# Patient Record
Sex: Male | Born: 1975
Health system: Southern US, Community
[De-identification: ages and names within clinical notes are randomized; demographics above are authoritative.]

## PROBLEM LIST (undated history)

## (undated) DIAGNOSIS — Z803 Family history of malignant neoplasm of breast: Secondary | ICD-10-CM

## (undated) DIAGNOSIS — N35919 Unspecified urethral stricture, male, unspecified site: Secondary | ICD-10-CM

## (undated) DIAGNOSIS — E7800 Pure hypercholesterolemia, unspecified: Secondary | ICD-10-CM

## (undated) DIAGNOSIS — E119 Type 2 diabetes mellitus without complications: Secondary | ICD-10-CM

## (undated) DIAGNOSIS — I10 Essential (primary) hypertension: Secondary | ICD-10-CM

## (undated) DIAGNOSIS — K219 Gastro-esophageal reflux disease without esophagitis: Secondary | ICD-10-CM

## (undated) DIAGNOSIS — Z87442 Personal history of urinary calculi: Secondary | ICD-10-CM

## (undated) HISTORY — PX: WISDOM TOOTH EXTRACTION: SHX21

## (undated) HISTORY — PX: OSTOMY: SHX5997

## (undated) HISTORY — DX: Pure hypercholesterolemia, unspecified: E78.00

## (undated) HISTORY — DX: Essential (primary) hypertension: I10

## (undated) HISTORY — DX: Family history of malignant neoplasm of breast: Z80.3

## (undated) HISTORY — PX: URETHRAL DILATION: SUR417

## (undated) HISTORY — DX: Gastro-esophageal reflux disease without esophagitis: K21.9

---

## 2002-01-16 ENCOUNTER — Emergency Department (HOSPITAL_COMMUNITY): Admission: EM | Admit: 2002-01-16 | Discharge: 2002-01-16 | Payer: Self-pay | Admitting: Internal Medicine

## 2012-03-20 ENCOUNTER — Other Ambulatory Visit: Payer: Self-pay | Admitting: Urology

## 2012-03-27 ENCOUNTER — Encounter (HOSPITAL_BASED_OUTPATIENT_CLINIC_OR_DEPARTMENT_OTHER): Payer: Self-pay | Admitting: *Deleted

## 2012-03-27 NOTE — Progress Notes (Signed)
Pt instructed npo p mn 12 29.  To [facility] 12/30 @ 0615.  Needs hgb on arrival

## 2012-04-06 ENCOUNTER — Encounter (HOSPITAL_BASED_OUTPATIENT_CLINIC_OR_DEPARTMENT_OTHER): Payer: Self-pay | Admitting: *Deleted

## 2012-04-06 ENCOUNTER — Encounter (HOSPITAL_BASED_OUTPATIENT_CLINIC_OR_DEPARTMENT_OTHER): Payer: Self-pay | Admitting: Anesthesiology

## 2012-04-06 ENCOUNTER — Ambulatory Visit (HOSPITAL_BASED_OUTPATIENT_CLINIC_OR_DEPARTMENT_OTHER)
Admission: RE | Admit: 2012-04-06 | Discharge: 2012-04-06 | Disposition: A | Discharge status: Home / Self Care | Payer: BC Managed Care – PPO | Source: Ambulatory Visit | Attending: Urology | Admitting: Urology

## 2012-04-06 ENCOUNTER — Encounter (HOSPITAL_BASED_OUTPATIENT_CLINIC_OR_DEPARTMENT_OTHER)
Admission: RE | Disposition: A | Discharge status: Home / Self Care | Payer: Self-pay | Source: Ambulatory Visit | Attending: Urology

## 2012-04-06 ENCOUNTER — Ambulatory Visit (HOSPITAL_BASED_OUTPATIENT_CLINIC_OR_DEPARTMENT_OTHER): Payer: BC Managed Care – PPO | Admitting: Anesthesiology

## 2012-04-06 DIAGNOSIS — N480 Leukoplakia of penis: Secondary | ICD-10-CM | POA: Insufficient documentation

## 2012-04-06 DIAGNOSIS — N35919 Unspecified urethral stricture, male, unspecified site: Secondary | ICD-10-CM | POA: Insufficient documentation

## 2012-04-06 HISTORY — DX: Unspecified urethral stricture, male, unspecified site: N35.919

## 2012-04-06 HISTORY — PX: BALLOON DILATION: SHX5330

## 2012-04-06 LAB — POCT I-STAT, CHEM 8
Calcium, Ion: 1.29 mmol/L — ABNORMAL HIGH (ref 1.12–1.23)
Creatinine, Ser: 0.9 mg/dL (ref 0.50–1.35)
Glucose, Bld: 118 mg/dL — ABNORMAL HIGH (ref 70–99)
HCT: 47 % (ref 39.0–52.0)
Hemoglobin: 16 g/dL (ref 13.0–17.0)

## 2012-04-06 SURGERY — CYSTOSCOPY WITH RETROGRADE URETHROGRAM
Anesthesia: General | Site: Urethra | Wound class: Clean Contaminated

## 2012-04-06 MED ORDER — FENTANYL CITRATE 0.05 MG/ML IJ SOLN
25.0000 ug | INTRAMUSCULAR | Status: DC | PRN
  Filled 2012-04-06: qty 1

## 2012-04-06 MED ORDER — LIDOCAINE HCL (CARDIAC) 20 MG/ML IV SOLN
INTRAVENOUS | Status: DC | PRN
  Administered 2012-04-06: 80 mg via INTRAVENOUS

## 2012-04-06 MED ORDER — MEPERIDINE HCL 25 MG/ML IJ SOLN
6.2500 mg | INTRAMUSCULAR | Status: DC | PRN
  Filled 2012-04-06: qty 1

## 2012-04-06 MED ORDER — GENTAMICIN SULFATE 40 MG/ML IJ SOLN
440.0000 mg | Freq: Once | INTRAVENOUS | Status: AC
  Administered 2012-04-06: 440 mg via INTRAVENOUS
  Filled 2012-04-06 (×2): qty 11

## 2012-04-06 MED ORDER — LACTATED RINGERS IV SOLN
INTRAVENOUS | Status: DC
Start: 2012-04-06 — End: 2012-04-06
  Administered 2012-04-06: 07:00:00 via INTRAVENOUS
  Filled 2012-04-06: qty 1000

## 2012-04-06 MED ORDER — HYDROCODONE-ACETAMINOPHEN 5-500 MG PO TABS: 1.0000 | ORAL_TABLET | Freq: Four times a day (QID) | ORAL | Status: DC | PRN

## 2012-04-06 MED ORDER — KETOROLAC TROMETHAMINE 30 MG/ML IJ SOLN
INTRAMUSCULAR | Status: DC | PRN
  Administered 2012-04-06: 30 mg via INTRAVENOUS

## 2012-04-06 MED ORDER — DEXAMETHASONE SODIUM PHOSPHATE 4 MG/ML IJ SOLN
INTRAMUSCULAR | Status: DC | PRN
  Administered 2012-04-06: 10 mg via INTRAVENOUS

## 2012-04-06 MED ORDER — MIDAZOLAM HCL 5 MG/5ML IJ SOLN
INTRAMUSCULAR | Status: DC | PRN
  Administered 2012-04-06: 2 mg via INTRAVENOUS

## 2012-04-06 MED ORDER — LACTATED RINGERS IV SOLN
INTRAVENOUS | Status: DC
  Filled 2012-04-06: qty 1000

## 2012-04-06 MED ORDER — FENTANYL CITRATE 0.05 MG/ML IJ SOLN
INTRAMUSCULAR | Status: DC | PRN
  Administered 2012-04-06 (×2): 50 ug via INTRAVENOUS

## 2012-04-06 MED ORDER — ACETAMINOPHEN 10 MG/ML IV SOLN
INTRAVENOUS | Status: DC | PRN
  Administered 2012-04-06: 1000 mg via INTRAVENOUS

## 2012-04-06 MED ORDER — GENTAMICIN IN SALINE 1.6-0.9 MG/ML-% IV SOLN
80.0000 mg | INTRAVENOUS | Status: DC
  Filled 2012-04-06: qty 50

## 2012-04-06 MED ORDER — STERILE WATER FOR IRRIGATION IR SOLN
Status: DC | PRN
  Administered 2012-04-06: 3000 mL via INTRAVESICAL

## 2012-04-06 MED ORDER — CIPROFLOXACIN HCL 250 MG PO TABS: 250.0000 mg | ORAL_TABLET | Freq: Two times a day (BID) | ORAL | Status: DC

## 2012-04-06 MED ORDER — PROPOFOL 10 MG/ML IV BOLUS
INTRAVENOUS | Status: DC | PRN
  Administered 2012-04-06: 200 mg via INTRAVENOUS

## 2012-04-06 MED ORDER — IOHEXOL 350 MG/ML SOLN
INTRAVENOUS | Status: DC | PRN
  Administered 2012-04-06: 20 mL

## 2012-04-06 MED ORDER — ONDANSETRON HCL 4 MG/2ML IJ SOLN
INTRAMUSCULAR | Status: DC | PRN
  Administered 2012-04-06: 4 mg via INTRAVENOUS

## 2012-04-06 MED ORDER — PROMETHAZINE HCL 25 MG/ML IJ SOLN
6.2500 mg | INTRAMUSCULAR | Status: DC | PRN
  Filled 2012-04-06: qty 1

## 2012-04-06 SURGICAL SUPPLY — 24 items
ADAPTER CATH URET PLST 4-6FR (CATHETERS) IMPLANT
ADPR CATH URET STRL DISP 4-6FR (CATHETERS)
BAG DRAIN URO-CYSTO SKYTR STRL (DRAIN) ×3 IMPLANT
BAG DRN UROCATH (DRAIN) ×2
BALLN NEPHROSTOMY (BALLOONS) ×3
BALLOON NEPHROSTOMY (BALLOONS) ×1 IMPLANT
CANISTER SUCT LVC 12 LTR MEDI- (MISCELLANEOUS) ×2 IMPLANT
CATH INTERMIT  6FR 70CM (CATHETERS) ×3 IMPLANT
CATH ROBINSON RED A/P 14FR (CATHETERS) ×3 IMPLANT
CATH URET 5FR 28IN CONE TIP (BALLOONS)
CATH URET 5FR 70CM CONE TIP (BALLOONS) IMPLANT
CLOTH BEACON ORANGE TIMEOUT ST (SAFETY) ×3 IMPLANT
DRAPE CAMERA CLOSED 9X96 (DRAPES) ×3 IMPLANT
GLOVE BIO SURGEON STRL SZ7 (GLOVE) ×2 IMPLANT
GLOVE BIO SURGEON STRL SZ7.5 (GLOVE) ×3 IMPLANT
GLOVE ECLIPSE 7.0 STRL STRAW (GLOVE) ×1 IMPLANT
GLOVE INDICATOR 7.5 STRL GRN (GLOVE) ×2 IMPLANT
GOWN PREVENTION PLUS LG XLONG (DISPOSABLE) ×4 IMPLANT
GOWN STRL REIN XL XLG (GOWN DISPOSABLE) ×3 IMPLANT
GUIDEWIRE STR DUAL SENSOR (WIRE) ×3 IMPLANT
NS IRRIG 500ML POUR BTL (IV SOLUTION) IMPLANT
PACK CYSTOSCOPY (CUSTOM PROCEDURE TRAY) ×3 IMPLANT
SYRINGE IRR TOOMEY STRL 70CC (SYRINGE) IMPLANT
WATER STERILE IRR 3000ML UROMA (IV SOLUTION) ×3 IMPLANT

## 2012-04-06 NOTE — Anesthesia Procedure Notes (Signed)
Procedure Name: LMA Insertion Date/Time: 04/06/2012 7:44 AM Performed by: Maris Berger T Pre-anesthesia Checklist: Patient identified, Emergency Drugs available, Suction available and Patient being monitored Patient Re-evaluated:Patient Re-evaluated prior to inductionOxygen Delivery Method: Circle System Utilized Preoxygenation: Pre-oxygenation with 100% oxygen Intubation Type: IV induction Ventilation: Mask ventilation without difficulty LMA: LMA inserted LMA Size: 5.0 Number of attempts: 1 Airway Equipment and Method: bite block Placement Confirmation: positive ETCO2 Dental Injury: Teeth and Oropharynx as per pre-operative assessment

## 2012-04-06 NOTE — H&P (Signed)
History of Present Illness   Joel Jefferson is a 36 year old gentleman who in the last 4 or 5 days has been having difficulty initiating urination. It hurts after he urinates near the tip of the penis. He is having some spraying of urination. It also hurts to start.  A few months ago, his flow was good and now it is reasonable, but he does spray. He does feel empty. He says normally he drinks a lot of fluids and voids every 2 hours and gets up 3-4 times a night and denies ankle edema. He is continent.   He denies a history of kidney stones, previous GU surgery and urinary tract infections.  He has no neurologic risk factors or symptoms. His bowel function is normal. He was given ciprofloxacin.  I was consulted by Dr. Dwana Melena and he felt he had meatal stenosis.  The symptoms are moderate in severity and persisting. It came on fairly acutely.    Current Meds 1. Anucort-HC 25 MG Rectal Suppository; Therapy: 11Dec2013 to 2. Ciprofloxacin HCl 250 MG Oral Tablet; Therapy: 11Dec2013 to  Allergies Medication  1. No Known Drug Allergies  Family History Problems  1. Maternal history of  Diabetes Mellitus V18.0 2. Maternal history of  Hypertension V17.49  Social History Problems    Alcohol Use   Caffeine Use   Former Smoker V15.82   Marital History - Currently Married   Occupation: EMT  Review of Systems Constitutional, skin, eye, otolaryngeal, hematologic/lymphatic, cardiovascular, pulmonary, endocrine, musculoskeletal, gastrointestinal, neurological and psychiatric system(s) were reviewed and pertinent findings if present are noted.  Genitourinary: initiating urination requires straining.    Vitals Vital Signs [Data Includes: Last 1 Day]  13Dec2013 09:21AM  BMI Calculated: 34.36 BSA Calculated: 2.26 Height: 5 ft 10 in Weight: 240 lb  Blood Pressure: 149 / 101 Heart Rate: 87 Respiration: 16  Physical Exam Constitutional: Well nourished and well developed . No  acute distress.  ENT:. The ears and nose are normal in appearance.  Neck: The appearance of the neck is normal and no neck mass is present.  Pulmonary: No respiratory distress and normal respiratory rhythm and effort.  Cardiovascular: Heart rate and rhythm are normal . No peripheral edema.  Abdomen: The abdomen is soft and nontender. No masses are palpated. No CVA tenderness. No hernias are palpable. No hepatosplenomegaly noted.  Lymphatics: The femoral and inguinal nodes are not enlarged or tender.  Skin: Normal skin turgor, no visible rash and no visible skin lesions.  Neuro/Psych:. Mood and affect are appropriate.   . Genitourinary: On physical examination, Mr. Zeitz was circumcised.  He had meatal stenosis.  He had whitish tissue around the urethral meatus that he says has been present for 3-4 months.    Results/Data  20 Mar 2012 8:58 AM   UA With REFLEX       COLOR YELLOW       APPEARANCE CLEAR       SPECIFIC GRAVITY 1.020       pH 6.5       GLUCOSE NEG       BILIRUBIN NEG       KETONE NEG       BLOOD NEG       PROTEIN NEG       UROBILINOGEN 0.2       NITRITE NEG       LEUKOCYTE ESTERASE NEG     Today Mr. Thorpe underwent a number of tests, which I personally reviewed.   Urinalysis:  Negative.  Bladder Scan: Bladder scan residual was 36 mL.    Assessment Assessed  1. Urethral Stricture 598.9 2. Urinary Stream Is Smaller 788.62  Plan   Discussion/Summary   I agree with Dr. Margo Aye that Mr. Hawkes has symptomatic meatal stenosis. This probably is an acute on-chronic process. He may have a distal urethral stricture proximal to the meatal stenosis. His nocturia is out of the ordinary for his age, but he does drink a lot of fluids. 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  I drew him a picture. I have recommended a cystoscopy and a retrograde using a 6 [place] open end urethral catheter. I will then perform balloon dilation.  We talked about balloon dilation in detail.  Pros, cons, general surgical and anesthetic risks, and other options including watchful waiting were discussed. He understands that dilation is generally successful in most cases but long-term success rates are low. We talked about the risk of persistent, de novo, or worsening incontinence and voiding dysfunction. Risks were described but not limited to the discussion of injury to neighboring structures and soft tissues. Bleeding, infection, pain, erectile dysfunction, and spraying of urination were discussed. The risk of neuropathy was discussed as well as the usual post-operative course.  Splitting of the glans at the meatus was discussed and long-term spraying of urination was discussed.  I am going to send a copy of my note to Dr. Dwana Melena to keep him updated on his treatment course.  cc: Dwana Melena, MD   I will do my best to leave Mr. Poland without a catheter. We talked about the natural history of strictures. In the long term, I will talk about prevention. He works 24-hour shifts. If he had a catheter, he likely would not be able to work for 1-2 days afterwards.   I am going to send a copy of my note to Dr. Dwana Melena to keep him updated on his treatment course.  After a thorough review of the management options for the patient's condition the patient  elected to proceed with surgical therapy as noted above. We have discussed the potential benefits and risks of the procedure, side effects of the proposed treatment, the likelihood of the patient achieving the goals of the procedure, and any potential problems that might occur during the procedure or recuperation. Informed consent has been obtained.

## 2012-04-06 NOTE — Anesthesia Preprocedure Evaluation (Signed)
Anesthesia Evaluation  Patient identified by MRN, date of birth, ID band Patient awake    Reviewed: Allergy & Precautions, H&P , NPO status , Patient's Chart, lab work & pertinent test results  Airway Mallampati: II TM Distance: >3 FB Neck ROM: full    Dental No notable dental hx.    Pulmonary neg pulmonary ROS,  breath sounds clear to auscultation  Pulmonary exam normal       Cardiovascular Exercise Tolerance: Good negative cardio ROS  Rhythm:regular Rate:Normal     Neuro/Psych negative neurological ROS  negative psych ROS   GI/Hepatic negative GI ROS, Neg liver ROS,   Endo/Other  negative endocrine ROS  Renal/GU negative Renal ROS  negative genitourinary   Musculoskeletal   Abdominal   Peds  Hematology negative hematology ROS (+)   Anesthesia Other Findings   Reproductive/Obstetrics negative OB ROS                           Anesthesia Physical Anesthesia Plan  ASA: I  Anesthesia Plan: General LMA   Post-op Pain Management:    Induction:   Airway Management Planned:   Additional Equipment:   Intra-op Plan:   Post-operative Plan:   Informed Consent: I have reviewed the patients History and Physical, chart, labs and discussed the procedure including the risks, benefits and alternatives for the proposed anesthesia with the patient or authorized representative who has indicated his/her understanding and acceptance.   Dental Advisory Given  Plan Discussed with: CRNA  Anesthesia Plan Comments:         Anesthesia Quick Evaluation

## 2012-04-06 NOTE — Op Note (Signed)
Preoperative diagnosis: Meatal stenosis Postoperative diagnosis: Meatal stenosis and mild BXO Surgery: Cystoscopy retrograde urethrogram and balloon dilation of stricture Surgeon: Dr. Lorin Picket Darlina Mccaughey  The patient consented the above procedure and preoperative antibiotics. He had approximate 8 French meatal stenosis with a small rim of white tissue in keeping with BXO around the meatus  I initially did a retrograde urethrogram with a well-lubricated a one-inch 6 French ureteral catheter.  Retrograde ureterogram it was normal caliber he penile and bulbar urethra I injected approximately 10 cc of contrast with minimal reaching the bladder through the external sphincter. It was done in the AP position and lithotomy position with the penis drawn to the patient's left side there was no extravasation or urethral stricture  Under fluoroscopic guidance I easily passed a sensor wire curling into the bladder. I passed a well-prepared balloon dilation catheter to the mid urethra and dilated for 5 minutes at 18 atmospheres of pressure with the usual technique  The balloon was deflated and removed  With a 17 [place] scope I easily entered the bladder. Bladder mucosa and trigone were normal. Is no stitch or foreign body or carcinoma. One could argue he may have had a little bit of a high riding bladder neck no was minimal.  The prosthetic urethra and membranous urethra were normal. Bulbar urethra normal. Penile urethra was normal with no stricture disease. The meatus was wide open. There may have been 1 or 2 mm of BXO migrating into the urethra distally.  There was no distortion or splitting of the meatus.  There was no bleeding. Patient was left without a catheter. Hopefully the procedure will reach the patient's treatment goal

## 2012-04-06 NOTE — Transfer of Care (Signed)
Immediate Anesthesia Transfer of Care Note  Patient: Joel Jefferson  Procedure(s) Performed: Procedure(s) (LRB) with comments: CYSTOSCOPY WITH RETROGRADE URETHROGRAM (N/A) BALLOON DILATION (N/A)  Patient Location: PACU  Anesthesia Type:General  Level of Consciousness: awake, alert  and oriented  Airway & Oxygen Therapy: Patient Spontanous Breathing and Patient connected to nasal cannula oxygen  Post-op Assessment: Report given to PACU RN  Post vital signs: Reviewed and stable  Complications: No apparent anesthesia complications

## 2012-04-06 NOTE — Progress Notes (Signed)
Dr Sherron Monday in to see pt as ordered prior to pt D/C

## 2012-04-06 NOTE — Progress Notes (Signed)
Dr Sherron Monday paged to see pt prior to D/C

## 2012-04-06 NOTE — Interval H&P Note (Signed)
History and Physical Interval Note:  04/06/2012 7:33 AM  Joel Jefferson  has presented today for surgery, with the diagnosis of URETHERAL STRICTURE  The various methods of treatment have been discussed with the patient and family. After consideration of risks, benefits and other options for treatment, the patient has consented to  Procedure(s) (LRB) with comments: CYSTOSCOPY WITH RETROGRADE URETHROGRAM (N/A) BALLOON DILATION (N/A) as a surgical intervention .  The patient's history has been reviewed, patient examined, no change in status, stable for surgery.  I have reviewed the patient's chart and labs.  Questions were answered to the patient's satisfaction.     Dwayne Begay A

## 2012-04-06 NOTE — Anesthesia Postprocedure Evaluation (Signed)
  Anesthesia Post-op Note  Patient: Joel Jefferson  Procedure(s) Performed: Procedure(s) (LRB): CYSTOSCOPY WITH RETROGRADE URETHROGRAM (N/A) BALLOON DILATION (N/A)  Patient Location: PACU  Anesthesia Type: General  Level of Consciousness: awake and alert   Airway and Oxygen Therapy: Patient Spontanous Breathing  Post-op Pain: mild  Post-op Assessment: Post-op Vital signs reviewed, Patient's Cardiovascular Status Stable, Respiratory Function Stable, Patent Airway and No signs of Nausea or vomiting  Last Vitals:  Filed Vitals:   04/06/12 0845  BP: 131/89  Pulse: 80  Temp:   Resp: 14    Post-op Vital Signs: stable   Complications: No apparent anesthesia complications

## 2012-04-07 ENCOUNTER — Encounter (HOSPITAL_BASED_OUTPATIENT_CLINIC_OR_DEPARTMENT_OTHER): Payer: Self-pay | Admitting: Urology

## 2012-10-26 ENCOUNTER — Ambulatory Visit (INDEPENDENT_AMBULATORY_CARE_PROVIDER_SITE_OTHER): Payer: BC Managed Care – PPO | Admitting: Psychology

## 2012-10-26 DIAGNOSIS — F411 Generalized anxiety disorder: Secondary | ICD-10-CM

## 2012-11-12 ENCOUNTER — Encounter (HOSPITAL_COMMUNITY): Payer: Self-pay | Admitting: Psychology

## 2012-11-12 ENCOUNTER — Ambulatory Visit (INDEPENDENT_AMBULATORY_CARE_PROVIDER_SITE_OTHER): Payer: BC Managed Care – PPO | Admitting: Psychology

## 2012-11-12 DIAGNOSIS — F411 Generalized anxiety disorder: Secondary | ICD-10-CM

## 2012-11-12 NOTE — Progress Notes (Signed)
Patient:   Joel Jefferson.   DOB:   10/09/1975  MR Number:  [id number]  Location:  BEHAVIORAL [facility] [address] [place] [address] Dept: [phone]           Date of Service:   10/26/2012  Start Time:   3 PM End Time:   4 PM  Provider/Observer:  Hershal Coria PSYD       Billing Code/Service: [phone]  Chief Complaint:     Chief Complaint  Patient presents with  . Stress  . Agitation    Reason for Service:  The patient reports that he is feeling more anger and stress issues lately. The patient reports that he is major stressors right now have to do with his mothers help her situation and difficulty within his marriage. The patient reports he has had difficulty with anger and stress and mood changes for years and anxiety maybe a major issue with him. The patient has had some obsessive-compulsive symptoms in the past as well as excessive worrying. The patient reports that more recently as a stress is increased that there've been increasing marital difficulties to the point that he and his wife is separated. The patient reports he is always had trouble with anger. The patient reports that he is/and at a friend's which caused a lot of difficulty and this is been worsening over the past 6 months. 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 The oldest is a 37 year old male with ADHD and has been in trouble with the law causes a also a lot of stress between the patient and his wife.  Current Status:  The patient has been feeling more stressed anger recently and this is led to a separation from he and his wife. He is not sure whether this marriage will survive the situation. The patient reports that obsessive-compulsive symptoms and also been present in the past  and he has had difficulty with anxiety and worry for a long time but over the past 6 or 7 months symptoms of worse.  Reliability of Information: Information provided by the patient as well as review of available medical records.  Behavioral Observation: JUANDIEGO KOLENOVIC.  presents as a 37 y.o.-year-old Right Caucasian Male who appeared his stated age. his dress was Appropriate and he was Well Groomed and his manners were Appropriate to the situation.  There were not any physical disabilities noted.  he displayed an appropriate level of cooperation and motivation.    Interactions:    Active   Attention:   within normal limits  Memory:   within normal limits  Visuo-spatial:   within normal limits  Speech (Volume):  normal  Speech:   normal pitch and normal volume  Thought Process:  Coherent  Though Content:  WNL  Orientation:   person, place, time/date and situation  Judgment:   Good  Planning:   Good  Affect:    Irritable  Mood:    Anxious  Insight:   Good  Intelligence:   normal  Marital Status/Living: The patient was born and raised in [place]. He reports he grew up in a traditional family were his mother didn't primary racing of the patient because his father was always working. He  has a 46 year old sister. His father is 67 years old and in fair health and his mother is 24 years old and in poor health. He does see them often. They're no major issues with family members. The patient does have military experience and was in the [employer] between 1995 and 1999. He never saw active combat. After he received his honorable discharge he got married and started his family. 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 The patient is in his second marriage and has 3 children all males age 37, 91, and 51.  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  Substance Use:  No concerns of substance abuse are reported.    Education:   HS Graduate  Medical History:   Past Medical History  Diagnosis Date  . Urethral stricture         Outpatient Encounter Prescriptions as of 10/26/2012  Medication Sig Dispense Refill  . ciprofloxacin (CIPRO) 250 MG tablet Take 1 tablet (250 mg total) by mouth 2 (two) times daily.  6 tablet  0  . HYDROcodone-acetaminophen (VICODIN) 5-500 MG per tablet Take 1-2 tablets by mouth every 6 (six) hours as needed for pain.  30 tablet  0   No facility-administered encounter medications on file as of 10/26/2012.        The patient has also had training as an EMT through [employer].  Sexual History:   History  Sexual Activity  . Sexually Active:     Abuse/Trauma History: The patient denies any history of traumatic experiences.  Psychiatric History:  The patient acknowledges fairly long history of some issues with anxiety but never received any care for this. He does have some OCD traits and difficulty shifting his focus from things physically there upsetting him.  Family Med/Psych History: No family history on file.  Risk of Suicide/Violence: virtually non-existent   Impression/DX:  At this point, the patient clearly has coping skills for lack of coping skills and set him up for some difficulties. Patient has described some difficulties with anxiety and worry in the past.  Disposition/Plan:  We will set the patient for individual psychotherapeutic interventions to better manage this difficult stressful time for him.  Diagnosis:    Axis I:  Anxiety state, unspecified

## 2012-11-12 NOTE — Progress Notes (Signed)
Patient:  Joel Jefferson.   DOB: [birth year]  MR Number: [id number]  Location: BEHAVIORAL [facility] [address] [place] [address] Dept: [phone]  Start: 11 AM End: 12 PM  Provider/Observer:     Hershal Coria PSYD  Chief Complaint:      Chief Complaint  Patient presents with  . Anxiety  . Stress    Reason For Service:     The patient reports that he is feeling more anger and stress issues lately. The patient reports that he is major stressors right now have to do with his mothers help her situation and difficulty within his marriage. The patient reports he has had difficulty with anger and stress and mood changes for years and anxiety maybe a major issue with him. The patient has had some obsessive-compulsive symptoms in the past as well as excessive worrying. The patient reports that more recently as a stress is increased that there've been increasing marital difficulties to the point that he and his wife is separated. The patient reports he is always had trouble with anger. The patient reports that he is/and at a friend's which caused a lot of difficulty and this is been worsening over the past 6 months. 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 The oldest is a 37 year old male with ADHD and has been in trouble with the law causes a also a lot of stress between the patient and his wife.   Interventions Strategy:  Cognitive/behavioral psychotherapeutic interventions  Participation Level:   Active  Participation Quality:  Appropriate      Behavioral Observation:  Well Groomed, Alert, and Appropriate.   Current Psychosocial Factors: The patient reports it does look like he and his wife separation is going to be a permanent one. He has had some  indirect conversations with her and she has made no indication that she would like to try to reconcile. While the patient reports that this is something he does not want to happen he understands that he will have to adjust to this that she does not want to be married to him in the long run this will be the best course for him.  Content of Session:   Review current symptoms and continued work on therapeutic interventions.  Current Status:   The patient reports that his anxiety has been improved as he has developed some acceptance and stability with his life situation.  Patient Progress:   Good  Target Goals:   Target goals include reducing the intensity, frequency, and duration of significant anxiety symptoms.  Last Reviewed:   11/12/2012  Goals Addressed Today:    Today we worked on cognitive/behavioral psychotherapeutic interventions are in issues of anxiety and stress and working on improved coping skills.  Impression/Diagnosis:   At this point, the patient clearly has coping skills for lack of coping skills and set him up for some difficulties. Patient has described some difficulties with anxiety and worry in the past.   Diagnosis:    Axis I: Anxiety state, unspecified

## 2012-12-02 ENCOUNTER — Ambulatory Visit (INDEPENDENT_AMBULATORY_CARE_PROVIDER_SITE_OTHER): Payer: BC Managed Care – PPO | Admitting: Psychology

## 2012-12-02 DIAGNOSIS — F411 Generalized anxiety disorder: Secondary | ICD-10-CM

## 2013-01-21 ENCOUNTER — Encounter (HOSPITAL_COMMUNITY): Payer: Self-pay | Admitting: Psychology

## 2013-01-21 NOTE — Progress Notes (Signed)
Patient:  Joel Jefferson.   DOB: 06-21-1975  MR Number: [id number]  Location: BEHAVIORAL [facility] [address] [place] [address] Dept: [phone]  Start: 10 AM End: 11 AM  Provider/Observer:     Hershal Coria PSYD  Chief Complaint:      Chief Complaint  Patient presents with  . Anxiety    Reason For Service:     The patient reports that he is feeling more anger and stress issues lately. The patient reports that he is major stressors right now have to do with his mothers help her situation and difficulty within his marriage. The patient reports he has had difficulty with anger and stress and mood changes for years and anxiety maybe a major issue with him. The patient has had some obsessive-compulsive symptoms in the past as well as excessive worrying. The patient reports that more recently as a stress is increased that there've been increasing marital difficulties to the point that he and his wife is separated. The patient reports he is always had trouble with anger. The patient reports that he is/and at a friend's which caused a lot of difficulty and this is been worsening over the past 6 months. 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 The oldest is a 37 year old male with ADHD and has been in trouble with the law causes a also a lot of stress between the patient and his wife.   Interventions Strategy:  Cognitive/behavioral psychotherapeutic interventions  Participation Level:   Active  Participation Quality:  Appropriate      Behavioral Observation:  Well Groomed, Alert, and Appropriate.   Current Psychosocial Factors: The patient reports that he is working on adjusting to what appears to be a permanent separation with he and his wife. He is been  actively working on coping skills to better deal with his anxiety.  Content of Session:   Review current symptoms and continued work on therapeutic interventions.  Current Status:   The patient reports that his anxiety has been improved as he has developed some acceptance and stability with his life situation.  Patient Progress:   Good  Target Goals:   Target goals include reducing the intensity, frequency, and duration of significant anxiety symptoms.  Last Reviewed:   12/02/2012  Goals Addressed Today:    Today we worked on cognitive/behavioral psychotherapeutic interventions are in issues of anxiety and stress and working on improved coping skills.  Impression/Diagnosis:   At this point, the patient clearly has coping skills for lack of coping skills and set him up for some difficulties. Patient has described some difficulties with anxiety and worry in the past.   Diagnosis:    Axis I: Anxiety state, unspecified

## 2013-02-04 ENCOUNTER — Ambulatory Visit (INDEPENDENT_AMBULATORY_CARE_PROVIDER_SITE_OTHER): Payer: BC Managed Care – PPO | Admitting: Psychology

## 2013-02-04 DIAGNOSIS — F411 Generalized anxiety disorder: Secondary | ICD-10-CM

## 2013-02-05 ENCOUNTER — Encounter (HOSPITAL_COMMUNITY): Payer: Self-pay | Admitting: Psychology

## 2013-02-05 NOTE — Progress Notes (Signed)
Patient:  Joel Jefferson.   DOB: 1976-03-05  MR Number: [id number]  Location: BEHAVIORAL [facility] [address] [place] [address] Dept: [phone]  Start: 10 AM End: 11 AM  Provider/Observer:     Hershal Coria PSYD  Chief Complaint:      Chief Complaint  Patient presents with  . Anxiety    Reason For Service:     The patient reports that he is feeling more anger and stress issues lately. The patient reports that he is major stressors right now have to do with his mothers help her situation and difficulty within his marriage. The patient reports he has had difficulty with anger and stress and mood changes for years and anxiety maybe a major issue with him. The patient has had some obsessive-compulsive symptoms in the past as well as excessive worrying. The patient reports that more recently as a stress is increased that there've been increasing marital difficulties to the point that he and his wife is separated. The patient reports he is always had trouble with anger. The patient reports that he is/and at a friend's which caused a lot of difficulty and this is been worsening over the past 6 months. 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 The oldest is a 37 year old male with ADHD and has been in trouble with the law causes a also a lot of stress between the patient and his wife.   Interventions Strategy:  Cognitive/behavioral psychotherapeutic interventions  Participation Level:   Active  Participation Quality:  Appropriate      Behavioral Observation:  Well Groomed, Alert, and Appropriate.   Current Psychosocial Factors: The patient reports that he has been doing much better which have resulted in him feeling more comfortable going out and engaging  with others. He reports the situation between he and his wife has not improved but that he has been coping better with the situation. He reports that they're still together and that they're continuing to work on figuring out the direction for their marriage  Content of Session:   Review current symptoms and continued work on therapeutic interventions.  Current Status:   The patient reports significant improvement in his anxiety level and his coping with life situations.  Patient Progress:   Good  Target Goals:   Target goals include reducing the intensity, frequency, and duration of significant anxiety symptoms.  Last Reviewed:   02/04/2013  Goals Addressed Today:    Today we worked on cognitive/behavioral psychotherapeutic interventions are in issues of anxiety and stress and working on improved coping skills.  Impression/Diagnosis:   At this point, the patient clearly has coping skills for lack of coping skills and set him up for some difficulties. Patient has described some difficulties with anxiety and worry in the past.   Diagnosis:    Axis I: Anxiety state, unspecified

## 2013-02-25 ENCOUNTER — Ambulatory Visit (INDEPENDENT_AMBULATORY_CARE_PROVIDER_SITE_OTHER): Payer: BC Managed Care – PPO | Admitting: Psychology

## 2013-02-25 ENCOUNTER — Encounter (HOSPITAL_COMMUNITY): Payer: Self-pay | Admitting: Psychology

## 2013-02-25 DIAGNOSIS — F411 Generalized anxiety disorder: Secondary | ICD-10-CM

## 2013-02-25 NOTE — Progress Notes (Signed)
Patient:  Joel Jefferson.   DOB: October 02, 1975  MR Number: [id number]  Location: BEHAVIORAL [facility] [address] [place] [address] Dept: [phone]  Start: 9 AM End: 10 AM  Provider/Observer:     Hershal Coria PSYD  Chief Complaint:      Chief Complaint  Patient presents with  . Anxiety    Reason For Service:     The patient reports that he is feeling more anger and stress issues lately. The patient reports that he is major stressors right now have to do with his mothers help her situation and difficulty within his marriage. The patient reports he has had difficulty with anger and stress and mood changes for years and anxiety maybe a major issue with him. The patient has had some obsessive-compulsive symptoms in the past as well as excessive worrying. The patient reports that more recently as a stress is increased that there've been increasing marital difficulties to the point that he and his wife is separated. The patient reports he is always had trouble with anger. The patient reports that he is/and at a friend's which caused a lot of difficulty and this is been worsening over the past 6 months. 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 The oldest is a 37 year old male with ADHD and has been in trouble with the law causes a also a lot of stress between the patient and his wife.   Interventions Strategy:  Cognitive/behavioral psychotherapeutic interventions  Participation Level:   Active  Participation Quality:  Appropriate      Behavioral Observation:  Well Groomed, Alert, and Appropriate.   Current Psychosocial Factors: The patient reports that he has had more struggles with his middle son, who has tried to triangulate he and his estrainged wife.   He has struggled with asking his son to do things around his house and the son gets mad and demands to go to his mom's house and she lets him.  Then she demands and other times that the patient take the son back when he does the same thing at her house.  Worked on these issues with building coping skills.  Content of Session:   Review current symptoms and continued work on therapeutic interventions.  Current Status:   The patient reports significant improvement in his anxiety level and his coping with life situations.  Patient Progress:   Good  Target Goals:   Target goals include reducing the intensity, frequency, and duration of significant anxiety symptoms.  Last Reviewed:   02/25/2013  Goals Addressed Today:    Today we worked on cognitive/behavioral psychotherapeutic interventions are in issues of anxiety and stress and working on improved coping skills.  Impression/Diagnosis:   At this point, the patient clearly has coping skills for lack of coping skills and set him up for some difficulties. Patient has described some difficulties with anxiety and worry in the past.   Diagnosis:    Axis I: Anxiety state, unspecified

## 2013-03-24 ENCOUNTER — Ambulatory Visit (INDEPENDENT_AMBULATORY_CARE_PROVIDER_SITE_OTHER): Payer: BC Managed Care – PPO | Admitting: Psychology

## 2013-03-24 DIAGNOSIS — F411 Generalized anxiety disorder: Secondary | ICD-10-CM

## 2013-03-26 ENCOUNTER — Encounter (HOSPITAL_COMMUNITY): Payer: Self-pay | Admitting: Psychology

## 2013-03-26 NOTE — Progress Notes (Signed)
Patient:  Joel Jefferson.   DOB: Jul 06, 1975  MR Number: [id number]  Location: [facility] [address] [place] [address] Dept: [phone]  Start: 9 AM End: 10 AM  Provider/Observer:     Hershal Coria PSYD  Chief Complaint:      Chief Complaint  Patient presents with  . Anxiety    Reason For Service:     The patient reports that he is feeling more anger and stress issues lately. The patient reports that he is major stressors right now have to do with his mothers help her situation and difficulty within his marriage. The patient reports he has had difficulty with anger and stress and mood changes for years and anxiety maybe a major issue with him. The patient has had some obsessive-compulsive symptoms in the past as well as excessive worrying. The patient reports that more recently as a stress is increased that there've been increasing marital difficulties to the point that he and his wife is separated. The patient reports he is always had trouble with anger. The patient reports that he is/and at a friend's which caused a lot of difficulty and this is been worsening over the past 6 months. 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 The oldest is a 37 year old male with ADHD and has been in trouble with the law causes a also a lot of stress between the patient and his wife.   Interventions Strategy:  Cognitive/behavioral psychotherapeutic interventions  Participation Level:   Active  Participation Quality:  Appropriate      Behavioral Observation:  Well Groomed, Alert, and Appropriate.   Current Psychosocial Factors: The patient reports that he has been able to talk some with his soon-to-be ex-wife. He reports that he is fully expected him to  follow through with the divorce. The patient reports that there have been doing a little bit better working together regarding her son who has been manipulative recently and frustrated about her separation. The patient reports that he has been working on coping better.  Content of Session:   Review current symptoms and continued work on therapeutic interventions.  Current Status:   The patient reports significant improvement in his anxiety level and his coping with life situations.  Patient Progress:   Good  Target Goals:   Target goals include reducing the intensity, frequency, and duration of significant anxiety symptoms.  Last Reviewed:   1217/2014  Goals Addressed Today:    Today we worked on cognitive/behavioral psychotherapeutic interventions are in issues of anxiety and stress and working on improved coping skills.  Impression/Diagnosis:   At this point, the patient clearly has coping skills for lack of coping skills and set him up for some difficulties. Patient has described some difficulties with anxiety and worry in the past.   Diagnosis:    Axis I: Anxiety state, unspecified

## 2013-05-19 ENCOUNTER — Ambulatory Visit (HOSPITAL_COMMUNITY): Payer: Self-pay | Admitting: Psychology

## 2013-11-16 ENCOUNTER — Other Ambulatory Visit (HOSPITAL_COMMUNITY): Payer: Self-pay | Admitting: Internal Medicine

## 2013-11-16 DIAGNOSIS — R945 Abnormal results of liver function studies: Secondary | ICD-10-CM

## 2013-11-22 ENCOUNTER — Ambulatory Visit (HOSPITAL_COMMUNITY)
Admission: RE | Admit: 2013-11-22 | Discharge: 2013-11-22 | Disposition: A | Discharge status: Home / Self Care | Payer: BC Managed Care – PPO | Source: Ambulatory Visit | Attending: Internal Medicine | Admitting: Internal Medicine

## 2013-11-22 DIAGNOSIS — R7989 Other specified abnormal findings of blood chemistry: Secondary | ICD-10-CM | POA: Insufficient documentation

## 2013-11-22 DIAGNOSIS — R945 Abnormal results of liver function studies: Secondary | ICD-10-CM

## 2013-11-22 DIAGNOSIS — K7689 Other specified diseases of liver: Secondary | ICD-10-CM | POA: Insufficient documentation

## 2014-10-13 ENCOUNTER — Encounter: Payer: Self-pay | Admitting: Nurse Practitioner

## 2014-10-13 ENCOUNTER — Ambulatory Visit (INDEPENDENT_AMBULATORY_CARE_PROVIDER_SITE_OTHER): Payer: Self-pay | Admitting: Nurse Practitioner

## 2014-10-13 VITALS — BP 137/87 | HR 106 | Temp 98.5°F | Ht 70.0 in | Wt 223.8 lb

## 2014-10-13 DIAGNOSIS — K219 Gastro-esophageal reflux disease without esophagitis: Secondary | ICD-10-CM

## 2014-10-13 DIAGNOSIS — R1310 Dysphagia, unspecified: Secondary | ICD-10-CM

## 2014-10-13 NOTE — Progress Notes (Signed)
CC'ED TO PCP 

## 2014-10-13 NOTE — Patient Instructions (Signed)
1. We will attempt check insurance coverage for a endoscopy here. Please let us know if he would like to proceed with the procedure here reviewed like to go to the [place]. 2. Follow-up in 3 months for further evaluation.

## 2014-10-13 NOTE — Assessment & Plan Note (Signed)
GERD symptoms are symptomatically well controlled with over-the-counter Prilosec. However there is a possibility of continued reflux related esophagitis which could be contributing to his symptoms. Continue over-the-counter PPI therapy. Plan for endoscopy to further evaluate symptoms. The patient would like to check with the [place] and see if they will cover an endoscopy here or if he will have to go to the [place]. He will get back to [place] with this information and his decision whether or not to proceed with endoscopy here.  Due to the potential need for endoscopy I discussed the risks and benefits of the procedure and the patient understands these. His procedure would be at Dr. Oneida Alar. He is not on any anticoagulants, anxiolytics, chronic pain medicines, or antidepressants. He does drink 4-5 beers a day because of this he would likely need propofol with MAC.

## 2014-10-13 NOTE — Progress Notes (Signed)
Primary Care Physician:  Rocky Morel, MD Primary Gastroenterologist:  Dr. Oneida Alar  Chief Complaint  Patient presents with  . Dysphagia    solids    HPI:   39 year old male presents on referral from PCP for 1 year history of solid food dysphagia. PCP note reviewed. Per note, patient has GERD history and is on Omeprazole which has helped his GERD symptoms, but not his dysphagia symptoms. No EGD or colonoscopies found in the system nor were any external reports provided by the PCP with the office notes.  Today he states he has had solid food dysphagia for 1+ years, feels like food gets stuck and won't go down but wont come back up. Will occasionally have issues with liquids. Food will eventually pass with time. Denies regurgitation. Also has a discomfort "but not really a pain" with each episode. Denies N/V, hematochezia, melena. His GERD symptoms are well controlled with Prilosec OTC. Denies having prior EGD or TCS. Denies chest pain, dyspnea, dizziness, lightheadedness, syncope, near syncope. Denies any other upper or lower GI symptoms.  Past Medical History  Diagnosis Date  . Urethral stricture     Past Surgical History  Procedure Laterality Date  . Wisdom tooth extraction    . Balloon dilation  04/06/2012    Procedure: BALLOON DILATION;  Surgeon: Reece Packer, MD;  Location: [facility];  Service: Urology;  Laterality: N/A;    Current Outpatient Prescriptions  Medication Sig Dispense Refill  . ciprofloxacin (CIPRO) 250 MG tablet Take 1 tablet (250 mg total) by mouth 2 (two) times daily. 6 tablet 0  . HYDROcodone-acetaminophen (VICODIN) 5-500 MG per tablet Take 1-2 tablets by mouth every 6 (six) hours as needed for pain. 30 tablet 0  . INVOKAMET 50-500 MG TABS TK 1 T PO D  0  . lisinopril (PRINIVIL,ZESTRIL) 40 MG tablet TK 1 T PO D  5  . Magnesium 500 MG CAPS Take by mouth.    Marland Kitchen omeprazole (PRILOSEC) 20 MG capsule Take 20 mg by mouth daily.    .  Potassium (POTASSIMIN PO) Take 550 mg by mouth daily.    . pravastatin (PRAVACHOL) 20 MG tablet TK 1 T PO D  1   No current facility-administered medications for this visit.    Allergies as of 10/13/2014  . (No Known Allergies)    No family history on file.  History   Social History  . Marital Status: Legally Separated    Spouse Name: N/A  . Number of Children: N/A  . Years of Education: N/A   Occupational History  . Not on file.   Social History Main Topics  . Smoking status: Former Smoker    Quit date: 07/27/2011  . Smokeless tobacco: Not on file  . Alcohol Use: 3.6 oz/week    6 Cans of beer per week  . Drug Use: Not on file  . Sexual Activity: Not on file   Other Topics Concern  . Not on file   Social History Narrative    Review of Systems: 10 point ROS negative except as per HPI.    Physical Exam: BP 137/87 mmHg  Pulse 106  Temp(Src) 98.5 F (36.9 C)  Ht 5\' 10"  (1.778 m)  Wt 223 lb 12.8 oz (101.515 kg)  BMI 32.11 kg/m2 General:   Alert and oriented. Pleasant and cooperative. Well-nourished and well-developed.  Head:  Normocephalic and atraumatic. Eyes:  Without icterus, sclera clear and conjunctiva pink.  Ears:  Normal  auditory acuity. Cardiovascular:  S1, S2 present without murmurs appreciated. Normal DP pulses noted. Extremities without clubbing or edema. Respiratory:  Clear to auscultation bilaterally. No wheezes, rales, or rhonchi. No distress.  Gastrointestinal:  +BS, rounded, soft, non-tender and non-distended. No HSM noted. No guarding or rebound. No masses appreciated.  Rectal:  Deferred  Musculoskalatal:  Symmetrical without gross deformities. Normal posture. Skin:  Intact without significant lesions or rashes. Neurologic:  Alert and oriented x4;  grossly normal neurologically. Psych:  Alert and cooperative. Normal mood and affect. Heme/Lymph/Immune: No excessive bruising noted.    10/13/2014 10:57 AM

## 2014-10-13 NOTE — Assessment & Plan Note (Signed)
Patient with 1+ year symptoms of solid food dysphagia as well as occasional pill dysphagia. Denies regurgitation. Denies any other red flag/warning signs or symptoms. Possible etiologies include reflux associated esophagitis but cannot exclude stricture, web, or ring. Plan for endoscopy to further evaluate symptoms. The patient would like to check with the [facility] and see if they will cover an endoscopy here or if he will have to go to the [place]. He will get back to [place] with this information and his decision whether or not to proceed with endoscopy here.  Due to the potential need for endoscopy I discussed the risks and benefits of the procedure and the patient understands these. His procedure would be at Dr. Oneida Alar. He is not on any anticoagulants, anxiolytics, chronic pain medicines, or antidepressants. He does drink 4-5 beers a day because of this he would likely need propofol with MAC.

## 2014-12-19 ENCOUNTER — Encounter: Payer: Self-pay | Admitting: Gastroenterology

## 2015-04-05 ENCOUNTER — Emergency Department (HOSPITAL_COMMUNITY)
Admission: EM | Admit: 2015-04-05 | Discharge: 2015-04-05 | Disposition: A | Discharge status: Home / Self Care | Payer: BLUE CROSS/BLUE SHIELD | Source: Home / Self Care | Attending: Emergency Medicine | Admitting: Emergency Medicine

## 2015-04-05 DIAGNOSIS — J018 Other acute sinusitis: Secondary | ICD-10-CM | POA: Diagnosis not present

## 2015-04-05 DIAGNOSIS — J40 Bronchitis, not specified as acute or chronic: Secondary | ICD-10-CM

## 2015-04-05 MED ORDER — AZITHROMYCIN 250 MG PO TABS
ORAL_TABLET | ORAL | Status: DC
Start: 2015-04-05 — End: 2015-08-09

## 2015-04-05 MED ORDER — ALBUTEROL SULFATE HFA 108 (90 BASE) MCG/ACT IN AERS: 2.0000 | INHALATION_SPRAY | RESPIRATORY_TRACT | Status: DC | PRN

## 2015-04-05 MED ORDER — PREDNISONE 50 MG PO TABS
ORAL_TABLET | ORAL | Status: DC
Start: 2015-04-05 — End: 2015-08-09

## 2015-04-05 MED ORDER — HYDROCODONE-HOMATROPINE 5-1.5 MG/5ML PO SYRP: 5.0000 mL | ORAL_SOLUTION | Freq: Four times a day (QID) | ORAL | Status: DC | PRN

## 2015-04-05 NOTE — ED Provider Notes (Signed)
CSN: [id number]     Arrival date & time 04/05/15  1305 History   First MD Initiated Contact with Patient 04/05/15 1327     Chief Complaint  Patient presents with  . Sinus Problem  . URI   (Consider location/radiation/quality/duration/timing/severity/associated sxs/prior Treatment) HPI  He is a 39 year old man here for upper respiratory symptoms. He states this started about 6 weeks ago with intermittent earache. He was seen by his PCP who stated he did not have an ear infection. He is continued to have intermittent ear pain, currently he has discomfort in the left ear.  For the last 2 weeks he has had some sinus pressure, postnasal drainage, rhinorrhea, and congestion. In the last 3 days he has developed yellow phlegm as well as cough and wheezing. He denies any shortness of breath. He does report a fever on Monday of 101. He has been taking multiple over-the-counter medications without improvement. He works as an Public relations account executive and did a breathing treatment today, which helped.  Past Medical History  Diagnosis Date  . Urethral stricture   . GERD (gastroesophageal reflux disease)   . Hypertension   . Diabetes   . Hypercholesterolemia    Past Surgical History  Procedure Laterality Date  . Wisdom tooth extraction    . Balloon dilation  04/06/2012    Procedure: BALLOON DILATION;  Surgeon: Reece Packer, MD;  Location: [facility];  Service: Urology;  Laterality: N/A;   Family History  Problem Relation Age of Onset  . Colon cancer Neg Hx    Social History  Substance Use Topics  . Smoking status: Former Smoker    Quit date: 07/27/2011  . Smokeless tobacco: Never Used  . Alcohol Use: 3.6 oz/week    6 Cans of beer per week     Comment: About 4-5 beer per day on average.    Review of Systems As in history of present illness Allergies  Review of patient's allergies indicates no known allergies.  Home Medications   Prior to Admission medications   Medication Sig Start  Date End Date Taking? Authorizing Provider  albuterol (PROVENTIL HFA;VENTOLIN HFA) 108 (90 Base) MCG/ACT inhaler Inhale 2 puffs into the lungs every 4 (four) hours as needed for wheezing or shortness of breath. 04/05/15   Melony Overly, MD  azithromycin (ZITHROMAX Z-PAK) 250 MG tablet Take 2 pills today, then 1 pill daily until gone. 04/05/15   Melony Overly, MD  HYDROcodone-homatropine (HYCODAN) 5-1.5 MG/5ML syrup Take 5 mLs by mouth every 6 (six) hours as needed for cough. 04/05/15   Melony Overly, MD  INVOKAMET 50-500 MG TABS TK 1 T PO D 09/28/14   Historical Provider, MD  lisinopril (PRINIVIL,ZESTRIL) 40 MG tablet TK 1 T PO D 09/21/14   Historical Provider, MD  Magnesium 500 MG CAPS Take by mouth.    Historical Provider, MD  omeprazole (PRILOSEC) 20 MG capsule Take 20 mg by mouth daily.    Historical Provider, MD  Potassium (POTASSIMIN PO) Take 550 mg by mouth daily.    Historical Provider, MD  pravastatin (PRAVACHOL) 20 MG tablet TK 1 T PO D 08/30/14   Historical Provider, MD  predniSONE (DELTASONE) 50 MG tablet Take 1 pill daily for 5 days. 04/05/15   Melony Overly, MD   Meds Ordered and Administered this Visit  Medications - No data to display  BP 134/94 mmHg  Pulse 96  Temp(Src) 98 F (36.7 C) (Oral)  SpO2 96% No data found.  Physical Exam  Constitutional: He is oriented to person, place, and time. He appears well-developed and well-nourished. No distress.  HENT:  Mouth/Throat: No oropharyngeal exudate.  Nasal discharge present. Clear postnasal drainage seen. Mild pharyngeal erythema. TMs normal bilaterally. No sinus tenderness.  Neck: Neck supple.  Cardiovascular: Normal rate, regular rhythm and normal heart sounds.   No murmur heard. Pulmonary/Chest: Effort normal and breath sounds normal. No respiratory distress. He has no wheezes. He has no rales.  Lymphadenopathy:    He has no cervical adenopathy.  Neurological: He is alert and oriented to person, place, and time.    ED  Course  Procedures (including critical care time)  Labs Review Labs Reviewed - No data to display  Imaging Review No results found.    MDM   1. Other acute sinusitis   2. Bronchitis    With time course and documented fever earlier this week, will treat for sinusitis with azithromycin and prednisone. Albuterol prescription for wheezing. Hycodan as needed for cough. Follow-up if not improving in 2-3 days. Work note provided.    Melony Overly, MD 04/05/15 [phone]

## 2015-04-05 NOTE — Discharge Instructions (Signed)
You have a sinus infection and likely some mild bronchitis. Take azithromycin and prednisone as prescribed. Use the albuterol inhaler every 4 hours as needed for wheezing. Use the Hycodan every 4 hours as needed for coughing. This medicine will make you drowsy. Do not drive while taking this medicine. You should start to feel better in the next 2-3 days. Follow-up as needed.

## 2015-04-05 NOTE — ED Notes (Signed)
Here with c/o sinus pressure, pain behind eyes/post nasal drip with yellow phlegm  Unrelieved by otc medicine and steroid injection 2 weeks ago at doctors office Now progressing with chest discomfort and wheezing

## 2015-06-06 ENCOUNTER — Other Ambulatory Visit (INDEPENDENT_AMBULATORY_CARE_PROVIDER_SITE_OTHER): Payer: Self-pay | Admitting: Otolaryngology

## 2015-06-06 DIAGNOSIS — R221 Localized swelling, mass and lump, neck: Secondary | ICD-10-CM

## 2015-06-14 ENCOUNTER — Ambulatory Visit (HOSPITAL_COMMUNITY)
Admission: RE | Admit: 2015-06-14 | Discharge: 2015-06-14 | Disposition: A | Discharge status: Home / Self Care | Payer: BLUE CROSS/BLUE SHIELD | Source: Ambulatory Visit | Attending: Otolaryngology | Admitting: Otolaryngology

## 2015-06-14 ENCOUNTER — Other Ambulatory Visit: Payer: Self-pay | Admitting: Family Medicine

## 2015-06-14 DIAGNOSIS — R221 Localized swelling, mass and lump, neck: Secondary | ICD-10-CM | POA: Insufficient documentation

## 2015-06-14 DIAGNOSIS — R590 Localized enlarged lymph nodes: Secondary | ICD-10-CM | POA: Diagnosis not present

## 2015-06-14 MED ORDER — IOHEXOL 300 MG/ML  SOLN
75.0000 mL | Freq: Once | INTRAMUSCULAR | Status: AC | PRN
  Administered 2015-06-14: 75 mL via INTRAVENOUS

## 2015-06-22 ENCOUNTER — Ambulatory Visit (INDEPENDENT_AMBULATORY_CARE_PROVIDER_SITE_OTHER): Payer: BLUE CROSS/BLUE SHIELD | Admitting: Otolaryngology

## 2015-06-22 DIAGNOSIS — H9203 Otalgia, bilateral: Secondary | ICD-10-CM | POA: Diagnosis not present

## 2015-07-13 ENCOUNTER — Ambulatory Visit (INDEPENDENT_AMBULATORY_CARE_PROVIDER_SITE_OTHER): Payer: BLUE CROSS/BLUE SHIELD | Admitting: Otolaryngology

## 2015-07-13 DIAGNOSIS — H9209 Otalgia, unspecified ear: Secondary | ICD-10-CM | POA: Diagnosis not present

## 2015-07-13 DIAGNOSIS — H608X3 Other otitis externa, bilateral: Secondary | ICD-10-CM

## 2015-07-17 ENCOUNTER — Telehealth: Payer: Self-pay

## 2015-07-17 NOTE — Telephone Encounter (Signed)
Pt is calling to have a EGD done. He is wanting to know if he will need another appointment or if he can just have it scheduled. Please advise

## 2015-07-18 NOTE — Telephone Encounter (Signed)
LMOM to call and make appointment for office visit

## 2015-07-18 NOTE — Telephone Encounter (Signed)
Pt has an appointment on 08/09/15

## 2015-07-18 NOTE — Telephone Encounter (Signed)
We haven't seen him in almost a year, he'll need a follow-up visit.

## 2015-08-08 ENCOUNTER — Ambulatory Visit: Payer: Self-pay | Admitting: Nurse Practitioner

## 2015-08-09 ENCOUNTER — Other Ambulatory Visit: Payer: Self-pay

## 2015-08-09 ENCOUNTER — Ambulatory Visit (INDEPENDENT_AMBULATORY_CARE_PROVIDER_SITE_OTHER): Payer: BLUE CROSS/BLUE SHIELD | Admitting: Nurse Practitioner

## 2015-08-09 ENCOUNTER — Encounter: Payer: Self-pay | Admitting: Nurse Practitioner

## 2015-08-09 VITALS — BP 132/84 | HR 83 | Temp 98.0°F | Ht 70.0 in | Wt 231.0 lb

## 2015-08-09 DIAGNOSIS — K219 Gastro-esophageal reflux disease without esophagitis: Secondary | ICD-10-CM

## 2015-08-09 DIAGNOSIS — R1310 Dysphagia, unspecified: Secondary | ICD-10-CM | POA: Diagnosis not present

## 2015-08-09 NOTE — Progress Notes (Signed)
Referring Provider: Ginger Organ Primary Care Physician:  Collene Mares, PA-C Primary GI:  Dr. Oneida Alar  Chief Complaint  Patient presents with  . Dysphagia    HPI:   Joel Jefferson is a 40 y.o. male who presents For evaluation for dysphagia. He was last seen in our office 10/13/2014. At that time he stated solid food dysphagia for 1+ year and will have episodes where food will not move forward and will not be able to regurgitate. Eventually passes with time but associated with discomfort as well. Also noted GERD symptoms although well controlled with over-the-counter Prilosec. Discussed possible utility of EGD. He wanted to check with the [facility] facility to see if they will pay for an endoscopy done here or if he would have to have it done at the [place] and stated he would call and let us know. He called on 07/17/2015 to arrange for the EGD procedure although he had not been seen in almost a year, was recommended for a office visit prior to scheduling.  Today he states he's had some worsening of his symptoms, pills are getting stuck now as well, has not had this issue before. Continue solid food dysphagia about once a day, eventually passes with time/fluids, still has discomfort with dysphagia episodes. No regurgitation. Denies hematochezia, melena. GERD well controlled with OTC Prilosec. Denies chest pain, dyspnea, dizziness, lightheadedness, syncope, near syncope. Denies any other upper or lower GI symptoms.  Past Medical History  Diagnosis Date  . Urethral stricture   . GERD (gastroesophageal reflux disease)   . Hypertension   . Diabetes ([place])   . Hypercholesterolemia     Past Surgical History  Procedure Laterality Date  . Wisdom tooth extraction    . Balloon dilation  04/06/2012    Procedure: BALLOON DILATION;  Surgeon: Reece Packer, MD;  Location: [facility];  Service: Urology;  Laterality: N/A;    Current Outpatient Prescriptions  Medication Sig  Dispense Refill  . albuterol (PROVENTIL HFA;VENTOLIN HFA) 108 (90 Base) MCG/ACT inhaler Inhale 2 puffs into the lungs every 4 (four) hours as needed for wheezing or shortness of breath. 1 Inhaler 0  . INVOKAMET 50-500 MG TABS TK 1 T PO D  0  . lisinopril (PRINIVIL,ZESTRIL) 40 MG tablet TK 1 T PO D  5  . Magnesium 500 MG CAPS Take by mouth.    Marland Kitchen omeprazole (PRILOSEC) 20 MG capsule Take 20 mg by mouth daily.    . Potassium (POTASSIMIN PO) Take 550 mg by mouth daily.    . pravastatin (PRAVACHOL) 20 MG tablet TK 1 T PO D  1   No current facility-administered medications for this visit.    Allergies as of 08/09/2015  . (No Known Allergies)    Family History  Problem Relation Age of Onset  . Colon cancer Neg Hx     Social History   Social History  . Marital Status: Legally Separated    Spouse Name: N/A  . Number of Children: N/A  . Years of Education: N/A   Social History Main Topics  . Smoking status: Former Smoker    Quit date: 07/27/2011  . Smokeless tobacco: Never Used  . Alcohol Use: 3.6 oz/week    6 Cans of beer per week     Comment:  No ETOH since 04/11/15 (set a goal to quit ETOH). Previously about 4-5 beer per day on average;  . Drug Use: No  . Sexual Activity: Not Asked  Other Topics Concern  . None   Social History Narrative    Review of Systems: 10-point ROS negative except as per HPI.   Physical Exam: BP 132/84 mmHg  Pulse 83  Temp(Src) 98 F (36.7 C) (Oral)  Ht 5\' 10"  (1.778 m)  Wt 231 lb (104.781 kg)  BMI 33.15 kg/m2 General:   Alert and oriented. Pleasant and cooperative. Well-nourished and well-developed.  Ears:  Normal auditory acuity. Cardiovascular:  S1, S2 present without murmurs appreciated. Extremities without clubbing or edema. Respiratory:  Clear to auscultation bilaterally. No wheezes, rales, or rhonchi. No distress.  Gastrointestinal:  +BS, soft, non-tender and non-distended. No HSM noted. No guarding or rebound. No masses appreciated.    Rectal:  Deferred  Musculoskalatal:  Symmetrical without gross deformities. Neurologic:  Alert and oriented x4;  grossly normal neurologically. Psych:  Alert and cooperative. Normal mood and affect. Heme/Lymph/Immune: No excessive bruising noted.    08/09/2015 11:13 AM   Disclaimer: This note was dictated with voice recognition software. Similar sounding words can inadvertently be transcribed and may not be corrected upon review.

## 2015-08-09 NOTE — Progress Notes (Signed)
cc'ed to pcp °

## 2015-08-09 NOTE — Assessment & Plan Note (Signed)
Continued solid food and pill dysphagia, daily. No regurgitation. No bleeding. No other GI symptoms. At this point recommend continue PPI, we will schedule him for an endoscopy with possible dilation.  Proceed with EGD with propofol/MAC with Dr. Oneida Alar in near future: the risks, benefits, and alternatives have been discussed with the patient in detail. The patient states understanding and desires to proceed.  Patient is not on any anticoagulants, anxiolytics, chronic pain medications, or antidepressants. Previously drank 4-5 beers a day for several years and quit drinking in January. Given history of alcohol abuse we'll plan for the procedure on propofol/MAC to promote adequate sedation.

## 2015-08-09 NOTE — Assessment & Plan Note (Signed)
Symptoms well controlled on PPI. Continue to monitor.

## 2015-08-09 NOTE — Patient Instructions (Signed)
1. Keep taking Prilosec. 2. We will schedule your procedure for you. 3. Return for follow-up based on postprocedure recommendations.

## 2015-08-18 NOTE — Patient Instructions (Signed)
    Joel Jefferson.  08/18/2015     @PREFPERIOPPHARMACY @   Your procedure is scheduled on 08/29/2015.  Report to [facility] at 10:45 A.M.  Call this number if you have problems the morning of surgery:  [phone]   Remember:  Do not eat food or drink liquids after midnight.  Take these medicines the morning of surgery with A SIP OF WATER:  LISINOPRIL AND PRILOSEC.  PLEASE USE YOUR PROVENTIL INHALER PRIOR TO COMING TO THE HOSPITAL.  BRING YOUR INHALER WITH YOU  Do not wear jewelry, make-up or nail polish.  Do not wear lotions, powders, or perfumes.  You may wear deodorant.  Do not shave 48 hours prior to surgery.  Men may shave face and neck.  Do not bring valuables to the hospital.  [facility] is not responsible for any belongings or valuables.  Contacts, dentures or bridgework may not be worn into surgery.  Leave your suitcase in the car.  After surgery it may be brought to your room.  For patients admitted to the hospital, discharge time will be determined by your treatment team.  Patients discharged the day of surgery will not be allowed to drive home.   Name and phone number of your driver:   FAMILY Special instructions:  n/a Please read over the following fact sheets that you were given. Care and Recovery After Surgery    Esophagogastroduodenoscopy, Care After Refer to this sheet in the next few weeks. These instructions provide you with information about caring for yourself after your procedure. Your health care provider may also give you more specific instructions. Your treatment has been planned according to current medical practices, but problems sometimes occur. Call your health care provider if you have any problems or questions after your procedure. WHAT TO EXPECT AFTER THE PROCEDURE After your procedure, it is typical to feel:  Soreness in your throat.  Pain with swallowing.  Sick to your stomach (nauseous).  Bloated.  Dizzy.  Fatigued. HOME CARE  INSTRUCTIONS  Do not eat or drink anything until the numbing medicine (local anesthetic) has worn off and your gag reflex has returned. You will know that the local anesthetic has worn off when you can swallow comfortably.  Do not drive or operate machinery until directed by your health care provider.  Take medicines only as directed by your health care provider. SEEK MEDICAL CARE IF:   You cannot stop coughing.  You are not urinating at all or less than usual. SEEK IMMEDIATE MEDICAL CARE IF:  You have difficulty swallowing.  You cannot eat or drink.  You have worsening throat or chest pain.  You have dizziness or lightheadedness or you faint.  You have nausea or vomiting.  You have chills.  You have a fever.  You have severe abdominal pain.  You have black, tarry, or bloody stools.   This information is not intended to replace advice given to you by your health care provider. Make sure you discuss any questions you have with your health care provider.   Document Released: 03/11/2012 Document Revised: 04/15/2014 Document Reviewed: 03/11/2012 Elsevier Interactive Patient Education [employer].

## 2015-08-21 ENCOUNTER — Encounter (HOSPITAL_COMMUNITY): Payer: Self-pay

## 2015-08-21 ENCOUNTER — Encounter (HOSPITAL_COMMUNITY)
Admission: RE | Admit: 2015-08-21 | Discharge: 2015-08-21 | Disposition: A | Discharge status: Home / Self Care | Payer: BLUE CROSS/BLUE SHIELD | Source: Ambulatory Visit | Attending: Gastroenterology | Admitting: Gastroenterology

## 2015-08-21 ENCOUNTER — Other Ambulatory Visit: Payer: Self-pay

## 2015-08-21 DIAGNOSIS — E7800 Pure hypercholesterolemia, unspecified: Secondary | ICD-10-CM | POA: Insufficient documentation

## 2015-08-21 DIAGNOSIS — E119 Type 2 diabetes mellitus without complications: Secondary | ICD-10-CM | POA: Insufficient documentation

## 2015-08-21 DIAGNOSIS — Z01812 Encounter for preprocedural laboratory examination: Secondary | ICD-10-CM | POA: Insufficient documentation

## 2015-08-21 DIAGNOSIS — Z01810 Encounter for preprocedural cardiovascular examination: Secondary | ICD-10-CM | POA: Insufficient documentation

## 2015-08-21 DIAGNOSIS — I10 Essential (primary) hypertension: Secondary | ICD-10-CM | POA: Diagnosis not present

## 2015-08-21 DIAGNOSIS — Z87891 Personal history of nicotine dependence: Secondary | ICD-10-CM | POA: Diagnosis not present

## 2015-08-21 DIAGNOSIS — R1310 Dysphagia, unspecified: Secondary | ICD-10-CM | POA: Insufficient documentation

## 2015-08-21 DIAGNOSIS — K219 Gastro-esophageal reflux disease without esophagitis: Secondary | ICD-10-CM | POA: Diagnosis not present

## 2015-08-21 LAB — CBC WITH DIFFERENTIAL/PLATELET
BASOS PCT: 0 %
Basophils Absolute: 0 10*3/uL (ref 0.0–0.1)
EOS ABS: 0.2 10*3/uL (ref 0.0–0.7)
EOS PCT: 3 %
HCT: 48.5 % (ref 39.0–52.0)
Hemoglobin: 16.7 g/dL (ref 13.0–17.0)
LYMPHS ABS: 2.3 10*3/uL (ref 0.7–4.0)
Lymphocytes Relative: 26 %
MCH: 28.9 pg (ref 26.0–34.0)
MCHC: 34.4 g/dL (ref 30.0–36.0)
MCV: 83.9 fL (ref 78.0–100.0)
Monocytes Absolute: 0.5 10*3/uL (ref 0.1–1.0)
Monocytes Relative: 5 %
Neutro Abs: 6 10*3/uL (ref 1.7–7.7)
Neutrophils Relative %: 66 %
PLATELETS: 208 10*3/uL (ref 150–400)
RBC: 5.78 MIL/uL (ref 4.22–5.81)
RDW: 13.2 % (ref 11.5–15.5)
WBC: 9 10*3/uL (ref 4.0–10.5)

## 2015-08-21 LAB — BASIC METABOLIC PANEL
Anion gap: 7 (ref 5–15)
BUN: 22 mg/dL — AB (ref 6–20)
CO2: 25 mmol/L (ref 22–32)
CREATININE: 0.84 mg/dL (ref 0.61–1.24)
Calcium: 9.4 mg/dL (ref 8.9–10.3)
Chloride: 104 mmol/L (ref 101–111)
GFR calc Af Amer: >60 mL/min (ref >60)
Glucose, Bld: 124 mg/dL — ABNORMAL HIGH (ref 65–99)
POTASSIUM: 4.6 mmol/L (ref 3.5–5.1)
SODIUM: 136 mmol/L (ref 135–145)

## 2015-08-21 NOTE — Pre-Procedure Instructions (Signed)
Patient given information to sign up for my chart at home. 

## 2015-08-22 ENCOUNTER — Other Ambulatory Visit (HOSPITAL_COMMUNITY): Payer: Self-pay

## 2015-08-24 ENCOUNTER — Other Ambulatory Visit (HOSPITAL_COMMUNITY): Payer: Self-pay

## 2015-08-29 ENCOUNTER — Ambulatory Visit (HOSPITAL_COMMUNITY): Payer: BLUE CROSS/BLUE SHIELD | Admitting: Anesthesiology

## 2015-08-29 ENCOUNTER — Ambulatory Visit (HOSPITAL_COMMUNITY)
Admission: RE | Admit: 2015-08-29 | Discharge: 2015-08-29 | Disposition: A | Discharge status: Home / Self Care | Payer: BLUE CROSS/BLUE SHIELD | Source: Ambulatory Visit | Attending: Gastroenterology | Admitting: Gastroenterology

## 2015-08-29 ENCOUNTER — Encounter (HOSPITAL_COMMUNITY)
Admission: RE | Disposition: A | Discharge status: Home / Self Care | Payer: Self-pay | Source: Ambulatory Visit | Attending: Gastroenterology

## 2015-08-29 ENCOUNTER — Encounter (HOSPITAL_COMMUNITY): Payer: Self-pay | Admitting: *Deleted

## 2015-08-29 DIAGNOSIS — E7800 Pure hypercholesterolemia, unspecified: Secondary | ICD-10-CM | POA: Insufficient documentation

## 2015-08-29 DIAGNOSIS — K209 Esophagitis, unspecified: Secondary | ICD-10-CM

## 2015-08-29 DIAGNOSIS — K2950 Unspecified chronic gastritis without bleeding: Secondary | ICD-10-CM | POA: Insufficient documentation

## 2015-08-29 DIAGNOSIS — K2970 Gastritis, unspecified, without bleeding: Secondary | ICD-10-CM | POA: Diagnosis not present

## 2015-08-29 DIAGNOSIS — E119 Type 2 diabetes mellitus without complications: Secondary | ICD-10-CM | POA: Insufficient documentation

## 2015-08-29 DIAGNOSIS — R1310 Dysphagia, unspecified: Secondary | ICD-10-CM | POA: Insufficient documentation

## 2015-08-29 DIAGNOSIS — Z87891 Personal history of nicotine dependence: Secondary | ICD-10-CM | POA: Insufficient documentation

## 2015-08-29 DIAGNOSIS — K219 Gastro-esophageal reflux disease without esophagitis: Secondary | ICD-10-CM | POA: Diagnosis not present

## 2015-08-29 DIAGNOSIS — I10 Essential (primary) hypertension: Secondary | ICD-10-CM | POA: Insufficient documentation

## 2015-08-29 HISTORY — PX: SAVORY DILATION: SHX5439

## 2015-08-29 HISTORY — PX: ESOPHAGOGASTRODUODENOSCOPY (EGD) WITH PROPOFOL: SHX5813

## 2015-08-29 LAB — GLUCOSE, CAPILLARY
GLUCOSE-CAPILLARY: 120 mg/dL — AB (ref 65–99)
GLUCOSE-CAPILLARY: 103 mg/dL — AB (ref 65–99)

## 2015-08-29 SURGERY — ESOPHAGOGASTRODUODENOSCOPY (EGD) WITH PROPOFOL
Anesthesia: Monitor Anesthesia Care

## 2015-08-29 MED ORDER — PROPOFOL 500 MG/50ML IV EMUL
INTRAVENOUS | Status: DC | PRN
  Administered 2015-08-29: 150 ug/kg/min via INTRAVENOUS
  Administered 2015-08-29: 12:00:00 via INTRAVENOUS

## 2015-08-29 MED ORDER — FENTANYL CITRATE (PF) 100 MCG/2ML IJ SOLN
25.0000 ug | INTRAMUSCULAR | Status: AC
  Administered 2015-08-29 (×2): 25 ug via INTRAVENOUS

## 2015-08-29 MED ORDER — FENTANYL CITRATE (PF) 100 MCG/2ML IJ SOLN: 25.0000 ug | INTRAMUSCULAR | Status: DC | PRN

## 2015-08-29 MED ORDER — FENTANYL CITRATE (PF) 100 MCG/2ML IJ SOLN
INTRAMUSCULAR | Status: AC
  Filled 2015-08-29: qty 2

## 2015-08-29 MED ORDER — PROPOFOL 10 MG/ML IV BOLUS
INTRAVENOUS | Status: AC
  Filled 2015-08-29: qty 20

## 2015-08-29 MED ORDER — MIDAZOLAM HCL 2 MG/2ML IJ SOLN
INTRAMUSCULAR | Status: AC
  Filled 2015-08-29: qty 2

## 2015-08-29 MED ORDER — MIDAZOLAM HCL 2 MG/2ML IJ SOLN
1.0000 mg | INTRAMUSCULAR | Status: DC | PRN
  Administered 2015-08-29: 2 mg via INTRAVENOUS

## 2015-08-29 MED ORDER — MIDAZOLAM HCL 5 MG/5ML IJ SOLN
INTRAMUSCULAR | Status: DC | PRN
  Administered 2015-08-29: 2 mg via INTRAVENOUS

## 2015-08-29 MED ORDER — LIDOCAINE VISCOUS 2 % MT SOLN
OROMUCOSAL | Status: AC
  Filled 2015-08-29: qty 15

## 2015-08-29 MED ORDER — LIDOCAINE HCL (PF) 1 % IJ SOLN
INTRAMUSCULAR | Status: AC
  Filled 2015-08-29: qty 5

## 2015-08-29 MED ORDER — ONDANSETRON HCL 4 MG/2ML IJ SOLN: 4.0000 mg | Freq: Once | INTRAMUSCULAR | Status: DC | PRN

## 2015-08-29 MED ORDER — LIDOCAINE VISCOUS 2 % MT SOLN
15.0000 mL | Freq: Once | OROMUCOSAL | Status: AC
  Administered 2015-08-29: 6 mL via OROMUCOSAL

## 2015-08-29 MED ORDER — LACTATED RINGERS IV SOLN
INTRAVENOUS | Status: DC
  Administered 2015-08-29: 11:00:00 via INTRAVENOUS

## 2015-08-29 NOTE — Anesthesia Postprocedure Evaluation (Signed)
Anesthesia Post Note  Patient: Kedan Vitatoe.  Procedure(s) Performed: Procedure(s) (LRB): ESOPHAGOGASTRODUODENOSCOPY (EGD) WITH PROPOFOL (N/A) SAVORY DILATION (N/A)  Patient location during evaluation: PACU Anesthesia Type: MAC Level of consciousness: awake and patient cooperative Pain management: pain level controlled Vital Signs Assessment: post-procedure vital signs reviewed and stable Respiratory status: spontaneous breathing, nonlabored ventilation and respiratory function stable Cardiovascular status: blood pressure returned to baseline Postop Assessment: no signs of nausea or vomiting Anesthetic complications: no    Last Vitals:  Filed Vitals:   08/29/15 1115 08/29/15 1120  BP: 116/77 129/86  Temp:    Resp: 17 0    Last Pain: There were no vitals filed for this visit.               Delonna Ney J

## 2015-08-29 NOTE — H&P (Signed)
  Primary Care Physician:  Collene Mares, PA-C Primary Gastroenterologist:  Dr. Oneida Alar  Pre-Procedure History & Physical: HPI:  Joel Jefferson. is a 40 y.o. male here for [place].  Past Medical History  Diagnosis Date  . Urethral stricture   . GERD (gastroesophageal reflux disease)   . Hypertension   . Diabetes ([place])   . Hypercholesterolemia     Past Surgical History  Procedure Laterality Date  . Wisdom tooth extraction    . Balloon dilation  04/06/2012    Procedure: BALLOON DILATION;  Surgeon: Reece Packer, MD;  Location: [facility];  Service: Urology;  Laterality: N/A;    Prior to Admission medications   Medication Sig Start Date End Date Taking? Authorizing Provider  albuterol (PROVENTIL HFA;VENTOLIN HFA) 108 (90 Base) MCG/ACT inhaler Inhale 2 puffs into the lungs every 4 (four) hours as needed for wheezing or shortness of breath. 04/05/15  Yes Melony Overly, MD  cyclobenzaprine (FLEXERIL) 10 MG tablet Take 1 tablet by mouth at bedtime as needed for muscle spasms.  07/20/15  Yes Historical Provider, MD  ibuprofen (ADVIL,MOTRIN) 800 MG tablet Take 1 tablet by mouth 3 (three) times daily as needed for moderate pain.  07/20/15  Yes Historical Provider, MD  INVOKAMET 50-500 MG TABS TAKE 1 TABLET BY MOUTH ONCE DAILY. 09/28/14  Yes Historical Provider, MD  lisinopril (PRINIVIL,ZESTRIL) 40 MG tablet TAKE 1 TABLET BY MOUTH ONCE DAILY. 09/21/14  Yes Historical Provider, MD  Magnesium 500 MG CAPS Take by mouth.   Yes Historical Provider, MD  omeprazole (PRILOSEC) 20 MG capsule Take 20 mg by mouth daily.   Yes Historical Provider, MD  Potassium (POTASSIMIN PO) Take 550 mg by mouth daily.   Yes Historical Provider, MD  pravastatin (PRAVACHOL) 20 MG tablet TAKE 1 TABLET BY MOUTH ONCE DAILY. 08/30/14  Yes Historical Provider, MD    Allergies as of 08/09/2015  . (No Known Allergies)    Family History  Problem Relation Age of Onset  . Colon cancer Neg Hx      Social History   Social History  . Marital Status: Legally Separated    Spouse Name: N/A  . Number of Children: N/A  . Years of Education: N/A   Occupational History  . Not on file.   Social History Main Topics  . Smoking status: Former Smoker -- 0.25 packs/day for 9 years    Types: Cigarettes    Quit date: 07/27/2011  . Smokeless tobacco: Never Used  . Alcohol Use: No     Comment:  No ETOH since 04/11/15 (set a goal to quit ETOH). Previously about 4-5 beer per day on average;  . Drug Use: No  . Sexual Activity: Yes    Birth Control/ Protection: None   Other Topics Concern  . Not on file   Social History Narrative    Review of Systems: See HPI, otherwise negative ROS   Physical Exam: Temp(Src) 98.2 F (36.8 C) General:   Alert,  pleasant and cooperative in NAD Head:  Normocephalic and atraumatic. Neck:  Supple; Lungs:  Clear throughout to auscultation.    Heart:  Regular rate and rhythm. Abdomen:  Soft, nontender and nondistended. Normal bowel sounds, without guarding, and without rebound.   Neurologic:  Alert and  oriented x4;  grossly normal neurologically.  Impression/Plan:     DYSPHAGIA  PLAN:  EGD/DIL TODAY

## 2015-08-29 NOTE — Op Note (Signed)
[facility] Patient Name: Joel Jefferson Procedure Date: 08/29/2015 11:44 AM MRN: [id number] Date of Birth: Feb 26, 1976 Attending MD: Barney Drain , MD CSN: [id number] Age: 40 Admit Type: Outpatient Procedure:                Upper GI endoscopy with cold biopsy/SAVARY DILATION Indications:              Dysphagia Providers:                Barney Drain, MD, Janeece Riggers, RN, Georgeann Oppenheim,                            Technician Referring MD:             Collene Mares, PA Medicines:                Propofol per Anesthesia Complications:            No immediate complications. Estimated Blood Loss:     Estimated blood loss was minimal. Procedure:                Pre-Anesthesia Assessment:                           - Prior to the procedure, a History and Physical                            was performed, and patient medications and                            allergies were reviewed. The patient's tolerance of                            previous anesthesia was also reviewed. The risks                            and benefits of the procedure and the sedation                            options and risks were discussed with the patient.                            All questions were answered, and informed consent                            was obtained. Prior Anticoagulants: The patient has                            taken no previous anticoagulant or antiplatelet                            agents. ASA Grade Assessment: II - A patient with                            mild systemic disease. After reviewing the risks  and benefits, the patient was deemed in                            satisfactory condition to undergo the procedure.                           After obtaining informed consent, the endoscope was                            passed under direct vision. Throughout the                            procedure, the patient's blood pressure, pulse, and                             oxygen saturations were monitored continuously. The                            [id number] scope was introduced through the                            mouth, and advanced to the second part of duodenum.                            The upper GI endoscopy was accomplished without                            difficulty. The patient tolerated the procedure                            well. Scope In: 11:52:49 AM Scope Out: [id number] PM Total Procedure Duration: 0 hours 14 minutes 29 seconds  Findings:      Mucosal changes including white plaques were found in the proximal       esophagus and in the distal esophagus. Esophageal findings were graded       using the Eosinophilic Esophagitis Endoscopic Reference Score       (EoE-EREFS) as: Exudates Grade 1 Mild (scattered white lesions involving       less than 10 percent of the esophageal surface area). Biopsies were       obtained from the proximal(~20 CM FROM THE INCISORS) and distal       esophagus(~35 CM FROM THE INCISORS) with cold forceps for histology of       suspected eosinophilic esophagitis. ESOPHAGUS DILATED FROM 14-17 MM WITH       MINIMAL RESISTANCE-TRACE HEME.      Scattered mild inflammation characterized by congestion (edema) and       erythema was found in the stomach. Biopsies were taken with a cold       forceps for Helicobacter pylori testing.      The examined duodenum was normal.      A web was found in the proximal esophagus. A guidewire was placed and       the scope was withdrawn. Dilation was performed with a Savary dilator       with mild resistance at 14 mm, 15 mm, 16 mm and 17 mm. Impression:               -  Esophageal mucosal changes suspicious for                            eosinophilic esophagitis. .                           - Gastritis, MODERATE                           - POSSIBLE PROXIMAL ESOPHAGEAL WEB Moderate Sedation:      Per Anesthesia Care Recommendation:           - Resume previous  diet.                           - Continue present medications.                           - Await pathology results.                           - Return to my office in 3 months.                           DRINK WATER TO KEEP URINE LIGHT YELLOW.                           CONTINUE YOUR WEIGHT LOSS EFFORTS. LOSE 10 POUNDS.                           AVOID TRIGGERS FOR REFLUX.                           CONTINUE OMEPRAZOLE. TAKE 30 MINUTES PRIOR TO YOUR                            FIRST MEAL.                           FOLLOW UP IN 3 MOS.                           - Patient has a contact number available for                            emergencies. The signs and symptoms of potential                            delayed complications were discussed with the                            patient. Return to normal activities tomorrow.                            Written discharge instructions were provided to the  patient. Procedure Code(s):        --- Professional ---                           [phone], Esophagogastroduodenoscopy, flexible,                            transoral; with insertion of guide wire followed by                            passage of dilator(s) through esophagus over guide                            wire                           [id number], Esophagogastroduodenoscopy, flexible,                            transoral; with biopsy, single or multiple Diagnosis Code(s):        --- Professional ---                           K20.9, Esophagitis, unspecified                           K29.70, Gastritis, unspecified, without bleeding                           R13.10, Dysphagia, unspecified CPT copyright 2016 American Medical Association. All rights reserved. The codes documented in this report are preliminary and upon coder review may  be revised to meet current compliance requirements. Barney Drain, MD Barney Drain, MD 08/29/2015 9:41:18 PM This report has been signed  electronically. Number of Addenda: 0

## 2015-08-29 NOTE — Anesthesia Preprocedure Evaluation (Signed)
Anesthesia Evaluation  Patient identified by MRN, date of birth, ID band Patient awake    Reviewed: Allergy & Precautions, H&P , NPO status , Patient's Chart, lab work & pertinent test results  Airway Mallampati: II  TM Distance: >3 FB Neck ROM: full    Dental no notable dental hx.    Pulmonary neg pulmonary ROS, former smoker,    Pulmonary exam normal breath sounds clear to auscultation       Cardiovascular Exercise Tolerance: Good hypertension, Pt. on medications negative cardio ROS   Rhythm:regular Rate:Normal     Neuro/Psych negative neurological ROS  negative psych ROS   GI/Hepatic negative GI ROS, Neg liver ROS, GERD  ,  Endo/Other  negative endocrine ROSdiabetes, Type 2, Oral Hypoglycemic Agents  Renal/GU negative Renal ROS  negative genitourinary   Musculoskeletal   Abdominal   Peds  Hematology negative hematology ROS (+)   Anesthesia Other Findings   Reproductive/Obstetrics negative OB ROS                             Anesthesia Physical Anesthesia Plan  ASA: II  Anesthesia Plan: MAC   Post-op Pain Management:    Induction: Intravenous  Airway Management Planned: Simple Face Mask  Additional Equipment:   Intra-op Plan:   Post-operative Plan:   Informed Consent: I have reviewed the patients History and Physical, chart, labs and discussed the procedure including the risks, benefits and alternatives for the proposed anesthesia with the patient or authorized representative who has indicated his/her understanding and acceptance.     Plan Discussed with:   Anesthesia Plan Comments:         Anesthesia Quick Evaluation

## 2015-08-29 NOTE — Discharge Instructions (Signed)
I dilated your esophagus. I DID NOT SEE A DEFINITE NARROWING IN YOUR esophagus. You have gastritis DUE TO IBUPROFEN. I biopsied your ESOPHAGUS AND stomach.  DRINK WATER TO KEEP YOUR URINE LIGHT YELLOW.  CONTINUE YOUR WEIGHT LOSS EFFORTS. LOSE 10 POUNDS.  AVOID TRIGGERS FOR REFLUX. SEE INFO BELOW.  CONTINUE OMEPRAZOLE.  TAKE 30 MINUTES PRIOR TO YOUR FIRST MEAL.  YOUR BIOPSY RESULTS WILL BE AVAILABLE IN MY CHART  MAY 26 AND MY OFFICE WILL CONTACT YOU IN 10-14 DAYS WITH YOUR RESULTS.   FOLLOW UP IN 3 MOS. UPPER ENDOSCOPY AFTER CARE Read the instructions outlined below and refer to this sheet in the next week. These discharge instructions provide you with general information on caring for yourself after you leave the hospital. While your treatment has been planned according to the most current medical practices available, unavoidable complications occasionally occur. If you have any problems or questions after discharge, call DR. Taccara Bushnell, [phone].  ACTIVITY  You may resume your regular activity, but move at a slower pace for the next 24 hours.   Take frequent rest periods for the next 24 hours.   Walking will help get rid of the air and reduce the bloated feeling in your belly (abdomen).   No driving for 24 hours (because of the medicine (anesthesia) used during the test).   You may shower.   Do not sign any important legal documents or operate any machinery for 24 hours (because of the anesthesia used during the test).    NUTRITION  Drink plenty of fluids.   You may resume your normal diet as instructed by your doctor.   Begin with a light meal and progress to your normal diet. Heavy or fried foods are harder to digest and may make you feel sick to your stomach (nauseated).   Avoid alcoholic beverages for 24 hours or as instructed.    MEDICATIONS  You may resume your normal medications.   WHAT YOU CAN EXPECT TODAY  Some feelings of bloating in the abdomen.    Passage of more gas than usual.    IF YOU HAD A BIOPSY TAKEN DURING THE UPPER ENDOSCOPY:  Eat a soft diet IF YOU HAVE NAUSEA, BLOATING, ABDOMINAL PAIN, OR VOMITING.    FINDING OUT THE RESULTS OF YOUR TEST Not all test results are available during your visit. DR. Oneida Alar WILL CALL YOU WITHIN 14 DAYS OF YOUR PROCEDUE WITH YOUR RESULTS. Do not assume everything is normal if you have not heard from DR. Carmelle Bamberg, CALL HER OFFICE AT [phone].  SEEK IMMEDIATE MEDICAL ATTENTION AND CALL THE OFFICE: [phone] IF:  You have more than a spotting of blood in your stool.   Your belly is swollen (abdominal distention).   You are nauseated or vomiting.   You have a temperature over 101F.   You have abdominal pain or discomfort that is severe or gets worse throughout the day.  Gastritis  Gastritis is an inflammation (the body's way of reacting to injury and/or infection) of the stomach. It is often caused by viral or bacterial (germ) infections. It can also be caused BY ASPIRIN, BC/GOODY POWDER'S, (IBUPROFEN) MOTRIN, OR ALEVE (NAPROXEN), chemicals (including alcohol), SPICY FOODS, and medications. This illness may be associated with generalized malaise (feeling tired, not well), UPPER ABDOMINAL STOMACH cramps, and fever. One common bacterial cause of gastritis is an organism known as H. Pylori. This can be treated with antibiotics.   REFLUX  SYMPTOMS Common symptoms of GERD are heartburn (burning in your chest).  This is worse when lying down or bending over. It may also cause belching and indigestion. Some of the things which make GERD worse are:  Increased weight pushes on stomach making acid rise more easily.   Smoking markedly increases acid production.   Alcohol decreases lower esophageal sphincter pressure (valve between stomach and esophagus), allowing acid from stomach into esophagus.   HOME CARE INSTRUCTIONS  Try to achieve and maintain an ideal body weight.   Avoid drinking  alcoholic beverages.   DO NOT smokE.   Do not wear tight clothing around your chest or stomach.   Eat smaller meals and eat more frequently. This keeps your stomach from getting too full. Eat slowly.   Do not lie down for 2 or 3 hours after eating. Do not eat or drink anything 1 to 2 hours before going to bed.   Avoid caffeine beverages (colas, coffee, cocoa, tea), fatty foods, citrus fruits and all other foods and drinks that contain acid and that seem to increase the problems.   Avoid bending over, especially after eating OR STRAINING. Anything that increases the pressure in your belly increases the amount of acid that may be pushed up into your esophagus.    DYSPHAGIA DYSPHAGIA can be caused by stomach acid backing up into the tube that carries food from the mouth down to the stomach (lower esophagus).  TREATMENT There are a number of  medicines used to treat DYSPHAGIA including: Antacids.  Proton-pump inhibitors: OMEPRAZOLE  HOME CARE INSTRUCTIONS Eat 2-3 hours before going to bed.  Try to reach and maintain a healthy weight.  Do not eat just a few very large meals. Instead, eat 4 TO 6 smaller meals throughout the day.  Try to identify foods and beverages that make your symptoms worse, and avoid these.  Avoid tight clothing.  Do not exercise right after eating.   Lifestyle and home remedies TO MANAGE REFLUX/CHEST PAIN  You may eliminate or reduce the frequency of heartburn by making the following lifestyle changes:   Control your weight. Being overweight is a major risk factor for heartburn and GERD. Excess pounds put pressure on your abdomen, pushing up your stomach and causing acid to back up into your esophagus.    Eat smaller meals. 4 TO 6 MEALS A DAY. This reduces pressure on the lower esophageal sphincter, helping to prevent the valve from opening and acid from washing back into your esophagus.    Loosen your belt. Clothes that fit tightly around your waist put  pressure on your abdomen and the lower esophageal sphincter.    Eliminate heartburn triggers. Everyone has specific triggers. Common triggers such as fatty or fried foods, spicy food, tomato sauce, carbonated beverages, alcohol, chocolate, mint, garlic, onion, caffeine and nicotine may make heartburn worse.    Avoid stooping or bending. Tying your shoes is OK. Bending over for longer periods to weed your garden isn't, especially soon after eating.    Don't lie down OR BEND OVER after a meal. Wait at least three to four hours after eating before going to bed, and don't lie down right after eating.    PUT THE HEAD OF YOUR BED ON 6 INCH BLOCKS.   Alternative medicine  Several home remedies exist for treating GERD, but they provide only temporary relief. They include drinking baking soda (sodium bicarbonate) added to water or drinking other fluids such as baking soda mixed with cream of tartar and water.   Although these liquids create temporary relief by  neutralizing, washing away or buffering acids, eventually they aggravate the situation by adding gas and fluid to your stomach, increasing pressure and causing more acid reflux. Further, adding more sodium to your diet may increase your blood pressure and add stress to your heart, and excessive bicarbonate ingestion can alter the acid-base balance in your body.

## 2015-08-29 NOTE — Progress Notes (Signed)
REVIEWED-NO ADDITIONAL RECOMMENDATIONS. 

## 2015-08-29 NOTE — Transfer of Care (Signed)
Immediate Anesthesia Transfer of Care Note  Patient: Charlemagne Houtz.  Procedure(s) Performed: Procedure(s) with comments: ESOPHAGOGASTRODUODENOSCOPY (EGD) WITH PROPOFOL (N/A) - 1215 SAVORY DILATION (N/A)  Patient Location: PACU  Anesthesia Type:MAC  Level of Consciousness: sedated  Airway & Oxygen Therapy: Patient Spontanous Breathing and Patient connected to face mask oxygen  Post-op Assessment: Report given to RN, Post -op Vital signs reviewed and stable and Patient moving all extremities  Post vital signs: Reviewed and stable  Last Vitals:  Filed Vitals:   08/29/15 1115 08/29/15 1120  BP: 116/77 129/86  Temp:    Resp: 17 0    Last Pain: There were no vitals filed for this visit.    Patients Stated Pain Goal: 8 ([id number])  Complications: No apparent anesthesia complications

## 2015-08-31 ENCOUNTER — Encounter (HOSPITAL_COMMUNITY): Payer: Self-pay | Admitting: Gastroenterology

## 2015-09-07 ENCOUNTER — Telehealth: Payer: Self-pay | Admitting: Gastroenterology

## 2015-09-07 NOTE — Telephone Encounter (Signed)
Pt is aware.  

## 2015-09-07 NOTE — Telephone Encounter (Signed)
LMOM to call.

## 2015-09-07 NOTE — Telephone Encounter (Signed)
Please call pt. HIS stomach Bx shows gastritis. THE ESOPHAGUS BIOPSIES ARE NORMAL.  DRINK WATER TO KEEP YOUR URINE LIGHT YELLOW.  CONTINUE YOUR WEIGHT LOSS EFFORTS. LOSE 10 POUNDS.  AVOID TRIGGERS FOR REFLUX.   CONTINUE OMEPRAZOLE.  TAKE 30 MINUTES PRIOR TO YOUR FIRST MEAL.  PLEASE CALL IN ONE MONTH IF YOUR STILL HAVING PROBLEMS SWALLOWING.  FOLLOW UP IN 3 MOS E30 DX: DYSPHAGIA, GERD.

## 2015-09-07 NOTE — Telephone Encounter (Signed)
PT SCHEDULED FOR FU OV °

## 2015-11-29 ENCOUNTER — Ambulatory Visit (INDEPENDENT_AMBULATORY_CARE_PROVIDER_SITE_OTHER): Payer: BLUE CROSS/BLUE SHIELD | Admitting: Gastroenterology

## 2015-11-29 ENCOUNTER — Encounter: Payer: Self-pay | Admitting: Gastroenterology

## 2015-11-29 VITALS — BP 133/78 | HR 101 | Temp 97.8°F | Ht 70.0 in | Wt 222.6 lb

## 2015-11-29 DIAGNOSIS — R1310 Dysphagia, unspecified: Secondary | ICD-10-CM

## 2015-11-29 NOTE — Progress Notes (Signed)
cc'ed to pcp °

## 2015-11-29 NOTE — Patient Instructions (Signed)
We have ordered a barium pill study to further examine your esophagus.  Continue taking Prilosec 30 minutes before the first meal of the day.  Further recommendations to follow

## 2015-11-29 NOTE — Assessment & Plan Note (Signed)
With recent EGD s/p dilation of possible proximal esophageal web. Still with oropharyngeal dysphagia and mild esophageal dysphagia. Query if some may be related to behavior-related issues such as taking multiple pills at one time, eating quickly. Unable to exclude motility disorder. Will proceed with BPE. Doubt will need repeat dilation. May need ENT referral if persistent oropharyngeal component despite dietary/behavior changes. Continue Prilosec once daily, which is managing symptomatic GERD quite well.

## 2015-11-29 NOTE — Progress Notes (Signed)
Referring Provider: Ginger Organ Primary Care Physician:  Collene Mares, PA-C  Primary GI: Dr. Oneida Alar   Chief Complaint  Patient presents with  . Follow-up    HPI:   Joel Jefferson. is a 40 y.o. male presenting today with a history of chronic GERD, recent EGD with possible proximal esophageal web s/p dilation, moderate gastritis, negative biopsies for eosinophilic esophagitis. Here for follow-up for dysphagia.   States he has lost 12 lbs in last 6 weeks. Saw Delman Cheadle at [facility] and placed on Topamax. Curbed his appetite. Cutting down on carbs. Still feels like food is still getting hung up. Sometimes still feels like food and pills are getting stuck in back of throat and mid esophagus. Feels like gagging more with taking pills. Haven't tried taking pills one at a time but takes them all at a time. Sometimes with eating will gag and feels like it is catching in back of throat. Feels like one of two pills will sit in back of throat and then passes on down. Feels like food gets hung up in mid esophagus. Taking Prilosec once daily. Reflux is controlled on Prilosec once daily. Will only have rare issues if eating chicken wings or pizza but overall no issues. Has completely quit drinking as of January 2017.   Past Medical History:  Diagnosis Date  . Diabetes ([place])   . GERD (gastroesophageal reflux disease)   . Hypercholesterolemia   . Hypertension   . Urethral stricture     Past Surgical History:  Procedure Laterality Date  . BALLOON DILATION  04/06/2012   Procedure: BALLOON DILATION;  Surgeon: Reece Packer, MD;  Location: [facility];  Service: Urology;  Laterality: N/A;  . ESOPHAGOGASTRODUODENOSCOPY (EGD) WITH PROPOFOL N/A 08/29/2015   Dr. Oneida Alar: possible proximal esophageal web s/p dilation, moderate gastritis, negative eosinophilic esophagitis   . SAVORY DILATION N/A 08/29/2015   Procedure: SAVORY DILATION;  Surgeon: Danie Binder,  MD;  Location: [facility];  Service: Endoscopy;  Laterality: N/A;  . WISDOM TOOTH EXTRACTION      Current Outpatient Prescriptions  Medication Sig Dispense Refill  . albuterol (PROVENTIL HFA;VENTOLIN HFA) 108 (90 Base) MCG/ACT inhaler Inhale 2 puffs into the lungs every 4 (four) hours as needed for wheezing or shortness of breath. 1 Inhaler 0  . cyclobenzaprine (FLEXERIL) 10 MG tablet Take 1 tablet by mouth at bedtime as needed for muscle spasms.   2  . ibuprofen (ADVIL,MOTRIN) 800 MG tablet Take 1 tablet by mouth 3 (three) times daily as needed for moderate pain.   3  . lisinopril (PRINIVIL,ZESTRIL) 40 MG tablet TAKE 1 TABLET BY MOUTH ONCE DAILY.  5  . Magnesium 500 MG CAPS Take by mouth.    . metFORMIN (GLUCOPHAGE) 500 MG tablet Take 500 mg by mouth 2 (two) times daily with a meal.    . omeprazole (PRILOSEC) 20 MG capsule Take 20 mg by mouth daily.    . Potassium (POTASSIMIN PO) Take 550 mg by mouth daily.    . pravastatin (PRAVACHOL) 20 MG tablet TAKE 1 TABLET BY MOUTH ONCE DAILY.  1  . topiramate (TOPAMAX) 100 MG tablet TK 1 T PO  BID  0   No current facility-administered medications for this visit.     Allergies as of 11/29/2015  . (No Known Allergies)    Family History  Problem Relation Age of Onset  . Colon cancer Neg Hx     Social  History   Social History  . Marital status: Divorced    Spouse name: N/A  . Number of children: N/A  . Years of education: N/A   Social History Main Topics  . Smoking status: Former Smoker    Packs/day: 0.25    Years: 9.00    Types: Cigarettes    Quit date: 07/27/2011  . Smokeless tobacco: Never Used  . Alcohol use No     Comment:  No ETOH since 04/11/15 (set a goal to quit ETOH). Previously about 4-5 beer per day on average;  . Drug use: No  . Sexual activity: Yes    Birth control/ protection: None   Other Topics Concern  . None   Social History Narrative  . None    Review of Systems: As mentioned in HPI   Physical  Exam: BP 133/78   Pulse (!) 101   Temp 97.8 F (36.6 C) (Oral)   Ht 5\' 10"  (1.778 m)   Wt 222 lb 9.6 oz (101 kg)   BMI 31.94 kg/m  General:   Alert and oriented. No distress noted. Pleasant and cooperative.  Head:  Normocephalic and atraumatic. Eyes:  Conjuctiva clear without scleral icterus. Abdomen:  +BS, soft, non-tender and non-distended. No rebound or guarding. No HSM or masses noted. Msk:  Symmetrical without gross deformities. Normal posture. Extremities:  Without edema. Neurologic:  Alert and  oriented x4;  grossly normal neurologically. Psych:  Alert and cooperative. Normal mood and affect.

## 2015-12-19 ENCOUNTER — Ambulatory Visit (HOSPITAL_COMMUNITY)
Admission: RE | Admit: 2015-12-19 | Discharge: 2015-12-19 | Disposition: A | Discharge status: Home / Self Care | Payer: BLUE CROSS/BLUE SHIELD | Source: Ambulatory Visit | Attending: Gastroenterology | Admitting: Gastroenterology

## 2015-12-19 DIAGNOSIS — R1310 Dysphagia, unspecified: Secondary | ICD-10-CM | POA: Diagnosis not present

## 2015-12-19 DIAGNOSIS — K219 Gastro-esophageal reflux disease without esophagitis: Secondary | ICD-10-CM | POA: Diagnosis not present

## 2016-01-01 NOTE — Progress Notes (Signed)
BPE reviewed. He has significant reflux noted on exam, especially when laying down. He has dysmotility, which is likely why he feels like things are getting "hung up". He needs to take pills one at a time, sit upright while eating, and do not eat late at night. I would increase PPI to twice a day for 30 days and see how he does with this. Let's get a progress report in 30 days.

## 2016-01-02 NOTE — Progress Notes (Signed)
LMOM to call.

## 2016-01-02 NOTE — Progress Notes (Signed)
PT is aware of results and plan.  

## 2016-03-12 DIAGNOSIS — E6609 Other obesity due to excess calories: Secondary | ICD-10-CM | POA: Diagnosis not present

## 2016-03-12 DIAGNOSIS — I10 Essential (primary) hypertension: Secondary | ICD-10-CM | POA: Diagnosis not present

## 2016-03-12 DIAGNOSIS — H6500 Acute serous otitis media, unspecified ear: Secondary | ICD-10-CM | POA: Diagnosis not present

## 2016-03-12 DIAGNOSIS — E782 Mixed hyperlipidemia: Secondary | ICD-10-CM | POA: Diagnosis not present

## 2016-03-12 DIAGNOSIS — Z6832 Body mass index (BMI) 32.0-32.9, adult: Secondary | ICD-10-CM | POA: Diagnosis not present

## 2016-03-12 DIAGNOSIS — Z1389 Encounter for screening for other disorder: Secondary | ICD-10-CM | POA: Diagnosis not present

## 2016-03-12 DIAGNOSIS — R7309 Other abnormal glucose: Secondary | ICD-10-CM | POA: Diagnosis not present

## 2016-04-10 DIAGNOSIS — N390 Urinary tract infection, site not specified: Secondary | ICD-10-CM | POA: Diagnosis not present

## 2016-04-10 DIAGNOSIS — E6609 Other obesity due to excess calories: Secondary | ICD-10-CM | POA: Diagnosis not present

## 2016-04-10 DIAGNOSIS — Z6832 Body mass index (BMI) 32.0-32.9, adult: Secondary | ICD-10-CM | POA: Diagnosis not present

## 2016-04-10 DIAGNOSIS — Z1389 Encounter for screening for other disorder: Secondary | ICD-10-CM | POA: Diagnosis not present

## 2016-04-26 DIAGNOSIS — R109 Unspecified abdominal pain: Secondary | ICD-10-CM | POA: Diagnosis not present

## 2016-04-26 DIAGNOSIS — N359 Urethral stricture, unspecified: Secondary | ICD-10-CM | POA: Diagnosis not present

## 2016-04-26 DIAGNOSIS — N201 Calculus of ureter: Secondary | ICD-10-CM | POA: Diagnosis not present

## 2016-05-31 DIAGNOSIS — N201 Calculus of ureter: Secondary | ICD-10-CM | POA: Diagnosis not present

## 2016-05-31 DIAGNOSIS — R300 Dysuria: Secondary | ICD-10-CM | POA: Diagnosis not present

## 2017-01-03 ENCOUNTER — Encounter (HOSPITAL_COMMUNITY): Payer: Self-pay | Admitting: Emergency Medicine

## 2017-01-03 ENCOUNTER — Emergency Department (HOSPITAL_COMMUNITY)
Admission: EM | Admit: 2017-01-03 | Discharge: 2017-01-03 | Disposition: A | Discharge status: Home / Self Care | Payer: 59 | Attending: Emergency Medicine | Admitting: Emergency Medicine

## 2017-01-03 ENCOUNTER — Emergency Department (HOSPITAL_COMMUNITY): Payer: 59

## 2017-01-03 DIAGNOSIS — Z7984 Long term (current) use of oral hypoglycemic drugs: Secondary | ICD-10-CM | POA: Diagnosis not present

## 2017-01-03 DIAGNOSIS — Z87891 Personal history of nicotine dependence: Secondary | ICD-10-CM | POA: Diagnosis not present

## 2017-01-03 DIAGNOSIS — N200 Calculus of kidney: Secondary | ICD-10-CM | POA: Diagnosis not present

## 2017-01-03 DIAGNOSIS — R109 Unspecified abdominal pain: Secondary | ICD-10-CM | POA: Diagnosis not present

## 2017-01-03 DIAGNOSIS — N23 Unspecified renal colic: Secondary | ICD-10-CM | POA: Insufficient documentation

## 2017-01-03 DIAGNOSIS — Z79899 Other long term (current) drug therapy: Secondary | ICD-10-CM | POA: Diagnosis not present

## 2017-01-03 DIAGNOSIS — I10 Essential (primary) hypertension: Secondary | ICD-10-CM | POA: Diagnosis not present

## 2017-01-03 DIAGNOSIS — R1084 Generalized abdominal pain: Secondary | ICD-10-CM | POA: Diagnosis present

## 2017-01-03 DIAGNOSIS — E119 Type 2 diabetes mellitus without complications: Secondary | ICD-10-CM | POA: Diagnosis not present

## 2017-01-03 LAB — URINALYSIS, ROUTINE W REFLEX MICROSCOPIC
BACTERIA UA: NONE SEEN
BILIRUBIN URINE: NEGATIVE
Glucose, UA: NEGATIVE mg/dL
KETONES UR: NEGATIVE mg/dL
LEUKOCYTES UA: NEGATIVE
Nitrite: NEGATIVE
Protein, ur: 100 mg/dL — AB
SPECIFIC GRAVITY, URINE: 1.026 (ref 1.005–1.030)
SQUAMOUS EPITHELIAL / LPF: NONE SEEN
pH: 6 (ref 5.0–8.0)

## 2017-01-03 LAB — COMPREHENSIVE METABOLIC PANEL
ALBUMIN: 4.5 g/dL (ref 3.5–5.0)
ALK PHOS: 55 U/L (ref 38–126)
ALT: 35 U/L (ref 17–63)
AST: 21 U/L (ref 15–41)
Anion gap: 12 (ref 5–15)
BUN: 21 mg/dL — AB (ref 6–20)
CALCIUM: 9.2 mg/dL (ref 8.9–10.3)
CO2: 20 mmol/L — ABNORMAL LOW (ref 22–32)
CREATININE: 1.16 mg/dL (ref 0.61–1.24)
Chloride: 107 mmol/L (ref 101–111)
GFR calc Af Amer: >60 mL/min (ref >60)
GFR calc non Af Amer: >60 mL/min (ref >60)
GLUCOSE: 180 mg/dL — AB (ref 65–99)
Potassium: 3.5 mmol/L (ref 3.5–5.1)
SODIUM: 139 mmol/L (ref 135–145)
Total Bilirubin: 0.6 mg/dL (ref 0.3–1.2)
Total Protein: 7.1 g/dL (ref 6.5–8.1)

## 2017-01-03 LAB — CBC WITH DIFFERENTIAL/PLATELET
BASOS PCT: 0 %
Basophils Absolute: 0 10*3/uL (ref 0.0–0.1)
EOS ABS: 0.3 10*3/uL (ref 0.0–0.7)
Eosinophils Relative: 3 %
HEMATOCRIT: 45.3 % (ref 39.0–52.0)
HEMOGLOBIN: 16 g/dL (ref 13.0–17.0)
Lymphocytes Relative: 32 %
Lymphs Abs: 3.4 10*3/uL (ref 0.7–4.0)
MCH: 29.7 pg (ref 26.0–34.0)
MCHC: 35.3 g/dL (ref 30.0–36.0)
MCV: 84 fL (ref 78.0–100.0)
Monocytes Absolute: 0.5 10*3/uL (ref 0.1–1.0)
Monocytes Relative: 5 %
NEUTROS ABS: 6.6 10*3/uL (ref 1.7–7.7)
NEUTROS PCT: 60 %
Platelets: 209 10*3/uL (ref 150–400)
RBC: 5.39 MIL/uL (ref 4.22–5.81)
RDW: 13.1 % (ref 11.5–15.5)
WBC: 10.8 10*3/uL — AB (ref 4.0–10.5)

## 2017-01-03 MED ORDER — TAMSULOSIN HCL 0.4 MG PO CAPS: 0.4000 mg | ORAL_CAPSULE | Freq: Two times a day (BID) | ORAL | 0 refills | Status: DC

## 2017-01-03 MED ORDER — HYDROMORPHONE HCL 1 MG/ML IJ SOLN
1.0000 mg | Freq: Once | INTRAMUSCULAR | Status: AC
  Administered 2017-01-03: 1 mg via INTRAVENOUS
  Filled 2017-01-03: qty 1

## 2017-01-03 MED ORDER — KETOROLAC TROMETHAMINE 30 MG/ML IJ SOLN
30.0000 mg | Freq: Once | INTRAMUSCULAR | Status: AC
  Administered 2017-01-03: 30 mg via INTRAVENOUS
  Filled 2017-01-03: qty 1

## 2017-01-03 MED ORDER — OXYCODONE-ACETAMINOPHEN 5-325 MG PO TABS: 1.0000 | ORAL_TABLET | ORAL | 0 refills | Status: DC | PRN

## 2017-01-03 MED ORDER — PROMETHAZINE HCL 25 MG PO TABS: 25.0000 mg | ORAL_TABLET | Freq: Four times a day (QID) | ORAL | 0 refills | Status: DC | PRN

## 2017-01-03 MED ORDER — NAPROXEN 500 MG PO TABS: 500.0000 mg | ORAL_TABLET | Freq: Two times a day (BID) | ORAL | 0 refills | Status: DC

## 2017-01-03 MED ORDER — OXYCODONE-ACETAMINOPHEN 5-325 MG PO TABS
2.0000 | ORAL_TABLET | Freq: Once | ORAL | Status: AC
  Administered 2017-01-03: 2 via ORAL
  Filled 2017-01-03: qty 2

## 2017-01-03 NOTE — ED Triage Notes (Signed)
Pt c/o right flank pain this am with frequent urination.

## 2017-01-03 NOTE — ED Provider Notes (Signed)
[place] DEPT Provider Note   CSN: [id number] Arrival date & time: 01/03/17  0720     History   Chief Complaint Chief Complaint  Patient presents with  . Flank Pain    HPI Joel Jefferson. is a 41 y.o. male.  HPI  The patient is a 41 year old male with a prior history of diabetes high cholesterol hypertension and a urethral stricture. He presents to the hospital with a complaint of some dysuria but also complains of right sided abdominal and flank pain which radiates to the groin - was told in the past that he may have a kidney stone - but it never passed (was in the kidney) though I don't have access to the reports that he is talking about.  He has starting last night developed pain in that right side seems to be radiating to the groin, denies hematuria. The pain became much worse and this morning, there is no vomiting but he is nauseated, no diaphoresis. Denies any abdominal surgical history.   Past Medical History:  Diagnosis Date  . Diabetes ([place])   . GERD (gastroesophageal reflux disease)   . Hypercholesterolemia   . Hypertension   . Urethral stricture     Patient Active Problem List   Diagnosis Date Noted  . Dysphagia 10/13/2014  . GERD (gastroesophageal reflux disease) 10/13/2014    Past Surgical History:  Procedure Laterality Date  . BALLOON DILATION  04/06/2012   Procedure: BALLOON DILATION;  Surgeon: Reece Packer, MD;  Location: [facility];  Service: Urology;  Laterality: N/A;  . ESOPHAGOGASTRODUODENOSCOPY (EGD) WITH PROPOFOL N/A 08/29/2015   Dr. Oneida Alar: possible proximal esophageal web s/p dilation, moderate gastritis, negative eosinophilic esophagitis   . SAVORY DILATION N/A 08/29/2015   Procedure: SAVORY DILATION;  Surgeon: Danie Binder, MD;  Location: [facility];  Service: Endoscopy;  Laterality: N/A;  . WISDOM TOOTH EXTRACTION         Home Medications    Prior to Admission medications   Medication Sig Start Date  End Date Taking? Authorizing Provider  albuterol (PROVENTIL HFA;VENTOLIN HFA) 108 (90 Base) MCG/ACT inhaler Inhale 2 puffs into the lungs every 4 (four) hours as needed for wheezing or shortness of breath. 04/05/15   Melony Overly, MD  cyclobenzaprine (FLEXERIL) 10 MG tablet Take 1 tablet by mouth at bedtime as needed for muscle spasms.  07/20/15   [provider]  ibuprofen (ADVIL,MOTRIN) 800 MG tablet Take 1 tablet by mouth 3 (three) times daily as needed for moderate pain.  07/20/15   [provider]  lisinopril (PRINIVIL,ZESTRIL) 40 MG tablet TAKE 1 TABLET BY MOUTH ONCE DAILY. 09/21/14   [provider]  Magnesium 500 MG CAPS Take by mouth.    [provider]  metFORMIN (GLUCOPHAGE) 500 MG tablet Take 500 mg by mouth 2 (two) times daily with a meal.    [provider]  naproxen (NAPROSYN) 500 MG tablet Take 1 tablet (500 mg total) by mouth 2 (two) times daily with a meal. 01/03/17   Noemi Chapel, MD  omeprazole (PRILOSEC) 20 MG capsule Take 20 mg by mouth daily.    [provider]  oxyCODONE-acetaminophen (PERCOCET) 5-325 MG tablet Take 1 tablet by mouth every 4 (four) hours as needed. 01/03/17   Noemi Chapel, MD  Potassium (POTASSIMIN PO) Take 550 mg by mouth daily.    [provider]  pravastatin (PRAVACHOL) 20 MG tablet TAKE 1 TABLET BY MOUTH ONCE DAILY. 08/30/14   [provider]  promethazine (PHENERGAN) 25 MG tablet Take 1 tablet (25 mg total) by mouth every 6 (six) hours as needed for nausea or vomiting. 01/03/17   Noemi Chapel, MD  tamsulosin (FLOMAX) 0.4 MG CAPS capsule Take 1 capsule (0.4 mg total) by mouth 2 (two) times daily. 01/03/17   Noemi Chapel, MD  topiramate (TOPAMAX) 100 MG tablet TK 1 T PO  BID 11/17/15   [provider]    Family History Family History  Problem Relation Age of Onset  . Colon cancer Neg Hx     Social History Social History  Substance Use Topics  . Smoking status: Former  Smoker    Packs/day: 0.25    Years: 9.00    Types: Cigarettes    Quit date: 07/27/2011  . Smokeless tobacco: Never Used  . Alcohol use No     Comment:  No ETOH since 04/11/15 (set a goal to quit ETOH). Previously about 4-5 beer per day on average;     Allergies   Patient has no known allergies.   Review of Systems Review of Systems  All other systems reviewed and are negative.    Physical Exam Updated Vital Signs BP (!) 167/104   Pulse 91   Temp 98.5 F (36.9 C)   Resp 18   Ht 5\' 10"  (1.778 m)   Wt 104.3 kg (230 lb)   SpO2 100%   BMI 33.00 kg/m   Physical Exam  Constitutional: He appears well-developed and well-nourished. No distress.  HENT:  Head: Normocephalic and atraumatic.  Mouth/Throat: Oropharynx is clear and moist. No oropharyngeal exudate.  Eyes: Pupils are equal, round, and reactive to light. Conjunctivae and EOM are normal. Right eye exhibits no discharge. Left eye exhibits no discharge. No scleral icterus.  Neck: Normal range of motion. Neck supple. No JVD present. No thyromegaly present.  Cardiovascular: Normal rate, regular rhythm, normal heart sounds and intact distal pulses.  Exam reveals no gallop and no friction rub.   No murmur heard. Pulmonary/Chest: Effort normal and breath sounds normal. No respiratory distress. He has no wheezes. He has no rales.  Abdominal: Soft. Bowel sounds are normal. He exhibits no distension and no mass. There is no tenderness.  There is no abdominal tenderness to palpation, no guarding, no peritoneal signs, no Murphy sign, no pain at McBurney's point, no hernia in the right inguinal region  Musculoskeletal: Normal range of motion. He exhibits no edema or tenderness.  Lymphadenopathy:    He has no cervical adenopathy.  Neurological: He is alert. Coordination normal.  Skin: Skin is warm and dry. No rash noted. No erythema.  Psychiatric: He has a normal mood and affect. His behavior is normal.  Nursing note and vitals  reviewed.    ED Treatments / Results  Labs (all labs ordered are listed, but only abnormal results are displayed) Labs Reviewed  URINALYSIS, ROUTINE W REFLEX MICROSCOPIC - Abnormal; Notable for the following:       Result Value   APPearance HAZY (*)    Hgb urine dipstick LARGE (*)    Protein, ur 100 (*)    All other components within normal limits  CBC WITH DIFFERENTIAL/PLATELET - Abnormal; Notable for the following:    WBC 10.8 (*)    All other components within normal limits  COMPREHENSIVE METABOLIC PANEL - Abnormal; Notable for the following:    CO2 20 (*)    Glucose, Bld 180 (*)    BUN 21 (*)    All other  components within normal limits     Radiology Ct Renal Stone Study  Result Date: 01/03/2017 CLINICAL DATA:  right flank pain this am with frequent urination Pt states that he was diagnosed with kidney stones last year but has yet to pass any, started having rt sided severe pain last night, no visible hematuria. EXAM: CT ABDOMEN AND PELVIS WITHOUT CONTRAST TECHNIQUE: Multidetector CT imaging of the abdomen and pelvis was performed following the standard protocol without IV contrast. COMPARISON:  04/26/2016 FINDINGS: Lower chest: No acute abnormality. Hepatobiliary: No focal liver abnormality is seen. No gallstones, gallbladder wall thickening, or biliary dilatation. Pancreas: Unremarkable. No pancreatic ductal dilatation or surrounding inflammatory changes. Spleen: Normal in size without focal abnormality. Adrenals/Urinary Tract: Normal adrenals. Normal renal contours. Bilateral nephrolithiasis, largest stone in the lower pole left kidney 1 cm. There is mild right hydronephrosis and ureterectasis to the ureteral orifice. There is a linear 5 mm calcification at the right ureteral orifice. Urinary bladder incompletely distended. Stomach/Bowel: Circumferential wall thickening in the distal esophagus, which is partially distended by gas, similar to previous exam. Stomach and small bowel  are decompressed. Normal appendix. The colon is nondilated, unremarkable. Vascular/Lymphatic: No significant vascular findings are present. No enlarged abdominal or pelvic lymph nodes. Reproductive: Mild prostatic enlargement with central calcification. Other: No ascites.  No free air. Musculoskeletal: No acute or significant osseous findings. IMPRESSION: 1. Partially obstructing 5 mm calculus at the right ureteral orifice. 2. Bilateral nephrolithiasis 3. Circumferential wall thickening in the distal esophagus as before, may represent esophagitis or neoplasm. Electronically Signed   By: Lucrezia Europe M.D.   On: 01/03/2017 08:47    Procedures Procedures (including critical care time)  Medications Ordered in ED Medications  HYDROmorphone (DILAUDID) injection 1 mg (1 mg Intravenous Given 01/03/17 0812)  ketorolac (TORADOL) 30 MG/ML injection 30 mg (30 mg Intravenous Given 01/03/17 0802)  oxyCODONE-acetaminophen (PERCOCET/ROXICET) 5-325 MG per tablet 2 tablet (2 tablets Oral Given 01/03/17 8315)     Initial Impression / Assessment and Plan / ED Course  I have reviewed the triage vital signs and the nursing notes.  Pertinent labs & imaging results that were available during my care of the patient were reviewed by me and considered in my medical decision making (see chart for details).     History and exam consistent with possible ureterolithiasis. CT renal study pending, labs to evaluate renal function and urinalysis for evaluation of infection. Pain medications at that ordered. The patient is in agreement with the plan.   Kidney stone seen on CT scan, 5 mm, at the ureteral orifice at the bladder, patient informed, well-appearing, given medications for home. No signs of renal dysfunction or urinary tract infection. Stable for discharge  Final Clinical Impressions(s) / ED Diagnoses   Final diagnoses:  Kidney stone  Ureteral colic    New Prescriptions New Prescriptions   NAPROXEN (NAPROSYN) 500  MG TABLET    Take 1 tablet (500 mg total) by mouth 2 (two) times daily with a meal.   OXYCODONE-ACETAMINOPHEN (PERCOCET) 5-325 MG TABLET    Take 1 tablet by mouth every 4 (four) hours as needed.   PROMETHAZINE (PHENERGAN) 25 MG TABLET    Take 1 tablet (25 mg total) by mouth every 6 (six) hours as needed for nausea or vomiting.   TAMSULOSIN (FLOMAX) 0.4 MG CAPS CAPSULE    Take 1 capsule (0.4 mg total) by mouth 2 (two) times daily.     Noemi Chapel, MD 01/03/17 [phone]

## 2017-01-03 NOTE — Discharge Instructions (Signed)

## 2017-01-09 DIAGNOSIS — R7309 Other abnormal glucose: Secondary | ICD-10-CM | POA: Diagnosis not present

## 2017-01-09 DIAGNOSIS — E782 Mixed hyperlipidemia: Secondary | ICD-10-CM | POA: Diagnosis not present

## 2017-01-09 DIAGNOSIS — I10 Essential (primary) hypertension: Secondary | ICD-10-CM | POA: Diagnosis not present

## 2017-01-09 DIAGNOSIS — Z1389 Encounter for screening for other disorder: Secondary | ICD-10-CM | POA: Diagnosis not present

## 2017-03-24 DIAGNOSIS — R102 Pelvic and perineal pain: Secondary | ICD-10-CM | POA: Diagnosis not present

## 2017-03-24 DIAGNOSIS — M545 Low back pain: Secondary | ICD-10-CM | POA: Diagnosis not present

## 2017-03-24 DIAGNOSIS — N200 Calculus of kidney: Secondary | ICD-10-CM | POA: Diagnosis not present

## 2017-05-02 DIAGNOSIS — R300 Dysuria: Secondary | ICD-10-CM | POA: Diagnosis not present

## 2017-05-02 DIAGNOSIS — R3912 Poor urinary stream: Secondary | ICD-10-CM | POA: Diagnosis not present

## 2017-05-02 DIAGNOSIS — N200 Calculus of kidney: Secondary | ICD-10-CM | POA: Diagnosis not present

## 2018-01-14 DIAGNOSIS — R39198 Other difficulties with micturition: Secondary | ICD-10-CM | POA: Diagnosis not present

## 2018-01-14 DIAGNOSIS — R102 Pelvic and perineal pain: Secondary | ICD-10-CM | POA: Diagnosis not present

## 2018-01-14 DIAGNOSIS — R1031 Right lower quadrant pain: Secondary | ICD-10-CM | POA: Diagnosis not present

## 2018-01-14 DIAGNOSIS — Z6830 Body mass index (BMI) 30.0-30.9, adult: Secondary | ICD-10-CM | POA: Diagnosis not present

## 2018-01-14 DIAGNOSIS — Z125 Encounter for screening for malignant neoplasm of prostate: Secondary | ICD-10-CM | POA: Diagnosis not present

## 2018-01-14 DIAGNOSIS — N200 Calculus of kidney: Secondary | ICD-10-CM | POA: Diagnosis not present

## 2018-01-14 DIAGNOSIS — R7309 Other abnormal glucose: Secondary | ICD-10-CM | POA: Diagnosis not present

## 2018-02-06 DIAGNOSIS — N200 Calculus of kidney: Secondary | ICD-10-CM | POA: Diagnosis not present

## 2018-02-06 DIAGNOSIS — K6389 Other specified diseases of intestine: Secondary | ICD-10-CM | POA: Diagnosis not present

## 2018-02-06 DIAGNOSIS — K5900 Constipation, unspecified: Secondary | ICD-10-CM | POA: Diagnosis not present

## 2018-02-17 ENCOUNTER — Encounter: Payer: Self-pay | Admitting: Gastroenterology

## 2018-02-17 ENCOUNTER — Ambulatory Visit (INDEPENDENT_AMBULATORY_CARE_PROVIDER_SITE_OTHER): Payer: BLUE CROSS/BLUE SHIELD | Admitting: Gastroenterology

## 2018-02-17 ENCOUNTER — Other Ambulatory Visit: Payer: Self-pay

## 2018-02-17 DIAGNOSIS — R1032 Left lower quadrant pain: Secondary | ICD-10-CM | POA: Diagnosis not present

## 2018-02-17 DIAGNOSIS — K625 Hemorrhage of anus and rectum: Secondary | ICD-10-CM | POA: Diagnosis not present

## 2018-02-17 DIAGNOSIS — R933 Abnormal findings on diagnostic imaging of other parts of digestive tract: Secondary | ICD-10-CM

## 2018-02-17 DIAGNOSIS — K5900 Constipation, unspecified: Secondary | ICD-10-CM

## 2018-02-17 MED ORDER — PEG 3350-KCL-NA BICARB-NACL 420 G PO SOLR: 4000.0000 mL | ORAL | 0 refills | Status: DC

## 2018-02-17 MED ORDER — LINACLOTIDE 145 MCG PO CAPS: 145.0000 ug | ORAL_CAPSULE | Freq: Every day | ORAL | 3 refills | Status: DC

## 2018-02-17 NOTE — Assessment & Plan Note (Signed)
42 year old gentleman with several month history of constant left lower quadrant pain, worse postprandially.  Change in bowel habits.  Tendency towards constipation, unproductive stools.  Passes some stool throughout the day but never seems to be enough.  Constant sensation of needing to have a bowel movement.  Stools are hard.  Fresh blood per rectum frequently.  Moderately constipated on recent CT.  Focal sigmoid wall thickening without surrounding inflammation also noted.  Colonoscopy with deep sedation in the near future.  I have discussed the risks, alternatives, benefits with regards to but not limited to the risk of reaction to medication, bleeding, infection, perforation and the patient is agreeable to proceed. Written consent to be obtained.  Add Linzess 145 mcg daily on empty stomach.  Hold for 1 to 2 days if develops frequent loose stools.

## 2018-02-17 NOTE — Progress Notes (Addendum)
Primary Care Physician:  Sharilyn Sites, MD  Primary Gastroenterologist:  Barney Drain, MD  REVIEWED-NO ADDITIONAL RECOMMENDATIONS.  Chief Complaint  Patient presents with  . Abdominal Pain    Lower abd- stabbing pain. Had CT done and was told he was constipated. When he eats the pain is worse  . Rectal Bleeding    has hemorrhoids. Straining with BM    HPI:  Joel Jefferson. is a 42 y.o. male here for further evaluation of abdominal pain and rectal bleeding.  He was last seen in 2017 for chronic GERD.  H/O esophageal web status post dilation, gastritis, negative biopsies for eosinophilic esophagitis.  Patient states he has been struggling with abdominal pain for several months.  Initially noted right groin pain, hernia developing.  Over the past couple months he has had left lower quadrant pain.  Pain is there constantly.  At times worse than others.  Definitely worse with meals.  Feels like he needs to have a bowel movement all the time.  Will pass small hard nonproductive stools several times per day.  Painful when he has a bowel movement.  Some fresh blood per rectum.  He knows he has hemorrhoids.  Denies any nausea or vomiting.  No melena.  Intentional weight loss of 35 pounds with keto diet since January.  He had a CT abdomen pelvis with oral contrast only earlier this month.  See below.  He was found to have moderate constipation and sigmoid wall thickening.    Current Outpatient Medications  Medication Sig Dispense Refill  . lisinopril (PRINIVIL,ZESTRIL) 10 MG tablet Take 10 mg by mouth daily.   5  . Magnesium 500 MG CAPS Take by mouth daily.     . Potassium (POTASSIMIN PO) Take 550 mg by mouth daily.    . pravastatin (PRAVACHOL) 20 MG tablet TAKE 1 TABLET BY MOUTH ONCE DAILY.  1   No current facility-administered medications for this visit.     Allergies as of 02/17/2018  . (No Known Allergies)    Past Medical History:  Diagnosis Date  . Diabetes ([place])   . GERD  (gastroesophageal reflux disease)   . Hypercholesterolemia   . Hypertension   . Urethral stricture     Past Surgical History:  Procedure Laterality Date  . BALLOON DILATION  04/06/2012   Procedure: BALLOON DILATION;  Surgeon: Reece Packer, MD;  Location: [facility];  Service: Urology;  Laterality: N/A;  . ESOPHAGOGASTRODUODENOSCOPY (EGD) WITH PROPOFOL N/A 08/29/2015   Dr. Oneida Alar: possible proximal esophageal web s/p dilation, moderate gastritis, negative eosinophilic esophagitis   . SAVORY DILATION N/A 08/29/2015   Procedure: SAVORY DILATION;  Surgeon: Danie Binder, MD;  Location: [facility];  Service: Endoscopy;  Laterality: N/A;  . WISDOM TOOTH EXTRACTION      Family History  Problem Relation Age of Onset  . Colon cancer Neg Hx     Social History   Socioeconomic History  . Marital status: Divorced    Spouse name: Not on file  . Number of children: Not on file  . Years of education: Not on file  . Highest education level: Not on file  Occupational History  . Not on file  Social Needs  . Financial resource strain: Not on file  . Food insecurity:    Worry: Not on file    Inability: Not on file  . Transportation needs:    Medical: Not on file    Non-medical: Not on file  Tobacco  Use  . Smoking status: Former Smoker    Packs/day: 0.25    Years: 9.00    Pack years: 2.25    Types: Cigarettes    Last attempt to quit: 07/27/2011    Years since quitting: 6.5  . Smokeless tobacco: Never Used  Substance and Sexual Activity  . Alcohol use: No    Alcohol/week: 6.0 standard drinks    Types: 6 Cans of beer per week    Comment:  No ETOH since 04/11/15 (set a goal to quit ETOH). Previously about 4-5 beer per day on average;  . Drug use: No  . Sexual activity: Yes    Birth control/protection: None  Lifestyle  . Physical activity:    Days per week: Not on file    Minutes per session: Not on file  . Stress: Not on file  Relationships  . Social  connections:    Talks on phone: Not on file    Gets together: Not on file    Attends religious service: Not on file    Active member of club or organization: Not on file    Attends meetings of clubs or organizations: Not on file    Relationship status: Not on file  . Intimate partner violence:    Fear of current or ex partner: Not on file    Emotionally abused: Not on file    Physically abused: Not on file    Forced sexual activity: Not on file  Other Topics Concern  . Not on file  Social History Narrative  . Not on file      ROS:  General: Negative for anorexia, weight loss, fever, chills, fatigue, weakness. Eyes: Negative for vision changes.  ENT: Negative for hoarseness, difficulty swallowing , nasal congestion. CV: Negative for chest pain, angina, palpitations, dyspnea on exertion, peripheral edema.  Respiratory: Negative for dyspnea at rest, dyspnea on exertion, cough, sputum, wheezing.  GI: See history of present illness. GU:  Negative for dysuria, hematuria, urinary incontinence, urinary frequency, nocturnal urination.  MS: Negative for joint pain, low back pain.  Derm: Negative for rash or itching.  Neuro: Negative for weakness, abnormal sensation, seizure, frequent headaches, memory loss, confusion.  Psych: Negative for anxiety, depression, suicidal ideation, hallucinations.  Endo: Negative for unusual weight change.  Heme: Negative for bruising or bleeding. Allergy: Negative for rash or hives.    Physical Examination:  BP 136/88   Pulse 85   Temp 98.6 F (37 C) (Oral)   Ht 5\' 10"  (1.778 m)   Wt 223 lb 3.2 oz (101.2 kg)   BMI 32.03 kg/m    General: Well-nourished, well-developed in no acute distress.  Head: Normocephalic, atraumatic.   Eyes: Conjunctiva pink, no icterus. Mouth: Oropharyngeal mucosa moist and pink , no lesions erythema or exudate. Neck: Supple without thyromegaly, masses, or lymphadenopathy.  Lungs: Clear to auscultation bilaterally.   Heart: Regular rate and rhythm, no murmurs rubs or gallops.  Abdomen: Bowel sounds are normal, mild left lower quadrant tenderness, nondistended, no hepatosplenomegaly or masses, no abdominal bruits or    hernia , no rebound or guarding.   Rectal: Deferred Extremities: No lower extremity edema. No clubbing or deformities.  Neuro: Alert and oriented x 4 , grossly normal neurologically.  Skin: Warm and dry, no rash or jaundice.   Psych: Alert and cooperative, normal mood and affect.  Labs: Labs from 01/14/2018  A1c 5.7  Imaging Studies: CT abdomen pelvis without contrast dated 02/06/2018 Nonobstructing left renal stones.  Moderate constipation.  Focal thickening of the wall the sigmoid colon without surrounding inflammatory change.  Consider colonoscopy.

## 2018-02-17 NOTE — Patient Instructions (Addendum)
1. Colonoscopy as scheduled.  Please see separate instructions. 2. Start Linzess 145 mcg daily on an empty stomach to help with constipation.  If you develop frequent loose stools, you can skip a day or 2.  Samples provided.  Prescription sent to pharmacy.

## 2018-02-17 NOTE — Progress Notes (Signed)
cc'ed to pcp °

## 2018-02-19 NOTE — Patient Instructions (Signed)
Joel Jefferson.  02/19/2018     @PREFPERIOPPHARMACY @   Your procedure is scheduled on  02/24/2018 .  Report to [facility] at  745  A.M.  Call this number if you have problems the morning of surgery:  [phone]   Remember:  Follow the diet and prep instructions given to you by Dr Nona Dell office.                     Take these medicines the morning of surgery with A SIP OF WATER  Lisinopril, prilosec, topamax.    Do not wear jewelry, make-up or nail polish.  Do not wear lotions, powders, or perfumes, or deodorant.  Do not shave 48 hours prior to surgery.  Men may shave face and neck.  Do not bring valuables to the hospital.  [facility] is not responsible for any belongings or valuables.  Contacts, dentures or bridgework may not be worn into surgery.  Leave your suitcase in the car.  After surgery it may be brought to your room.  For patients admitted to the hospital, discharge time will be determined by your treatment team.  Patients discharged the day of surgery will not be allowed to drive home.   Name and phone number of your driver:   family Special instructions:  None  Please read over the following fact sheets that you were given. Anesthesia Post-op Instructions and Care and Recovery After Surgery       Colonoscopy, Adult A colonoscopy is an exam to look at the large intestine. It is done to check for problems, such as:  Lumps (tumors).  Growths (polyps).  Swelling (inflammation).  Bleeding.  What happens before the procedure? Eating and drinking Follow instructions from your doctor about eating and drinking. These instructions may include:  A few days before the procedure - follow a low-fiber diet. ? Avoid nuts. ? Avoid seeds. ? Avoid dried fruit. ? Avoid raw fruits. ? Avoid vegetables.  1-3 days before the procedure - follow a clear liquid diet. Avoid liquids that have red or purple dye. Drink only clear liquids, such  as: ? Clear broth or bouillon. ? Black coffee or tea. ? Clear juice. ? Clear soft drinks or sports drinks. ? Gelatin dessert. ? Popsicles.  On the day of the procedure - do not eat or drink anything during the 2 hours before the procedure.  Bowel prep If you were prescribed an oral bowel prep:  Take it as told by your doctor. Starting the day before your procedure, you will need to drink a lot of liquid. The liquid will cause you to poop (have bowel movements) until your poop is almost clear or light green.  If your skin or butt gets irritated from diarrhea, you may: ? Wipe the area with wipes that have medicine in them, such as adult wet wipes with aloe and vitamin E. ? Put something on your skin that soothes the area, such as petroleum jelly.  If you throw up (vomit) while drinking the bowel prep, take a break for up to 60 minutes. Then begin the bowel prep again. If you keep throwing up and you cannot take the bowel prep without throwing up, call your doctor.  General instructions  Ask your doctor about changing or stopping your normal medicines. This is important if you take diabetes medicines or blood thinners.  Plan to have someone take you home from the  hospital or clinic. What happens during the procedure?  An IV tube may be put into one of your veins.  You will be given medicine to help you relax (sedative).  To reduce your risk of infection: ? Your doctors will wash their hands. ? Your anal area will be washed with soap.  You will be asked to lie on your side with your knees bent.  Your doctor will get a long, thin, flexible tube ready. The tube will have a camera and a light on the end.  The tube will be put into your anus.  The tube will be gently put into your large intestine.  Air will be delivered into your large intestine to keep it open. You may feel some pressure or cramping.  The camera will be used to take photos.  A small tissue sample may be  removed from your body to be looked at under a microscope (biopsy). If any possible problems are found, the tissue will be sent to a lab for testing.  If small growths are found, your doctor may remove them and have them checked for cancer.  The tube that was put into your anus will be slowly removed. The procedure may vary among doctors and hospitals. What happens after the procedure?  Your doctor will check on you often until the medicines you were given have worn off.  Do not drive for 24 hours after the procedure.  You may have a small amount of blood in your poop.  You may pass gas.  You may have mild cramps or bloating in your belly (abdomen).  It is up to you to get the results of your procedure. Ask your doctor, or the department performing the procedure, when your results will be ready. This information is not intended to replace advice given to you by your health care provider. Make sure you discuss any questions you have with your health care provider. Document Released: 04/27/2010 Document Revised: 01/24/2016 Document Reviewed: 06/06/2015 Elsevier Interactive Patient Education  2017 [employer]  Colonoscopy, Adult, Care After This sheet gives you information about how to care for yourself after your procedure. Your health care provider may also give you more specific instructions. If you have problems or questions, contact your health care provider. What can I expect after the procedure? After the procedure, it is common to have:  A small amount of blood in your stool for 24 hours after the procedure.  Some gas.  Mild abdominal cramping or bloating.  Follow these instructions at home: General instructions   For the first 24 hours after the procedure: ? Do not drive or use machinery. ? Do not sign important documents. ? Do not drink alcohol. ? Do your regular daily activities at a slower pace than normal. ? Eat soft, easy-to-digest foods. ? Rest  often.  Take over-the-counter or prescription medicines only as told by your health care provider.  It is up to you to get the results of your procedure. Ask your health care provider, or the department performing the procedure, when your results will be ready. Relieving cramping and bloating  Try walking around when you have cramps or feel bloated.  Apply heat to your abdomen as told by your health care provider. Use a heat source that your health care provider recommends, such as a moist heat pack or a heating pad. ? Place a towel between your skin and the heat source. ? Leave the heat on for 20-30 minutes. ? Remove the  heat if your skin turns bright red. This is especially important if you are unable to feel pain, heat, or cold. You may have a greater risk of getting burned. Eating and drinking  Drink enough fluid to keep your urine clear or pale yellow.  Resume your normal diet as instructed by your health care provider. Avoid heavy or fried foods that are hard to digest.  Avoid drinking alcohol for as long as instructed by your health care provider. Contact a health care provider if:  You have blood in your stool 2-3 days after the procedure. Get help right away if:  You have more than a small spotting of blood in your stool.  You pass large blood clots in your stool.  Your abdomen is swollen.  You have nausea or vomiting.  You have a fever.  You have increasing abdominal pain that is not relieved with medicine. This information is not intended to replace advice given to you by your health care provider. Make sure you discuss any questions you have with your health care provider. Document Released: 11/07/2003 Document Revised: 12/18/2015 Document Reviewed: 06/06/2015 Elsevier Interactive Patient Education  2018 [place] Anesthesia is a term that refers to techniques, procedures, and medicines that help a person stay safe and comfortable  during a medical procedure. Monitored anesthesia care, or sedation, is one type of anesthesia. Your anesthesia specialist may recommend sedation if you will be having a procedure that does not require you to be unconscious, such as:  Cataract surgery.  A dental procedure.  A biopsy.  A colonoscopy.  During the procedure, you may receive a medicine to help you relax (sedative). There are three levels of sedation:  Mild sedation. At this level, you may feel awake and relaxed. You will be able to follow directions.  Moderate sedation. At this level, you will be sleepy. You may not remember the procedure.  Deep sedation. At this level, you will be asleep. You will not remember the procedure.  The more medicine you are given, the deeper your level of sedation will be. Depending on how you respond to the procedure, the anesthesia specialist may change your level of sedation or the type of anesthesia to fit your needs. An anesthesia specialist will monitor you closely during the procedure. Let your health care provider know about:  Any allergies you have.  All medicines you are taking, including vitamins, herbs, eye drops, creams, and over-the-counter medicines.  Any use of steroids (by mouth or as a cream).  Any problems you or family members have had with sedatives and anesthetic medicines.  Any blood disorders you have.  Any surgeries you have had.  Any medical conditions you have, such as sleep apnea.  Whether you are pregnant or may be pregnant.  Any use of cigarettes, alcohol, or street drugs. What are the risks? Generally, this is a safe procedure. However, problems may occur, including:  Getting too much medicine (oversedation).  Nausea.  Allergic reaction to medicines.  Trouble breathing. If this happens, a breathing tube may be used to help with breathing. It will be removed when you are awake and breathing on your own.  Heart trouble.  Lung trouble.  Before  the procedure Staying hydrated Follow instructions from your health care provider about hydration, which may include:  Up to 2 hours before the procedure - you may continue to drink clear liquids, such as water, clear fruit juice, black coffee, and plain tea.  Eating and  drinking restrictions Follow instructions from your health care provider about eating and drinking, which may include:  8 hours before the procedure - stop eating heavy meals or foods such as meat, fried foods, or fatty foods.  6 hours before the procedure - stop eating light meals or foods, such as toast or cereal.  6 hours before the procedure - stop drinking milk or drinks that contain milk.  2 hours before the procedure - stop drinking clear liquids.  Medicines Ask your health care provider about:  Changing or stopping your regular medicines. This is especially important if you are taking diabetes medicines or blood thinners.  Taking medicines such as aspirin and ibuprofen. These medicines can thin your blood. Do not take these medicines before your procedure if your health care provider instructs you not to.  Tests and exams  You will have a physical exam.  You may have blood tests done to show: ? How well your kidneys and liver are working. ? How well your blood can clot.  General instructions  Plan to have someone take you home from the hospital or clinic.  If you will be going home right after the procedure, plan to have someone with you for 24 hours.  What happens during the procedure?  Your blood pressure, heart rate, breathing, level of pain and overall condition will be monitored.  An IV tube will be inserted into one of your veins.  Your anesthesia specialist will give you medicines as needed to keep you comfortable during the procedure. This may mean changing the level of sedation.  The procedure will be performed. After the procedure  Your blood pressure, heart rate, breathing rate, and  blood oxygen level will be monitored until the medicines you were given have worn off.  Do not drive for 24 hours if you received a sedative.  You may: ? Feel sleepy, clumsy, or nauseous. ? Feel forgetful about what happened after the procedure. ? Have a sore throat if you had a breathing tube during the procedure. ? Vomit. This information is not intended to replace advice given to you by your health care provider. Make sure you discuss any questions you have with your health care provider. Document Released: 12/19/2004 Document Revised: 09/01/2015 Document Reviewed: 07/16/2015 Elsevier Interactive Patient Education  2018 [place], Care After These instructions provide you with information about caring for yourself after your procedure. Your health care provider may also give you more specific instructions. Your treatment has been planned according to current medical practices, but problems sometimes occur. Call your health care provider if you have any problems or questions after your procedure. What can I expect after the procedure? After your procedure, it is common to:  Feel sleepy for several hours.  Feel clumsy and have poor balance for several hours.  Feel forgetful about what happened after the procedure.  Have poor judgment for several hours.  Feel nauseous or vomit.  Have a sore throat if you had a breathing tube during the procedure.  Follow these instructions at home: For at least 24 hours after the procedure:   Do not: ? Participate in activities in which you could fall or become injured. ? Drive. ? Use heavy machinery. ? Drink alcohol. ? Take sleeping pills or medicines that cause drowsiness. ? Make important decisions or sign legal documents. ? Take care of children on your own.  Rest. Eating and drinking  Follow the diet that is recommended by your health care  provider.  If you vomit, drink water, juice, or soup when you  can drink without vomiting.  Make sure you have little or no nausea before eating solid foods. General instructions  Have a responsible adult stay with you until you are awake and alert.  Take over-the-counter and prescription medicines only as told by your health care provider.  If you smoke, do not smoke without supervision.  Keep all follow-up visits as told by your health care provider. This is important. Contact a health care provider if:  You keep feeling nauseous or you keep vomiting.  You feel light-headed.  You develop a rash.  You have a fever. Get help right away if:  You have trouble breathing. This information is not intended to replace advice given to you by your health care provider. Make sure you discuss any questions you have with your health care provider. Document Released: 07/16/2015 Document Revised: 11/15/2015 Document Reviewed: 07/16/2015 Elsevier Interactive Patient Education  [employer].

## 2018-02-20 ENCOUNTER — Encounter (HOSPITAL_COMMUNITY)
Admission: RE | Admit: 2018-02-20 | Discharge: 2018-02-20 | Disposition: A | Discharge status: Home / Self Care | Payer: BLUE CROSS/BLUE SHIELD | Source: Ambulatory Visit | Attending: Gastroenterology | Admitting: Gastroenterology

## 2018-02-20 ENCOUNTER — Encounter (HOSPITAL_COMMUNITY): Payer: Self-pay

## 2018-02-20 ENCOUNTER — Other Ambulatory Visit: Payer: Self-pay

## 2018-02-20 DIAGNOSIS — Z01818 Encounter for other preprocedural examination: Secondary | ICD-10-CM | POA: Insufficient documentation

## 2018-02-20 HISTORY — DX: Personal history of urinary calculi: Z87.442

## 2018-02-20 LAB — BASIC METABOLIC PANEL
ANION GAP: 8 (ref 5–15)
BUN: 16 mg/dL (ref 6–20)
CALCIUM: 9.1 mg/dL (ref 8.9–10.3)
CO2: 20 mmol/L — ABNORMAL LOW (ref 22–32)
Chloride: 114 mmol/L — ABNORMAL HIGH (ref 98–111)
Creatinine, Ser: 0.84 mg/dL (ref 0.61–1.24)
Glucose, Bld: 133 mg/dL — ABNORMAL HIGH (ref 70–99)
Potassium: 3.9 mmol/L (ref 3.5–5.1)
Sodium: 142 mmol/L (ref 135–145)

## 2018-02-20 LAB — CBC WITH DIFFERENTIAL/PLATELET
Abs Immature Granulocytes: 0.03 10*3/uL (ref 0.00–0.07)
BASOS ABS: 0.1 10*3/uL (ref 0.0–0.1)
BASOS PCT: 1 %
EOS ABS: 0.2 10*3/uL (ref 0.0–0.5)
Eosinophils Relative: 3 %
HCT: 48.5 % (ref 39.0–52.0)
Hemoglobin: 16.3 g/dL (ref 13.0–17.0)
IMMATURE GRANULOCYTES: 0 %
Lymphocytes Relative: 28 %
Lymphs Abs: 2.1 10*3/uL (ref 0.7–4.0)
MCH: 28.6 pg (ref 26.0–34.0)
MCHC: 33.6 g/dL (ref 30.0–36.0)
MCV: 85.2 fL (ref 80.0–100.0)
Monocytes Absolute: 0.4 10*3/uL (ref 0.1–1.0)
Monocytes Relative: 5 %
NEUTROS ABS: 5 10*3/uL (ref 1.7–7.7)
NEUTROS PCT: 63 %
NRBC: 0 % (ref 0.0–0.2)
PLATELETS: 223 10*3/uL (ref 150–400)
RBC: 5.69 MIL/uL (ref 4.22–5.81)
RDW: 13.3 % (ref 11.5–15.5)
WBC: 7.7 10*3/uL (ref 4.0–10.5)

## 2018-02-23 NOTE — Progress Notes (Signed)
cc'd to pcp 

## 2018-02-23 NOTE — Progress Notes (Signed)
CC'D TO PCP °

## 2018-02-23 NOTE — Progress Notes (Signed)
LMOM to return call.

## 2018-02-23 NOTE — Progress Notes (Signed)
PT is aware.

## 2018-02-24 ENCOUNTER — Encounter (HOSPITAL_COMMUNITY): Payer: Self-pay | Admitting: *Deleted

## 2018-02-24 ENCOUNTER — Other Ambulatory Visit: Payer: Self-pay

## 2018-02-24 ENCOUNTER — Ambulatory Visit (HOSPITAL_COMMUNITY): Payer: BLUE CROSS/BLUE SHIELD | Admitting: Anesthesiology

## 2018-02-24 ENCOUNTER — Ambulatory Visit (HOSPITAL_COMMUNITY)
Admission: RE | Admit: 2018-02-24 | Discharge: 2018-02-24 | Disposition: A | Discharge status: Home / Self Care | Payer: BLUE CROSS/BLUE SHIELD | Source: Ambulatory Visit | Attending: Gastroenterology | Admitting: Gastroenterology

## 2018-02-24 ENCOUNTER — Telehealth: Payer: Self-pay | Admitting: *Deleted

## 2018-02-24 ENCOUNTER — Encounter: Payer: Self-pay | Admitting: General Surgery

## 2018-02-24 ENCOUNTER — Other Ambulatory Visit: Payer: Self-pay | Admitting: General Surgery

## 2018-02-24 ENCOUNTER — Encounter (HOSPITAL_COMMUNITY)
Admission: RE | Disposition: A | Discharge status: Home / Self Care | Payer: Self-pay | Source: Ambulatory Visit | Attending: Gastroenterology

## 2018-02-24 ENCOUNTER — Ambulatory Visit (INDEPENDENT_AMBULATORY_CARE_PROVIDER_SITE_OTHER): Payer: BLUE CROSS/BLUE SHIELD | Admitting: General Surgery

## 2018-02-24 VITALS — BP 140/86 | HR 89 | Temp 98.0°F | Resp 18 | Wt 219.0 lb

## 2018-02-24 DIAGNOSIS — D490 Neoplasm of unspecified behavior of digestive system: Secondary | ICD-10-CM | POA: Diagnosis not present

## 2018-02-24 DIAGNOSIS — K921 Melena: Secondary | ICD-10-CM | POA: Insufficient documentation

## 2018-02-24 DIAGNOSIS — Q438 Other specified congenital malformations of intestine: Secondary | ICD-10-CM | POA: Diagnosis not present

## 2018-02-24 DIAGNOSIS — Z87442 Personal history of urinary calculi: Secondary | ICD-10-CM | POA: Diagnosis not present

## 2018-02-24 DIAGNOSIS — C187 Malignant neoplasm of sigmoid colon: Secondary | ICD-10-CM | POA: Insufficient documentation

## 2018-02-24 DIAGNOSIS — Z79899 Other long term (current) drug therapy: Secondary | ICD-10-CM | POA: Insufficient documentation

## 2018-02-24 DIAGNOSIS — K6389 Other specified diseases of intestine: Secondary | ICD-10-CM | POA: Diagnosis not present

## 2018-02-24 DIAGNOSIS — K648 Other hemorrhoids: Secondary | ICD-10-CM | POA: Diagnosis not present

## 2018-02-24 DIAGNOSIS — R1032 Left lower quadrant pain: Secondary | ICD-10-CM

## 2018-02-24 DIAGNOSIS — K219 Gastro-esophageal reflux disease without esophagitis: Secondary | ICD-10-CM | POA: Insufficient documentation

## 2018-02-24 DIAGNOSIS — I10 Essential (primary) hypertension: Secondary | ICD-10-CM | POA: Diagnosis not present

## 2018-02-24 DIAGNOSIS — K5669 Other partial intestinal obstruction: Secondary | ICD-10-CM | POA: Diagnosis not present

## 2018-02-24 DIAGNOSIS — Z87891 Personal history of nicotine dependence: Secondary | ICD-10-CM | POA: Insufficient documentation

## 2018-02-24 DIAGNOSIS — R109 Unspecified abdominal pain: Secondary | ICD-10-CM | POA: Diagnosis not present

## 2018-02-24 DIAGNOSIS — R933 Abnormal findings on diagnostic imaging of other parts of digestive tract: Secondary | ICD-10-CM

## 2018-02-24 DIAGNOSIS — K625 Hemorrhage of anus and rectum: Secondary | ICD-10-CM

## 2018-02-24 DIAGNOSIS — K644 Residual hemorrhoidal skin tags: Secondary | ICD-10-CM | POA: Insufficient documentation

## 2018-02-24 DIAGNOSIS — E7800 Pure hypercholesterolemia, unspecified: Secondary | ICD-10-CM | POA: Insufficient documentation

## 2018-02-24 DIAGNOSIS — K5900 Constipation, unspecified: Secondary | ICD-10-CM

## 2018-02-24 HISTORY — PX: COLONOSCOPY WITH PROPOFOL: SHX5780

## 2018-02-24 HISTORY — PX: BIOPSY: SHX5522

## 2018-02-24 LAB — HEPATIC FUNCTION PANEL
ALBUMIN: 4.2 g/dL (ref 3.5–5.0)
ALK PHOS: 47 U/L (ref 38–126)
ALT: 31 U/L (ref 0–44)
AST: 22 U/L (ref 15–41)
BILIRUBIN INDIRECT: 0.5 mg/dL (ref 0.3–0.9)
Bilirubin, Direct: 0.1 mg/dL (ref 0.0–0.2)
TOTAL PROTEIN: 6.9 g/dL (ref 6.5–8.1)
Total Bilirubin: 0.6 mg/dL (ref 0.3–1.2)

## 2018-02-24 LAB — PROTIME-INR
INR: 1.04
PROTHROMBIN TIME: 13.5 s (ref 11.4–15.2)

## 2018-02-24 SURGERY — COLONOSCOPY WITH PROPOFOL
Anesthesia: General

## 2018-02-24 MED ORDER — SPOT INK MARKER SYRINGE KIT
PACK | SUBMUCOSAL | Status: AC
  Filled 2018-02-24: qty 5

## 2018-02-24 MED ORDER — SPOT INK MARKER SYRINGE KIT
PACK | SUBMUCOSAL | Status: DC | PRN
  Administered 2018-02-24 (×2): 1 mL via SUBMUCOSAL

## 2018-02-24 MED ORDER — LACTATED RINGERS IV SOLN
INTRAVENOUS | Status: DC
  Administered 2018-02-24: 09:00:00 via INTRAVENOUS

## 2018-02-24 MED ORDER — CHLORHEXIDINE GLUCONATE CLOTH 2 % EX PADS: 6.0000 | MEDICATED_PAD | Freq: Once | CUTANEOUS | Status: DC

## 2018-02-24 MED ORDER — PROMETHAZINE HCL 25 MG/ML IJ SOLN: 6.2500 mg | INTRAMUSCULAR | Status: DC | PRN

## 2018-02-24 MED ORDER — MIDAZOLAM HCL 2 MG/2ML IJ SOLN: 0.5000 mg | Freq: Once | INTRAMUSCULAR | Status: DC | PRN

## 2018-02-24 MED ORDER — MIDAZOLAM HCL 2 MG/2ML IJ SOLN
INTRAMUSCULAR | Status: AC
  Filled 2018-02-24: qty 2

## 2018-02-24 MED ORDER — HYDROMORPHONE HCL 1 MG/ML IJ SOLN: 0.2500 mg | INTRAMUSCULAR | Status: DC | PRN

## 2018-02-24 MED ORDER — MIDAZOLAM HCL 5 MG/5ML IJ SOLN
INTRAMUSCULAR | Status: DC | PRN
  Administered 2018-02-24: 2 mg via INTRAVENOUS

## 2018-02-24 MED ORDER — HYDROCODONE-ACETAMINOPHEN 7.5-325 MG PO TABS: 1.0000 | ORAL_TABLET | Freq: Once | ORAL | Status: DC | PRN

## 2018-02-24 MED ORDER — PROPOFOL 500 MG/50ML IV EMUL
INTRAVENOUS | Status: DC | PRN
  Administered 2018-02-24: 150 ug/kg/min via INTRAVENOUS
  Administered 2018-02-24: 10:00:00 via INTRAVENOUS

## 2018-02-24 MED ORDER — PROPOFOL 10 MG/ML IV BOLUS
INTRAVENOUS | Status: DC | PRN
  Administered 2018-02-24: 150 mg via INTRAVENOUS
  Administered 2018-02-24: 20 mg via INTRAVENOUS
  Administered 2018-02-24: 30 mg via INTRAVENOUS

## 2018-02-24 NOTE — Transfer of Care (Signed)
Immediate Anesthesia Transfer of Care Note  Patient: Joel Jefferson.  Procedure(s) Performed: COLONOSCOPY WITH PROPOFOL (N/A ) BIOPSY  Patient Location: PACU  Anesthesia Type:MAC  Level of Consciousness: awake  Airway & Oxygen Therapy: Patient Spontanous Breathing  Post-op Assessment: Report given to RN  Post vital signs: Reviewed  Last Vitals:  Vitals Value Taken Time  BP 117/83 02/24/2018 10:04 AM  Temp    Pulse 77 02/24/2018 10:07 AM  Resp 19 02/24/2018 10:07 AM  SpO2 99 % 02/24/2018 10:07 AM  Vitals shown include unvalidated device data.  Last Pain:  Vitals:   02/24/18 0922  TempSrc:   PainSc: 0-No pain         Complications: No apparent anesthesia complications

## 2018-02-24 NOTE — Anesthesia Postprocedure Evaluation (Signed)
Anesthesia Post Note  Patient: Joel Jefferson.  Procedure(s) Performed: COLONOSCOPY WITH PROPOFOL (N/A ) BIOPSY  Patient location during evaluation: PACU Anesthesia Type: General Level of consciousness: awake and alert and oriented Pain management: pain level controlled Vital Signs Assessment: post-procedure vital signs reviewed and stable Respiratory status: spontaneous breathing Cardiovascular status: blood pressure returned to baseline Postop Assessment: no apparent nausea or vomiting and adequate PO intake Anesthetic complications: no     Last Vitals:  Vitals:   02/24/18 0718 02/24/18 1002  BP: (!) 131/91 117/83  Pulse: 74 82  Resp: 18 16  Temp: 36.9 C 36.5 C  SpO2: 100% 98%    Last Pain:  Vitals:   02/24/18 1002  TempSrc:   PainSc: 0-No pain                 Caidance Sybert

## 2018-02-24 NOTE — Discharge Instructions (Signed)
YOU HAVE A MASS IN YOUR LEFT COLON. YOU HAVE HEMORRHOIDS.   YOUR BIOPSY WILL BE BACK IN 3 DAYS.  YOU NEED A CT CHEST WITHIN 7 DAYS.  WE HAVE DRAWN YOUR LABS TODAY.  YOU NEED TO SEE SURGERY WITHIN THE NEXT 1-2 WEEKS.  YOU NEED A COLONOSCOPY IN 1 YEAR.  YOUR SISTERS, BROTHERS, CHILDREN, AND PARENTS NEED TO HAVE A COLONOSCOPY STARTING AT THE AGE OF 32 and then every 5 years.  PLEASE CALL ME AT [phone] WITH QUESTIONS OR CONCERNS.    ENDOSCOPY Care After Read the instructions outlined below and refer to this sheet in the next week. These discharge instructions provide you with general information on caring for yourself after you leave the hospital. While your treatment has been planned according to the most current medical practices available, unavoidable complications occasionally occur. If you have any problems or questions after discharge, call DR. Cleveland Paiz, [phone].  ACTIVITY  You may resume your regular activity, but move at a slower pace for the next 24 hours.   Take frequent rest periods for the next 24 hours.   Walking will help get rid of the air and reduce the bloated feeling in your belly (abdomen).   No driving for 24 hours (because of the medicine (anesthesia) used during the test).   You may shower.   Do not sign any important legal documents or operate any machinery for 24 hours (because of the anesthesia used during the test).    NUTRITION  Drink plenty of fluids.   You may resume your normal diet as instructed by your doctor.   Begin with a light meal and progress to your normal diet. Heavy or fried foods are harder to digest and may make you feel sick to your stomach (nauseated).   Avoid alcoholic beverages for 24 hours or as instructed.    MEDICATIONS  You may resume your normal medications.   WHAT YOU CAN EXPECT TODAY  Some feelings of bloating in the abdomen.   Passage of more gas than usual.   Spotting of blood in your stool or on  the toilet paper     IF YOU HADBIOPSIES TAKEN  DURING THE SIGMOIDOSCOPY/UPPER ENDOSCOPY:  Eat a soft diet IF YOU HAVE NAUSEA, BLOATING, ABDOMINAL PAIN, OR VOMITING.    FINDING OUT THE RESULTS OF YOUR TEST Not all test results are available during your visit. DR. Oneida Alar WILL CALL YOU WITHIN 7 DAYS OF YOUR PROCEDUE WITH YOUR RESULTS. Do not assume everything is normal if you have not heard from DR. Mirakle Tomlin IN ONE WEEK, CALL HER OFFICE AT [phone].  SEEK IMMEDIATE MEDICAL ATTENTION AND CALL THE OFFICE: [phone] IF:  You have more than a spotting of blood in your stool.   Your belly is swollen (abdominal distention).   You are nauseated or vomiting.   You have a temperature over 101F.   You have abdominal pain or discomfort that is severe or gets worse throughout the day.  High-Fiber Diet A high-fiber diet changes your normal diet to include more whole grains, legumes, fruits, and vegetables. Changes in the diet involve replacing refined carbohydrates with unrefined foods. The calorie level of the diet is essentially unchanged. The Dietary Reference Intake (recommended amount) for adult males is 38 grams per day. For adult females, it is 25 grams per day. Pregnant and lactating women should consume 28 grams of fiber per day. Fiber is the intact part of a plant that is not broken down during digestion. Functional fiber  is fiber that has been isolated from the plant to provide a beneficial effect in the body.  PURPOSE  Increase stool bulk.   Ease and regulate bowel movements.   Lower cholesterol.   REDUCE RISK OF COLON CANCER  INDICATIONS THAT YOU NEED MORE FIBER  Constipation and hemorrhoids.   Uncomplicated diverticulosis (intestine condition) and irritable bowel syndrome.   Weight management.   As a protective measure against hardening of the arteries (atherosclerosis), diabetes, and cancer.   DO NOT USE WITH:  Acute diverticulitis (intestine infection).    Partial small bowel obstructions.   Complicated diverticular disease involving bleeding, rupture (perforation), or abscess (boil, furuncle).   Presence of autonomic neuropathy (nerve damage) or gastroparesis (stomach cannot empty itself).   GUIDELINES FOR INCREASING FIBER IN THE DIET  Start adding fiber to the diet slowly. A gradual increase of about 5 more grams (2 slices of whole-wheat bread, 2 servings of most fruits or vegetables, or 1 bowl of high-fiber cereal) per day is best. Too rapid an increase in fiber may result in constipation, flatulence, and bloating.   Drink enough water and fluids to keep your urine clear or pale yellow. Water, juice, or caffeine-free drinks are recommended. Not drinking enough fluid may cause constipation.   Eat a variety of high-fiber foods rather than one type of fiber.   Try to increase your intake of fiber through using high-fiber foods rather than fiber pills or supplements that contain small amounts of fiber.   The goal is to change the types of food eaten. Do not supplement your present diet with high-fiber foods, but replace foods in your present diet.    INCLUDE A VARIETY OF FIBER SOURCES  Replace refined and processed grains with whole grains, canned fruits with fresh fruits, and incorporate other fiber sources. White rice, white breads, and most bakery goods contain little or no fiber.   Brown whole-grain rice, buckwheat oats, and many fruits and vegetables are all good sources of fiber. These include: broccoli, Brussels sprouts, cabbage, cauliflower, beets, sweet potatoes, white potatoes (skin on), carrots, tomatoes, eggplant, squash, berries, fresh fruits, and dried fruits.   Cereals appear to be the richest source of fiber. Cereal fiber is found in whole grains and bran. Bran is the fiber-rich outer coat of cereal grain, which is largely removed in refining. In whole-grain cereals, the bran remains. In breakfast cereals, the largest amount  of fiber is found in those with "bran" in their names. The fiber content is sometimes indicated on the label.   You may need to include additional fruits and vegetables each day.   In baking, for 1 cup white flour, you may use the following substitutions:   1 cup whole-wheat flour minus 2 tablespoons.   1/2 cup white flour plus 1/2 cup whole-wheat flour.

## 2018-02-24 NOTE — Patient Instructions (Signed)
Open Colectomy An open colectomy is surgery to remove part or all of the large intestine (colon). This procedure may be used to treat several conditions, including:  Inflammation and infection of the colon (diverticulitis).  Tumors or masses in the colon.  Inflammatory bowel disease, such as Crohn disease or ulcerative colitis.  Bleeding from the colon.  Blockage or obstruction of the colon.  Tell a health care provider about:  Any allergies you have.  All medicines you are taking, including vitamins, herbs, eye drops, creams, and over-the-counter medicines.  Any problems you or family members have had with anesthetic medicines.  Any blood disorders you have.  Any surgeries you have had.  Any medical conditions you have.  Whether you are pregnant or may be pregnant.  Whether you smoke or use tobacco products. These can affect your body's reaction to anesthesia. What are the risks? Generally, this is a safe procedure. However, problems may occur, including:  Infection.  Bleeding.  Allergic reactions to medicines.  Damage to other structures or organs.  Pneumonia.  The incision opening up.  Tissues from inside the abdomen bulging through the incision (hernia).  Reopening of the colon where it was stitched or stapled together.  A blood clot forming in a vein and traveling to the lungs.  Future blockage of the small intestine from scar tissue.   Bowel prep In some cases, you may be prescribed an oral bowel prep to clean out your colon. If so:  Take it as told by your health care provider. Starting the day before your procedure, you may need to drink a large amount of medicated liquid. The liquid will cause you to have multiple loose stools until your stool is almost clear or light green.  Follow instructions from your health care provider about eating and drinking restrictions during bowel prep.  Medicines  Ask your health care provider about: ? Changing or  stopping your regular medicines or vitamins. This is especially important if you are taking diabetes medicines, blood thinners, or vitamin E. ? Taking medicines such as aspirin and ibuprofen. These medicines can thin your blood. Do not take these medicines before your procedure if your health care provider instructs you not to.  If you were prescribed an antibiotic medicine, take it as told by your health care provider. General instructions  Bring loose-fitting, comfortable clothing and slip-on shoes that you can put on without bending over.  Make sure to see your health care provider for any tests that you need before the procedure, such as: ? Blood tests. ? A test to check the heart's rhythm (electrocardiogram, ECG). ? A CT scan of your abdomen. ? Urine tests. ? Colonoscopy.  Plan to have someone take you home from the hospital or clinic.  Arrange for someone to help you with your activities during your recovery. What happens during the procedure?  To reduce your risk of infection: ? Your health care team will wash or sanitize their hands. ? Your skin will be washed with soap. ? Hair may be removed from the surgical area.  An IV tube will be inserted into one of your veins. The tube will be used to give you medicines and fluids.  You will be given a medicine to make you fall asleep (general anesthetic). You may also be given a medicine to help you relax (sedative).  Small monitors will be connected to your body. They will be used to check your heart, blood pressure, and oxygen level.  A breathing  tube may be placed into your lungs during the procedure.  A thin, flexible tube (catheter) will be placed into your bladder to drain urine.  A tube may be inserted through your nose and into your stomach (nasogastric tube, or NG tube). The tube is used to remove stomach fluids after surgery until the intestines start working again.  An incision will be made in your abdomen.  Clamps  or staples will be put on your colon.  The part of the colon between the clamps or staples will be removed.  The ends of the colon that remain will be stitched or stapled together.  The incision in your abdomen will be closed with stitches (sutures) or staples.  The incision will be covered with a bandage (dressing).  A small opening (stoma) may be created in your lower abdomen. A removable, external pouch (ostomy pouch) will be attached to the stoma. This pouch will collect stool outside of your body. Stool passes through the stoma and into the pouch instead of through your anus. The procedure may vary among health care providers and hospitals. What happens after the procedure?  Your blood pressure, heart rate, breathing rate, and blood oxygen level will be monitored until the medicines you were given have worn off.  You may continue to receive fluids and medicines through an IV tube.  You will start on a clear liquid diet and gradually go back to a normal diet.  Do not drive until your health care provider approves.  You may have some pain in your abdomen. You will be given pain medicine to control the pain.  You will be encouraged to do the following: ? Do breathing exercises to prevent pneumonia. ? Get up and start walking within a day after surgery. You should try to get up 5-6 times a day. This information is not intended to replace advice given to you by your health care provider. Make sure you discuss any questions you have with your health care provider. Document Released: 01/20/2009 Document Revised: 12/25/2015 Document Reviewed: 12/25/2015 Elsevier Interactive Patient Education  [employer].

## 2018-02-24 NOTE — Telephone Encounter (Signed)
SPOKE TO DR. Arnoldo Morale AND HE CAN SEE PT TODAY @130P . SPOKE TO MRS. DISHMON AND SHE WILL BRING PT TO APPT. SHE HAS TO PICK UP HER KIDS AT 230P.

## 2018-02-24 NOTE — Telephone Encounter (Signed)
CT is scheduled for 02/26/18 at 8:30am, arrival time 8:15am, npo 4 hours prior.  Called made patient aware of appt details. He voiced understanding.

## 2018-02-24 NOTE — Telephone Encounter (Signed)
Received called from [place] in Endo. Patient needs CT chest within 7 days. Patient had mass in left colon.

## 2018-02-24 NOTE — Anesthesia Preprocedure Evaluation (Signed)
Anesthesia Evaluation  Patient identified by MRN, date of birth, ID band Patient awake    Reviewed: Allergy & Precautions, NPO status , Patient's Chart, lab work & pertinent test results  Airway Mallampati: I  TM Distance: >3 FB Neck ROM: Full    Dental no notable dental hx. (+) Poor Dentition, Missing   Pulmonary neg pulmonary ROS, former smoker,    Pulmonary exam normal breath sounds clear to auscultation       Cardiovascular Exercise Tolerance: Good hypertension, Pt. on medications negative cardio ROS Normal cardiovascular examI Rhythm:Regular Rate:Normal     Neuro/Psych negative neurological ROS  negative psych ROS   GI/Hepatic Neg liver ROS, GERD  Medicated and Controlled,LUQ pain, constipation, rectal bleeding , and abnl CT here for w/u.    Endo/Other  negative endocrine ROS  Renal/GU negative Renal ROS  negative genitourinary   Musculoskeletal negative musculoskeletal ROS (+)   Abdominal   Peds negative pediatric ROS (+)  Hematology negative hematology ROS (+)   Anesthesia Other Findings   Reproductive/Obstetrics negative OB ROS                             Anesthesia Physical Anesthesia Plan  ASA: II  Anesthesia Plan: General   Post-op Pain Management:    Induction: Intravenous  PONV Risk Score and Plan:   Airway Management Planned: Nasal Cannula and Simple Face Mask  Additional Equipment:   Intra-op Plan:   Post-operative Plan: Extubation in OR  Informed Consent: I have reviewed the patients History and Physical, chart, labs and discussed the procedure including the risks, benefits and alternatives for the proposed anesthesia with the patient or authorized representative who has indicated his/her understanding and acceptance.   Dental advisory given  Plan Discussed with: CRNA  Anesthesia Plan Comments:         Anesthesia Quick Evaluation

## 2018-02-24 NOTE — H&P (Signed)
Primary Care Physician:  Sharilyn Sites, MD Primary Gastroenterologist:  Dr. Oneida Alar  Pre-Procedure History & Physical: HPI:  Joel Jefferson. is a 42 y.o. male here for ABDOMINAL PAIN/abnl CT  Past Medical History:  Diagnosis Date  . GERD (gastroesophageal reflux disease)   . History of kidney stones   . Hypercholesterolemia   . Hypertension   . Urethral stricture     Past Surgical History:  Procedure Laterality Date  . BALLOON DILATION  04/06/2012   Procedure: BALLOON DILATION;  Surgeon: Reece Packer, MD;  Location: [facility];  Service: Urology;  Laterality: N/A;  . ESOPHAGOGASTRODUODENOSCOPY (EGD) WITH PROPOFOL N/A 08/29/2015   Dr. Oneida Alar: possible proximal esophageal web s/p dilation, moderate gastritis, negative eosinophilic esophagitis   . SAVORY DILATION N/A 08/29/2015   Procedure: SAVORY DILATION;  Surgeon: Danie Binder, MD;  Location: [facility];  Service: Endoscopy;  Laterality: N/A;  . URETHRAL DILATION    . WISDOM TOOTH EXTRACTION      Prior to Admission medications   Medication Sig Start Date End Date Taking? Authorizing Provider  linaclotide [facility]) 145 MCG CAPS capsule Take 1 capsule (145 mcg total) by mouth daily before breakfast. 02/17/18  Yes Mahala Menghini, PA-C  lisinopril (PRINIVIL,ZESTRIL) 10 MG tablet Take 10 mg by mouth daily.  09/21/14  Yes [provider]  Magnesium 500 MG CAPS Take 500 mg by mouth daily.    Yes [provider]  omeprazole (PRILOSEC) 20 MG capsule Take 20 mg by mouth 2 (two) times daily.   Yes [provider]  polyethylene glycol-electrolytes (TRILYTE) 420 g solution Take 4,000 mLs by mouth as directed. 02/17/18  Yes Samit Sylve L, MD  Potassium (POTASSIMIN PO) Take 1 tablet by mouth daily.    Yes [provider]  pravastatin (PRAVACHOL) 20 MG tablet Take 20 mg by mouth daily.  08/30/14  Yes [provider]  topiramate (TOPAMAX) 50 MG tablet Take 100 mg by mouth  2 (two) times daily.    Yes [provider]    Allergies as of 02/17/2018  . (No Known Allergies)    Family History  Problem Relation Age of Onset  . Colon cancer Neg Hx     Social History   Socioeconomic History  . Marital status: Divorced    Spouse name: Not on file  . Number of children: Not on file  . Years of education: Not on file  . Highest education level: Not on file  Occupational History  . Not on file  Social Needs  . Financial resource strain: Not on file  . Food insecurity:    Worry: Not on file    Inability: Not on file  . Transportation needs:    Medical: Not on file    Non-medical: Not on file  Tobacco Use  . Smoking status: Former Smoker    Packs/day: 0.25    Years: 9.00    Pack years: 2.25    Types: Cigarettes    Last attempt to quit: 07/27/2011    Years since quitting: 6.5  . Smokeless tobacco: Never Used  Substance and Sexual Activity  . Alcohol use: Yes    Alcohol/week: 6.0 standard drinks    Types: 6 Cans of beer per week  . Drug use: No  . Sexual activity: Yes    Birth control/protection: None  Lifestyle  . Physical activity:    Days per week: Not on file    Minutes per session: Not on  file  . Stress: Not on file  Relationships  . Social connections:    Talks on phone: Not on file    Gets together: Not on file    Attends religious service: Not on file    Active member of club or organization: Not on file    Attends meetings of clubs or organizations: Not on file    Relationship status: Not on file  . Intimate partner violence:    Fear of current or ex partner: Not on file    Emotionally abused: Not on file    Physically abused: Not on file    Forced sexual activity: Not on file  Other Topics Concern  . Not on file  Social History Narrative  . Not on file    Review of Systems: See HPI, otherwise negative ROS   Physical Exam: BP (!) 131/91   Pulse 74   Temp 98.5 F (36.9 C) (Oral)   Resp 18   Ht 5\' 10"  (1.778  m)   Wt 99.7 kg   SpO2 100%   BMI 31.54 kg/m  General:   Alert,  pleasant and cooperative in NAD Head:  Normocephalic and atraumatic. Neck:  Supple; Lungs:  Clear throughout to auscultation.    Heart:  Regular rate and rhythm. Abdomen:  Soft, nontender and nondistended. Normal bowel sounds, without guarding, and without rebound.   Neurologic:  Alert and  oriented x4;  grossly normal neurologically.  Impression/Plan:     ABDOMINAL PAIN/abnl CT  PLAN:  TCS TODAY. Marland KitchenDISCUSSED PROCEDURE, BENEFITS, & RISKS: < 1% chance of medication reaction, bleeding, perforation, or rupture of spleen/liver.

## 2018-02-24 NOTE — Addendum Note (Signed)
Addended by: Inge Rise on: 02/24/2018 11:59 AM   Modules accepted: Orders

## 2018-02-24 NOTE — Telephone Encounter (Signed)
Referral sent to Dr. Jenkins 

## 2018-02-24 NOTE — Op Note (Addendum)
[facility] Patient Name: Joel Jefferson Procedure Date: 02/24/2018 9:18 AM MRN: [id number] Date of Birth: January 20, 1976 Attending MD: Barney Drain MD, MD CSN: [id number] Age: 42 Admit Type: Outpatient Procedure:                Colonoscopy with COLD FORCEPS BIOPSY/SPOT TATTOO Indications:              Hematochezia Providers:                Barney Drain MD, MD, Otis Peak B. Sharon Seller, RN,                            Randa Spike, Technician Referring MD:             Halford Chessman MD, MD Medicines:                Propofol per Anesthesia Complications:            No immediate complications. Estimated Blood Loss:     Estimated blood loss was minimal. Procedure:                Pre-Anesthesia Assessment:                           - Prior to the procedure, a History and Physical                            was performed, and patient medications and                            allergies were reviewed. The patient's tolerance of                            previous anesthesia was also reviewed. The risks                            and benefits of the procedure and the sedation                            options and risks were discussed with the patient.                            All questions were answered, and informed consent                            was obtained. Prior Anticoagulants: The patient has                            taken no previous anticoagulant or antiplatelet                            agents. ASA Grade Assessment: II - A patient with                            mild systemic disease. After reviewing the risks  and benefits, the patient was deemed in                            satisfactory condition to undergo the procedure.                            After obtaining informed consent, the colonoscope                            was passed under direct vision. Throughout the                            procedure, the patient's blood pressure, pulse,  and                            oxygen saturations were monitored continuously. The                            CF-[id number] ([id number]) scope was introduced through                            the anus and advanced to the 7 cm into the ileum.                            The colonoscopy was performed without difficulty.                            The patient tolerated the procedure well. The                            quality of the bowel preparation was excellent. Scope In: 9:32:14 AM Scope Out: 9:55:05 AM Scope Withdrawal Time: 0 hours 20 minutes 41 seconds  Total Procedure Duration: 0 hours 22 minutes 51 seconds  Findings:      The terminal ileum appeared normal.      A fungating, infiltrative and polypoid partially obstructing large mass       was found in the sigmoid colon BETWEEN 20-25 CM FROM THE ANAL VERGE. The       mass was circumferential. The mass measured five cm in length. Oozing       was present. Biopsies were taken with a cold forceps for histology. Area       was tattooed with an injection of 1 mL of Spot (carbon black) PROXIMAL       AND DISTAL TO SIGMOID COLON MASS.      External and internal hemorrhoids were found. The hemorrhoids were small.      The recto-sigmoid colon and sigmoid colon were mildly redundant. Impression:               - The examined portion of the ileum was normal.                           - RECTAL; BLEEDING/ABNORMAL CT DUE TO PARTIALLY                            obstructing  tumor in the sigmoid colon. Biopsied.                            Tattooed.                           - External and internal hemorrhoids.                           - Redundant LEFT colon. Moderate Sedation:      Per Anesthesia Care Recommendation:           - Patient has a contact number available for                            emergencies. The signs and symptoms of potential                            delayed complications were discussed with the                            patient.  Return to normal activities tomorrow.                            Written discharge instructions were provided to the                            patient.                           - High fiber diet.                           - Continue present medications.                           - Await pathology results. ALL FIRST DEGREE                            RELATIVES NEED TCS AT AGE 60 AND EVERY 5 YEARS.                           - Repeat colonoscopy in 1 year for surveillance.                           - Refer to a surgeon at the next available                            appointment. Procedure Code(s):        --- Professional ---                           [phone], Colonoscopy, flexible; with directed                            submucosal injection(s), any substance  45380, Colonoscopy, flexible; with biopsy, single                            or multiple Diagnosis Code(s):        --- Professional ---                           K64.8, Other hemorrhoids                           D49.0, Neoplasm of unspecified behavior of                            digestive system                           K56.690, Other partial intestinal obstruction                           K92.1, Melena (includes Hematochezia)                           Q43.8, Other specified congenital malformations of                            intestine CPT copyright 2018 American Medical Association. All rights reserved. The codes documented in this report are preliminary and upon coder review may  be revised to meet current compliance requirements. Barney Drain, MD Barney Drain MD, MD 02/24/2018 10:16:58 AM This report has been signed electronically. Number of Addenda: 0

## 2018-02-25 ENCOUNTER — Encounter (HOSPITAL_COMMUNITY): Payer: Self-pay

## 2018-02-25 ENCOUNTER — Encounter (HOSPITAL_COMMUNITY)
Admission: RE | Admit: 2018-02-25 | Discharge: 2018-02-25 | Disposition: A | Discharge status: Home / Self Care | Payer: BLUE CROSS/BLUE SHIELD | Source: Ambulatory Visit | Attending: General Surgery | Admitting: General Surgery

## 2018-02-25 LAB — CEA: CEA1: 1.7 ng/mL (ref 0.0–4.7)

## 2018-02-25 MED ORDER — PEG 3350-KCL-NABCB-NACL-NASULF 236 G PO SOLR: 4000.0000 mL | Freq: Once | ORAL | 0 refills | Status: AC

## 2018-02-25 MED ORDER — METRONIDAZOLE 500 MG PO TABS: 500.0000 mg | ORAL_TABLET | Freq: Three times a day (TID) | ORAL | 0 refills | Status: DC

## 2018-02-25 MED ORDER — NEOMYCIN SULFATE 500 MG PO TABS: 1000.0000 mg | ORAL_TABLET | Freq: Three times a day (TID) | ORAL | 0 refills | Status: DC

## 2018-02-25 NOTE — Progress Notes (Signed)
Joel Gram.; [id number]; [birth year]   HPI Patient is a 42 year old white male who was referred to my care by Dr. Oneida Alar and Dr. Hilma Favors for evaluation treatment of the sigmoid colon mass.  He was found earlier today on colonoscopy.  Patient underwent colonoscopy for blood per rectum.  He denies any family history of colon cancer.  He states he has had smaller more frequent bowel movements recently.  He denies any weight loss.  A CT scan of the abdomen done prior to the colonoscopy did reveal thickening of the wall of the sigmoid colon.  The colonoscopy revealed a near obstructing colon mass approximately 20 to 25 cm from the anus.  Biopsies are pending.  He currently has a pain level of 2 out of 10. Past Medical History:  Diagnosis Date  . GERD (gastroesophageal reflux disease)   . History of kidney stones   . Hypercholesterolemia   . Hypertension   . Urethral stricture     Past Surgical History:  Procedure Laterality Date  . BALLOON DILATION  04/06/2012   Procedure: BALLOON DILATION;  Surgeon: Reece Packer, MD;  Location: [facility];  Service: Urology;  Laterality: N/A;  . ESOPHAGOGASTRODUODENOSCOPY (EGD) WITH PROPOFOL N/A 08/29/2015   Dr. Oneida Alar: possible proximal esophageal web s/p dilation, moderate gastritis, negative eosinophilic esophagitis   . SAVORY DILATION N/A 08/29/2015   Procedure: SAVORY DILATION;  Surgeon: Danie Binder, MD;  Location: [facility];  Service: Endoscopy;  Laterality: N/A;  . URETHRAL DILATION    . WISDOM TOOTH EXTRACTION      Family History  Problem Relation Age of Onset  . Colon cancer Neg Hx     Current Outpatient Medications on File Prior to Visit  Medication Sig Dispense Refill  . linaclotide (LINZESS) 145 MCG CAPS capsule Take 1 capsule (145 mcg total) by mouth daily before breakfast. 30 capsule 3  . lisinopril (PRINIVIL,ZESTRIL) 10 MG tablet Take 10 mg by mouth daily.   5  . Magnesium 500 MG CAPS Take 500 mg by mouth  daily.     Marland Kitchen omeprazole (PRILOSEC) 20 MG capsule Take 20 mg by mouth 2 (two) times daily.    . Potassium (POTASSIMIN PO) Take 1 tablet by mouth daily.     . pravastatin (PRAVACHOL) 20 MG tablet Take 20 mg by mouth daily.   1  . topiramate (TOPAMAX) 50 MG tablet Take 100 mg by mouth 2 (two) times daily.      No current facility-administered medications on file prior to visit.     No Known Allergies  Social History   Substance and Sexual Activity  Alcohol Use Yes  . Alcohol/week: 6.0 standard drinks  . Types: 6 Cans of beer per week    Social History   Tobacco Use  Smoking Status Former Smoker  . Packs/day: 0.25  . Years: 9.00  . Pack years: 2.25  . Types: Cigarettes  . Last attempt to quit: 07/27/2011  . Years since quitting: 6.5  Smokeless Tobacco Never Used    Review of Systems  Constitutional: Negative.   HENT: Negative.   Eyes: Negative.   Respiratory: Negative.   Cardiovascular: Negative.   Gastrointestinal: Positive for abdominal pain.  Genitourinary: Negative.   Musculoskeletal: Negative.   Skin: Negative.   Neurological: Negative.   Endo/Heme/Allergies: Negative.   Psychiatric/Behavioral: Negative.     Objective   Vitals:   02/24/18 1334  BP: 140/86  Pulse: 89  Resp: 18  Temp: 98 F (  36.7 C)    Physical Exam  Constitutional: He is oriented to person, place, and time. He appears well-developed and well-nourished. No distress.  HENT:  Head: Normocephalic and atraumatic.  Cardiovascular: Normal rate, regular rhythm and normal heart sounds. Exam reveals no gallop and no friction rub.  No murmur heard. Pulmonary/Chest: Effort normal and breath sounds normal. No stridor. No respiratory distress. He has no wheezes. He has no rales.  Abdominal: Soft. Bowel sounds are normal. He exhibits no distension and no mass. There is no tenderness. There is no rebound and no guarding.  Neurological: He is alert and oriented to person, place, and time.  Skin: Skin  is warm and dry.  Vitals reviewed.  Colonoscopy and CT reports reviewed Assessment  Sigmoid colon neoplasm, suspicious for malignancy Plan   Patient is scheduled for partial colectomy on 03/02/2018.  The risks and benefits of the procedure including bleeding, infection, anastomotic leak, the possibility of a blood treatments fusion, and the possibility of malignancy were fully explained to the patient, who gave informed consent.  GoLYTELY, neomycin, and Flagyl have been ordered for preoperative bowel prep.

## 2018-02-25 NOTE — Telephone Encounter (Signed)
CALLED BY PATHOLOGY. PT HAS SIGMOID COLON CARCINOMA.

## 2018-02-25 NOTE — Telephone Encounter (Signed)
PA for CT Chest approved via [employer] website. RPZP#[id number]. Dates 02/25/18-03/26/18

## 2018-02-25 NOTE — H&P (Signed)
Joel Jefferson.; [id number]; Sep 27, 1975   HPI Patient is a 42 year old white male who was referred to my care by Dr. Oneida Alar and Dr. Hilma Favors for evaluation treatment of the sigmoid colon mass.  He was found earlier today on colonoscopy.  Patient underwent colonoscopy for blood per rectum.  He denies any family history of colon cancer.  He states he has had smaller more frequent bowel movements recently.  He denies any weight loss.  A CT scan of the abdomen done prior to the colonoscopy did reveal thickening of the wall of the sigmoid colon.  The colonoscopy revealed a near obstructing colon mass approximately 20 to 25 cm from the anus.  Biopsies are pending.  He currently has a pain level of 2 out of 10. Past Medical History:  Diagnosis Date  . GERD (gastroesophageal reflux disease)   . History of kidney stones   . Hypercholesterolemia   . Hypertension   . Urethral stricture     Past Surgical History:  Procedure Laterality Date  . BALLOON DILATION  04/06/2012   Procedure: BALLOON DILATION;  Surgeon: Reece Packer, MD;  Location: [facility];  Service: Urology;  Laterality: N/A;  . ESOPHAGOGASTRODUODENOSCOPY (EGD) WITH PROPOFOL N/A 08/29/2015   Dr. Oneida Alar: possible proximal esophageal web s/p dilation, moderate gastritis, negative eosinophilic esophagitis   . SAVORY DILATION N/A 08/29/2015   Procedure: SAVORY DILATION;  Surgeon: Danie Binder, MD;  Location: [facility];  Service: Endoscopy;  Laterality: N/A;  . URETHRAL DILATION    . WISDOM TOOTH EXTRACTION      Family History  Problem Relation Age of Onset  . Colon cancer Neg Hx     Current Outpatient Medications on File Prior to Visit  Medication Sig Dispense Refill  . linaclotide (LINZESS) 145 MCG CAPS capsule Take 1 capsule (145 mcg total) by mouth daily before breakfast. 30 capsule 3  . lisinopril (PRINIVIL,ZESTRIL) 10 MG tablet Take 10 mg by mouth daily.   5  . Magnesium 500 MG CAPS Take 500 mg by mouth  daily.     Marland Kitchen omeprazole (PRILOSEC) 20 MG capsule Take 20 mg by mouth 2 (two) times daily.    . Potassium (POTASSIMIN PO) Take 1 tablet by mouth daily.     . pravastatin (PRAVACHOL) 20 MG tablet Take 20 mg by mouth daily.   1  . topiramate (TOPAMAX) 50 MG tablet Take 100 mg by mouth 2 (two) times daily.      No current facility-administered medications on file prior to visit.     No Known Allergies  Social History   Substance and Sexual Activity  Alcohol Use Yes  . Alcohol/week: 6.0 standard drinks  . Types: 6 Cans of beer per week    Social History   Tobacco Use  Smoking Status Former Smoker  . Packs/day: 0.25  . Years: 9.00  . Pack years: 2.25  . Types: Cigarettes  . Last attempt to quit: 07/27/2011  . Years since quitting: 6.5  Smokeless Tobacco Never Used    Review of Systems  Constitutional: Negative.   HENT: Negative.   Eyes: Negative.   Respiratory: Negative.   Cardiovascular: Negative.   Gastrointestinal: Positive for abdominal pain.  Genitourinary: Negative.   Musculoskeletal: Negative.   Skin: Negative.   Neurological: Negative.   Endo/Heme/Allergies: Negative.   Psychiatric/Behavioral: Negative.     Objective   Vitals:   02/24/18 1334  BP: 140/86  Pulse: 89  Resp: 18  Temp: 98 F (  36.7 C)    Physical Exam  Constitutional: He is oriented to person, place, and time. He appears well-developed and well-nourished. No distress.  HENT:  Head: Normocephalic and atraumatic.  Cardiovascular: Normal rate, regular rhythm and normal heart sounds. Exam reveals no gallop and no friction rub.  No murmur heard. Pulmonary/Chest: Effort normal and breath sounds normal. No stridor. No respiratory distress. He has no wheezes. He has no rales.  Abdominal: Soft. Bowel sounds are normal. He exhibits no distension and no mass. There is no tenderness. There is no rebound and no guarding.  Neurological: He is alert and oriented to person, place, and time.  Skin: Skin  is warm and dry.  Vitals reviewed.  Colonoscopy and CT reports reviewed Assessment  Sigmoid colon neoplasm, suspicious for malignancy Plan   Patient is scheduled for partial colectomy on 03/02/2018.  The risks and benefits of the procedure including bleeding, infection, anastomotic leak, the possibility of a blood treatments fusion, and the possibility of malignancy were fully explained to the patient, who gave informed consent.  GoLYTELY, neomycin, and Flagyl have been ordered for preoperative bowel prep.

## 2018-02-25 NOTE — Telephone Encounter (Addendum)
Called patient TO DISCUSS RESULTS. AWARE HE HAS COLON CANCER. ALSO AWARE OF LAB RESULTS.

## 2018-02-26 ENCOUNTER — Ambulatory Visit (HOSPITAL_COMMUNITY)
Admission: RE | Admit: 2018-02-26 | Discharge: 2018-02-26 | Disposition: A | Discharge status: Home / Self Care | Payer: BLUE CROSS/BLUE SHIELD | Source: Ambulatory Visit | Attending: Gastroenterology | Admitting: Gastroenterology

## 2018-02-26 ENCOUNTER — Other Ambulatory Visit (HOSPITAL_COMMUNITY): Payer: Self-pay

## 2018-02-26 DIAGNOSIS — R918 Other nonspecific abnormal finding of lung field: Secondary | ICD-10-CM | POA: Insufficient documentation

## 2018-02-26 DIAGNOSIS — R933 Abnormal findings on diagnostic imaging of other parts of digestive tract: Secondary | ICD-10-CM | POA: Diagnosis not present

## 2018-02-26 DIAGNOSIS — K760 Fatty (change of) liver, not elsewhere classified: Secondary | ICD-10-CM | POA: Diagnosis not present

## 2018-02-26 MED ORDER — IOPAMIDOL (ISOVUE-300) INJECTION 61%: 80.0000 mL | Freq: Once | INTRAVENOUS | Status: DC | PRN

## 2018-02-26 MED ORDER — IOHEXOL 300 MG/ML  SOLN
75.0000 mL | Freq: Once | INTRAMUSCULAR | Status: AC | PRN
  Administered 2018-02-26: 75 mL via INTRAVENOUS

## 2018-02-26 NOTE — Progress Notes (Signed)
CC'D TO PCP °

## 2018-02-26 NOTE — Progress Notes (Signed)
PT is aware.

## 2018-02-27 ENCOUNTER — Encounter (HOSPITAL_COMMUNITY): Payer: Self-pay | Admitting: Gastroenterology

## 2018-03-02 ENCOUNTER — Inpatient Hospital Stay (HOSPITAL_COMMUNITY)
Admission: RE | Admit: 2018-03-02 | Discharge: 2018-03-05 | DRG: 330 | Disposition: A | Discharge status: Home / Self Care | Payer: BLUE CROSS/BLUE SHIELD | Attending: General Surgery | Admitting: General Surgery

## 2018-03-02 ENCOUNTER — Inpatient Hospital Stay (HOSPITAL_COMMUNITY): Payer: BLUE CROSS/BLUE SHIELD | Admitting: Anesthesiology

## 2018-03-02 ENCOUNTER — Encounter (HOSPITAL_COMMUNITY)
Admission: RE | Disposition: A | Discharge status: Home / Self Care | Payer: Self-pay | Source: Home / Self Care | Attending: General Surgery

## 2018-03-02 ENCOUNTER — Other Ambulatory Visit: Payer: Self-pay

## 2018-03-02 ENCOUNTER — Encounter (HOSPITAL_COMMUNITY): Payer: Self-pay | Admitting: Anesthesiology

## 2018-03-02 DIAGNOSIS — N200 Calculus of kidney: Secondary | ICD-10-CM | POA: Diagnosis not present

## 2018-03-02 DIAGNOSIS — K219 Gastro-esophageal reflux disease without esophagitis: Secondary | ICD-10-CM | POA: Diagnosis present

## 2018-03-02 DIAGNOSIS — N202 Calculus of kidney with calculus of ureter: Secondary | ICD-10-CM | POA: Diagnosis not present

## 2018-03-02 DIAGNOSIS — Z9049 Acquired absence of other specified parts of digestive tract: Secondary | ICD-10-CM

## 2018-03-02 DIAGNOSIS — Z23 Encounter for immunization: Secondary | ICD-10-CM | POA: Diagnosis not present

## 2018-03-02 DIAGNOSIS — C772 Secondary and unspecified malignant neoplasm of intra-abdominal lymph nodes: Secondary | ICD-10-CM | POA: Diagnosis not present

## 2018-03-02 DIAGNOSIS — E7800 Pure hypercholesterolemia, unspecified: Secondary | ICD-10-CM | POA: Diagnosis not present

## 2018-03-02 DIAGNOSIS — C187 Malignant neoplasm of sigmoid colon: Secondary | ICD-10-CM | POA: Diagnosis not present

## 2018-03-02 DIAGNOSIS — N179 Acute kidney failure, unspecified: Secondary | ICD-10-CM | POA: Diagnosis not present

## 2018-03-02 DIAGNOSIS — Z79899 Other long term (current) drug therapy: Secondary | ICD-10-CM

## 2018-03-02 DIAGNOSIS — N132 Hydronephrosis with renal and ureteral calculous obstruction: Secondary | ICD-10-CM | POA: Diagnosis not present

## 2018-03-02 DIAGNOSIS — Z87442 Personal history of urinary calculi: Secondary | ICD-10-CM | POA: Diagnosis not present

## 2018-03-02 DIAGNOSIS — Z87891 Personal history of nicotine dependence: Secondary | ICD-10-CM | POA: Diagnosis not present

## 2018-03-02 DIAGNOSIS — I10 Essential (primary) hypertension: Secondary | ICD-10-CM | POA: Diagnosis not present

## 2018-03-02 DIAGNOSIS — R109 Unspecified abdominal pain: Secondary | ICD-10-CM

## 2018-03-02 DIAGNOSIS — D490 Neoplasm of unspecified behavior of digestive system: Secondary | ICD-10-CM | POA: Diagnosis not present

## 2018-03-02 HISTORY — PX: PARTIAL COLECTOMY: SHX5273

## 2018-03-02 LAB — BASIC METABOLIC PANEL
Anion gap: 10 (ref 5–15)
BUN: 16 mg/dL (ref 6–20)
CHLORIDE: 109 mmol/L (ref 98–111)
CO2: 19 mmol/L — AB (ref 22–32)
Calcium: 8.9 mg/dL (ref 8.9–10.3)
Creatinine, Ser: 1.24 mg/dL (ref 0.61–1.24)
GFR calc Af Amer: >60 mL/min (ref >60)
GFR calc non Af Amer: >60 mL/min (ref >60)
Glucose, Bld: 202 mg/dL — ABNORMAL HIGH (ref 70–99)
POTASSIUM: 3.2 mmol/L — AB (ref 3.5–5.1)
SODIUM: 138 mmol/L (ref 135–145)

## 2018-03-02 SURGERY — COLECTOMY, PARTIAL
Anesthesia: General

## 2018-03-02 MED ORDER — KETOROLAC TROMETHAMINE 30 MG/ML IJ SOLN
30.0000 mg | Freq: Four times a day (QID) | INTRAMUSCULAR | Status: DC | PRN
  Administered 2018-03-03 – 2018-03-05 (×4): 30 mg via INTRAVENOUS
  Filled 2018-03-02 (×4): qty 1

## 2018-03-02 MED ORDER — ONDANSETRON HCL 4 MG/2ML IJ SOLN
INTRAMUSCULAR | Status: AC
  Filled 2018-03-02: qty 2

## 2018-03-02 MED ORDER — ONDANSETRON 4 MG PO TBDP: 4.0000 mg | ORAL_TABLET | Freq: Four times a day (QID) | ORAL | Status: DC | PRN

## 2018-03-02 MED ORDER — MIDAZOLAM HCL 2 MG/2ML IJ SOLN
INTRAMUSCULAR | Status: AC
  Filled 2018-03-02: qty 2

## 2018-03-02 MED ORDER — POVIDONE-IODINE 10 % EX OINT
TOPICAL_OINTMENT | CUTANEOUS | Status: AC
  Filled 2018-03-02: qty 1

## 2018-03-02 MED ORDER — FENTANYL CITRATE (PF) 250 MCG/5ML IJ SOLN
INTRAMUSCULAR | Status: AC
  Filled 2018-03-02: qty 5

## 2018-03-02 MED ORDER — HYDROMORPHONE HCL 1 MG/ML IJ SOLN
INTRAMUSCULAR | Status: AC
  Filled 2018-03-02: qty 0.5

## 2018-03-02 MED ORDER — CHLORHEXIDINE GLUCONATE CLOTH 2 % EX PADS: 6.0000 | MEDICATED_PAD | Freq: Once | CUTANEOUS | Status: DC

## 2018-03-02 MED ORDER — BUPIVACAINE LIPOSOME 1.3 % IJ SUSP
INTRAMUSCULAR | Status: DC | PRN
  Administered 2018-03-02: 20 mL

## 2018-03-02 MED ORDER — SUGAMMADEX SODIUM 200 MG/2ML IV SOLN
INTRAVENOUS | Status: DC | PRN
  Administered 2018-03-02: 200 mg via INTRAVENOUS

## 2018-03-02 MED ORDER — DEXAMETHASONE SODIUM PHOSPHATE 10 MG/ML IJ SOLN
INTRAMUSCULAR | Status: AC
  Filled 2018-03-02: qty 1

## 2018-03-02 MED ORDER — PRAVASTATIN SODIUM 10 MG PO TABS
20.0000 mg | ORAL_TABLET | Freq: Every day | ORAL | Status: DC
  Administered 2018-03-02 – 2018-03-05 (×4): 20 mg via ORAL
  Filled 2018-03-02 (×4): qty 2

## 2018-03-02 MED ORDER — DEXAMETHASONE SODIUM PHOSPHATE 10 MG/ML IJ SOLN
INTRAMUSCULAR | Status: DC | PRN
  Administered 2018-03-02: 8 mg via INTRAVENOUS

## 2018-03-02 MED ORDER — KETOROLAC TROMETHAMINE 30 MG/ML IJ SOLN: 30.0000 mg | Freq: Once | INTRAMUSCULAR | Status: DC | PRN

## 2018-03-02 MED ORDER — LACTATED RINGERS IV SOLN
INTRAVENOUS | Status: DC
  Administered 2018-03-02 (×2): via INTRAVENOUS
  Administered 2018-03-02: 1000 mL via INTRAVENOUS

## 2018-03-02 MED ORDER — DIPHENHYDRAMINE HCL 50 MG/ML IJ SOLN: 25.0000 mg | Freq: Four times a day (QID) | INTRAMUSCULAR | Status: DC | PRN

## 2018-03-02 MED ORDER — SODIUM CHLORIDE 0.9 % IV SOLN
INTRAVENOUS | Status: DC
  Administered 2018-03-02 – 2018-03-05 (×5): via INTRAVENOUS

## 2018-03-02 MED ORDER — TOPIRAMATE 100 MG PO TABS
100.0000 mg | ORAL_TABLET | Freq: Two times a day (BID) | ORAL | Status: DC
  Administered 2018-03-02 – 2018-03-05 (×6): 100 mg via ORAL
  Filled 2018-03-02 (×6): qty 1

## 2018-03-02 MED ORDER — HYDROMORPHONE HCL 1 MG/ML IJ SOLN
1.0000 mg | INTRAMUSCULAR | Status: DC | PRN
  Administered 2018-03-02 – 2018-03-05 (×11): 1 mg via INTRAVENOUS
  Filled 2018-03-02 (×11): qty 1

## 2018-03-02 MED ORDER — ONDANSETRON HCL 4 MG/2ML IJ SOLN: 4.0000 mg | Freq: Once | INTRAMUSCULAR | Status: DC | PRN

## 2018-03-02 MED ORDER — LISINOPRIL 10 MG PO TABS
10.0000 mg | ORAL_TABLET | Freq: Every day | ORAL | Status: DC
  Administered 2018-03-03 – 2018-03-05 (×3): 10 mg via ORAL
  Filled 2018-03-02 (×3): qty 1

## 2018-03-02 MED ORDER — FENTANYL CITRATE (PF) 100 MCG/2ML IJ SOLN
INTRAMUSCULAR | Status: DC | PRN
  Administered 2018-03-02 (×2): 50 ug via INTRAVENOUS
  Administered 2018-03-02: 100 ug via INTRAVENOUS
  Administered 2018-03-02 (×4): 50 ug via INTRAVENOUS
  Administered 2018-03-02: 100 ug via INTRAVENOUS

## 2018-03-02 MED ORDER — ONDANSETRON HCL 4 MG/2ML IJ SOLN
4.0000 mg | Freq: Once | INTRAMUSCULAR | Status: AC
  Administered 2018-03-02: 4 mg via INTRAVENOUS

## 2018-03-02 MED ORDER — PANTOPRAZOLE SODIUM 40 MG PO TBEC
40.0000 mg | DELAYED_RELEASE_TABLET | Freq: Every day | ORAL | Status: DC
  Administered 2018-03-02 – 2018-03-04 (×3): 40 mg via ORAL
  Filled 2018-03-02 (×3): qty 1

## 2018-03-02 MED ORDER — PROPOFOL 10 MG/ML IV BOLUS
INTRAVENOUS | Status: AC
  Filled 2018-03-02: qty 20

## 2018-03-02 MED ORDER — HYDROMORPHONE HCL 1 MG/ML IJ SOLN
1.0000 mg | Freq: Once | INTRAMUSCULAR | Status: AC
  Administered 2018-03-02: 1 mg via INTRAVENOUS

## 2018-03-02 MED ORDER — POVIDONE-IODINE 10 % OINT PACKET
TOPICAL_OINTMENT | CUTANEOUS | Status: DC | PRN
  Administered 2018-03-02: 1 via TOPICAL

## 2018-03-02 MED ORDER — ACETAMINOPHEN 325 MG PO TABS: 650.0000 mg | ORAL_TABLET | Freq: Four times a day (QID) | ORAL | Status: DC | PRN

## 2018-03-02 MED ORDER — HYDROCODONE-ACETAMINOPHEN 7.5-325 MG PO TABS: 1.0000 | ORAL_TABLET | Freq: Once | ORAL | Status: DC | PRN

## 2018-03-02 MED ORDER — ACETAMINOPHEN 650 MG RE SUPP: 650.0000 mg | Freq: Four times a day (QID) | RECTAL | Status: DC | PRN

## 2018-03-02 MED ORDER — ONDANSETRON HCL 4 MG/2ML IJ SOLN
INTRAMUSCULAR | Status: DC | PRN
  Administered 2018-03-02: 4 mg via INTRAVENOUS

## 2018-03-02 MED ORDER — BUPIVACAINE LIPOSOME 1.3 % IJ SUSP
INTRAMUSCULAR | Status: AC
  Filled 2018-03-02: qty 20

## 2018-03-02 MED ORDER — ENOXAPARIN SODIUM 40 MG/0.4ML ~~LOC~~ SOLN
40.0000 mg | SUBCUTANEOUS | Status: DC
  Administered 2018-03-03 – 2018-03-05 (×3): 40 mg via SUBCUTANEOUS
  Filled 2018-03-02 (×3): qty 0.4

## 2018-03-02 MED ORDER — KETOROLAC TROMETHAMINE 30 MG/ML IJ SOLN
30.0000 mg | Freq: Once | INTRAMUSCULAR | Status: AC
  Administered 2018-03-02: 30 mg via INTRAVENOUS
  Filled 2018-03-02: qty 1

## 2018-03-02 MED ORDER — SIMETHICONE 80 MG PO CHEW
40.0000 mg | CHEWABLE_TABLET | Freq: Four times a day (QID) | ORAL | Status: DC | PRN
  Administered 2018-03-03: 40 mg via ORAL
  Filled 2018-03-02: qty 1

## 2018-03-02 MED ORDER — SODIUM CHLORIDE 0.9 % IV SOLN
2.0000 g | INTRAVENOUS | Status: AC
  Administered 2018-03-02: 2 g via INTRAVENOUS
  Filled 2018-03-02: qty 2

## 2018-03-02 MED ORDER — SUGAMMADEX SODIUM 200 MG/2ML IV SOLN
INTRAVENOUS | Status: AC
  Filled 2018-03-02: qty 2

## 2018-03-02 MED ORDER — DEXMEDETOMIDINE HCL 200 MCG/2ML IV SOLN
INTRAVENOUS | Status: DC | PRN
  Administered 2018-03-02 (×2): 16 ug via INTRAVENOUS

## 2018-03-02 MED ORDER — 0.9 % SODIUM CHLORIDE (POUR BTL) OPTIME
TOPICAL | Status: DC | PRN
  Administered 2018-03-02 (×2): 1000 mL

## 2018-03-02 MED ORDER — MEPERIDINE HCL 50 MG/ML IJ SOLN: 6.2500 mg | INTRAMUSCULAR | Status: DC | PRN

## 2018-03-02 MED ORDER — KETOROLAC TROMETHAMINE 30 MG/ML IJ SOLN
30.0000 mg | Freq: Four times a day (QID) | INTRAMUSCULAR | Status: AC
  Administered 2018-03-02: 30 mg via INTRAVENOUS
  Filled 2018-03-02: qty 1

## 2018-03-02 MED ORDER — ALVIMOPAN 12 MG PO CAPS
12.0000 mg | ORAL_CAPSULE | ORAL | Status: AC
  Administered 2018-03-02: 12 mg via ORAL
  Filled 2018-03-02: qty 1

## 2018-03-02 MED ORDER — OXYCODONE-ACETAMINOPHEN 5-325 MG PO TABS
1.0000 | ORAL_TABLET | ORAL | Status: DC | PRN
  Administered 2018-03-02: 2 via ORAL
  Administered 2018-03-02: 1 via ORAL
  Administered 2018-03-03 (×3): 2 via ORAL
  Administered 2018-03-03: 1 via ORAL
  Administered 2018-03-03 – 2018-03-04 (×2): 2 via ORAL
  Administered 2018-03-04: 1 via ORAL
  Administered 2018-03-04 – 2018-03-05 (×5): 2 via ORAL
  Filled 2018-03-02 (×3): qty 2
  Filled 2018-03-02: qty 1
  Filled 2018-03-02 (×10): qty 2

## 2018-03-02 MED ORDER — ENOXAPARIN SODIUM 40 MG/0.4ML ~~LOC~~ SOLN
40.0000 mg | Freq: Once | SUBCUTANEOUS | Status: AC
  Administered 2018-03-02: 40 mg via SUBCUTANEOUS
  Filled 2018-03-02: qty 0.4

## 2018-03-02 MED ORDER — PROPOFOL 10 MG/ML IV BOLUS
INTRAVENOUS | Status: DC | PRN
  Administered 2018-03-02: 200 mg via INTRAVENOUS

## 2018-03-02 MED ORDER — MIDAZOLAM HCL 2 MG/2ML IJ SOLN
0.5000 mg | INTRAMUSCULAR | Status: DC | PRN
  Administered 2018-03-02: 2 mg via INTRAVENOUS

## 2018-03-02 MED ORDER — DEXMEDETOMIDINE HCL IN NACL 200 MCG/50ML IV SOLN
INTRAVENOUS | Status: AC
  Filled 2018-03-02: qty 50

## 2018-03-02 MED ORDER — LIDOCAINE HCL (CARDIAC) PF 50 MG/5ML IV SOSY
PREFILLED_SYRINGE | INTRAVENOUS | Status: DC | PRN
  Administered 2018-03-02: 60 mg via INTRAVENOUS

## 2018-03-02 MED ORDER — DIPHENHYDRAMINE HCL 25 MG PO CAPS: 25.0000 mg | ORAL_CAPSULE | Freq: Four times a day (QID) | ORAL | Status: DC | PRN

## 2018-03-02 MED ORDER — HYDROMORPHONE HCL 1 MG/ML IJ SOLN: 0.2500 mg | INTRAMUSCULAR | Status: DC | PRN

## 2018-03-02 MED ORDER — LORAZEPAM 2 MG/ML IJ SOLN: 1.0000 mg | INTRAMUSCULAR | Status: DC | PRN

## 2018-03-02 MED ORDER — ONDANSETRON HCL 4 MG/2ML IJ SOLN: 4.0000 mg | Freq: Four times a day (QID) | INTRAMUSCULAR | Status: DC | PRN

## 2018-03-02 MED ORDER — ALVIMOPAN 12 MG PO CAPS
12.0000 mg | ORAL_CAPSULE | Freq: Two times a day (BID) | ORAL | Status: DC
  Administered 2018-03-03 (×2): 12 mg via ORAL
  Filled 2018-03-02 (×3): qty 1

## 2018-03-02 MED ORDER — HYDROMORPHONE HCL 1 MG/ML IJ SOLN
1.0000 mg | INTRAMUSCULAR | Status: DC | PRN
  Administered 2018-03-02: 1 mg via INTRAVENOUS

## 2018-03-02 MED ORDER — ROCURONIUM 10MG/ML (10ML) SYRINGE FOR MEDFUSION PUMP - OPTIME
INTRAVENOUS | Status: DC | PRN
  Administered 2018-03-02 (×2): 10 mg via INTRAVENOUS
  Administered 2018-03-02: 50 mg via INTRAVENOUS
  Administered 2018-03-02: 10 mg via INTRAVENOUS

## 2018-03-02 SURGICAL SUPPLY — 58 items
BARRIER SKIN 2 3/4 (OSTOMY) IMPLANT
BARRIER SKIN OD2.25 2 3/4 FLNG (OSTOMY) IMPLANT
BRR SKN FLT 2.75X2.25 2 PC (OSTOMY)
CELLS DAT CNTRL 66122 CELL SVR (MISCELLANEOUS) IMPLANT
CLAMP POUCH DRAINAGE QUIET (OSTOMY) IMPLANT
COVER LIGHT HANDLE STERIS (MISCELLANEOUS) ×4 IMPLANT
DRSG OPSITE POSTOP 4X8 (GAUZE/BANDAGES/DRESSINGS) ×1 IMPLANT
ELECT REM PT RETURN 9FT ADLT (ELECTROSURGICAL) ×2
ELECTRODE REM PT RTRN 9FT ADLT (ELECTROSURGICAL) ×1 IMPLANT
GLOVE BIO SURGEON STRL SZ 6.5 (GLOVE) ×8 IMPLANT
GLOVE BIOGEL PI IND STRL 6.5 (GLOVE) ×2 IMPLANT
GLOVE BIOGEL PI IND STRL 7.0 (GLOVE) ×5 IMPLANT
GLOVE BIOGEL PI INDICATOR 6.5 (GLOVE) ×3
GLOVE BIOGEL PI INDICATOR 7.0 (GLOVE) ×5
GLOVE ECLIPSE 6.5 STRL STRAW (GLOVE) ×2 IMPLANT
GLOVE SURG SS PI 7.5 STRL IVOR (GLOVE) ×4 IMPLANT
GOWN STRL REUS W/ TWL XL LVL3 (GOWN DISPOSABLE) ×2 IMPLANT
GOWN STRL REUS W/TWL LRG LVL3 (GOWN DISPOSABLE) ×8 IMPLANT
GOWN STRL REUS W/TWL XL LVL3 (GOWN DISPOSABLE) ×4
INST SET MAJOR GENERAL (KITS) ×2 IMPLANT
KIT TURNOVER KIT A (KITS) ×2 IMPLANT
LIGASURE IMPACT 36 18CM CVD LR (INSTRUMENTS) ×2 IMPLANT
MANIFOLD NEPTUNE II (INSTRUMENTS) ×2 IMPLANT
NEEDLE HYPO 18GX1.5 BLUNT FILL (NEEDLE) ×2 IMPLANT
NS IRRIG 1000ML POUR BTL (IV SOLUTION) ×4 IMPLANT
PACK COLON (CUSTOM PROCEDURE TRAY) ×2 IMPLANT
PAD ARMBOARD 7.5X6 YLW CONV (MISCELLANEOUS) ×2 IMPLANT
PENCIL HANDSWITCHING (ELECTRODE) ×2 IMPLANT
POUCH OSTOMY 2 3/4  H 3804 (WOUND CARE)
POUCH OSTOMY 2 3/4 H 3804 (WOUND CARE)
POUCH OSTOMY 2 PC DRNBL 2.25 (WOUND CARE) IMPLANT
POUCH OSTOMY 2 PC DRNBL 2.75 (WOUND CARE) IMPLANT
POUCH OSTOMY DRNBL 2 1/4 (WOUND CARE)
RELOAD LINEAR CUT PROX 55 BLUE (ENDOMECHANICALS) IMPLANT
RELOAD PROXIMATE 75MM BLUE (ENDOMECHANICALS) ×4 IMPLANT
RELOAD STAPLE 55 3.8 BLU REG (ENDOMECHANICALS) IMPLANT
RELOAD STAPLE 75 3.8 BLU REG (ENDOMECHANICALS) IMPLANT
RETRACTOR WND ALEXIS 25 LRG (MISCELLANEOUS) IMPLANT
RETRACTOR WND ALEXIS-O 25 LRG (MISCELLANEOUS) ×1 IMPLANT
RTRCTR WOUND ALEXIS 18CM MED (MISCELLANEOUS)
RTRCTR WOUND ALEXIS 25CM LRG (MISCELLANEOUS)
RTRCTR WOUND ALEXIS O 25CM LRG (MISCELLANEOUS) ×2
SPONGE LAP 18X18 RF (DISPOSABLE) ×4 IMPLANT
STAPLER CUT CVD 40MM GREEN (STAPLE) ×1 IMPLANT
STAPLER GUN LINEAR PROX 60 (STAPLE) ×2 IMPLANT
STAPLER PROXIMATE 55 BLUE (STAPLE) IMPLANT
STAPLER PROXIMATE 75MM BLUE (STAPLE) ×1 IMPLANT
STAPLER VISISTAT (STAPLE) ×2 IMPLANT
SUT CHROMIC 0 SH (SUTURE) IMPLANT
SUT CHROMIC 2 0 SH (SUTURE) IMPLANT
SUT CHROMIC 3 0 SH 27 (SUTURE) IMPLANT
SUT NOVA NAB GS-26 0 60 (SUTURE) IMPLANT
SUT PDS AB 0 CTX 60 (SUTURE) ×2 IMPLANT
SUT SILK 2 0 (SUTURE)
SUT SILK 2-0 18XBRD TIE 12 (SUTURE) IMPLANT
SUT SILK 3 0 SH CR/8 (SUTURE) ×3 IMPLANT
TRAY FOLEY MTR SLVR 16FR STAT (SET/KITS/TRAYS/PACK) ×2 IMPLANT
YANKAUER SUCT BULB TIP 10FT TU (MISCELLANEOUS) ×2 IMPLANT

## 2018-03-02 NOTE — Op Note (Signed)
Patient:  Joel Jefferson.  DOB:  06/14/1975  MRN:  [id number]   Preop Diagnosis: Sigmoid colon carcinoma  Postop Diagnosis: Same  Procedure: Partial colectomy  Surgeon: Aviva Signs, MD  Assistant: Blake Divine, MD  Anes: General endotracheal  Indications: Patient is a 42 year old white male who was found on recent colonoscopy to have a sigmoid colon malignancy proven by biopsy.  The risks and benefits of the procedure including bleeding, infection, anastomotic leak, and the possibility of a blood transfusion were fully explained to the patient, who gave informed consent.  Of note was the fact that the patient woke up this morning with right flank pain, consistent with a previous episode of nephrolithiasis.  Procedure note: The patient was placed in the low lithotomy position after induction of general endotracheal anesthesia.  The abdomen was prepped and draped using the usual sterile technique with ChloraPrep.  Surgical site confirmation was performed.  The perineum was prepped using iodine.  A midline incision was made from the umbilicus to the suprapubic region.  The peritoneal cavity was entered into without difficulty.  Liver was palpated noted within normal limits.  In the distal sigmoid colon, a mass was found with previously inked tattoos.  The sigmoid colon was mobilized along its peritoneal reflection.  Care was taken to avoid the left ureter.  The proximal sigmoid colon and distal descending colon were mobilized along its peritoneal reflection.  A GIA-75 stapler was placed distal to the mass.  An additional GIA 75 stapler was placed in the proximal to mid sigmoid colon.  The mesentery was then divided using LigaSure.  A suture was placed distally on the specimen for orientation purposes.  It was sent to pathology for further examination.  A side-to-side colocolostomy was performed using a GIA-75 stapler.  The colotomy was closed using a TA 60 stapler.  Staple line was  bolstered using 3-0 silk sutures.  The anastomosis was inspected and noted to be widely patent.  The pelvis was then copiously irrigated with normal saline.  All operating personnel then changed her gown and gloves.  A new set-up was used for closure.  The fascia was reapproximated using a looped 0 PDS running suture.  The subcutaneous layer was irrigated with normal saline.  Exparel was instilled into the surrounding wound.  The skin was closed using staples.  Betadine ointment and dry sterile dressings were applied.  All tape and needle counts were correct at the end of the procedure.  Patient was extubated in the operating room and transferred to PACU in stable condition.  Complications: None  EBL: 50 cc  Specimen: Sigmoid colon, suture distal

## 2018-03-02 NOTE — Anesthesia Preprocedure Evaluation (Signed)
Anesthesia Evaluation  Patient identified by MRN, date of birth, ID band Patient awake    Reviewed: Allergy & Precautions, H&P , NPO status , Patient's Chart, lab work & pertinent test results  Airway Mallampati: II  TM Distance: >3 FB Neck ROM: full    Dental no notable dental hx.    Pulmonary neg pulmonary ROS, former smoker,    Pulmonary exam normal breath sounds clear to auscultation       Cardiovascular Exercise Tolerance: Good hypertension, negative cardio ROS   Rhythm:regular Rate:Normal     Neuro/Psych negative neurological ROS  negative psych ROS   GI/Hepatic Neg liver ROS, GERD  ,  Endo/Other  negative endocrine ROS  Renal/GU negative Renal ROSH/O Kidney stones.. Patients reports left stones noted on recent abdominal CT with Colon CA w/u.Marland Kitchen Patient with acute left sided flank pain this a.m. after colon prep.    negative genitourinary   Musculoskeletal   Abdominal   Peds  Hematology negative hematology ROS (+)   Anesthesia Other Findings   Reproductive/Obstetrics negative OB ROS                             Anesthesia Physical Anesthesia Plan  ASA: III  Anesthesia Plan: General   Post-op Pain Management:    Induction:   PONV Risk Score and Plan:   Airway Management Planned:   Additional Equipment:   Intra-op Plan:   Post-operative Plan:   Informed Consent: I have reviewed the patients History and Physical, chart, labs and discussed the procedure including the risks, benefits and alternatives for the proposed anesthesia with the patient or authorized representative who has indicated his/her understanding and acceptance.   Dental Advisory Given  Plan Discussed with: CRNA  Anesthesia Plan Comments: (Treating Left flank pain with dilaudid, versed, zofran.  Will obtain pre-op BMP to document creatinine.  Plan with Dr. Arnoldo Morale is to follow as needed for symptoms.)         Anesthesia Quick Evaluation

## 2018-03-02 NOTE — Progress Notes (Signed)
Here for surgery. C/O left flank pain. States he has had kidney stones in the past. Rates pain 10. Restless. Unable to stand or sit comfortably.  Dr Rick Duff and Dr Arnoldo Morale notified. Pt states that his girlfriend, Silvana Newness, is to make all important and legal decisions if needed.

## 2018-03-02 NOTE — Progress Notes (Signed)
Dr Arnoldo Morale and Dr Rick Duff at bedside to check pt. Orders given.

## 2018-03-02 NOTE — Progress Notes (Signed)
Appears more comfortable. States pain med is effective. Pain remains present but is tolerable. Resting quietly.

## 2018-03-02 NOTE — Anesthesia Postprocedure Evaluation (Signed)
Anesthesia Post Note  Patient: Joel Jefferson.  Procedure(s) Performed: PARTIAL COLECTOMY (N/A )  Patient location during evaluation: PACU Anesthesia Type: General Level of consciousness: awake and alert and patient cooperative Pain management: satisfactory to patient Vital Signs Assessment: post-procedure vital signs reviewed and stable Respiratory status: spontaneous breathing Cardiovascular status: stable Postop Assessment: no apparent nausea or vomiting Anesthetic complications: no     Last Vitals:  Vitals:   03/02/18 1317 03/02/18 1326  BP: (!) 163/93   Pulse: (!) 112   Resp: 17 13  Temp:    SpO2: 94%     Last Pain:  Vitals:   03/02/18 1326  PainSc: 4                  Rachelle Edwards

## 2018-03-02 NOTE — Interval H&P Note (Signed)
History and Physical Interval Note:  03/02/2018 9:58 AM  Joel Jefferson.  has presented today for surgery, with the diagnosis of colon mass  The various methods of treatment have been discussed with the patient and family. After consideration of risks, benefits and other options for treatment, the patient has consented to  Procedure(s): PARTIAL COLECTOMY (N/A) as a surgical intervention .  The patient's history has been reviewed, patient examined, no change in status, stable for surgery.  I have reviewed the patient's chart and labs.  Questions were answered to the patient's satisfaction.     Aviva Signs

## 2018-03-02 NOTE — Transfer of Care (Signed)
Immediate Anesthesia Transfer of Care Note  Patient: Joel Jefferson.  Procedure(s) Performed: PARTIAL COLECTOMY (N/A )  Patient Location: PACU  Anesthesia Type:General  Level of Consciousness: awake, alert  and patient cooperative  Airway & Oxygen Therapy: Patient Spontanous Breathing  Post-op Assessment: Report given to RN and Post -op Vital signs reviewed and stable  Post vital signs: Reviewed and stable  Last Vitals:  Vitals Value Taken Time  BP    Temp    Pulse 116 03/02/2018 12:57 PM  Resp 15 03/02/2018 12:57 PM  SpO2 96 % 03/02/2018 12:57 PM  Vitals shown include unvalidated device data.  Last Pain:  Vitals:   03/02/18 1106  PainSc: 8          Complications: No apparent anesthesia complications

## 2018-03-02 NOTE — Anesthesia Procedure Notes (Signed)
Procedure Name: Intubation Date/Time: 03/02/2018 12:27 PM Performed by: Ollen Bowl, CRNA Pre-anesthesia Checklist: Patient identified, Patient being monitored, Timeout performed, Emergency Drugs available and Suction available Patient Re-evaluated:Patient Re-evaluated prior to induction Oxygen Delivery Method: Circle System Utilized Preoxygenation: Pre-oxygenation with 100% oxygen Induction Type: IV induction Ventilation: Mask ventilation without difficulty Laryngoscope Size: Mac, 3 and Miller Grade View: Grade II Tube type: Oral Tube size: 7.0 mm Number of attempts: 2 Airway Equipment and Method: stylet Placement Confirmation: ETT inserted through vocal cords under direct vision,  positive ETCO2 and breath sounds checked- equal and bilateral Secured at: 21 cm Tube secured with: Tape Dental Injury: Teeth and Oropharynx as per pre-operative assessment  Comments: Able to see cords, just difficulty keeping them in site to intubate, better with Miller 3.

## 2018-03-03 ENCOUNTER — Encounter (HOSPITAL_COMMUNITY): Payer: Self-pay | Admitting: General Surgery

## 2018-03-03 LAB — CBC
HEMATOCRIT: 38.5 % — AB (ref 39.0–52.0)
HEMOGLOBIN: 12.7 g/dL — AB (ref 13.0–17.0)
MCH: 29.3 pg (ref 26.0–34.0)
MCHC: 33 g/dL (ref 30.0–36.0)
MCV: 88.7 fL (ref 80.0–100.0)
NRBC: 0 % (ref 0.0–0.2)
Platelets: 236 10*3/uL (ref 150–400)
RBC: 4.34 MIL/uL (ref 4.22–5.81)
RDW: 13.1 % (ref 11.5–15.5)
WBC: 12.8 10*3/uL — ABNORMAL HIGH (ref 4.0–10.5)

## 2018-03-03 LAB — PHOSPHORUS: Phosphorus: 4.3 mg/dL (ref 2.5–4.6)

## 2018-03-03 LAB — BASIC METABOLIC PANEL
ANION GAP: 8 (ref 5–15)
BUN: 18 mg/dL (ref 6–20)
CO2: 19 mmol/L — AB (ref 22–32)
Calcium: 8.3 mg/dL — ABNORMAL LOW (ref 8.9–10.3)
Chloride: 109 mmol/L (ref 98–111)
Creatinine, Ser: 1.67 mg/dL — ABNORMAL HIGH (ref 0.61–1.24)
GFR calc Af Amer: 57 mL/min — ABNORMAL LOW (ref >60)
GFR calc non Af Amer: 49 mL/min — ABNORMAL LOW (ref >60)
GLUCOSE: 139 mg/dL — AB (ref 70–99)
POTASSIUM: 4 mmol/L (ref 3.5–5.1)
Sodium: 136 mmol/L (ref 135–145)

## 2018-03-03 LAB — MAGNESIUM: Magnesium: 1.9 mg/dL (ref 1.7–2.4)

## 2018-03-03 MED ORDER — INFLUENZA VAC SPLIT QUAD 0.5 ML IM SUSY
0.5000 mL | PREFILLED_SYRINGE | INTRAMUSCULAR | Status: AC
  Administered 2018-03-05: 0.5 mL via INTRAMUSCULAR
  Filled 2018-03-03: qty 0.5

## 2018-03-03 MED ORDER — INFLUENZA VAC SPLIT HIGH-DOSE 0.5 ML IM SUSY: 0.5000 mL | PREFILLED_SYRINGE | INTRAMUSCULAR | Status: DC

## 2018-03-03 NOTE — Progress Notes (Signed)
1 Day Post-Op  Subjective: Patient has mild incisional pain.  His left flank pain from nephrolithiasis is significantly improved.  Objective: Vital signs in last 24 hours: Temp:  [97.9 F (36.6 C)-98.7 F (37.1 C)] 97.9 F (36.6 C) (11/26 0315) Pulse Rate:  [84-121] 84 (11/26 0315) Resp:  [13-24] 24 (11/25 1910) BP: (100-163)/(65-95) 103/70 (11/26 0315) SpO2:  [93 %-99 %] 99 % (11/26 0315) Weight:  [102 kg] 102 kg (11/25 1451) Last BM Date: 03/02/18  Intake/Output from previous day: 11/25 0701 - 11/26 0700 In: 3977.7 [P.O.:480; I.V.:3497.7] Out: 550 [Urine:500; Blood:50] Intake/Output this shift: No intake/output data recorded.  General appearance: alert, cooperative and no distress Resp: clear to auscultation bilaterally Cardio: regular rate and rhythm, S1, S2 normal, no murmur, click, rub or gallop GI: Soft, incision healing well.  Active bowel sounds appreciated.  Lab Results:  Recent Labs    03/03/18 0513  WBC 12.8*  HGB 12.7*  HCT 38.5*  PLT 236   BMET Recent Labs    03/02/18 1021 03/03/18 0513  NA 138 136  K 3.2* 4.0  CL 109 109  CO2 19* 19*  GLUCOSE 202* 139*  BUN 16 18  CREATININE 1.24 1.67*  CALCIUM 8.9 8.3*   PT/INR No results for input(s): LABPROT, INR in the last 72 hours.  Studies/Results: No results found.  Anti-infectives: Anti-infectives (From admission, onward)   Start     Dose/Rate Route Frequency Ordered Stop   03/02/18 1100  cefoTEtan (CEFOTAN) 2 g in sodium chloride 0.9 % 100 mL IVPB     2 g 200 mL/hr over 30 Minutes Intravenous On call to O.R. 03/02/18 0934 03/02/18 1119      Assessment/Plan: s/p Procedure(s): PARTIAL COLECTOMY Impression: Stable on postoperative day 1.  Patient has unrecorded urine in the Foley bag which is full.  His creatinine did bump up compared to yesterday.  This is most likely secondary to surgery and his recent kidney stone.  We will monitor his creatinine.  Will remove Foley today.  Continue IV  fluid rate at 125 cc an hour.  Advance to full liquid diet.  Should his creatinine continued to increase, will get ultrasound.  Clinically, his left-sided nephrolithiasis seems to have resolved.  LOS: 1 day    Aviva Signs 03/03/2018

## 2018-03-04 ENCOUNTER — Inpatient Hospital Stay (HOSPITAL_COMMUNITY): Payer: BLUE CROSS/BLUE SHIELD

## 2018-03-04 LAB — BASIC METABOLIC PANEL
Anion gap: 4 — ABNORMAL LOW (ref 5–15)
BUN: 19 mg/dL (ref 6–20)
CALCIUM: 8 mg/dL — AB (ref 8.9–10.3)
CHLORIDE: 112 mmol/L — AB (ref 98–111)
CO2: 21 mmol/L — ABNORMAL LOW (ref 22–32)
Creatinine, Ser: 1.85 mg/dL — ABNORMAL HIGH (ref 0.61–1.24)
GFR calc Af Amer: 51 mL/min — ABNORMAL LOW (ref >60)
GFR, EST NON AFRICAN AMERICAN: 44 mL/min — AB (ref >60)
Glucose, Bld: 120 mg/dL — ABNORMAL HIGH (ref 70–99)
Potassium: 3.9 mmol/L (ref 3.5–5.1)
SODIUM: 137 mmol/L (ref 135–145)

## 2018-03-04 LAB — CBC
HCT: 32.2 % — ABNORMAL LOW (ref 39.0–52.0)
Hemoglobin: 10.5 g/dL — ABNORMAL LOW (ref 13.0–17.0)
MCH: 28.7 pg (ref 26.0–34.0)
MCHC: 32.6 g/dL (ref 30.0–36.0)
MCV: 88 fL (ref 80.0–100.0)
PLATELETS: 147 10*3/uL — AB (ref 150–400)
RBC: 3.66 MIL/uL — ABNORMAL LOW (ref 4.22–5.81)
RDW: 13.4 % (ref 11.5–15.5)
WBC: 8.3 10*3/uL (ref 4.0–10.5)
nRBC: 0 % (ref 0.0–0.2)

## 2018-03-04 LAB — MAGNESIUM: MAGNESIUM: 1.9 mg/dL (ref 1.7–2.4)

## 2018-03-04 LAB — PHOSPHORUS: Phosphorus: 3.2 mg/dL (ref 2.5–4.6)

## 2018-03-04 MED ORDER — TAMSULOSIN HCL 0.4 MG PO CAPS: 0.4000 mg | ORAL_CAPSULE | Freq: Every day | ORAL | 2 refills | Status: DC

## 2018-03-04 MED ORDER — OXYCODONE-ACETAMINOPHEN 7.5-325 MG PO TABS: 1.0000 | ORAL_TABLET | Freq: Four times a day (QID) | ORAL | 0 refills | Status: DC | PRN

## 2018-03-04 MED ORDER — TAMSULOSIN HCL 0.4 MG PO CAPS
0.4000 mg | ORAL_CAPSULE | Freq: Every day | ORAL | Status: DC
  Administered 2018-03-04 – 2018-03-05 (×2): 0.4 mg via ORAL
  Filled 2018-03-04 (×2): qty 1

## 2018-03-04 NOTE — Progress Notes (Signed)
2 Days Post-Op  Subjective: Patient still having intermittent episodes of left flank pain.  He is also having episodes of dysuria and hesitancy.  His bowel function has returned and he is tolerating regular diet well.  Objective: Vital signs in last 24 hours: Temp:  [98.2 F (36.8 C)-98.7 F (37.1 C)] 98.6 F (37 C) (11/27 0532) Pulse Rate:  [85-93] 93 (11/27 0532) BP: (114-125)/(75-77) 117/75 (11/27 0532) SpO2:  [96 %-98 %] 98 % (11/27 0532) Last BM Date: 03/04/18  Intake/Output from previous day: 11/26 0701 - 11/27 0700 In: 720 [P.O.:720] Out: 1070 [Urine:1070] Intake/Output this shift: No intake/output data recorded.  General appearance: alert, cooperative and no distress Resp: clear to auscultation bilaterally Cardio: regular rate and rhythm, S1, S2 normal, no murmur, click, rub or gallop GI: Soft, active bowel sounds appreciated.  Incision healing well.  Lab Results:  Recent Labs    03/03/18 0513 03/04/18 0457  WBC 12.8* 8.3  HGB 12.7* 10.5*  HCT 38.5* 32.2*  PLT 236 147*   BMET Recent Labs    03/03/18 0513 03/04/18 0457  NA 136 137  K 4.0 3.9  CL 109 112*  CO2 19* 21*  GLUCOSE 139* 120*  BUN 18 19  CREATININE 1.67* 1.85*  CALCIUM 8.3* 8.0*   PT/INR No results for input(s): LABPROT, INR in the last 72 hours.  Studies/Results: No results found.  Anti-infectives: Anti-infectives (From admission, onward)   Start     Dose/Rate Route Frequency Ordered Stop   03/02/18 1100  cefoTEtan (CEFOTAN) 2 g in sodium chloride 0.9 % 100 mL IVPB     2 g 200 mL/hr over 30 Minutes Intravenous On call to O.R. 03/02/18 0934 03/02/18 1119      Assessment/Plan: s/p Procedure(s): PARTIAL COLECTOMY Impression: Stable on postoperative day 2.  Bowel function has returned.  Will get ultrasound of kidneys to assess for nephrolithiasis.  Will start Flomax.  LOS: 2 days    Aviva Signs 03/04/2018

## 2018-03-05 LAB — CBC
HEMATOCRIT: 30.8 % — AB (ref 39.0–52.0)
HEMOGLOBIN: 10.1 g/dL — AB (ref 13.0–17.0)
MCH: 28.9 pg (ref 26.0–34.0)
MCHC: 32.8 g/dL (ref 30.0–36.0)
MCV: 88 fL (ref 80.0–100.0)
Platelets: 151 10*3/uL (ref 150–400)
RBC: 3.5 MIL/uL — ABNORMAL LOW (ref 4.22–5.81)
RDW: 13.3 % (ref 11.5–15.5)
WBC: 7.9 10*3/uL (ref 4.0–10.5)
nRBC: 0 % (ref 0.0–0.2)

## 2018-03-05 LAB — BASIC METABOLIC PANEL
Anion gap: 6 (ref 5–15)
BUN: 16 mg/dL (ref 6–20)
CHLORIDE: 112 mmol/L — AB (ref 98–111)
CO2: 21 mmol/L — ABNORMAL LOW (ref 22–32)
CREATININE: 1.77 mg/dL — AB (ref 0.61–1.24)
Calcium: 8.2 mg/dL — ABNORMAL LOW (ref 8.9–10.3)
GFR calc Af Amer: 54 mL/min — ABNORMAL LOW (ref >60)
GFR, EST NON AFRICAN AMERICAN: 46 mL/min — AB (ref >60)
Glucose, Bld: 119 mg/dL — ABNORMAL HIGH (ref 70–99)
Potassium: 3.8 mmol/L (ref 3.5–5.1)
SODIUM: 139 mmol/L (ref 135–145)

## 2018-03-05 NOTE — Discharge Summary (Signed)
Physician Discharge Summary  Patient ID: Joel Jefferson. MRN: [id number] DOB/AGE: [birth year] 42 y.o.  Admit date: 03/02/2018 Discharge date: 03/05/2018  Admission Diagnoses: Sigmoid colon carcinoma  Discharge Diagnoses: Same, left nephrolithiasis Active Problems:   Cancer of sigmoid colon ([place])   S/P partial colectomy   Discharged Condition: good  Hospital Course: Patient is a 42 year old white male who was found on recent colonoscopy to have a sigmoid colon mass.  Biopsy of this was positive for colon cancer.  Patient subsequently underwent a partial colectomy on 03/02/2018.  The morning of surgery, he presented with left flank pain, consistent with his history of nephrolithiasis.  During his hospitalization, an ultrasound and KUB were performed which revealed a nonobstructing kidney stone in the left ureter.  His creatinine level did rise, but is subsequently starting to improve.  His left flank pain has resolved.  Final pathology reveals a stage III colon cancer.  Patient and wife aware.  His diet was advanced without difficulty once his bowel function returned.  Patient is being discharged home on 03/05/2018 in good and improving condition.   Treatments: surgery: Partial colectomy on 03/02/2018  Discharge Exam: Blood pressure 126/79, pulse 90, temperature 98.9 F (37.2 C), temperature source Oral, resp. rate 18, weight 102 kg, SpO2 99 %. General appearance: alert, cooperative and no distress Resp: clear to auscultation bilaterally Cardio: regular rate and rhythm, S1, S2 normal, no murmur, click, rub or gallop GI: Soft, incision healing well.  Bowel sounds active.  Disposition: Discharge disposition: 01-Home or Self Care       Discharge Instructions    Diet - low sodium heart healthy   Complete by:  As directed    Increase activity slowly   Complete by:  As directed      Allergies as of 03/05/2018   No Known Allergies     Medication List    STOP taking  these medications   metroNIDAZOLE 500 MG tablet Commonly known as:  FLAGYL   neomycin 500 MG tablet Commonly known as:  MYCIFRADIN     TAKE these medications   linaclotide 145 MCG Caps capsule Commonly known as:  LINZESS Take 1 capsule (145 mcg total) by mouth daily before breakfast. What changed:    when to take this  reasons to take this   lisinopril 10 MG tablet Commonly known as:  PRINIVIL,ZESTRIL Take 10 mg by mouth daily.   Magnesium 500 MG Caps Take 500 mg by mouth daily.   omeprazole 20 MG capsule Commonly known as:  PRILOSEC Take 20 mg by mouth 2 (two) times daily.   oxyCODONE-acetaminophen 7.5-325 MG tablet Commonly known as:  PERCOCET Take 1 tablet by mouth every 6 (six) hours as needed for severe pain.   POTASSIMIN PO Take 1 tablet by mouth daily.   pravastatin 20 MG tablet Commonly known as:  PRAVACHOL Take 20 mg by mouth daily.   tamsulosin 0.4 MG Caps capsule Commonly known as:  FLOMAX Take 1 capsule (0.4 mg total) by mouth daily after breakfast.   topiramate 100 MG tablet Commonly known as:  TOPAMAX Take 100 mg by mouth 2 (two) times daily.      Follow-up Information    Aviva Signs, MD. Schedule an appointment as soon as possible for a visit on 03/12/2018.   Specialty:  General Surgery Contact information: [address] [place] [address] [phone]           Signed: Aviva Signs 03/05/2018, 8:05 AM

## 2018-03-05 NOTE — Discharge Instructions (Signed)
Open Colectomy, Care After °This sheet gives you information about how to care for yourself after your procedure. Your health care provider may also give you more specific instructions. If you have problems or questions, contact your health care provider. °What can I expect after the procedure? °After the procedure, it is common to have: °· Pain in your abdomen, especially along your incision. °· Tiredness. Your energy level will return to normal over the next several weeks. °· Constipation. °· Nausea. °· Difficulty urinating. ° °Follow these instructions at home: °Activity °· You may be able to return to most of your normal activities within 1-2 weeks, such as working, walking up stairs, and sexual activity. °· Avoid activities that require a lot of energy for 4-6 weeks after surgery, such as running, climbing, and lifting heavy objects. Ask your health care provider what activities are safe for you. °· Take rest breaks during the day as needed. °· Do not drive for 1-2 weeks or until your health care provider says that it is safe. °· Do not drive or use heavy machinery while taking prescription pain medicines. °· Do not lift anything that is heavier than 10 lb (4.3 kg) until your health care provider says that it is safe. °Incision care °· Follow instructions from your health care provider about how to take care of your incision. Make sure you: °? Wash your hands with soap and water before you change your bandage (dressing). If soap and water are not available, use hand sanitizer. °? Change your dressing as told by your health care provider. °? Leave stitches (sutures) or staples in place. These skin closures may need to stay in place for 2 weeks or longer. °· Avoid wearing tight clothing around your incision. °· Protect your incision area from the sun. °· Check your incision area every day for signs of infection. Check for: °? More redness, swelling, or pain. °? More fluid or blood. °? Warmth. °? Pus or a bad  smell. °General instructions °· Do not take baths, swim, or use a hot tub until your health care provider approves. Ask your health care provider when you may shower. °· Take over-the-counter and prescription medicines, including stool softeners, only as told by your health care provider. °· Eat a low-fat and low-fiber diet for the first 4 weeks after surgery. °· Keep all follow-up visits as told by your health care provider. This is important. °Contact a health care provider if: °· You have more redness, swelling, or pain around your incision. °· You have more fluid or blood coming from your incision. °· Your incision feels warm to the touch. °· You have pus or a bad smell coming from your incision. °· You have a fever or chills. °· You do not have a bowel movement 2-3 days after surgery. °· You cannot eat or drink for 24 hours or more. °· You have persistent nausea and vomiting. °· You have abdominal pain that gets worse and does not get better with medicine. °Get help right away if: °· You have chest pain. °· You have shortness of breath. °· You have pain or swelling in your legs. °· Your incision breaks open after your sutures or staples have been removed. °· You have bleeding from the rectum. °This information is not intended to replace advice given to you by your health care provider. Make sure you discuss any questions you have with your health care provider. °Document Released: 10/16/2010 Document Revised: 12/25/2015 Document Reviewed: 12/25/2015 °[employer]vier Interactive Patient   Education © 2018 [employer] ° °

## 2018-03-12 ENCOUNTER — Emergency Department (HOSPITAL_COMMUNITY)
Admission: EM | Admit: 2018-03-12 | Discharge: 2018-03-12 | Disposition: A | Discharge status: Home / Self Care | Payer: BLUE CROSS/BLUE SHIELD | Attending: Emergency Medicine | Admitting: Emergency Medicine

## 2018-03-12 ENCOUNTER — Inpatient Hospital Stay (HOSPITAL_COMMUNITY): Payer: BLUE CROSS/BLUE SHIELD | Attending: Hematology | Admitting: Hematology

## 2018-03-12 ENCOUNTER — Encounter: Payer: Self-pay | Admitting: General Surgery

## 2018-03-12 ENCOUNTER — Encounter (HOSPITAL_COMMUNITY): Payer: Self-pay | Admitting: Hematology

## 2018-03-12 ENCOUNTER — Ambulatory Visit (INDEPENDENT_AMBULATORY_CARE_PROVIDER_SITE_OTHER): Payer: Self-pay | Admitting: General Surgery

## 2018-03-12 ENCOUNTER — Other Ambulatory Visit: Payer: Self-pay

## 2018-03-12 ENCOUNTER — Encounter (HOSPITAL_COMMUNITY): Payer: Self-pay | Admitting: *Deleted

## 2018-03-12 VITALS — BP 107/68 | HR 91 | Temp 97.8°F | Resp 16 | Ht 70.0 in | Wt 212.8 lb

## 2018-03-12 VITALS — BP 147/86 | HR 92 | Temp 97.3°F | Resp 18 | Wt 212.2 lb

## 2018-03-12 DIAGNOSIS — I10 Essential (primary) hypertension: Secondary | ICD-10-CM | POA: Insufficient documentation

## 2018-03-12 DIAGNOSIS — Z87891 Personal history of nicotine dependence: Secondary | ICD-10-CM | POA: Diagnosis not present

## 2018-03-12 DIAGNOSIS — C187 Malignant neoplasm of sigmoid colon: Secondary | ICD-10-CM

## 2018-03-12 DIAGNOSIS — Z09 Encounter for follow-up examination after completed treatment for conditions other than malignant neoplasm: Secondary | ICD-10-CM

## 2018-03-12 DIAGNOSIS — K219 Gastro-esophageal reflux disease without esophagitis: Secondary | ICD-10-CM | POA: Diagnosis not present

## 2018-03-12 DIAGNOSIS — Z79899 Other long term (current) drug therapy: Secondary | ICD-10-CM | POA: Diagnosis not present

## 2018-03-12 DIAGNOSIS — Z803 Family history of malignant neoplasm of breast: Secondary | ICD-10-CM | POA: Diagnosis not present

## 2018-03-12 DIAGNOSIS — L7622 Postprocedural hemorrhage and hematoma of skin and subcutaneous tissue following other procedure: Secondary | ICD-10-CM | POA: Insufficient documentation

## 2018-03-12 DIAGNOSIS — K91840 Postprocedural hemorrhage and hematoma of a digestive system organ or structure following a digestive system procedure: Secondary | ICD-10-CM | POA: Diagnosis not present

## 2018-03-12 DIAGNOSIS — R58 Hemorrhage, not elsewhere classified: Secondary | ICD-10-CM

## 2018-03-12 DIAGNOSIS — E7800 Pure hypercholesterolemia, unspecified: Secondary | ICD-10-CM | POA: Diagnosis not present

## 2018-03-12 NOTE — ED Provider Notes (Signed)
[facility] EMERGENCY DEPARTMENT Provider Note   CSN: [id number] Arrival date & time: 03/12/18  1001     History   Chief Complaint Chief Complaint  Patient presents with  . Post-op Problem    HPI Joel Serena. is a 42 y.o. male.  Patient has had colon surgery and was to get his staples out today when he noticed bleeding from the top of his wound.  The history is provided by the patient. No language interpreter was used.  Illness  This is a new problem. The current episode started less than 1 hour ago. The problem occurs constantly. The problem has not changed since onset.Pertinent negatives include no chest pain, no abdominal pain and no headaches. Nothing aggravates the symptoms. Nothing relieves the symptoms. He has tried nothing for the symptoms. The treatment provided no relief.    Past Medical History:  Diagnosis Date  . GERD (gastroesophageal reflux disease)   . History of kidney stones   . Hypercholesterolemia   . Hypertension   . Urethral stricture     Patient Active Problem List   Diagnosis Date Noted  . S/P partial colectomy 03/02/2018  . Cancer of sigmoid colon ([place])   . Rectal bleeding 02/17/2018  . LLQ pain 02/17/2018  . Abnormal computed tomography of sigmoid colon 02/17/2018  . Constipation 02/17/2018  . Dysphagia 10/13/2014  . GERD (gastroesophageal reflux disease) 10/13/2014    Past Surgical History:  Procedure Laterality Date  . BALLOON DILATION  04/06/2012   Procedure: BALLOON DILATION;  Surgeon: Reece Packer, MD;  Location: [facility];  Service: Urology;  Laterality: N/A;  . BIOPSY  02/24/2018   Procedure: BIOPSY;  Surgeon: Danie Binder, MD;  [facility];  Service: Endoscopy;;  colon  . COLONOSCOPY WITH PROPOFOL N/A 02/24/2018   Procedure: COLONOSCOPY WITH PROPOFOL;  Surgeon: Danie Binder, MD;  Location: [facility];  Service: Endoscopy;  Laterality: N/A;  9:30am  . ESOPHAGOGASTRODUODENOSCOPY  (EGD) WITH PROPOFOL N/A 08/29/2015   Dr. Oneida Alar: possible proximal esophageal web s/p dilation, moderate gastritis, negative eosinophilic esophagitis   . PARTIAL COLECTOMY N/A 03/02/2018   Procedure: PARTIAL COLECTOMY;  Surgeon: Aviva Signs, MD;  Location: [facility];  Service: General;  Laterality: N/A;  . SAVORY DILATION N/A 08/29/2015   Procedure: SAVORY DILATION;  Surgeon: Danie Binder, MD;  Location: [facility];  Service: Endoscopy;  Laterality: N/A;  . URETHRAL DILATION    . WISDOM TOOTH EXTRACTION          Home Medications    Prior to Admission medications   Medication Sig Start Date End Date Taking? Authorizing Provider  linaclotide (LINZESS) 145 MCG CAPS capsule Take 1 capsule (145 mcg total) by mouth daily before breakfast. Patient taking differently: Take 145 mcg by mouth daily as needed (for constipation).  02/17/18   Mahala Menghini, PA-C  lisinopril (PRINIVIL,ZESTRIL) 10 MG tablet Take 10 mg by mouth daily.  09/21/14   [provider]  Magnesium 500 MG CAPS Take 500 mg by mouth daily.     [provider]  omeprazole (PRILOSEC) 20 MG capsule Take 20 mg by mouth 2 (two) times daily.    [provider]  oxyCODONE-acetaminophen (PERCOCET) 7.5-325 MG tablet Take 1 tablet by mouth every 6 (six) hours as needed for severe pain. 03/04/18 03/04/19  Aviva Signs, MD  Potassium (POTASSIMIN PO) Take 1 tablet by mouth daily.     [provider]  pravastatin (PRAVACHOL) 20  MG tablet Take 20 mg by mouth daily.  08/30/14   [provider]  tamsulosin (FLOMAX) 0.4 MG CAPS capsule Take 1 capsule (0.4 mg total) by mouth daily after breakfast. 03/04/18   Aviva Signs, MD  topiramate (TOPAMAX) 100 MG tablet Take 100 mg by mouth 2 (two) times daily.     [provider]    Family History Family History  Problem Relation Age of Onset  . Colon cancer Neg Hx     Social History Social History   Tobacco Use  . Smoking status: Former  Smoker    Packs/day: 0.25    Years: 9.00    Pack years: 2.25    Types: Cigarettes    Last attempt to quit: 07/27/2011    Years since quitting: 6.6  . Smokeless tobacco: Never Used  Substance Use Topics  . Alcohol use: Yes    Alcohol/week: 6.0 standard drinks    Types: 6 Cans of beer per week  . Drug use: No     Allergies   Patient has no known allergies.   Review of Systems Review of Systems  Constitutional: Negative for appetite change and fatigue.  HENT: Negative for congestion, ear discharge and sinus pressure.   Eyes: Negative for discharge.  Respiratory: Negative for cough.   Cardiovascular: Negative for chest pain.  Gastrointestinal: Negative for abdominal pain and diarrhea.       Bleeding from abdominal wound  Genitourinary: Negative for frequency and hematuria.  Musculoskeletal: Negative for back pain.  Skin: Negative for rash.  Neurological: Negative for seizures and headaches.  Psychiatric/Behavioral: Negative for hallucinations.     Physical Exam Updated Vital Signs BP 137/73   Pulse 85   Temp 98 F (36.7 C) (Oral)   Resp 16   Ht 5\' 10"  (1.778 m)   Wt 102 kg   SpO2 100%   BMI 32.27 kg/m   Physical Exam  Constitutional: He is oriented to person, place, and time. He appears well-developed.  HENT:  Head: Normocephalic.  Eyes: Conjunctivae and EOM are normal. No scleral icterus.  Neck: Neck supple. No thyromegaly present.  Cardiovascular: Normal rate and regular rhythm. Exam reveals no gallop and no friction rub.  No murmur heard. Pulmonary/Chest: No stridor. He has no wheezes. He has no rales. He exhibits no tenderness.  Abdominal: He exhibits no distension. There is no tenderness. There is no rebound.  Patient has a vertical incision with numerous staples.  It is leaking some blood from the superior part of the wound.  This was stopped with pressure  Musculoskeletal: Normal range of motion. He exhibits no edema.  Lymphadenopathy:    He has no  cervical adenopathy.  Neurological: He is oriented to person, place, and time. He exhibits normal muscle tone. Coordination normal.  Skin: No rash noted. No erythema.  Psychiatric: He has a normal mood and affect. His behavior is normal.     ED Treatments / Results  Labs (all labs ordered are listed, but only abnormal results are displayed) Labs Reviewed - No data to display  EKG None  Radiology No results found.  Procedures Procedures (including critical care time)  Medications Ordered in ED Medications - No data to display   Initial Impression / Assessment and Plan / ED Course  I have reviewed the triage vital signs and the nursing notes.  Pertinent labs & imaging results that were available during my care of the patient were reviewed by me and considered in my medical decision making (  see chart for details).     Patient will go to his surgeon's office now to be examined.  I spoke with the general surgeon Dr. Arnoldo Morale and he suggested having the patient come to the office.  Final Clinical Impressions(s) / ED Diagnoses   Final diagnoses:  Bleeding    ED Discharge Orders    None       Milton Ferguson, MD 03/12/18 1036

## 2018-03-12 NOTE — Progress Notes (Signed)
Subjective:     Joel Jefferson.  Status post partial colectomy.  Patient woke up this morning and had a rush of old blood emanating from the superior aspect of his incision.  Patient had a low-grade fever yesterday. Objective:    BP (!) 147/86 (BP Location: Left Arm, Patient Position: Sitting, Cuff Size: Normal)   Pulse 92   Temp (!) 97.3 F (36.3 C) (Temporal)   Resp 18   Wt 212 lb 3.2 oz (96.3 kg)   BMI 30.45 kg/m   General:  alert, cooperative and no distress  Abdomen is soft.  Incision healing well with surrounding resolving ecchymosis present.  I was able to get a small amount of hematoma evacuated through the superior aspect of the incision.  Fascia appears to be intact.  No purulent drainage noted. Final pathology has already been reviewed with the patient.     Assessment:    Resolving hematoma of the wound.   Stage III adenocarcinoma of the colon   Plan:   I told the patient to keep the wound clean.  The resolving hematoma should improve over the next 24 to 48 hours.  No need for surgical intervention.  He is to see oncology later today.  We will see patient back again in 5 days for wound check.

## 2018-03-12 NOTE — Discharge Instructions (Addendum)
Go to Dr. Arnoldo Morale office now

## 2018-03-12 NOTE — Progress Notes (Signed)
[facility] NOTE  Patient Care Team: Sharilyn Sites, MD as PCP - General (Family Medicine) Danie Binder, MD as Consulting Physician (Gastroenterology)  CHIEF COMPLAINTS/PURPOSE OF CONSULTATION:  Newly diagnosed stage III colon cancer.  HISTORY OF PRESENTING ILLNESS:  Joel Jefferson. 42 y.o. male is seen in consultation today for further work-up and management of newly diagnosed stage III sigmoid colon cancer.  He has been having on and off left lower quadrant abdominal pain and right scrotum started in October.  He denies any major changes in his bowel habits although he has constipation.  Denies any bleeding per rectum or melena.  He was seen by primary doctor and a CT scan of the abdomen and pelvis was ordered.  This was done at [place] which showed fatty liver and thickening of the sigmoid colon.  He underwent a colonoscopy on 03/06/2018 which showed a mass in the sigmoid colon region.  This was biopsied and consistent with adenocarcinoma.  CEA level was 1.7.  He underwent sigmoid colon segmental resection on 03/02/2018.  Pathology showed invasive adenocarcinoma with 14 out of 35 lymph nodes positive with 3 satellite nodules.  CT scan of the chest done on 02/26/2018 showed 2 small pulmonary nodules in both lungs.  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  No known exposure of nuclear radiation.  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  He had intentional weight loss of total of 30 pounds, 10 pounds since surgery.  He smoked one third of a pack per day for 5 to 6 years and quit smoking in 2013.  He is accompanied by his wife today.  Family history was significant for 2 maternal aunts with breast cancer.  Otherwise does not have any chronic medical problems and does not take any medications.  He reportedly had some bleeding from the incision site this morning and went to the ER.  He was  evaluated by Dr. Arnoldo Morale.  He has a follow-up with him next week.  MEDICAL HISTORY:  Past Medical History:  Diagnosis Date  . GERD (gastroesophageal reflux disease)   . History of kidney stones   . Hypercholesterolemia   . Hypertension   . Urethral stricture     SURGICAL HISTORY: Past Surgical History:  Procedure Laterality Date  . BALLOON DILATION  04/06/2012   Procedure: BALLOON DILATION;  Surgeon: Reece Packer, MD;  Location: [facility];  Service: Urology;  Laterality: N/A;  . BIOPSY  02/24/2018   Procedure: BIOPSY;  Surgeon: Danie Binder, MD;  [facility];  Service: Endoscopy;;  colon  . COLONOSCOPY WITH PROPOFOL N/A 02/24/2018   Procedure: COLONOSCOPY WITH PROPOFOL;  Surgeon: Danie Binder, MD;  [facility];  Service: Endoscopy;  Laterality: N/A;  9:30am  . ESOPHAGOGASTRODUODENOSCOPY (EGD) WITH PROPOFOL N/A 08/29/2015   Dr. Oneida Alar: possible proximal esophageal web s/p dilation, moderate gastritis, negative eosinophilic esophagitis   . PARTIAL COLECTOMY N/A 03/02/2018   Procedure: PARTIAL COLECTOMY;  Surgeon: Aviva Signs, MD;  Location: [facility];  Service: General;  Laterality: N/A;  . SAVORY DILATION N/A 08/29/2015   Procedure: SAVORY DILATION;  Surgeon: Danie Binder, MD;  [facility];  Service: Endoscopy;  Laterality: N/A;  . URETHRAL DILATION    . WISDOM TOOTH EXTRACTION      SOCIAL HISTORY: Social History  Socioeconomic History  . Marital status: Divorced    Spouse name: Not on file  . Number of children: Not on file  . Years of education: Not on file  . Highest education level: Not on file  Occupational History  . Not on file  Social Needs  . Financial resource strain: Not on file  . Food insecurity:    Worry: Not on file    Inability: Not on file  . Transportation needs:    Medical: Not on file    Non-medical: Not on file  Tobacco Use  . Smoking status: Former Smoker    Packs/day: 0.25     Years: 9.00    Pack years: 2.25    Types: Cigarettes    Last attempt to quit: 07/27/2011    Years since quitting: 6.6  . Smokeless tobacco: Never Used  Substance and Sexual Activity  . Alcohol use: Yes    Alcohol/week: 6.0 standard drinks    Types: 6 Cans of beer per week  . Drug use: No  . Sexual activity: Yes    Birth control/protection: None  Lifestyle  . Physical activity:    Days per week: Not on file    Minutes per session: Not on file  . Stress: Not on file  Relationships  . Social connections:    Talks on phone: Not on file    Gets together: Not on file    Attends religious service: Not on file    Active member of club or organization: Not on file    Attends meetings of clubs or organizations: Not on file    Relationship status: Not on file  . Intimate partner violence:    Fear of current or ex partner: Not on file    Emotionally abused: Not on file    Physically abused: Not on file    Forced sexual activity: Not on file  Other Topics Concern  . Not on file  Social History Narrative  . Not on file    FAMILY HISTORY: Family History  Problem Relation Age of Onset  . Colon cancer Neg Hx     ALLERGIES:  has No Known Allergies.  MEDICATIONS:  Current Outpatient Medications  Medication Sig Dispense Refill  . linaclotide (LINZESS) 145 MCG CAPS capsule Take 1 capsule (145 mcg total) by mouth daily before breakfast. (Patient taking differently: Take 145 mcg by mouth daily as needed (for constipation). ) 30 capsule 3  . lisinopril (PRINIVIL,ZESTRIL) 10 MG tablet Take 10 mg by mouth daily.   5  . Magnesium 500 MG CAPS Take 500 mg by mouth daily.     Marland Kitchen omeprazole (PRILOSEC) 20 MG capsule Take 20 mg by mouth 2 (two) times daily.    Marland Kitchen oxyCODONE-acetaminophen (PERCOCET) 7.5-325 MG tablet Take 1 tablet by mouth every 6 (six) hours as needed for severe pain. 25 tablet 0  . Potassium (POTASSIMIN PO) Take 1 tablet by mouth daily.     . pravastatin (PRAVACHOL) 20 MG tablet  Take 20 mg by mouth daily.   1  . tamsulosin (FLOMAX) 0.4 MG CAPS capsule Take 1 capsule (0.4 mg total) by mouth daily after breakfast. 30 capsule 2  . topiramate (TOPAMAX) 100 MG tablet Take 100 mg by mouth 2 (two) times daily.      No current facility-administered medications for this visit.     REVIEW OF SYSTEMS:   Constitutional: Denies fevers, chills or abnormal night sweats Eyes: Denies blurriness of vision, double vision or watery eyes  Ears, nose, mouth, throat, and face: Denies mucositis or sore throat Respiratory: Denies cough, dyspnea or wheezes Cardiovascular: Denies palpitation, chest discomfort or lower extremity swelling Gastrointestinal: Occasional nausea.  Denies any bleeding per rectum or melena. Skin: Denies abnormal skin rashes Lymphatics: Denies new lymphadenopathy or easy bruising Neurological:Denies numbness, tingling or new weaknesses Behavioral/Psych: Mood is stable, no new changes  All other systems were reviewed with the patient and are negative.  PHYSICAL EXAMINATION: ECOG PERFORMANCE STATUS: 0 - Asymptomatic  Vitals:   03/12/18 1432  BP: 107/68  Pulse: 91  Resp: 16  Temp: 97.8 F (36.6 C)  SpO2: 100%   Filed Weights   03/12/18 1432  Weight: 212 lb 12.8 oz (96.5 kg)    GENERAL:alert, no distress and comfortable SKIN: skin color, texture, turgor are normal, no rashes or significant lesions EYES: normal, conjunctiva are pink and non-injected, sclera clear OROPHARYNX:no exudate, no erythema and lips, buccal mucosa, and tongue normal  NECK: supple, thyroid normal size, non-tender, without nodularity LYMPH:  no palpable lymphadenopathy in the cervical, axillary or inguinal LUNGS: clear to auscultation and percussion with normal breathing effort HEART: regular rate & rhythm and no murmurs and no lower extremity edema ABDOMEN: Soft.  Abdominal wound with staples.  No palpable hepatosplenomegaly. Musculoskeletal:no cyanosis of digits and no clubbing   PSYCH: alert & oriented x 3 with fluent speech NEURO: no focal motor/sensory deficits  LABORATORY DATA:  I have reviewed the data as listed Lab Results  Component Value Date   WBC 7.9 03/05/2018   HGB 10.1 (L) 03/05/2018   HCT 30.8 (L) 03/05/2018   MCV 88.0 03/05/2018   PLT 151 03/05/2018     Chemistry      Component Value Date/Time   NA 139 03/05/2018 0447   K 3.8 03/05/2018 0447   CL 112 (H) 03/05/2018 0447   CO2 21 (L) 03/05/2018 0447   BUN 16 03/05/2018 0447   CREATININE 1.77 (H) 03/05/2018 0447      Component Value Date/Time   CALCIUM 8.2 (L) 03/05/2018 0447   ALKPHOS 47 02/24/2018 1047   AST 22 02/24/2018 1047   ALT 31 02/24/2018 1047   BILITOT 0.6 02/24/2018 1047       RADIOGRAPHIC STUDIES: I have personally reviewed the radiological images as listed and agreed with the findings in the report. Dg Abd 1 View  Result Date: 03/04/2018 CLINICAL DATA:  Left flank pain EXAM: ABDOMEN - 1 VIEW COMPARISON:  CT 01/03/2017 FINDINGS: 10 mm calcification in the lower pole of the left kidney. Calcification in the left lower pelvis could reflect distal left ureteral stone or phlebolith. This measures 4 mm. Nonobstructive bowel gas pattern. No organomegaly or free air. Skin staples over the pelvis. IMPRESSION: Left lower pole nephrolithiasis. 4 mm left pelvic calcification. This could reflect a distal left ureteral stone or calcified phlebolith. If further evaluation is felt warranted, CT urogram may be helpful. Electronically Signed   By: Rolm Baptise M.D.   On: 03/04/2018 11:24   Ct Chest W Contrast  Result Date: 02/26/2018 CLINICAL DATA:  42 year old male with history of recently diagnosed colon cancer of the sigmoid colon status post surgical resection on 03/02/2018. No current chest complaints. EXAM: CT CHEST WITH CONTRAST TECHNIQUE: Multidetector CT imaging of the chest was performed during intravenous contrast administration. CONTRAST:  30m OMNIPAQUE IOHEXOL 300 MG/ML   SOLN COMPARISON:  No priors. FINDINGS: Cardiovascular: Heart size is normal. There is no significant pericardial fluid, thickening or pericardial calcification. No atherosclerotic calcifications  are noted in the thoracic aorta or the coronary arteries. Mediastinum/Nodes: No pathologically enlarged mediastinal or hilar lymph nodes. Esophagus is unremarkable in appearance. No axillary lymphadenopathy. Lungs/Pleura: 7 x 5 mm (mean diameter of 6 mm) subpleural nodule associated with the right major fissure in the anterior aspect of the superior segment of the right lower lobe (axial image 69 of series 5) which appears relatively high attenuation (99 HU), favored to represent a partially calcified subpleural lymph node. 4 mm left lower lobe nodule (axial image 116 of series 5). No other larger more suspicious appearing pulmonary nodules or masses are noted. No acute consolidative airspace disease. No pleural effusions. Upper Abdomen: Mild diffuse low attenuation throughout the visualized portions of the hepatic parenchyma, suggestive of hepatic steatosis. Musculoskeletal: There are no aggressive appearing lytic or blastic lesions noted in the visualized portions of the skeleton. IMPRESSION: 1. No definite findings to strongly suggest metastatic disease to the thorax. There are 2 small pulmonary nodules in the lungs bilaterally, nonspecific, but favored to be benign. Attention at time of routine follow-up surveillance scans is recommended to ensure the stability of these findings. 2. Mild hepatic steatosis. Electronically Signed   By: Vinnie Langton M.D.   On: 02/26/2018 08:58   US Renal  Result Date: 03/04/2018 CLINICAL DATA:  Right flank pain.  History of nephrolithiasis EXAM: RENAL / URINARY TRACT ULTRASOUND COMPLETE COMPARISON:  CT 01/03/2017 FINDINGS: Right Kidney: Renal measurements: 13.0 x 6.6 x 6.7 cm = volume: 299 mL. 8 mm shadowing midpole stone. No hydronephrosis. Normal echotexture. Left Kidney: Renal  measurements: 13.3 x 6.7 x 6.9 cm = volume: 319 mL. Slight left hydronephrosis. No mass. Normal echotexture. Bladder: Appears normal for degree of bladder distention. IMPRESSION: Slight left hydronephrosis. Recommend clinical correlation for left flank pain or hematuria. This could be further evaluated with CT urogram if felt clinically indicated. Right midpole nephrolithiasis. Electronically Signed   By: Rolm Baptise M.D.   On: 03/04/2018 11:26    ASSESSMENT & PLAN:  Cancer of sigmoid colon ([place]) 1.  Stage III (pT3 pN2b) sigmoid colon adenocarcinoma: - Patient had left lower quadrant abdominal pain since October, CT scan [facility] of the abdomen pelvis showed sigmoid colon thickening. -he underwent colonoscopy on 03/06/2018 showing a fungating infiltrative and polypoid partially obstructing large mass in the sigmoid colon between 20-25 cm from the anal verge.  This was consistent with colon cancer. - He underwent sigmoid colon segmental resection on 03/02/2018 by Dr. Arnoldo Morale.  This showed invasive adenocarcinoma, well-differentiated, 3.8 cm, tumor invades through muscularis propria, resection margins negative, 14/35 lymph nodes positive, satellite nodules x3, PT 3, PN2B, MMR preserved. - CT of the chest on 02/26/2018 shows 2 small pulmonary nodules, 7 x 5 mm subpleural nodule in the right major fissure and the 4 mm left lower lobe nodule.  Mild hepatic steatosis was seen. - I have discussed the pathology report and scan findings with the patient and his wife in detail. - I have recommended a whole-body PET CT scan to be done approximately 4 weeks after surgery. - We will do MSI testing.  I will ask Dr. Arnoldo Morale for port placement for administration of 6 months of adjuvant chemotherapy with FOLFOX regimen. -We talked about FOLFOX regimen and side effects briefly.  We will talk in detail when he comes back after the scans.  2.  Family history: -2 maternal aunts had breast cancer.  No family  history of colorectal cancer. - I have also recommended genetic testing  because of his young age at diagnosis.  Orders Placed This Encounter  Procedures  . NM PET Image Initial (PI) Skull Base To Thigh    Standing Status:   Future    Standing Expiration Date:   03/12/2019    Order Specific Question:   ** REASON FOR EXAM (FREE TEXT)    Answer:   colon cancer    Order Specific Question:   If indicated for the ordered procedure, I authorize the administration of a radiopharmaceutical per Radiology protocol    Answer:   Yes    Order Specific Question:   Preferred imaging location?    Answer:   [facility]    Order Specific Question:   Radiology Contrast Protocol - do NOT remove file path    Answer:   [email or web address]    All questions were answered. The patient knows to call the clinic with any problems, questions or concerns.      Derek Jack, MD 03/12/2018 3:55 PM

## 2018-03-12 NOTE — ED Notes (Signed)
Pt bleeding to upper portion of staples in abdomen. Have covered with an ABD pad. Bleeding is now controlled.

## 2018-03-12 NOTE — Patient Instructions (Signed)
[facility] at [facility] Discharge Instructions     Thank you for choosing [facility] at [facility] to provide your oncology and hematology care.  To afford each patient quality time with our provider, please arrive at least 15 minutes before your scheduled appointment time.   If you have a lab appointment with the Cancer Center please come in thru the  Main Entrance and check in at the main information desk  You need to re-schedule your appointment should you arrive 10 or more minutes late.  We strive to give you quality time with our providers, and arriving late affects you and other patients whose appointments are after yours.  Also, if you no show three or more times for appointments you may be dismissed from the clinic at the providers discretion.     Again, thank you for choosing [facility].  Our hope is that these requests will decrease the amount of time that you wait before being seen by our physicians.       _____________________________________________________________  Should you have questions after your visit to [facility], please contact our office at [phone] between the hours of 8:00 a.m. and 4:30 p.m.  Voicemails left after 4:00 p.m. will not be returned until the following business day.  For prescription refill requests, have your pharmacy contact our office and allow 72 hours.    Cancer Center Support Programs:   > Cancer Support Group  2nd Tuesday of the month 1pm-2pm, Journey Room    

## 2018-03-12 NOTE — ED Triage Notes (Signed)
Pt c/o bleeding to surgical site from recent partial colectomy on 11/25 from Dr. Arnoldo Morale. Pt's staples are still intact, but bleeding is coming from the top of the surgical site. Bleeding controlled at this time with ABD pad applied.

## 2018-03-12 NOTE — Assessment & Plan Note (Signed)
1.  Stage III (pT3 pN2b) sigmoid colon adenocarcinoma: - Patient had left lower quadrant abdominal pain since October, CT scan done at [facility] of the abdomen pelvis showed sigmoid colon thickening. -he underwent colonoscopy on 03/06/2018 showing a fungating infiltrative and polypoid partially obstructing large mass in the sigmoid colon between 20-25 cm from the anal verge.  This was consistent with colon cancer. - He underwent sigmoid colon segmental resection on 03/02/2018 by Dr. Arnoldo Morale.  This showed invasive adenocarcinoma, well-differentiated, 3.8 cm, tumor invades through muscularis propria, resection margins negative, 14/35 lymph nodes positive, satellite nodules x3, PT 3, PN2B, MMR preserved. - CT of the chest on 02/26/2018 shows 2 small pulmonary nodules, 7 x 5 mm subpleural nodule in the right major fissure and the 4 mm left lower lobe nodule.  Mild hepatic steatosis was seen. - I have discussed the pathology report and scan findings with the patient and his wife in detail. - I have recommended a whole-body PET CT scan to be done approximately 4 weeks after surgery. - We will do MSI testing.  I will ask Dr. Arnoldo Morale for port placement for administration of 6 months of adjuvant chemotherapy with FOLFOX regimen. -We talked about FOLFOX regimen and side effects briefly.  We will talk in detail when he comes back after the scans.  2.  Family history: -2 maternal aunts had breast cancer.  No family history of colorectal cancer. - I have also recommended genetic testing because of his young age at diagnosis.

## 2018-03-17 ENCOUNTER — Ambulatory Visit (INDEPENDENT_AMBULATORY_CARE_PROVIDER_SITE_OTHER): Payer: Self-pay | Admitting: General Surgery

## 2018-03-17 ENCOUNTER — Encounter: Payer: Self-pay | Admitting: General Surgery

## 2018-03-17 VITALS — BP 120/80 | HR 111 | Temp 96.8°F | Resp 20 | Wt 207.6 lb

## 2018-03-17 DIAGNOSIS — Z09 Encounter for follow-up examination after completed treatment for conditions other than malignant neoplasm: Secondary | ICD-10-CM

## 2018-03-17 MED ORDER — TRAMADOL HCL 50 MG PO TABS: 50.0000 mg | ORAL_TABLET | Freq: Four times a day (QID) | ORAL | 0 refills | Status: DC | PRN

## 2018-03-17 MED ORDER — ONDANSETRON HCL 4 MG PO TABS: 4.0000 mg | ORAL_TABLET | Freq: Three times a day (TID) | ORAL | 1 refills | Status: DC | PRN

## 2018-03-17 NOTE — Patient Instructions (Signed)
Implanted Port Insertion  Implanted port insertion is a procedure to put in a port and catheter. The port is a device with an injectable disk that can be accessed by your health care provider. The port is connected to a vein in the chest or neck by a small flexible tube (catheter). There are different types of ports. The implanted port may be used as a long-term IV access for:  · Medicines, such as chemotherapy.  · Fluids.  · Liquid nutrition, such as total parenteral nutrition (TPN).  · Blood samples.    Having a port means that your health care provider will not need to use the veins in your arms for these procedures.  Tell a health care provider about:  · Any allergies you have.  · All medicines you are taking, especially blood thinners, as well as any vitamins, herbs, eye drops, creams, over-the-counter medicines, and steroids.  · Any problems you or family members have had with anesthetic medicines.  · Any blood disorders you have.  · Any surgeries you have had.  · Any medical conditions you have, including diabetes or kidney problems.  · Whether you are pregnant or may be pregnant.  What are the risks?  Generally, this is a safe procedure. However, problems may occur, including:  · Allergic reactions to medicines or dyes.  · Damage to other structures or organs.  · Infection.  · Damage to the blood vessel, bruising, or bleeding at the puncture site.  · Blood clot.  · Breakdown of the skin over the port.  · A collection of air in the chest that can cause one of the lungs to collapse (pneumothorax). This is rare.    What happens before the procedure?  Staying hydrated  Follow instructions from your health care provider about hydration, which may include:  · Up to 2 hours before the procedure - you may continue to drink clear liquids, such as water, clear fruit juice, black coffee, and plain tea.    Eating and drinking restrictions  · Follow instructions from your health care provider about eating and drinking,  which may include:  ? 8 hours before the procedure - stop eating heavy meals or foods such as meat, fried foods, or fatty foods.  ? 6 hours before the procedure - stop eating light meals or foods, such as toast or cereal.  ? 6 hours before the procedure - stop drinking milk or drinks that contain milk.  ? 2 hours before the procedure - stop drinking clear liquids.  Medicines  · Ask your health care provider about:  ? Changing or stopping your regular medicines. This is especially important if you are taking diabetes medicines or blood thinners.  ? Taking medicines such as aspirin and ibuprofen. These medicines can thin your blood. Do not take these medicines before your procedure if your health care provider instructs you not to.  · You may be given antibiotic medicine to help prevent infection.  General instructions  · Plan to have someone take you home from the hospital or clinic.  · If you will be going home right after the procedure, plan to have someone with you for 24 hours.  · You may have blood tests.  · You may be asked to shower with a germ-killing soap.  What happens during the procedure?  · To lower your risk of infection:  ? Your health care team will wash or sanitize their hands.  ? Your skin will be washed with   soap.  ? Hair may be removed from the surgical area.  · An IV tube will be inserted into one of your veins.  · You will be given one or more of the following:  ? A medicine to help you relax (sedative).  ? A medicine to numb the area (local anesthetic).  · Two small cuts (incisions) will be made to insert the port.  ? One incision will be made in your neck to get access to the vein where the catheter will lie.  ? The other incision will be made in the upper chest. This is where the port will lie.  · The procedure may be done using continuous X-ray (fluoroscopy) or other imaging tools for guidance.  · The port and catheter will be placed. There may be a small, raised area where the port  is.  · The port will be flushed with a salt solution (saline), and blood will be drawn to make sure that it is working correctly.  · The incisions will be closed.  · Bandages (dressings) may be placed over the incisions.  The procedure may vary among health care providers and hospitals.  What happens after the procedure?  · Your blood pressure, heart rate, breathing rate, and blood oxygen level will be monitored until the medicines you were given have worn off.  · Do not drive for 24 hours if you were given a sedative.  · You will be given a manufacturer's information card for the type of port that you have. Keep this with you.  · Your port will need to be flushed and checked as told by your health care provider, usually every few weeks.  · A chest X-ray will be done to:  ? Check the placement of the port.  ? Make sure there is no injury to your lung.  Summary  · Implanted port insertion is a procedure to put in a port and catheter.  · The implanted port is used as a long-term IV access.  · The port will need to be flushed and checked as told by your health care provider, usually every few weeks.  · Keep your manufacturer's information card with you at all times.  This information is not intended to replace advice given to you by your health care provider. Make sure you discuss any questions you have with your health care provider.  Document Released: 01/13/2013 Document Revised: 02/14/2016 Document Reviewed: 02/14/2016  Elsevier Interactive Patient Education © 2017 Elsevier Inc.

## 2018-03-17 NOTE — Progress Notes (Signed)
Subjective:     Joel Jefferson.  Here for follow-up.  Bleeding from incision has stopped.  Has been seen by oncology.     BP 120/80 (BP Location: Left Arm, Patient Position: Sitting, Cuff Size: Normal)   Pulse (!) 111   Temp (!) 96.8 F (36 C) (Temporal)   Resp 20   Wt 207 lb 9.6 oz (94.2 kg)   BMI 29.79 kg/m   General:  alert, cooperative and no distress  Abdomen soft, incision healing well.  Staples removed, Steri-Strips applied.  Ecchymosis almost resolved.    Assessment:    Doing well postoperatively.    Plan:   Patient will call to schedule Port-A-Cath insertion.  The risks and benefits of the procedure including bleeding, infection, and pneumothorax were fully explained to the patient, who gave informed consent.  Literature was given.

## 2018-03-19 ENCOUNTER — Encounter (HOSPITAL_COMMUNITY): Payer: Self-pay | Admitting: Genetic Counselor

## 2018-03-19 ENCOUNTER — Inpatient Hospital Stay (HOSPITAL_BASED_OUTPATIENT_CLINIC_OR_DEPARTMENT_OTHER): Payer: BLUE CROSS/BLUE SHIELD | Admitting: Genetic Counselor

## 2018-03-19 ENCOUNTER — Inpatient Hospital Stay (HOSPITAL_COMMUNITY): Payer: BLUE CROSS/BLUE SHIELD

## 2018-03-19 DIAGNOSIS — Z7183 Encounter for nonprocreative genetic counseling: Secondary | ICD-10-CM | POA: Diagnosis not present

## 2018-03-19 DIAGNOSIS — C187 Malignant neoplasm of sigmoid colon: Secondary | ICD-10-CM | POA: Diagnosis not present

## 2018-03-19 DIAGNOSIS — Z803 Family history of malignant neoplasm of breast: Secondary | ICD-10-CM | POA: Insufficient documentation

## 2018-03-19 NOTE — Progress Notes (Signed)
REFERRING PROVIDER: Derek Jack, MD [address] [place], [place] [address]  PRIMARY PROVIDER:  Sharilyn Sites, MD  PRIMARY REASON FOR VISIT:  1. Cancer of sigmoid colon ([place])   2. Family history of breast cancer      HISTORY OF PRESENT ILLNESS:   Joel Jefferson, a 42 y.o. male, was seen for a [place] cancer genetics consultation at the request of Dr. Delton Coombes due to a personal and family history of cancer.  Joel Jefferson presents to clinic today to discuss the possibility of a hereditary predisposition to cancer, genetic testing, and to further clarify his future cancer risks, as well as potential cancer risks for family members.   In Fall 2019, at the age of 53, Joel Jefferson was diagnosed with cancer of the sigmoid colon. MSI and IHC was normal. This was treated with surgery.    CANCER HISTORY:   No history exists.     Past Medical History:  Diagnosis Date  . Family history of breast cancer   . GERD (gastroesophageal reflux disease)   . History of kidney stones   . Hypercholesterolemia   . Hypertension   . Urethral stricture     Past Surgical History:  Procedure Laterality Date  . BALLOON DILATION  04/06/2012   Procedure: BALLOON DILATION;  Surgeon: Reece Packer, MD;  [facility];  Service: Urology;  Laterality: N/A;  . BIOPSY  02/24/2018   Procedure: BIOPSY;  Surgeon: Danie Binder, MD;  Location: [facility];  Service: Endoscopy;;  colon  . COLONOSCOPY WITH PROPOFOL N/A 02/24/2018   Procedure: COLONOSCOPY WITH PROPOFOL;  Surgeon: Danie Binder, MD;  Location: [facility];  Service: Endoscopy;  Laterality: N/A;  9:30am  . ESOPHAGOGASTRODUODENOSCOPY (EGD) WITH PROPOFOL N/A 08/29/2015   Dr. Oneida Alar: possible proximal esophageal web s/p dilation, moderate gastritis, negative eosinophilic esophagitis   . PARTIAL COLECTOMY N/A 03/02/2018   Procedure: PARTIAL COLECTOMY;  Surgeon: Aviva Signs, MD;  Location: [facility];  Service: General;   Laterality: N/A;  . SAVORY DILATION N/A 08/29/2015   Procedure: SAVORY DILATION;  Surgeon: Danie Binder, MD;  Location: [facility];  Service: Endoscopy;  Laterality: N/A;  . URETHRAL DILATION    . WISDOM TOOTH EXTRACTION      Social History   Socioeconomic History  . Marital status: Divorced    Spouse name: Not on file  . Number of children: Not on file  . Years of education: Not on file  . Highest education level: Not on file  Occupational History  . Not on file  Social Needs  . Financial resource strain: Not on file  . Food insecurity:    Worry: Not on file    Inability: Not on file  . Transportation needs:    Medical: Not on file    Non-medical: Not on file  Tobacco Use  . Smoking status: Former Smoker    Packs/day: 0.25    Years: 9.00    Pack years: 2.25    Types: Cigarettes    Last attempt to quit: 07/27/2011    Years since quitting: 6.6  . Smokeless tobacco: Never Used  Substance and Sexual Activity  . Alcohol use: Yes    Alcohol/week: 6.0 standard drinks    Types: 6 Cans of beer per week  . Drug use: No  . Sexual activity: Yes    Birth control/protection: None  Lifestyle  . Physical activity:    Days per week: Not on file  Minutes per session: Not on file  . Stress: Not on file  Relationships  . Social connections:    Talks on phone: Not on file    Gets together: Not on file    Attends religious service: Not on file    Active member of club or organization: Not on file    Attends meetings of clubs or organizations: Not on file    Relationship status: Not on file  Other Topics Concern  . Not on file  Social History Narrative  . Not on file     FAMILY HISTORY:  We obtained a detailed, 4-generation family history.  Significant diagnoses are listed below: Family History  Problem Relation Age of Onset  . Dementia Mother   . Dementia Maternal Aunt   . Dementia Maternal Grandmother   . COPD Maternal Grandfather   . Breast cancer Maternal Aunt  42  . Breast cancer Maternal Aunt        dx in her 79s  . Lung cancer Maternal Aunt   . Healthy Son   . Colon cancer Neg Hx     The patient has one son and one sister who are cancer free.  Both parents are living.  The patient's mother was diagnosed with dementia 4 years ago.  She has four sisters and three brothers.  Two sisters had breast cancer, one at age 56. Another sister has dementia. This sister has a daughter with skin cancer. The maternal grandparents are cancer free, but the grandmother died from complications of dementia.  The patient's father is cancer free.  He has two sisters who are cancer free. And his parents died of non cancer related issues.  Joel Jefferson is unaware of previous family history of genetic testing for hereditary cancer risks. Patient's maternal ancestors are of [place] descent, and paternal ancestors are of [place] descent. There is no reported Ashkenazi Jewish ancestry. There is no known consanguinity.  GENETIC COUNSELING ASSESSMENT: Joel Garciamartinez. is a 42 y.o. male with a personal and family history of cancer which is somewhat suggestive of a hereditary cancer syndrome and predisposition to cancer. We, therefore, discussed and recommended the following at today's visit.   DISCUSSION: We discussed that about 5-7% of colon cancer is due to hereditary cancer syndromes, most commonly Lynch syndrome.  Testing on the tumor was normal and therefore the risk is lower for the patient to test positive for Lynch syndrome.  There are other genes that can increase the risk for colon cancer as well.  About 5-10% of breast cancer is hereditary with most cases due to BRCA mutations. However, colon cancer is not a hallmark cancer of BRCA mutations.    We reviewed the characteristics, features and inheritance patterns of hereditary cancer syndromes. We also discussed genetic testing, including the appropriate family members to test, the process of testing, insurance coverage and  turn-around-time for results. We discussed the implications of a negative, positive and/or variant of uncertain significant result. We recommended Joel Jefferson pursue genetic testing for the multi cancer gene panel. The Multi-Gene Panel offered by [facility] includes sequencing and/or deletion duplication testing of the following 84 genes: AIP, ALK, APC, ATM, AXIN2,BAP1,  BARD1, BLM, BMPR1A, BRCA1, BRCA2, BRIP1, CASR, CDC73, CDH1, CDK4, CDKN1B, CDKN1C, CDKN2A (p14ARF), CDKN2A (p16INK4a), CEBPA, CHEK2, CTNNA1, DICER1, DIS3L2, EGFR (c.2369C>T, p.Thr790Met variant only), EPCAM (Deletion/duplication testing only), FH, FLCN, GATA2, GPC3, GREM1 (Promoter region deletion/duplication testing only), HOXB13 (c.251G>A, p.Gly84Glu), HRAS, KIT, MAX, MEN1, MET, MITF (c.952G>A, p.Glu318Lys variant only),  MLH1, MSH2, MSH3, MSH6, MUTYH, NBN, NF1, NF2, NTHL1, PALB2, PDGFRA, PHOX2B, PMS2, POLD1, POLE, POT1, PRKAR1A, PTCH1, PTEN, RAD50, RAD51C, RAD51D, RB1, RECQL4, RET, RUNX1, SDHAF2, SDHA (sequence changes only), SDHB, SDHC, SDHD, SMAD4, SMARCA4, SMARCB1, SMARCE1, STK11, SUFU, TERC, TERT, TMEM127, TP53, TSC1, TSC2, VHL, WRN and WT1.    Based on Joel Jefferson personal and family history of cancer, he meets medical criteria for genetic testing. Despite that he meets criteria, he may still have an out of pocket cost. We discussed that if his out of pocket cost for testing is over $100, the laboratory will call and confirm whether he wants to proceed with testing.  If the out of pocket cost of testing is less than $100 he will be billed by the genetic testing laboratory.   PLAN: After considering the risks, benefits, and limitations, Joel Jefferson  provided informed consent to pursue genetic testing and the blood sample was sent to [facility] for analysis of the Multi cancer panel. Results should be available within approximately 2-3 weeks' time, at which point they will be disclosed by telephone to Joel Jefferson, as will any additional  recommendations warranted by these results. Joel Jefferson will receive a summary of his genetic counseling visit and a copy of his results once available. This information will also be available in Epic. We encouraged Joel Jefferson to remain in contact with cancer genetics annually so that we can continuously update the family history and inform him of any changes in cancer genetics and testing that may be of benefit for his family. Joel Jefferson questions were answered to his satisfaction today. Our contact information was provided should additional questions or concerns arise.  Lastly, we encouraged Joel Jefferson to remain in contact with cancer genetics annually so that we can continuously update the family history and inform him of any changes in cancer genetics and testing that may be of benefit for this family.   Mr.  Jefferson questions were answered to his satisfaction today. Our contact information was provided should additional questions or concerns arise. Thank you for the referral and allowing [place] to share in the care of your patient.   Katelan Hirt P. Florene Glen, [place], [facility] Certified Genetic Counselor [email or web address] phone: [phone]  The patient was seen for a total of 55 minutes in face-to-face genetic counseling.  This patient was discussed with Drs. Magrinat, Lindi Adie and/or Burr Medico who agrees with the above.    _______________________________________________________________________ For Office Staff:  Number of people involved in session: 1 Was an Intern/ student involved with case: no

## 2018-03-30 ENCOUNTER — Encounter (HOSPITAL_COMMUNITY)
Admission: RE | Admit: 2018-03-30 | Discharge: 2018-03-30 | Disposition: A | Discharge status: Home / Self Care | Payer: BLUE CROSS/BLUE SHIELD | Source: Ambulatory Visit | Attending: Nurse Practitioner | Admitting: Nurse Practitioner

## 2018-03-30 DIAGNOSIS — E669 Obesity, unspecified: Secondary | ICD-10-CM | POA: Diagnosis not present

## 2018-03-30 DIAGNOSIS — R51 Headache: Secondary | ICD-10-CM | POA: Diagnosis not present

## 2018-03-30 DIAGNOSIS — R599 Enlarged lymph nodes, unspecified: Secondary | ICD-10-CM | POA: Diagnosis not present

## 2018-03-30 DIAGNOSIS — I10 Essential (primary) hypertension: Secondary | ICD-10-CM | POA: Diagnosis not present

## 2018-03-30 DIAGNOSIS — Z0001 Encounter for general adult medical examination with abnormal findings: Secondary | ICD-10-CM | POA: Diagnosis not present

## 2018-03-30 DIAGNOSIS — Z1389 Encounter for screening for other disorder: Secondary | ICD-10-CM | POA: Diagnosis not present

## 2018-03-30 DIAGNOSIS — R7309 Other abnormal glucose: Secondary | ICD-10-CM | POA: Diagnosis not present

## 2018-03-30 DIAGNOSIS — N200 Calculus of kidney: Secondary | ICD-10-CM | POA: Diagnosis not present

## 2018-03-30 DIAGNOSIS — C187 Malignant neoplasm of sigmoid colon: Secondary | ICD-10-CM

## 2018-03-30 DIAGNOSIS — E782 Mixed hyperlipidemia: Secondary | ICD-10-CM | POA: Diagnosis not present

## 2018-03-30 DIAGNOSIS — E6609 Other obesity due to excess calories: Secondary | ICD-10-CM | POA: Diagnosis not present

## 2018-03-30 MED ORDER — FLUDEOXYGLUCOSE F - 18 (FDG) INJECTION
14.4000 | Freq: Once | INTRAVENOUS | Status: AC | PRN
  Administered 2018-03-30: 14.4 via INTRAVENOUS

## 2018-04-02 ENCOUNTER — Telehealth: Payer: Self-pay | Admitting: Genetic Counselor

## 2018-04-02 ENCOUNTER — Encounter: Payer: Self-pay | Admitting: Genetic Counselor

## 2018-04-02 ENCOUNTER — Ambulatory Visit: Payer: Self-pay | Admitting: Genetic Counselor

## 2018-04-02 DIAGNOSIS — Z1379 Encounter for other screening for genetic and chromosomal anomalies: Secondary | ICD-10-CM

## 2018-04-02 NOTE — Telephone Encounter (Signed)
Revealed negative genetic testing.  Discussed that we do not know why he has colon cancer or why there is cancer in the family. It could be due to a different gene that we are not testing, or maybe our current technology may not be able to pick something up.  It will be important for him to keep in contact with genetics to keep up with whether additional testing may be needed.   Revealed one VUS was identified.  This will not change medical management.

## 2018-04-02 NOTE — Progress Notes (Signed)
HPI:  Joel Jefferson was previously seen in the [facility] due to a personal and family history of cancer and concerns regarding a hereditary predisposition to cancer. Please refer to our prior cancer genetics clinic note for more information regarding Joel Jefferson medical, social and family histories, and our assessment and recommendations, at the time. Joel Jefferson recent genetic test results were disclosed to him, as were recommendations warranted by these results. These results and recommendations are discussed in more detail below.  CANCER HISTORY:    Cancer of sigmoid colon [facility])    Initial Diagnosis    Cancer of sigmoid colon ([place])    03/27/2018 Genetic Testing    ALK c.350C>G VUS identified on the multicancer panel.  The Multi-Gene Panel offered by [facility] includes sequencing and/or deletion duplication testing of the following 85 genes: AIP, ALK, APC, ATM, AXIN2,BAP1,  BARD1, BLM, BMPR1A, BRCA1, BRCA2, BRIP1, CASR, CDC73, CDH1, CDK4, CDKN1B, CDKN1C, CDKN2A (p14ARF), CDKN2A (p16INK4a), CEBPA, CHEK2, CTNNA1, DICER1, DIS3L2, EGFR (c.2369C>T, p.Thr790Met variant only), EPCAM (Deletion/duplication testing only), FH, FLCN, GATA2, GPC3, GREM1 (Promoter region deletion/duplication testing only), HOXB13 (c.251G>A, p.Gly84Glu), HRAS, KIT, MAX, MEN1, MET, MITF (c.952G>A, p.Glu318Lys variant only), MLH1, MSH2, MSH3, MSH6, MUTYH, NBN, NF1, NF2, NTHL1, PALB2, PDGFRA, PHOX2B, PMS2, POLD1, POLE, POT1, PRKAR1A, PTCH1, PTEN, RAD50, RAD51C, RAD51D, RB1, RECQL4, RET, RNF43, RUNX1, SDHAF2, SDHA (sequence changes only), SDHB, SDHC, SDHD, SMAD4, SMARCA4, SMARCB1, SMARCE1, STK11, SUFU, TERC, TERT, TMEM127, TP53, TSC1, TSC2, VHL, WRN and WT1.   The report date is 03/27/2018.     FAMILY HISTORY:  We obtained a detailed, 4-generation family history.  Significant diagnoses are listed below: Family History  Problem Relation Age of Onset  . Dementia Mother   . Dementia Maternal Aunt   . Dementia  Maternal Grandmother   . COPD Maternal Grandfather   . Breast cancer Maternal Aunt 42  . Breast cancer Maternal Aunt        dx in her 27s  . Lung cancer Maternal Aunt   . Healthy Son   . Colon cancer Neg Hx     The patient has one son and one sister who are cancer free.  Both parents are living.  The patient's mother was diagnosed with dementia 4 years ago.  She has four sisters and three brothers.  Two sisters had breast cancer, one at age 62. Another sister has dementia. This sister has a daughter with skin cancer. The maternal grandparents are cancer free, but the grandmother died from complications of dementia.  The patient's father is cancer free.  He has two sisters who are cancer free. And his parents died of non cancer related issues.  Joel Jefferson is unaware of previous family history of genetic testing for hereditary cancer risks. Patient's maternal ancestors are of [place] descent, and paternal ancestors are of [place] descent. There is no reported Ashkenazi Jewish ancestry. There is no known consanguinity.  GENETIC TEST RESULTS: Genetic testing reported out on March 27, 2018 through the multi-cancer panel found no deleterious mutations. The Multi-Gene Panel offered by [facility] includes sequencing and/or deletion duplication testing of the following 84 genes: AIP, ALK, APC, ATM, AXIN2,BAP1,  BARD1, BLM, BMPR1A, BRCA1, BRCA2, BRIP1, CASR, CDC73, CDH1, CDK4, CDKN1B, CDKN1C, CDKN2A (p14ARF), CDKN2A (p16INK4a), CEBPA, CHEK2, CTNNA1, DICER1, DIS3L2, EGFR (c.2369C>T, p.Thr790Met variant only), EPCAM (Deletion/duplication testing only), FH, FLCN, GATA2, GPC3, GREM1 (Promoter region deletion/duplication testing only), HOXB13 (c.251G>A, p.Gly84Glu), HRAS, KIT, MAX, MEN1, MET, MITF (c.952G>A, p.Glu318Lys variant only), MLH1, MSH2, MSH3, MSH6, MUTYH,  NBN, NF1, NF2, NTHL1, PALB2, PDGFRA, PHOX2B, PMS2, POLD1, POLE, POT1, PRKAR1A, PTCH1, PTEN, RAD50, RAD51C, RAD51D, RB1, RECQL4, RET, RUNX1, SDHAF2,  SDHA (sequence changes only), SDHB, SDHC, SDHD, SMAD4, SMARCA4, SMARCB1, SMARCE1, STK11, SUFU, TERC, TERT, TMEM127, TP53, TSC1, TSC2, VHL, WRN and WT1.   The test report has been scanned into EPIC and is located under the Molecular Pathology section of the Results Review tab.    We discussed with Joel Jefferson that since the current genetic testing is not perfect, it is possible there may be a gene mutation in one of these genes that current testing cannot detect, but that chance is small.  We also discussed, that it is possible that another gene that has not yet been discovered, or that we have not yet tested, is responsible for the cancer diagnoses in the family, and it is, therefore, important to remain in touch with cancer genetics in the future so that we can continue to offer Joel Jefferson the most up to date genetic testing.   Genetic testing did detect a Variant of Unknown Significance in the ALK gene called c.350C>G. At this time, it is unknown if this variant is associated with increased cancer risk or if this is a normal finding, but most variants such as this get reclassified to being inconsequential. It should not be used to make medical management decisions. With time, we suspect the lab will determine the significance of this variant, if any. If we do learn more about it, we will try to contact Joel Jefferson to discuss it further. However, it is important to stay in touch with [place] periodically and keep the address and phone number up to date.     CANCER SCREENING RECOMMENDATIONS:  This result is reassuring and indicates that Joel Jefferson likely does not have an increased risk for a future cancer due to a mutation in one of these genes. This normal test also suggests that Joel Jefferson cancer was most likely not due to an inherited predisposition associated with one of these genes.  Most cancers happen by chance and this negative test suggests that his cancer falls into this category.  We, therefore,  recommended he continue to follow the cancer management and screening guidelines provided by his oncology and primary healthcare provider.   An individual's cancer risk and medical management are not determined by genetic test results alone. Overall cancer risk assessment incorporates additional factors, including personal medical history, family history, and any available genetic information that may result in a personalized plan for cancer prevention and surveillance.  This negative genetic test simply tells [place] that we cannot yet define why Joel Jefferson has had colorectal cancer at a young age. Joel Jefferson medical management and screening should be based on the prospect that he may be at an increased risk for a second colorectal cancer in the future and should, therefore, undergo more frequent colonoscopy screening at intervals determined by his GI providers.  We also recommended that Joel Jefferson have an upper endoscopy periodically.  RECOMMENDATIONS FOR FAMILY MEMBERS:  Individuals in this family might be at some increased risk of developing cancer, over the general population risk, simply due to the family history of cancer.  We recommended women in this family have a yearly mammogram beginning at age 61, or 75 years younger than the earliest onset of cancer, an annual clinical breast exam, and perform monthly breast self-exams. Women in this family should also have a gynecological exam as recommended by their primary provider. All  family members should have a colonoscopy by age 22, or 105 years younger than the earliest age of onset.  FOLLOW-UP: Lastly, we discussed with Joel Jefferson that cancer genetics is a rapidly advancing field and it is possible that new genetic tests will be appropriate for him and/or his family members in the future. We encouraged him to remain in contact with cancer genetics on an annual basis so we can update his personal and family histories and let him know of advances in cancer  genetics that may benefit this family.   Our contact number was provided. Joel Jefferson questions were answered to his satisfaction, and he knows he is welcome to call us at anytime with additional questions or concerns.   Roma Kayser, MS, [facility] Certified Genetic Counselor [email or web address]

## 2018-04-02 NOTE — Telephone Encounter (Signed)
Phone would ring and then go to a busy tone after 2-3 rings.

## 2018-04-06 ENCOUNTER — Encounter (HOSPITAL_COMMUNITY): Payer: Self-pay | Admitting: Hematology

## 2018-04-06 ENCOUNTER — Inpatient Hospital Stay (HOSPITAL_BASED_OUTPATIENT_CLINIC_OR_DEPARTMENT_OTHER): Payer: BLUE CROSS/BLUE SHIELD | Admitting: Hematology

## 2018-04-06 ENCOUNTER — Other Ambulatory Visit: Payer: Self-pay

## 2018-04-06 ENCOUNTER — Inpatient Hospital Stay (HOSPITAL_COMMUNITY): Payer: BLUE CROSS/BLUE SHIELD

## 2018-04-06 VITALS — BP 122/92 | HR 72 | Temp 98.5°F | Resp 18 | Wt 213.8 lb

## 2018-04-06 DIAGNOSIS — E7800 Pure hypercholesterolemia, unspecified: Secondary | ICD-10-CM | POA: Diagnosis not present

## 2018-04-06 DIAGNOSIS — K219 Gastro-esophageal reflux disease without esophagitis: Secondary | ICD-10-CM | POA: Diagnosis not present

## 2018-04-06 DIAGNOSIS — C187 Malignant neoplasm of sigmoid colon: Secondary | ICD-10-CM

## 2018-04-06 DIAGNOSIS — I10 Essential (primary) hypertension: Secondary | ICD-10-CM | POA: Diagnosis not present

## 2018-04-06 DIAGNOSIS — Z87891 Personal history of nicotine dependence: Secondary | ICD-10-CM | POA: Diagnosis not present

## 2018-04-06 DIAGNOSIS — Z803 Family history of malignant neoplasm of breast: Secondary | ICD-10-CM | POA: Diagnosis not present

## 2018-04-06 LAB — COMPREHENSIVE METABOLIC PANEL
ALBUMIN: 4.2 g/dL (ref 3.5–5.0)
ALT: 45 U/L — ABNORMAL HIGH (ref 0–44)
AST: 29 U/L (ref 15–41)
Alkaline Phosphatase: 49 U/L (ref 38–126)
Anion gap: 7 (ref 5–15)
BUN: 15 mg/dL (ref 6–20)
CHLORIDE: 113 mmol/L — AB (ref 98–111)
CO2: 20 mmol/L — AB (ref 22–32)
Calcium: 8.9 mg/dL (ref 8.9–10.3)
Creatinine, Ser: 0.86 mg/dL (ref 0.61–1.24)
GFR calc Af Amer: >60 mL/min (ref >60)
GFR calc non Af Amer: >60 mL/min (ref >60)
GLUCOSE: 125 mg/dL — AB (ref 70–99)
POTASSIUM: 4 mmol/L (ref 3.5–5.1)
SODIUM: 140 mmol/L (ref 135–145)
Total Bilirubin: 0.4 mg/dL (ref 0.3–1.2)
Total Protein: 7 g/dL (ref 6.5–8.1)

## 2018-04-06 LAB — CBC WITH DIFFERENTIAL/PLATELET
Abs Immature Granulocytes: 0.02 10*3/uL (ref 0.00–0.07)
BASOS ABS: 0 10*3/uL (ref 0.0–0.1)
Basophils Relative: 1 %
EOS ABS: 0.2 10*3/uL (ref 0.0–0.5)
Eosinophils Relative: 3 %
HEMATOCRIT: 42.2 % (ref 39.0–52.0)
Hemoglobin: 13.7 g/dL (ref 13.0–17.0)
IMMATURE GRANULOCYTES: 0 %
LYMPHS ABS: 1.8 10*3/uL (ref 0.7–4.0)
LYMPHS PCT: 27 %
MCH: 28.2 pg (ref 26.0–34.0)
MCHC: 32.5 g/dL (ref 30.0–36.0)
MCV: 86.8 fL (ref 80.0–100.0)
Monocytes Absolute: 0.3 10*3/uL (ref 0.1–1.0)
Monocytes Relative: 4 %
NEUTROS PCT: 65 %
NRBC: 0 % (ref 0.0–0.2)
Neutro Abs: 4.3 10*3/uL (ref 1.7–7.7)
Platelets: 203 10*3/uL (ref 150–400)
RBC: 4.86 MIL/uL (ref 4.22–5.81)
RDW: 14.5 % (ref 11.5–15.5)
WBC: 6.5 10*3/uL (ref 4.0–10.5)

## 2018-04-06 MED ORDER — ACETAMINOPHEN 325 MG PO TABS
ORAL_TABLET | ORAL | Status: AC
  Filled 2018-04-06: qty 2

## 2018-04-06 MED ORDER — DIPHENHYDRAMINE HCL 25 MG PO CAPS
ORAL_CAPSULE | ORAL | Status: AC
  Filled 2018-04-06: qty 1

## 2018-04-06 NOTE — Progress Notes (Signed)
START ON PATHWAY REGIMEN - Colorectal     A cycle is every 14 days:     Oxaliplatin      Leucovorin      5-Fluorouracil      5-Fluorouracil   **Always confirm dose/schedule in your pharmacy ordering system**  Patient Characteristics: Postoperative without Neoadjuvant Therapy (Pathologic Staging), Colon, Stage III, High Risk (pT4 or pN2) Therapeutic Status: Postoperative without Neoadjuvant Therapy (Pathologic Staging) Tumor Location: Colon AJCC M Category: cM0 AJCC T Category: pT3 AJCC N Category: pN2b AJCC 8 Stage Grouping: IIIC Intent of Therapy: Curative Intent, Discussed with Patient

## 2018-04-06 NOTE — Assessment & Plan Note (Signed)
1.  Stage III (pT3 pN2b) sigmoid colon adenocarcinoma, MSI-stable: - Patient had left lower quadrant abdominal pain since October, CT scan done at [facility] of the abdomen pelvis showed sigmoid colon thickening. -he underwent colonoscopy on 03/06/2018 showing a fungating infiltrative and polypoid partially obstructing large mass in the sigmoid colon between 20-25 cm from the anal verge.  This was consistent with colon cancer. - He underwent sigmoid colon segmental resection on 03/02/2018 by Dr. Arnoldo Morale.  This showed invasive adenocarcinoma, well-differentiated, 3.8 cm, tumor invades through muscularis propria, resection margins negative, 14/35 lymph nodes positive, satellite nodules x3, PT 3, PN2B, MMR preserved. - CT of the chest on 02/26/2018 shows 2 small pulmonary nodules, 7 x 5 mm subpleural nodule in the right major fissure and the 4 mm left lower lobe nodule.  Mild hepatic steatosis was seen. - We have reviewed pathology report in detail. - We reviewed the results of the PET CT scan dated 03/30/2018 which shows bilateral hypermetabolic pelvic sidewall lymph nodes, subcentimeter, not in the initial distribution pattern for sigmoid primary.  Lymph nodes are in the bilateral external iliac and right inguinal lymph node (7 mm).  They are most likely reactive.  A pelvic CT scan at 3 months was recommended.  Given his normal CEA level, I do not believe they are metastatic.  No hypermetabolic chest lesions. -We talked about 6 months of adjuvant FOLFOX therapy once every 2 weeks.  We talked about side effects in detail.  Patient would like to start working while starting his cycle 2. - We plan to start his adjuvant chemotherapy in the next 1 to 2 weeks.  2.  Family history: -2 maternal aunts had breast cancer.  No family history of colorectal cancer. - I have also recommended genetic testing because of his young age at diagnosis.

## 2018-04-06 NOTE — Progress Notes (Signed)
[address] [place], [place] [address]   CLINIC:  Medical Oncology/Hematology  PCP:  Sharilyn Sites, [address] [place] [address] [phone]   REASON FOR VISIT: Follow-up for stage III colon cancer  CURRENT THERAPY: Folfox every 2 weeks  BRIEF ONCOLOGIC HISTORY:    Cancer of sigmoid colon [facility])    Initial Diagnosis    Cancer of sigmoid colon ([place])    03/27/2018 Genetic Testing    ALK c.350C>G VUS identified on the multicancer panel.  The Multi-Gene Panel offered by [facility] includes sequencing and/or deletion duplication testing of the following 85 genes: AIP, ALK, APC, ATM, AXIN2,BAP1,  BARD1, BLM, BMPR1A, BRCA1, BRCA2, BRIP1, CASR, CDC73, CDH1, CDK4, CDKN1B, CDKN1C, CDKN2A (p14ARF), CDKN2A (p16INK4a), CEBPA, CHEK2, CTNNA1, DICER1, DIS3L2, EGFR (c.2369C>T, p.Thr790Met variant only), EPCAM (Deletion/duplication testing only), FH, FLCN, GATA2, GPC3, GREM1 (Promoter region deletion/duplication testing only), HOXB13 (c.251G>A, p.Gly84Glu), HRAS, KIT, MAX, MEN1, MET, MITF (c.952G>A, p.Glu318Lys variant only), MLH1, MSH2, MSH3, MSH6, MUTYH, NBN, NF1, NF2, NTHL1, PALB2, PDGFRA, PHOX2B, PMS2, POLD1, POLE, POT1, PRKAR1A, PTCH1, PTEN, RAD50, RAD51C, RAD51D, RB1, RECQL4, RET, RNF43, RUNX1, SDHAF2, SDHA (sequence changes only), SDHB, SDHC, SDHD, SMAD4, SMARCA4, SMARCB1, SMARCE1, STK11, SUFU, TERC, TERT, TMEM127, TP53, TSC1, TSC2, VHL, WRN and WT1.   The report date is 03/27/2018.    04/07/2018 -  Chemotherapy    The patient had PALONOSETRON HCL INJECTION 0.25 MG/5ML, 0.25 mg, Intravenous,  Once, 0 of 12 cycles leucovorin 876 mg in dextrose 5 % 250 mL infusion, 400 mg/m2, Intravenous,  Once, 0 of 12 cycles oxaliplatin (ELOXATIN) 185 mg in dextrose 5 % 500 mL chemo infusion, 85 mg/m2, Intravenous,  Once, 0 of 12 cycles fluorouracil (ADRUCIL) chemo injection 900 mg, 400 mg/m2, Intravenous,  Once, 0 of 12 cycles fluorouracil (ADRUCIL) 5,250 mg in sodium  chloride 0.9 % 145 mL chemo infusion, 2,400 mg/m2, Intravenous, 1 Day/Dose, 0 of 12 cycles  for chemotherapy treatment.       INTERVAL HISTORY:  [name] 42 y.o. male returns for routine follow-up for stage III colon cancer. He is here today with his wife. He is doing well since his surgery with Arnoldo Morale. The incision is healing well no redness, swelling or pain noted. He is feeling good and ready to begin his treatment. Denies any nausea, vomiting, or diarrhea. Denies any new pains. Had not noticed any recent bleeding such as epistaxis, hematuria or hematochezia. Denies recent chest pain on exertion, shortness of breath on minimal exertion, pre-syncopal episodes, or palpitations. Denies any numbness or tingling in hands or feet. Denies any recent fevers, infections, or recent hospitalizations. He reports his appetite and energy level at 75%. He has no problems maintaining his weight at this time.    REVIEW OF SYSTEMS:  Review of Systems  All other systems reviewed and are negative.    PAST MEDICAL/SURGICAL HISTORY:  Past Medical History:  Diagnosis Date  . Family history of breast cancer   . GERD (gastroesophageal reflux disease)   . History of kidney stones   . Hypercholesterolemia   . Hypertension   . Urethral stricture    Past Surgical History:  Procedure Laterality Date  . BALLOON DILATION  04/06/2012   Procedure: BALLOON DILATION;  Surgeon: Reece Packer, MD;  Location: [facility];  Service: Urology;  Laterality: N/A;  . BIOPSY  02/24/2018   Procedure: BIOPSY;  Surgeon: Danie Binder, MD;  [facility];  Service: Endoscopy;;  colon  .  COLONOSCOPY WITH PROPOFOL N/A 02/24/2018   Procedure: COLONOSCOPY WITH PROPOFOL;  Surgeon: Danie Binder, MD;  Location: [facility];  Service: Endoscopy;  Laterality: N/A;  9:30am  . ESOPHAGOGASTRODUODENOSCOPY (EGD) WITH PROPOFOL N/A 08/29/2015   Dr. Oneida Alar: possible proximal esophageal web s/p dilation,  moderate gastritis, negative eosinophilic esophagitis   . PARTIAL COLECTOMY N/A 03/02/2018   Procedure: PARTIAL COLECTOMY;  Surgeon: Aviva Signs, MD;  Location: [facility];  Service: General;  Laterality: N/A;  . SAVORY DILATION N/A 08/29/2015   Procedure: SAVORY DILATION;  Surgeon: Danie Binder, MD;  Location[facility];  Service: Endoscopy;  Laterality: N/A;  . URETHRAL DILATION    . WISDOM TOOTH EXTRACTION       SOCIAL HISTORY:  Social History   Socioeconomic History  . Marital status: Divorced    Spouse name: Not on file  . Number of children: Not on file  . Years of education: Not on file  . Highest education level: Not on file  Occupational History  . Not on file  Social Needs  . Financial resource strain: Not on file  . Food insecurity:    Worry: Not on file    Inability: Not on file  . Transportation needs:    Medical: Not on file    Non-medical: Not on file  Tobacco Use  . Smoking status: Former Smoker    Packs/day: 0.25    Years: 9.00    Pack years: 2.25    Types: Cigarettes    Last attempt to quit: 07/27/2011    Years since quitting: 6.6  . Smokeless tobacco: Never Used  Substance and Sexual Activity  . Alcohol use: Yes    Alcohol/week: 6.0 standard drinks    Types: 6 Cans of beer per week  . Drug use: No  . Sexual activity: Yes    Birth control/protection: None  Lifestyle  . Physical activity:    Days per week: Not on file    Minutes per session: Not on file  . Stress: Not on file  Relationships  . Social connections:    Talks on phone: Not on file    Gets together: Not on file    Attends religious service: Not on file    Active member of club or organization: Not on file    Attends meetings of clubs or organizations: Not on file    Relationship status: Not on file  . Intimate partner violence:    Fear of current or ex partner: Not on file    Emotionally abused: Not on file    Physically abused: Not on file    Forced sexual activity: Not on  file  Other Topics Concern  . Not on file  Social History Narrative  . Not on file    FAMILY HISTORY:  Family History  Problem Relation Age of Onset  . Dementia Mother   . Dementia Maternal Aunt   . Dementia Maternal Grandmother   . COPD Maternal Grandfather   . Breast cancer Maternal Aunt 42  . Breast cancer Maternal Aunt        dx in her 35s  . Lung cancer Maternal Aunt   . Healthy Son   . Colon cancer Neg Hx     CURRENT MEDICATIONS:  Outpatient Encounter Medications as of 04/06/2018  Medication Sig Note  . esomeprazole (NEXIUM) 40 MG capsule Take 40 mg by mouth daily.    Marland Kitchen lisinopril (PRINIVIL,ZESTRIL) 10 MG tablet Take 10 mg by mouth daily.    Marland Kitchen  Magnesium 500 MG CAPS Take 500 mg by mouth daily.    . Potassium (POTASSIMIN PO) Take 1 tablet by mouth daily.    . pravastatin (PRAVACHOL) 20 MG tablet Take 20 mg by mouth daily.    Marland Kitchen topiramate (TOPAMAX) 100 MG tablet Take 100 mg by mouth 2 (two) times daily.    . ondansetron (ZOFRAN) 4 MG tablet Take 1 tablet (4 mg total) by mouth every 8 (eight) hours as needed for nausea or vomiting. 04/06/2018: Not started yet  . tamsulosin (FLOMAX) 0.4 MG CAPS capsule Take 1 capsule (0.4 mg total) by mouth daily after breakfast. (Patient not taking: Reported on 04/06/2018)   . traMADol (ULTRAM) 50 MG tablet Take 1 tablet (50 mg total) by mouth every 6 (six) hours as needed. (Patient taking differently: Take 50 mg by mouth every 6 (six) hours as needed for moderate pain. )   . [DISCONTINUED] linaclotide (LINZESS) 145 MCG CAPS capsule Take 1 capsule (145 mcg total) by mouth daily before breakfast. (Patient taking differently: Take 145 mcg by mouth daily as needed (for constipation). )   . [DISCONTINUED] omeprazole (PRILOSEC) 20 MG capsule Take 20 mg by mouth 2 (two) times daily.   . [DISCONTINUED] oxyCODONE-acetaminophen (PERCOCET) 7.5-325 MG tablet Take 1 tablet by mouth every 6 (six) hours as needed for severe pain.    No facility-administered  encounter medications on file as of 04/06/2018.     ALLERGIES:  No Known Allergies   PHYSICAL EXAM:  ECOG Performance status: 1  Vitals:   04/06/18 1011  BP: (!) 122/92  Pulse: 72  Resp: 18  Temp: 98.5 F (36.9 C)  SpO2: 100%   Filed Weights   04/06/18 1011  Weight: 213 lb 12.8 oz (97 kg)    Physical Exam Constitutional:      Appearance: Normal appearance. He is normal weight.  Cardiovascular:     Rate and Rhythm: Normal rate and regular rhythm.     Heart sounds: Normal heart sounds.  Pulmonary:     Effort: Pulmonary effort is normal.     Breath sounds: Normal breath sounds.  Musculoskeletal: Normal range of motion.  Skin:    General: Skin is warm and dry.     Comments: Incision healing well, no redness, swelling, or pain noted  Neurological:     Mental Status: He is alert and oriented to person, place, and time. Mental status is at baseline.  Psychiatric:        Mood and Affect: Mood normal.        Behavior: Behavior normal.        Thought Content: Thought content normal.        Judgment: Judgment normal.      LABORATORY DATA:  I have reviewed the labs as listed.  CBC    Component Value Date/Time   WBC 6.5 04/06/2018 1217   RBC 4.86 04/06/2018 1217   HGB 13.7 04/06/2018 1217   HCT 42.2 04/06/2018 1217   PLT 203 04/06/2018 1217   MCV 86.8 04/06/2018 1217   MCH 28.2 04/06/2018 1217   MCHC 32.5 04/06/2018 1217   RDW 14.5 04/06/2018 1217   LYMPHSABS 1.8 04/06/2018 1217   MONOABS 0.3 04/06/2018 1217   EOSABS 0.2 04/06/2018 1217   BASOSABS 0.0 04/06/2018 1217   CMP Latest Ref Rng & Units 04/06/2018 03/05/2018 03/04/2018  Glucose 70 - 99 mg/dL 125(H) 119(H) 120(H)  BUN 6 - 20 mg/dL _0 Creatinine 0.61 - 1.24 mg/dL 0.86 1.77(H) 1.85(H)  Sodium 135 - 145 mmol/L 140 139 137  Potassium 3.5 - 5.1 mmol/L 4.0 3.8 3.9  Chloride 98 - 111 mmol/L 113(H) 112(H) 112(H)  CO2 22 - 32 mmol/L 20(L) 21(L) 21(L)  Calcium 8.9 - 10.3 mg/dL 8.9 8.2(L) 8.0(L)   Total Protein 6.5 - 8.1 g/dL 7.0 - -  Total Bilirubin 0.3 - 1.2 mg/dL 0.4 - -  Alkaline Phos 38 - 126 U/L 49 - -  AST 15 - 41 U/L 29 - -  ALT 0 - 44 U/L 45(H) - -       DIAGNOSTIC IMAGING:  I have independently reviewed the scans and discussed with the patient.   I have reviewed Francene Finders, NP's note and agree with the documentation.  I personally performed a face-to-face visit, made revisions and my assessment and plan is as follows.    ASSESSMENT & PLAN:   Cancer of sigmoid colon ([place]) 1.  Stage III (pT3 pN2b) sigmoid colon adenocarcinoma, MSI-stable: - Patient had left lower quadrant abdominal pain since October, CT scan done at [facility] of the abdomen pelvis showed sigmoid colon thickening. -he underwent colonoscopy on 03/06/2018 showing a fungating infiltrative and polypoid partially obstructing large mass in the sigmoid colon between 20-25 cm from the anal verge.  This was consistent with colon cancer. - He underwent sigmoid colon segmental resection on 03/02/2018 by Dr. Arnoldo Morale.  This showed invasive adenocarcinoma, well-differentiated, 3.8 cm, tumor invades through muscularis propria, resection margins negative, 14/35 lymph nodes positive, satellite nodules x3, PT 3, PN2B, MMR preserved. - CT of the chest on 02/26/2018 shows 2 small pulmonary nodules, 7 x 5 mm subpleural nodule in the right major fissure and the 4 mm left lower lobe nodule.  Mild hepatic steatosis was seen. - We have reviewed pathology report in detail. - We reviewed the results of the PET CT scan dated 03/30/2018 which shows bilateral hypermetabolic pelvic sidewall lymph nodes, subcentimeter, not in the initial distribution pattern for sigmoid primary.  Lymph nodes are in the bilateral external iliac and right inguinal lymph node (7 mm).  They are most likely reactive.  A pelvic CT scan at 3 months was recommended.  Given his normal CEA level, I do not believe they are metastatic.  No hypermetabolic chest  lesions. -We talked about 6 months of adjuvant FOLFOX therapy once every 2 weeks.  We talked about side effects in detail.  Patient would like to start working while starting his cycle 2. - We plan to start his adjuvant chemotherapy in the next 1 to 2 weeks.  2.  Family history: -2 maternal aunts had breast cancer.  No family history of colorectal cancer. - I have also recommended genetic testing because of his young age at diagnosis.      Orders placed this encounter:  Orders Placed This Encounter  Procedures  . CEA  . CBC with Differential/Platelet  . Comprehensive metabolic panel  . CBC with Differential/Platelet  . Comprehensive metabolic panel      Derek Jack, MD [place] [phone]

## 2018-04-06 NOTE — Progress Notes (Signed)
Oncology Navigator Note: I met with patient after office visit today. I went over his chemotherapy regimen.  I discussed with him the side effects and how to treat.  I discussed what to expect on chemotherapy treatment days. I talked about how to reach our clinic as well as myself during normal operating hours.  The patient and his wife were given time to ask questions and all were answered to their satisfaction.  Patient is aware of his appointments and knows that he will be receiving additional teaching material on his first day of treatment.  Patient was given the opportunity to view our treatment area.

## 2018-04-06 NOTE — Patient Instructions (Signed)
[facility] at [facility] Discharge Instructions     Thank you for choosing [facility] at [facility] to provide your oncology and hematology care.  To afford each patient quality time with our provider, please arrive at least 15 minutes before your scheduled appointment time.   If you have a lab appointment with the Cancer Center please come in thru the  Main Entrance and check in at the main information desk  You need to re-schedule your appointment should you arrive 10 or more minutes late.  We strive to give you quality time with our providers, and arriving late affects you and other patients whose appointments are after yours.  Also, if you no show three or more times for appointments you may be dismissed from the clinic at the providers discretion.     Again, thank you for choosing [facility].  Our hope is that these requests will decrease the amount of time that you wait before being seen by our physicians.       _____________________________________________________________  Should you have questions after your visit to  Cancer Center, please contact our office at [phone] between the hours of 8:00 a.m. and 4:30 p.m.  Voicemails left after 4:00 p.m. will not be returned until the following business day.  For prescription refill requests, have your pharmacy contact our office and allow 72 hours.    Cancer Center Support Programs:   > Cancer Support Group  2nd Tuesday of the month 1pm-2pm, Journey Room    

## 2018-04-07 ENCOUNTER — Encounter (HOSPITAL_COMMUNITY): Payer: Self-pay | Admitting: *Deleted

## 2018-04-07 LAB — CEA: CEA: 1.5 ng/mL (ref 0.0–4.7)

## 2018-04-07 MED ORDER — PROCHLORPERAZINE MALEATE 10 MG PO TABS: 10.0000 mg | ORAL_TABLET | Freq: Four times a day (QID) | ORAL | 1 refills | Status: DC | PRN

## 2018-04-07 MED ORDER — LIDOCAINE-PRILOCAINE 2.5-2.5 % EX CREA: TOPICAL_CREAM | CUTANEOUS | 3 refills | Status: DC

## 2018-04-07 NOTE — Patient Instructions (Signed)
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 We are going to be treating you with curative intent.  We will be giving you chemotherapy every 14 days through your port a cath. You will be receiving Eloxatin (oxaliplatin), 5-FU/Adrucil (Fluorouracil) and Leucovorin.  You will go home with an infusion pump for 46 hours.  You will return to clinic to have pump removed on the 2nd day after treatment.  You will see the doctor regularly throughout treatment.  We monitor your lab work prior to every treatment. The doctor monitors your response to treatment by the way you are feeling, your blood work, and scans periodically.  There will be wait times while you are here for treatment.  It will take about 30 minutes to 1 hour for your lab work to result.  Then there will be wait times while pharmacy mixes your medications.   You will have pre-medications that you receive prior to each chemotherapy:  Aloxi - high powered nausea/vomiting prevention medication used for chemotherapy patients.   Dexamethasone - steroid - given to reduce the risk of you having an allergic type reaction to the chemotherapy. Dexamethasone can cause you to feel energized, nervous/anxious/jittery, make you have trouble sleeping, and/or make you feel hot/flushed in the face/neck and/or look pink/red in the face/neck. These side effects will pass as the medication wears off.   POTENTIAL SIDE EFFECTS OF TREATMENT: 5-Fluorouracil (Adrucil)  About This Drug Fluorouracil is used to treat cancer. It is given in the vein (IV).  This is the drug that will go home with you in your ambulatory pump.   Possible Side Effects . Hair loss. Hair loss is often temporary, although with certain medicine, hair loss can sometimes be permanent. Hair loss may happen suddenly or gradually. If you lose hair, you may lose it from your head, face, armpits, pubic area, chest, and/or legs.  You may also notice your hair getting thin. . Changes in your nail color, nail loss and/or brittle nail . Darkening of the skin, or changes to the color of your skin and/or veins used for infusion . Rash, itching . Nausea and throwing up (vomiting) . Loose bowel movements (diarrhea) . Ulcers - sores that may cause pain or bleeding in your digestive tract, which includes your mouth, esophagus, stomach, small/large intestines and rectum . Soreness of the mouth and throat. You may have red areas, white patches, or sores that hurt. . Decreased appetite (decreased hunger) . Changes in the tissue of the heart and/or heart attack. Some changes may happen that can cause your heart to have less ability to pump blood. . Bone marrow depression. This is a decrease in the number of white blood cells, red blood cells, and platelets. This may raise your risk of infection, make you tired and weak (fatigue), and raise your risk of bleeding . Sensitivity to light (photosensitivity). Photosensitivity means that you may become more sensitive to the sun and/or light. Your eyes may water more, mostly in bright light. . Allergic reaction: Allergic reactions, including anaphylaxis are rare but may happen in some patients. Signs of allergic reaction to this drug may be swelling of the face, feeling like your tongue or throat are swelling, trouble breathing, rash, itching, fever, chills, feeling dizzy, and/or feeling that your heart is beating in a fast or not normal way. If this happens, do not take another dose of this drug. You should get urgent medical treatment. . Blurred  vision or other changes in eyesight Note: Not all possible side effects are included above.  Warnings and Precautions . Hand-and-foot syndrome. The palms of your hands or soles of your feet may tingle, become numb, painful, swollen, or red. . Changes in your central nervous system can happen. The central nervous system is made up of your brain and  spinal cord. You could feel extreme tiredness, agitation, confusion, hallucinations (see or hear things that are not there), trouble understanding or speaking, loss of control of your bowels or bladder, eyesight changes, numbness or lack of strength to your arms, legs, face, or body, and coma. If you start to have any of these symptoms let your doctor know right away. . Side effects of this drug may be unexpectedly severe in some patients Note: Some of the side effects above are very rare. If you have concerns and/or questions, please discuss them with your medical team.  Important Information . This drug may be present in the saliva, tears, sweat, urine, stool, vomit, semen, and vaginal secretions. Talk to your doctor and/or your nurse about the necessary precautions to take during this time.  Treating Side Effects . To help with hair loss, wash with a mild shampoo and avoid washing your hair every day. . Avoid rubbing your scalp, pat your hair or scalp dry. . Avoid coloring your hair. . Limit your use of hair spray, electric curlers, blow dryers, and curling irons. . If you are interested in getting a wig, talk to your nurse. You can also call the [facility] at 800-ACS-2345 to find out information about the "Look Good, Feel Better" program close to where you live. It is a free program where women getting chemotherapy can learn about wigs, turbans and scarves as well as makeup techniques and skin and nail care. Marland Kitchen Keeping your nails moisturized may help with brittleness. . To help with itching, moisturize your skin several times day. . Avoid sun exposure and apply sunscreen routinely when outdoors. . If you get a rash do not put anything on it unless your doctor or nurse says you may. Keep the area around the rash clean and dry. Ask your doctor for medicine if your rash bothers you. . To help with decreased appetite, eat small, frequent meals. . Eat high caloric food such as pudding,  ice cream, yogurt and milkshakes. . Drink plenty of fluids (a minimum of eight glasses per day is recommended). . If you throw up or have loose bowel movements, you should drink more fluids so that you do not become dehydrated (lack water in the body from losing too much fluid). . To help with nausea and vomiting, eat small, frequent meals instead of three large meals a day. Choose foods and drinks that are at room temperature. Ask your nurse or doctor about other helpful tips and medicine that is available to help or stop lessen these symptoms. . If you get diarrhea, eat low-fiber foods that are high in protein and calories and avoid foods that can irritate your digestive tracts or lead to cramping. . Ask your nurse or doctor about medicine that can lessen or stop your diarrhea. . Mouth care is very important. Your mouth care should consist of routine, gentle cleaning of your teeth or dentures and rinsing your mouth with a mixture of 1/2 teaspoon of salt in 8 ounces of water or  teaspoon of baking soda in 8 ounces of water. This should be done at least after each meal and  at bedtime. . If you have mouth sores, avoid mouthwash that has alcohol. Also avoid alcohol and smoking because they can bother your mouth and throat. . Manage tiredness by pacing your activities for the day. . Be sure to include periods of rest between energy-draining activities. . To help decrease your risk of infections, wash your hands regularly. . Avoid close contact with people who have a cold, the flu, or other infections. . Use a soft toothbrush. Check with your nurse before using dental floss. . Be very careful when using knives or tools. . Use an electric shaver instead of a razor.  Food and Drug Interactions . There are no known interactions of fluorouracil with food. . Check with your doctor or pharmacist about all other prescription medicines and dietary supplements you are taking before starting this medicine as  there are a lot of known drug interactions with fluorouracil. Also, check with your doctor or pharmacist before starting any new prescription or over-the-counter medicines, or dietary supplement to make sure that there are no interactions.  When to Call the Doctor Call your doctor or nurse if you have any of these symptoms and/or any new or unusual symptoms: . Fever of 100.5 F (38 C) or higher . Chills . Easy bleeding or bruising . Trouble breathing . Feeling dizzy or lightheaded . Feeling that your heart is beating in a fast or not normal way (palpitations) . Chest pain or symptoms of a heart attack. Most heart attacks involve pain in the center of the chest that lasts more than a few minutes. The pain may go away and come back or it can be constant. It can feel like pressure, squeezing, fullness, or pain. Sometimes pain is felt in one or both arms, the back, neck, jaw, or stomach. If any of these symptoms last 2 minutes, call 911. Marland Kitchen Confusion and/or agitation . Hallucinations . Trouble understanding or speaking . Blurry vision or changes in your eyesight . Numbness or lack of strength to your arms, legs, face, or body . Nausea that stops you from eating or drinking and/or is not relieved by prescribed medicines . Throwing up more than 3 times a day . Loose bowel movements (diarrhea) 4 times a day or loose bowel movements with lack of strength or a feeling of being dizzy . Lasting loss of appetite or rapid weight loss of five pounds in a week . Pain in your mouth or throat that makes it hard to eat or drink . Pain along the digestive tract - especially if worse after eating . Blood in your vomit (bright red or coffee-ground) and/or stools (bright red, or black/tarry) . Coughing up blood . Fatigue that interferes with your daily activities . Painful, red, or swollen areas on your hands or feet . Numbness and/or tingling of your hands and/or feet . Signs of allergic reaction: swelling of  the face, feeling like your tongue or throat are swelling, trouble breathing, rash, itching, fever, chills, feeling dizzy, and/or feeling that your heart is beating in a fast or not normal way . If you think you are pregnant or may have impregnated your partner  Reproduction Warnings . Pregnancy warning: This drug may have harmful effects on the unborn baby. Women of child bearing potential should use effective methods of birth control during your cancer treatment. Let your doctor know right away if you think you may be pregnant. . Breastfeeding warning: It is not known if this drug passes into breast milk. For this  reason, women should talk to their doctor about the risks and benefits of breast feeding during treatment with this drug because this drug may enter the breast milk and cause harm to a breast feeding baby. . Fertility warning: In men and women both, this drug may affect your ability to have children in the future. Talk with your doctor or nurse if you plan to have children. Ask for information on sperm or egg banking.  Leucovorin Calcium (Generic Name) Other Names: Leucovorin, Wellcovorin, Folinic Acid, Citrovorum Factor  About This Drug Leucovorin is a vitamin. It is used in combination with other cancer fighting drugs such as 5-fluorouracil and methotrexate. Leucovorin helps 5-fluorouracil kill the cancer cells. It also helps to reduce the side effects of methotrexate. Leucovorin is given in the vein (IV) or by mouth (orally).  Will take 2 hours to infuse.  Possible Side Effects . Rash . Wheezing Leucovorin by itself has very few side effects. Side effects you may have can be caused by the other drugs you are taking, such as 5-fluorouracil or methotrexate.  Allergic Reactions . Allergic reactions to this drug are rare. While you are getting this drug by in your vein (IV), please tell your nurse right away if you have any of these symptoms of an allergic reaction: . Trouble  catching your breath . Feeling like your tongue or throat are swelling . Feeling your heart beat quickly or in a not normal way (palpitations) . Feeling dizzy or lightheaded . Flushing, itching, rash, and/or hives . If you are taking the oral form of leucovorin and you have these symptoms, do not take another dose of this drug and get urgent medical treatment.  Treating Side Effects If you get a rash do not put anything on it unless your doctor or nurse says you may. Keep the area around the rash clean and dry. Ask your doctor for medicine if your rash bothers you.  Food and Drug Interactions There are no known interactions of leucovorin with food. This drug may interact with other medicines. Tell your doctor and pharmacist about all the medicines and dietary supplements (vitamins, minerals, herbs and others) that you are taking at this time. The safety and use of dietary supplements and alternative diets are often not known. Using these might affect your cancer or interfere with your treatment. Until more is known, you should not use dietary supplements or alternative diets without your cancer doctor's help.  When to Call the Doctor . Call your doctor or nurse right away if you have any of these symptoms: . Wheezing or trouble breathing . Rash with or without itching, redness, or hives . If you take leucovorin by mouth, please also call your doctor right away if you have: Marland Kitchen Throwing up (vomiting) . Loose bowel movements (diarrhea)  Reproduction Concerns . Pregnancy warning: It is not known if this drug may harm an unborn child. For this reason, be sure to talk with your doctor if you are pregnant or planning to become pregnant while getting this drug. . Genetic counseling is available for you to talk about the effects of this drug therapy on future pregnancies. Also, a genetic counselor can look at the possible risk of problems in the unborn baby due to this medicine if an exposure happens  during pregnancy. . Breast feeding warning: It is not known if this drug passes into breast milk. For this reason, women should talk to their doctor about the risks and benefits of breast feeding during  treatment with this drug because this drug may enter the breast milk and badly harm a breast feeding baby.   Oxaliplatin (Generic Name) Other Names: EloxatinTM  About This Drug Oxaliplatin is used to treat cancer. It is given in the vein (IV).  Will take 2 hours to infuse and will infuse with the leucovorin.       Possible Side Effects (More Common) . Effects on the nerves are called peripheral neuropathy. You may feel numbness, tingling, or pain in your hands and feet. Cold temperatures can make it worse. Avoid cold drinks with ice. Dress warmly and cover the skin if you go out in the cold. Wear socks or gloves if you have to touch cold objects like cold flooring or items in the refrigerator or freezer. . It may be hard for you to button your clothes, open jars, or walk as usual. The effect on the nerves may get worse with more doses of the drug. These effects get better in some people after the drug is stopped but it does not get better in all people . Bone marrow depression: This is a decrease in the number of white blood cells, red blood cells, and platelets. It may increase your risk for infection, make you tired and weak (fatigue), and raise your risk of bleeding. . Nausea and throwing up (vomiting). These symptoms may happen within a few hours after your treatment and may last up to 72 hours. Medicines are available to stop or lessen these side effects. . Electrolyte changes. Your blood will be checked for electrolyte changes as needed.  Possible Side Effects (Less Common) . Skin and tissue irritation: This may involve redness, pain, warmth, or swelling at the IV site. This happens if the drug leaks out of the vein and into nearby tissue. . Serious allergic reactions including  anaphylaxis are rare. While you are getting this drug in your vein (IV), tell your nurse right away if you have any of these symptoms of an allergic reaction: . Trouble catching your breath. . Feeling like your tongue or throat are swelling. . Feeling your heart beat quickly or in a not normal way (palpitations). . Feeling dizzy or lightheaded. . Flushing, itching, rash, or hives. . Changes in your liver function. Your doctor will check your liver function as needed.  Treating Side Effects . Do not drink cold drinks or use ice cubes in drinks. Drink fluids at room temperature or warmer. Drink through a straw. . Wear gloves to touch cold objects. Be aware that most metals are cold to the touch, especially in winter. Examples are your car door handle and your mail box latch. . Wear warm clothing in cold weather at all times. Cover your mouth and nose with a scarf or a pulldown cap (ski cap) to warm the air that goes to your lungs. . Ask your doctor or nurse about medicine to treat nausea, throwing up, headache, or loose bowel movements. . While you are getting this drug in your vein, tell your nurse right away if you have any pain, redness, or swelling at the site of the IV infusion. . Mouth care is very important. Your mouth care should consist of routine, gentle cleaning of your teeth or dentures and rinsing your mouth with a mixture of 1/2 teaspoon of salt in 8 ounces of water or  teaspoon of baking soda in 8 ounces of water. This should be done at least after each meal and at bedtime. . Drink 6-8 cups  of fluids each day unless your doctor has told you to limit your fluid intake due to some other health problem. A cup is 8 ounces of fluid. If you throw up or have loose bowel movements you should drink more fluids so that you do not become dehydrated (lack water in the body due to losing too much fluid).  Food and Drug Interactions There are no known interactions of oxaliplatin with food. This  drug may interact with other medicines. Tell your doctor and pharmacist about all the medicines and dietary supplements (vitamins, minerals, herbs and others) that you are taking at this time. The safety and use of dietary supplements and alternative diets are often not known. Using these might affect your cancer or interfere with your treatment. Until more is known, you should not use dietary supplements or alternative diets without your cancer doctor's help.  When to Call the Doctor Call your doctor or nurse right away if you have any of these symptoms: . Fever of 100.5 F (38 C) or above . Chills . Easy bruising or bleeding . Wheezing or trouble breathing . Rash or itching . Feeling dizzy or lightheaded . Feeling that your heart is beating in a fast or not normal way (palpitations) . Loose bowel movements (diarrhea) more than 4 times a day or diarrhea with weakness or feeling lightheaded . Blurred vision or other changes in eyesight . Pain when passing urine; blood in urine . Pain in your lower back or side . Feeling confused or agitated . Nausea that stops you from eating or drinking . Throwing up more than 3 times a day . Chest pain or symptoms of a heart attack. Most heart attacks involve pain in the center of the chest that lasts more than a few minutes. The pain may go away and come back. It can feel like pressure, squeezing, fullness, or pain. Sometimes pain is felt in one or both arms, the back, neck, jaw, or stomach. If any of these symptoms last 2 minutes, call 911. Marland Kitchen Symptoms of a stroke such as sudden numbness or weakness of your face, arm, or leg, mostly on one side of your body; sudden confusion, trouble speaking or understanding; sudden trouble seeing in one or both eyes; sudden trouble walking, feeling dizzy, loss of balance or coordination; or sudden, bad headache with no known cause. If you have any of these symptoms for 2 minutes, call 911. . Signs of liver problems: dark  urine, pale bowel movements, bad stomach pain, feeling very tired and weak, itching, or yellowing of the eyes or skin. Call your doctor or nurse as soon as possible if any of these symptoms happen: . Change in hearing, ringing in the ears . Decreased urine . Unusual thirst or passing urine often . Pain in your mouth or throat that makes it hard to eat or drink . Nausea that is not relieved by prescribed medicines . Rash that is not relieved by prescribed medicines . Heavy menstrual period that lasts longer than normal . Numbness, tingling, decreased feeling or weakness in fingers, toes, arms, or legs . Trouble walking or changes in the way you walk, feeling clumsy when buttoning clothes, opening jars, or other routine hand motions . Swelling of legs, ankles, or feet . Weight gain of 5 pounds in one week (fluid retention) . Lasting loss of appetite or rapid weight loss of five pounds in a week . Fatigue that interferes with your daily activities . Headache that does not go  away . Painful, red, or swollen areas on your hands or feet. . No bowel movement for 3 days or you feel uncomfortable . Extreme weakness that interferes with normal activities  Reproduction Concerns Infertility Warning: Sexual problems and reproduction concerns may happen. In both men and women, this drug may affect your ability to have children. This cannot be determined before your treatment. Talk with your doctor or nurse if you plan to have children. Ask for information on sperm or egg banking. In men, this drug may interfere with your ability to make sperm, but it should not change your ability to have sexual relations. In women, menstrual bleeding may become irregular or stop while you are getting this drug. Do not assume that you cannot become pregnant if you do not have a menstrual period. Women may go through signs of menopause (change of life) like vaginal dryness or itching. Vaginal lubricants can be used to lessen  vaginal dryness, itching, and pain during sexual relations. Genetic counseling is available for you to talk about the effects of this drug therapy on future pregnancies. Also, a genetic counselor can look at the possible risk of problems in the unborn baby due to this medicine if an exposure happens during pregnancy. Pregnancy warning: This drug may have harmful effects on the unborn child, so effective methods of birth control should be used during your cancer treatment. Breast feeding warning: It is not known if this drug passes into breast milk. For this reason, women should talk to their doctor about the risks and benefits of breast feeding during treatment with this drug because this drug may enter the breast milk and badly harm a breast feeding baby.  SELF CARE ACTIVITIES WHILE ON CHEMOTHERAPY:  Hydration Increase your fluid intake 48 hours prior to treatment and drink at least 8 to 12 cups (64 ounces) of water/decaffeinated beverages per day after treatment. You can still have your cup of coffee or soda but these beverages do not count as part of your 8 to 12 cups that you need to drink daily. No alcohol intake.  Medications Continue taking your normal prescription medication as prescribed.  If you start any new herbal or new supplements please let us know first to make sure it is safe.  Mouth Care Have teeth cleaned professionally before starting treatment. Keep dentures and partial plates clean. Use soft toothbrush and do not use mouthwashes that contain alcohol. Biotene is a good mouthwash that is available at most pharmacies or may be ordered by calling [phone]. Use warm salt water gargles (1 teaspoon salt per 1 quart warm water) before and after meals and at bedtime. Or you may rinse with 2 tablespoons of three-percent hydrogen peroxide mixed in eight ounces of water. If you are still having problems with your mouth or sores in your mouth please call the clinic. If you need dental  work, please let the doctor know before you go for your appointment so that we can coordinate the best possible time for you in regards to your chemo regimen. You need to also let your dentist know that you are actively taking chemo. We may need to do labs prior to your dental appointment.  Skin Care Always use sunscreen that has not expired and with SPF (Sun Protection Factor) of 50 or higher. Wear hats to protect your head from the sun. Remember to use sunscreen on your hands, ears, face, & feet.  Use good moisturizing lotions such as udder cream, eucerin, or even  Vaseline. Some chemotherapies can cause dry skin, color changes in your skin and nails.    . Avoid long, hot showers or baths. . Use gentle, fragrance-free soaps and laundry detergent. . Use moisturizers, preferably creams or ointments rather than lotions because the thicker consistency is better at preventing skin dehydration. Apply the cream or ointment within 15 minutes of showering. Reapply moisturizer at night, and moisturize your hands every time after you wash them.  Hair Loss (if your doctor says your hair will fall out)  . If your doctor says that your hair is likely to fall out, decide before you begin chemo whether you want to wear a wig. You may want to shop before treatment to match your hair color. . Hats, turbans, and scarves can also camouflage hair loss, although some people prefer to leave their heads uncovered. If you go bare-headed outdoors, be sure to use sunscreen on your scalp. . Cut your hair short. It eases the inconvenience of shedding lots of hair, but it also can reduce the emotional impact of watching your hair fall out. . Don't perm or color your hair during chemotherapy. Those chemical treatments are already damaging to hair and can enhance hair loss. Once your chemo treatments are done and your hair has grown back, it's OK to resume dyeing or perming hair. With chemotherapy, hair loss is almost always  temporary. But when it grows back, it may be a different color or texture. In older adults who still had hair color before chemotherapy, the new growth may be completely gray.  Often, new hair is very fine and soft.  Infection Prevention Please wash your hands for at least 30 seconds using warm soapy water. Handwashing is the #1 way to prevent the spread of germs. Stay away from sick people or people who are getting over a cold. If you develop respiratory systems such as green/yellow mucus production or productive cough or persistent cough let us know and we will see if you need an antibiotic. It is a good idea to keep a pair of gloves on when going into grocery stores/[employer] to decrease your risk of coming into contact with germs on the carts, etc. Carry alcohol hand gel with you at all times and use it frequently if out in public. If your temperature reaches 100.5 or higher please call the clinic and let us know.  If it is after hours or on the weekend please go to the ER if your temperature is over 100.5.  Please have your own personal thermometer at home to use.    Sex and bodily fluids If you are going to have sex, a condom must be used to protect the person that isn't taking chemotherapy. Chemo can decrease your libido (sex drive). For a few days after chemotherapy, chemotherapy can be excreted through your bodily fluids.  When using the toilet please close the lid and flush the toilet twice.  Do this for a few day after you have had chemotherapy.   Effects of chemotherapy on your sex life Some changes are simple and won't last long. They won't affect your sex life permanently. Sometimes you may feel: . too tired . not strong enough to be very active . sick or sore  . not in the mood . anxious or low Your anxiety might not seem related to sex. For example, you may be worried about the cancer and how your treatment is going. Or you may be worried about money, or about how you  family are coping  with your illness. These things can cause stress, which can affect your interest in sex. It's important to talk to your partner about how you feel. Remember - the changes to your sex life don't usually last long. There's usually no medical reason to stop having sex during chemo. The drugs won't have any long term physical effects on your performance or enjoyment of sex. Cancer can't be passed on to your partner during sex  Contraception It's important to use reliable contraception during treatment. Avoid getting pregnant while you or your partner are having chemotherapy. This is because the drugs may harm the baby. Sometimes chemotherapy drugs can leave a man or woman infertile.  This means you would not be able to have children in the future. You might want to talk to someone about permanent infertility. It can be very difficult to learn that you may no longer be able to have children. Some people find counselling helpful. There might be ways to preserve your fertility, although this is easier for men than for women. You may want to speak to a fertility expert. You can talk about sperm banking or harvesting your eggs. You can also ask about other fertility options, such as donor eggs. If you have or have had breast cancer, your doctor might advise you not to take the contraceptive pill. This is because the hormones in it might affect the cancer.  It is not known for sure whether or not chemotherapy drugs can be passed on through semen or secretions from the vagina. Because of this some doctors advise people to use a barrier method if you have sex during treatment. This applies to vaginal, anal or oral sex. Generally, doctors advise a barrier method only for the time you are actually having the treatment and for about a week after your treatment. Advice like this can be worrying, but this does not mean that you have to avoid being intimate with your partner. You can still have close contact with your  partner and continue to enjoy sex.  Animals If you have cats or birds we just ask that you not change the litter or change the cage.  Please have someone else do this for you while you are on chemotherapy.   Food Safety During and After Cancer Treatment Food safety is important for people both during and after cancer treatment. Cancer and cancer treatments, such as chemotherapy, radiation therapy, and stem cell/bone marrow transplantation, often weaken the immune system. This makes it harder for your body to protect itself from foodborne illness, also called food poisoning. Foodborne illness is caused by eating food that contains harmful bacteria, parasites, or viruses.  Foods to avoid Some foods have a higher risk of becoming tainted with bacteria. These include: Marland Kitchen Unwashed fresh fruit and vegetables, especially leafy vegetables that can hide dirt and other contaminants . Raw sprouts, such as alfalfa sprouts . Raw or undercooked beef, especially ground beef, or other raw or undercooked meat and poultry . Fatty, fried, or spicy foods immediately before or after treatment.  These can sit heavy on your stomach and make you feel nauseous. . Raw or undercooked shellfish, such as oysters. . Sushi and sashimi, which often contain raw fish.  . Unpasteurized beverages, such as unpasteurized fruit juices, raw milk, raw yogurt, or cider . Undercooked eggs, such as soft boiled, over easy, and poached; raw, unpasteurized eggs; or foods made with raw egg, such as homemade raw cookie dough and homemade mayonnaise Simple  steps for food safety Shop smart. . Do not buy food stored or displayed in an unclean area. . Do not buy bruised or damaged fruits or vegetables. . Do not buy cans that have cracks, dents, or bulges. . Pick up foods that can spoil at the end of your shopping trip and store them in a cooler on the way home. Prepare and clean up foods carefully. . Rinse all fresh fruits and vegetables under  running water, and dry them with a clean towel or paper towel. . Clean the top of cans before opening them. . After preparing food, wash your hands for 20 seconds with hot water and soap. Pay special attention to areas between fingers and under nails. . Clean your utensils and dishes with hot water and soap. Marland Kitchen Disinfect your kitchen and cutting boards using 1 teaspoon of liquid, unscented bleach mixed into 1 quart of water.   Dispose of old food. . Eat canned and packaged food before its expiration date (the "use by" or "best before" date). . Consume refrigerated leftovers within 3 to 4 days. After that time, throw out the food. Even if the food does not smell or look spoiled, it still may be unsafe. Some bacteria, such as Listeria, can grow even on foods stored in the refrigerator if they are kept for too long. Take precautions when eating out. . At restaurants, avoid buffets and salad bars where food sits out for a long time and comes in contact with many people. Food can become contaminated when someone with a virus, often a norovirus, or another "bug" handles it. . Put any leftover food in a "to-go" container yourself, rather than having the server do it. And, refrigerate leftovers as soon as you get home. . Choose restaurants that are clean and that are willing to prepare your food as you order it cooked.   MEDICATIONS:                                                                                                                                                                Compazine/Prochlorperazine 10mg  tablet. Take 1 tablet every 6 hours as needed for nausea/vomiting. (This can make you sleepy)   EMLA cream. Apply a quarter size amount to port site 1 hour prior to chemo. Do not rub in. Cover with plastic wrap.   Over-the-Counter Meds:  Colace - 100 mg capsules - take 2 capsules daily.  If this doesn't help then you can increase to 2 capsules twice daily.  Call us if this does not  help your bowels move.   Imodium 2mg  capsule. Take 2 capsules after the 1st loose stool and then 1 capsule every 2 hours until you go a total of 12 hours without having a loose stool. Call  the [facility] if loose stools continue. If diarrhea occurs at bedtime, take 2 capsules at bedtime. Then take 2 capsules every 4 hours until morning. Call [facility].    Diarrhea Sheet   If you are having loose stools/diarrhea, please purchase Imodium and begin taking as outlined:  At the first sign of poorly formed or loose stools you should begin taking Imodium (loperamide) 2 mg capsules.  Take two caplets (4mg ) followed by one caplet (2mg ) every 2 hours until you have had no diarrhea for 12 hours.  During the night take two caplets (4mg ) at bedtime and continue every 4 hours during the night until the morning.  Stop taking Imodium only after there is no sign of diarrhea for 12 hours.    Always call the [facility] if you are having loose stools/diarrhea that you can't get under control.  Loose stools/diarrhea leads to dehydration (loss of water) in your body.  We have other options of trying to get the loose stools/diarrhea to stop but you must let us know!   Constipation Sheet  Colace - 100 mg capsules - take 2 capsules daily.  If this doesn't help then you can increase to 2 capsules twice daily.  Please call if the above does not work for you.   Do not go more than 2 days without a bowel movement.  It is very important that you do not become constipated.  It will make you feel sick to your stomach (nausea) and can cause abdominal pain and vomiting.   Nausea Sheet   Compazine/Prochlorperazine 10mg  tablet. Take 1 tablet every 6 hours as needed for nausea/vomiting. (This can make you sleepy)  If you are having persistent nausea (nausea that does not stop) please call the [facility] and let us know the amount of nausea that you are experiencing.  If you begin to vomit, you need to call the  [facility] and if it is the weekend and you have vomited more than one time and can't get it to stop-go to the Emergency Room.  Persistent nausea/vomiting can lead to dehydration (loss of fluid in your body) and will make you feel terrible.   Ice chips, sips of clear liquids, foods that are @ room temperature, crackers, and toast tend to be better tolerated.   SYMPTOMS TO REPORT AS SOON AS POSSIBLE AFTER TREATMENT:   FEVER GREATER THAN 100.5 F  CHILLS WITH OR WITHOUT FEVER  NAUSEA AND VOMITING THAT IS NOT CONTROLLED WITH YOUR NAUSEA MEDICATION  UNUSUAL SHORTNESS OF BREATH  UNUSUAL BRUISING OR BLEEDING  TENDERNESS IN MOUTH AND THROAT WITH OR WITHOUT PRESENCE OF ULCERS  URINARY PROBLEMS  BOWEL PROBLEMS  UNUSUAL RASH      Wear comfortable clothing and clothing appropriate for easy access to any Portacath or PICC line. Let us know if there is anything that we can do to make your therapy better!    What to do if you need assistance after hours or on the weekends: CALL [phone].  HOLD on the line, do not hang up.  You will hear multiple messages but at the end you will be connected with a nurse triage line.  They will contact the doctor if necessary.  Most of the time they will be able to assist you.  Do not call the hospital operator.      I have been informed and understand all of the instructions given to me and have received a copy. I have been instructed to call the  clinic [phone] or my family physician as soon as possible for continued medical care, if indicated. I do not have any more questions at this time but understand that I may call the [facility] or the Patient Navigator at [phone] during office hours should I have questions or need assistance in obtaining follow-up care.

## 2018-04-07 NOTE — H&P (Signed)
Joel Jefferson. is an 42 y.o. male.   Chief Complaint: Sigmoid colon carcinoma, need for central venous access HPI: Patient is a 42 year old white male recently diagnosed with colon cancer who now is about to undergo chemotherapy and needs central venous access.  He currently has 0 out of 10 pain.  Past Medical History:  Diagnosis Date  . Family history of breast cancer   . GERD (gastroesophageal reflux disease)   . History of kidney stones   . Hypercholesterolemia   . Hypertension   . Urethral stricture     Past Surgical History:  Procedure Laterality Date  . BALLOON DILATION  04/06/2012   Procedure: BALLOON DILATION;  Surgeon: Joel Packer, MD;  Location: [facility];  Service: Urology;  Laterality: N/A;  . BIOPSY  02/24/2018   Procedure: BIOPSY;  Surgeon: Joel Binder, MD;  Location: [facility];  Service: Endoscopy;;  colon  . COLONOSCOPY WITH PROPOFOL N/A 02/24/2018   Procedure: COLONOSCOPY WITH PROPOFOL;  Surgeon: Joel Binder, MD;  Location: [facility];  Service: Endoscopy;  Laterality: N/A;  9:30am  . ESOPHAGOGASTRODUODENOSCOPY (EGD) WITH PROPOFOL N/A 08/29/2015   Dr. Oneida Jefferson: possible proximal esophageal web s/p dilation, moderate gastritis, negative eosinophilic esophagitis   . PARTIAL COLECTOMY N/A 03/02/2018   Procedure: PARTIAL COLECTOMY;  Surgeon: Joel Signs, MD;  Location: [facility];  Service: General;  Laterality: N/A;  . SAVORY DILATION N/A 08/29/2015   Procedure: SAVORY DILATION;  Surgeon: Joel Binder, MD;  Location: [facility];  Service: Endoscopy;  Laterality: N/A;  . URETHRAL DILATION    . WISDOM TOOTH EXTRACTION      Family History  Problem Relation Age of Onset  . Dementia Mother   . Dementia Maternal Aunt   . Dementia Maternal Grandmother   . COPD Maternal Grandfather   . Breast cancer Maternal Aunt 42  . Breast cancer Maternal Aunt        dx in her 74s  . Lung cancer Maternal Aunt   . Healthy Son   . Colon  cancer Neg Hx    Social History:  reports that he quit smoking about 6 years ago. His smoking use included cigarettes. He has a 2.25 pack-year smoking history. He has never used smokeless tobacco. He reports current alcohol use of about 6.0 standard drinks of alcohol per week. He reports that he does not use drugs.  Allergies: No Known Allergies  No medications prior to admission.    Results for orders placed or performed in visit on 04/06/18 (from the past 48 hour(s))  CEA     Status: None   Collection Time: 04/06/18 12:17 PM  Result Value Ref Range   CEA 1.5 0.0 - 4.7 ng/mL    Comment: (NOTE)                             Nonsmokers          <3.9                             Smokers             <5.6 Roche [employer] Electrochemiluminescence Immunoassay (ECLIA) Values obtained with different assay methods or kits cannot be used interchangeably.  Results cannot be interpreted as absolute evidence of the presence or absence of malignant disease. Performed At: [facility] [place],  [place] [id number] Rush Farmer MD HD:[id number]   CBC with Differential/Platelet     Status: None   Collection Time: 04/06/18 12:17 PM  Result Value Ref Range   WBC 6.5 4.0 - 10.5 K/uL   RBC 4.86 4.22 - 5.81 MIL/uL   Hemoglobin 13.7 13.0 - 17.0 g/dL   HCT 42.2 39.0 - 52.0 %   MCV 86.8 80.0 - 100.0 fL   MCH 28.2 26.0 - 34.0 pg   MCHC 32.5 30.0 - 36.0 g/dL   RDW 14.5 11.5 - 15.5 %   Platelets 203 150 - 400 K/uL   nRBC 0.0 0.0 - 0.2 %   Neutrophils Relative % 65 %   Neutro Abs 4.3 1.7 - 7.7 K/uL   Lymphocytes Relative 27 %   Lymphs Abs 1.8 0.7 - 4.0 K/uL   Monocytes Relative 4 %   Monocytes Absolute 0.3 0.1 - 1.0 K/uL   Eosinophils Relative 3 %   Eosinophils Absolute 0.2 0.0 - 0.5 K/uL   Basophils Relative 1 %   Basophils Absolute 0.0 0.0 - 0.1 K/uL   Immature Granulocytes 0 %   Abs Immature Granulocytes 0.02 0.00 - 0.07 K/uL    Comment: Performed at [facility], [address] [place], [place] [address]  Comprehensive metabolic panel     Status: Abnormal   Collection Time: 04/06/18 12:17 PM  Result Value Ref Range   Sodium 140 135 - 145 mmol/L   Potassium 4.0 3.5 - 5.1 mmol/L   Chloride 113 (H) 98 - 111 mmol/L   CO2 20 (L) 22 - 32 mmol/L   Glucose, Bld 125 (H) 70 - 99 mg/dL   BUN 15 6 - 20 mg/dL   Creatinine, Ser 0.86 0.61 - 1.24 mg/dL   Calcium 8.9 8.9 - 10.3 mg/dL   Total Protein 7.0 6.5 - 8.1 g/dL   Albumin 4.2 3.5 - 5.0 g/dL   AST 29 15 - 41 U/L   ALT 45 (H) 0 - 44 U/L   Alkaline Phosphatase 49 38 - 126 U/L   Total Bilirubin 0.4 0.3 - 1.2 mg/dL   GFR calc non Af Amer >60 >60 mL/min   GFR calc Af Amer >60 >60 mL/min   Anion gap 7 5 - 15    Comment: Performed at [facility], [address] [place], [place] [address]   No results found.  Review of Systems  Constitutional: Negative.   HENT: Negative.   Eyes: Negative.   Respiratory: Negative.   Cardiovascular: Negative.   Gastrointestinal: Negative.   Genitourinary: Negative.   Musculoskeletal: Negative.   Skin: Negative.   Neurological: Negative.   Endo/Heme/Allergies: Negative.   Psychiatric/Behavioral: Negative.     There were no vitals taken for this visit. Physical Exam  Vitals reviewed. Constitutional: He appears well-developed and well-nourished. No distress.  HENT:  Head: Normocephalic and atraumatic.  Cardiovascular: Normal rate, regular rhythm and normal heart sounds. Exam reveals no gallop and no friction rub.  No murmur heard. Respiratory: Effort normal and breath sounds normal. No respiratory distress. He has no wheezes. He has no rales.  GI: Soft. Bowel sounds are normal. He exhibits no distension. There is no abdominal tenderness. There is no rebound and no guarding.  Midline incision healing well.     Assessment/Plan Impression: Sigmoid colon carcinoma, need for central venous access for chemotherapy Plan: Patient is scheduled for a Port-A-Cath  insertion on 04/13/2018.  The risks and benefits of the procedure including bleeding, infection, and pneumothorax were fully  explained to the patient, who gave informed consent.  Joel Signs, MD 04/07/2018, 9:21 AM

## 2018-04-07 NOTE — Progress Notes (Signed)
Chemotherapy teaching packet pulled together. 

## 2018-04-10 ENCOUNTER — Encounter (HOSPITAL_COMMUNITY): Payer: Self-pay

## 2018-04-10 ENCOUNTER — Encounter (HOSPITAL_COMMUNITY)
Admission: RE | Admit: 2018-04-10 | Discharge: 2018-04-10 | Disposition: A | Discharge status: Home / Self Care | Payer: BLUE CROSS/BLUE SHIELD | Source: Ambulatory Visit | Attending: General Surgery | Admitting: General Surgery

## 2018-04-13 ENCOUNTER — Ambulatory Visit (HOSPITAL_COMMUNITY)
Admission: RE | Admit: 2018-04-13 | Discharge: 2018-04-13 | Disposition: A | Discharge status: Home / Self Care | Payer: BLUE CROSS/BLUE SHIELD | Attending: General Surgery | Admitting: General Surgery

## 2018-04-13 ENCOUNTER — Encounter (HOSPITAL_COMMUNITY): Payer: Self-pay

## 2018-04-13 ENCOUNTER — Ambulatory Visit (HOSPITAL_COMMUNITY)
Admission: RE | Admit: 2018-04-13 | Discharge: 2018-04-13 | Disposition: A | Discharge status: Home / Self Care | Payer: BLUE CROSS/BLUE SHIELD | Source: Ambulatory Visit | Attending: General Surgery | Admitting: General Surgery

## 2018-04-13 ENCOUNTER — Ambulatory Visit (HOSPITAL_COMMUNITY): Payer: BLUE CROSS/BLUE SHIELD | Admitting: Anesthesiology

## 2018-04-13 ENCOUNTER — Encounter (HOSPITAL_COMMUNITY)
Admission: RE | Disposition: A | Discharge status: Home / Self Care | Payer: Self-pay | Source: Home / Self Care | Attending: General Surgery

## 2018-04-13 ENCOUNTER — Ambulatory Visit: Payer: Self-pay

## 2018-04-13 ENCOUNTER — Ambulatory Visit (HOSPITAL_COMMUNITY): Payer: BLUE CROSS/BLUE SHIELD

## 2018-04-13 DIAGNOSIS — C187 Malignant neoplasm of sigmoid colon: Secondary | ICD-10-CM | POA: Diagnosis not present

## 2018-04-13 DIAGNOSIS — Z4682 Encounter for fitting and adjustment of non-vascular catheter: Secondary | ICD-10-CM | POA: Diagnosis not present

## 2018-04-13 DIAGNOSIS — Z87891 Personal history of nicotine dependence: Secondary | ICD-10-CM | POA: Diagnosis not present

## 2018-04-13 DIAGNOSIS — I10 Essential (primary) hypertension: Secondary | ICD-10-CM | POA: Insufficient documentation

## 2018-04-13 DIAGNOSIS — C189 Malignant neoplasm of colon, unspecified: Secondary | ICD-10-CM | POA: Diagnosis not present

## 2018-04-13 DIAGNOSIS — Z452 Encounter for adjustment and management of vascular access device: Secondary | ICD-10-CM

## 2018-04-13 DIAGNOSIS — Z9049 Acquired absence of other specified parts of digestive tract: Secondary | ICD-10-CM | POA: Insufficient documentation

## 2018-04-13 HISTORY — PX: PORTACATH PLACEMENT: SHX2246

## 2018-04-13 SURGERY — INSERTION, TUNNELED CENTRAL VENOUS DEVICE, WITH PORT
Anesthesia: Monitor Anesthesia Care | Site: Chest | Laterality: Left

## 2018-04-13 MED ORDER — CEFAZOLIN SODIUM-DEXTROSE 2-4 GM/100ML-% IV SOLN
2.0000 g | INTRAVENOUS | Status: AC
  Administered 2018-04-13: 2 g via INTRAVENOUS
  Filled 2018-04-13: qty 100

## 2018-04-13 MED ORDER — LACTATED RINGERS IV SOLN: INTRAVENOUS | Status: DC

## 2018-04-13 MED ORDER — CHLORHEXIDINE GLUCONATE CLOTH 2 % EX PADS: 6.0000 | MEDICATED_PAD | Freq: Once | CUTANEOUS | Status: DC

## 2018-04-13 MED ORDER — HEPARIN SOD (PORK) LOCK FLUSH 100 UNIT/ML IV SOLN
INTRAVENOUS | Status: DC | PRN
  Administered 2018-04-13: 500 [IU] via INTRAVENOUS

## 2018-04-13 MED ORDER — MIDAZOLAM HCL 2 MG/2ML IJ SOLN
INTRAMUSCULAR | Status: AC
  Filled 2018-04-13: qty 2

## 2018-04-13 MED ORDER — FENTANYL CITRATE (PF) 100 MCG/2ML IJ SOLN
INTRAMUSCULAR | Status: DC | PRN
  Administered 2018-04-13: 25 ug via INTRAVENOUS

## 2018-04-13 MED ORDER — HYDROMORPHONE HCL 1 MG/ML IJ SOLN: 0.2500 mg | INTRAMUSCULAR | Status: DC | PRN

## 2018-04-13 MED ORDER — KETOROLAC TROMETHAMINE 30 MG/ML IJ SOLN
30.0000 mg | Freq: Once | INTRAMUSCULAR | Status: AC
  Administered 2018-04-13: 30 mg via INTRAVENOUS
  Filled 2018-04-13: qty 1

## 2018-04-13 MED ORDER — MEPERIDINE HCL 50 MG/ML IJ SOLN: 6.2500 mg | INTRAMUSCULAR | Status: DC | PRN

## 2018-04-13 MED ORDER — PROMETHAZINE HCL 25 MG/ML IJ SOLN: 6.2500 mg | INTRAMUSCULAR | Status: DC | PRN

## 2018-04-13 MED ORDER — HYDROCODONE-ACETAMINOPHEN 7.5-325 MG PO TABS: 1.0000 | ORAL_TABLET | Freq: Once | ORAL | Status: DC | PRN

## 2018-04-13 MED ORDER — MIDAZOLAM HCL 5 MG/5ML IJ SOLN
INTRAMUSCULAR | Status: DC | PRN
  Administered 2018-04-13: 2 mg via INTRAVENOUS

## 2018-04-13 MED ORDER — LIDOCAINE HCL (PF) 1 % IJ SOLN
INTRAMUSCULAR | Status: AC
  Filled 2018-04-13: qty 30

## 2018-04-13 MED ORDER — SODIUM CHLORIDE (PF) 0.9 % IJ SOLN
INTRAMUSCULAR | Status: DC | PRN
  Administered 2018-04-13: 10 mL

## 2018-04-13 MED ORDER — PROPOFOL 10 MG/ML IV BOLUS
INTRAVENOUS | Status: DC | PRN
  Administered 2018-04-13: 30 mg via INTRAVENOUS

## 2018-04-13 MED ORDER — LACTATED RINGERS IV SOLN
INTRAVENOUS | Status: DC | PRN
  Administered 2018-04-13: 10:00:00 via INTRAVENOUS

## 2018-04-13 MED ORDER — PROPOFOL 500 MG/50ML IV EMUL
INTRAVENOUS | Status: DC | PRN
  Administered 2018-04-13: 150 ug/kg/min via INTRAVENOUS

## 2018-04-13 MED ORDER — LIDOCAINE HCL (PF) 1 % IJ SOLN
INTRAMUSCULAR | Status: DC | PRN
  Administered 2018-04-13: 10 mL

## 2018-04-13 MED ORDER — HEPARIN SOD (PORK) LOCK FLUSH 100 UNIT/ML IV SOLN
INTRAVENOUS | Status: AC
  Filled 2018-04-13: qty 5

## 2018-04-13 SURGICAL SUPPLY — 32 items
ADH SKN CLS APL DERMABOND .7 (GAUZE/BANDAGES/DRESSINGS) ×1
BAG DECANTER FOR FLEXI CONT (MISCELLANEOUS) ×2 IMPLANT
CHLORAPREP W/TINT 10.5 ML (MISCELLANEOUS) ×2 IMPLANT
CLOTH BEACON ORANGE TIMEOUT ST (SAFETY) ×2 IMPLANT
COVER LIGHT HANDLE STERIS (MISCELLANEOUS) ×4 IMPLANT
COVER WAND RF STERILE (DRAPES) ×1 IMPLANT
DECANTER SPIKE VIAL GLASS SM (MISCELLANEOUS) ×2 IMPLANT
DERMABOND ADVANCED (GAUZE/BANDAGES/DRESSINGS) ×1
DERMABOND ADVANCED .7 DNX12 (GAUZE/BANDAGES/DRESSINGS) ×1 IMPLANT
DRAPE C-ARM FOLDED MOBILE STRL (DRAPES) ×2 IMPLANT
ELECT REM PT RETURN 9FT ADLT (ELECTROSURGICAL) ×2
ELECTRODE REM PT RTRN 9FT ADLT (ELECTROSURGICAL) ×1 IMPLANT
GLOVE BIO SURGEON STRL SZ7 (GLOVE) ×1 IMPLANT
GLOVE BIOGEL PI IND STRL 7.0 (GLOVE) ×2 IMPLANT
GLOVE BIOGEL PI INDICATOR 7.0 (GLOVE) ×2
GLOVE SURG SS PI 7.5 STRL IVOR (GLOVE) ×2 IMPLANT
GOWN STRL REUS W/TWL LRG LVL3 (GOWN DISPOSABLE) ×4 IMPLANT
IV NS 500ML (IV SOLUTION) ×2
IV NS 500ML BAXH (IV SOLUTION) ×1 IMPLANT
KIT PORT POWER 8FR ISP MRI (Port) ×2 IMPLANT
KIT TURNOVER KIT A (KITS) ×2 IMPLANT
NDL HYPO 25X1 1.5 SAFETY (NEEDLE) ×1 IMPLANT
NEEDLE HYPO 25X1 1.5 SAFETY (NEEDLE) ×2 IMPLANT
PACK MINOR (CUSTOM PROCEDURE TRAY) ×2 IMPLANT
PAD ARMBOARD 7.5X6 YLW CONV (MISCELLANEOUS) ×2 IMPLANT
SET BASIN LINEN APH (SET/KITS/TRAYS/PACK) ×2 IMPLANT
SUT MNCRL AB 4-0 PS2 18 (SUTURE) ×2 IMPLANT
SUT VIC AB 3-0 SH 27 (SUTURE) ×2
SUT VIC AB 3-0 SH 27X BRD (SUTURE) ×1 IMPLANT
SYR 20CC LL (SYRINGE) ×2 IMPLANT
SYR 5ML LL (SYRINGE) ×2 IMPLANT
SYR CONTROL 10ML LL (SYRINGE) ×2 IMPLANT

## 2018-04-13 NOTE — Op Note (Signed)
Patient:  Joel Jefferson.  DOB:  10/14/1975  MRN:  [id number]   Preop Diagnosis: Colon carcinoma, need for central venous access  Postop Diagnosis: Same  Procedure: Port-A-Cath insertion  Surgeon: Aviva Signs, MD  Anes: MAC  Indications: Patient is a 43 year old white male who presents for Port-A-Cath insertion due to the need for central venous access.  The risks and benefits of the procedure including bleeding, infection, and pneumothorax were fully explained to the patient, who gave informed consent.  Procedure note: The patient was placed in the Trendelenburg position after the left upper chest was prepped and draped using the usual sterile technique with DuraPrep.  Surgical site confirmation was performed.  1% Xylocaine was used for local anesthesia.  An incision was made below the left clavicle.  A subcutaneous pocket was formed.  A needle was advanced into the left subclavian vein using the Seldinger technique without difficulty.  The guidewire was then advanced into the right atrium under fluoroscopic guidance.  An introducer and peel-away sheath were placed over the guidewire.  The catheter was then inserted through the peel-away sheath and the peel-away sheath was removed.  The catheter was then attached to the port and the port placed in subcutaneous pocket.  Adequate positioning was confirmed by fluoroscopy.  Good backflow blood was noted on aspiration of the port.  The port was flushed with heparin flush.  Subcutaneous layer was reapproximated using a 3-0 Vicryl interrupted suture.  The skin was closed using a 4-0 Monocryl subcuticular suture.  Dermabond was applied.  All tape and needle counts were correct at the end of the procedure.  The patient was awakened and transferred to PACU in stable condition.  A chest x-ray will be performed at that time.  Complications: None  EBL: Minimal  Specimen: None

## 2018-04-13 NOTE — Discharge Instructions (Signed)
Implanted Port Home Guide °An implanted port is a device that is placed under the skin. It is usually placed in the chest. The device can be used to give IV medicine, to take blood, or for dialysis. You may have an implanted port if: °· You need IV medicine that would be irritating to the small veins in your hands or arms. °· You need IV medicines, such as antibiotics, for a long period of time. °· You need IV nutrition for a long period of time. °· You need dialysis. °Having a port means that your health care provider will not need to use the veins in your arms for these procedures. You may have fewer limitations when using a port than you would if you used other types of long-term IVs, and you will likely be able to return to normal activities after your incision heals. °An implanted port has two main parts: °· Reservoir. The reservoir is the part where a needle is inserted to give medicines or draw blood. The reservoir is round. After it is placed, it appears as a small, raised area under your skin. °· Catheter. The catheter is a thin, flexible tube that connects the reservoir to a vein. Medicine that is inserted into the reservoir goes into the catheter and then into the vein. °How is my port accessed? °To access your port: °· A numbing cream may be placed on the skin over the port site. °· Your health care provider will put on a mask and sterile gloves. °· The skin over your port will be cleaned carefully with a germ-killing soap and allowed to dry. °· Your health care provider will gently pinch the port and insert a needle into it. °· Your health care provider will check for a blood return to make sure the port is in the vein and is not clogged. °· If your port needs to remain accessed to get medicine continuously (constant infusion), your health care provider will place a clear bandage (dressing) over the needle site. The dressing and needle will need to be changed every week, or as told by your health care  provider. °What is flushing? °Flushing helps keep the port from getting clogged. Follow instructions from your health care provider about how and when to flush the port. Ports are usually flushed with saline solution or a medicine called heparin. The need for flushing will depend on how the port is used: °· If the port is only used from time to time to give medicines or draw blood, the port may need to be flushed: °? Before and after medicines have been given. °? Before and after blood has been drawn. °? As part of routine maintenance. Flushing may be recommended every 4-6 weeks. °· If a constant infusion is running, the port may not need to be flushed. °· Throw away any syringes in a disposal container that is meant for sharp items (sharps container). You can buy a sharps container from a pharmacy, or you can make one by using an empty hard plastic bottle with a cover. °How long will my port stay implanted? °The port can stay in for as long as your health care provider thinks it is needed. When it is time for the port to come out, a surgery will be done to remove it. The surgery will be similar to the procedure that was done to put the port in. °Follow these instructions at home: ° °· Flush your port as told by your health care provider. °·   If you need an infusion over several days, follow instructions from your health care provider about how to take care of your port site. Make sure you: °? Wash your hands with soap and water before you change your dressing. If soap and water are not available, use alcohol-based hand sanitizer. °? Change your dressing as told by your health care provider. °? Place any used dressings or infusion bags into a plastic bag. Throw that bag in the trash. °? Keep the dressing that covers the needle clean and dry. Do not get it wet. °? Do not use scissors or sharp objects near the tube. °? Keep the tube clamped, unless it is being used. °· Check your port site every day for signs of  infection. Check for: °? Redness, swelling, or pain. °? Fluid or blood. °? Pus or a bad smell. °· Protect the skin around the port site. °? Avoid wearing bra straps that rub or irritate the site. °? Protect the skin around your port from seat belts. Place a soft pad over your chest if needed. °· Bathe or shower as told by your health care provider. The site may get wet as long as you are not actively receiving an infusion. °· Return to your normal activities as told by your health care provider. Ask your health care provider what activities are safe for you. °· Carry a medical alert card or wear a medical alert bracelet at all times. This will let health care providers know that you have an implanted port in case of an emergency. °Get help right away if: °· You have redness, swelling, or pain at the port site. °· You have fluid or blood coming from your port site. °· You have pus or a bad smell coming from the port site. °· You have a fever. °Summary °· Implanted ports are usually placed in the chest for long-term IV access. °· Follow instructions from your health care provider about flushing the port and changing bandages (dressings). °· Take care of the area around your port by avoiding clothing that puts pressure on the area, and by watching for signs of infection. °· Protect the skin around your port from seat belts. Place a soft pad over your chest if needed. °· Get help right away if you have a fever or you have redness, swelling, pain, drainage, or a bad smell at the port site. °This information is not intended to replace advice given to you by your health care provider. Make sure you discuss any questions you have with your health care provider. °Document Released: 03/25/2005 Document Revised: 04/27/2016 Document Reviewed: 04/27/2016 °Elsevier Interactive Patient Education © 2019 [employer] ° ° ° ° °Monitored Anesthesia Care, Care After °These instructions provide you with information about caring for  yourself after your procedure. Your health care provider may also give you more specific instructions. Your treatment has been planned according to current medical practices, but problems sometimes occur. Call your health care provider if you have any problems or questions after your procedure. °What can I expect after the procedure? °After your procedure, you may: °· Feel sleepy for several hours. °· Feel clumsy and have poor balance for several hours. °· Feel forgetful about what happened after the procedure. °· Have poor judgment for several hours. °· Feel nauseous or vomit. °· Have a sore throat if you had a breathing tube during the procedure. °Follow these instructions at home: °For at least 24 hours after the procedure: ° °  ° °·   Have a responsible adult stay with you. It is important to have someone help care for you until you are awake and alert. °· Rest as needed. °· Do not: °? Participate in activities in which you could fall or become injured. °? Drive. °? Use heavy machinery. °? Drink alcohol. °? Take sleeping pills or medicines that cause drowsiness. °? Make important decisions or sign legal documents. °? Take care of children on your own. °Eating and drinking °· Follow the diet that is recommended by your health care provider. °· If you vomit, drink water, juice, or soup when you can drink without vomiting. °· Make sure you have little or no nausea before eating solid foods. °General instructions °· Take over-the-counter and prescription medicines only as told by your health care provider. °· If you have sleep apnea, surgery and certain medicines can increase your risk for breathing problems. Follow instructions from your health care provider about wearing your sleep device: °? Anytime you are sleeping, including during daytime naps. °? While taking prescription pain medicines, sleeping medicines, or medicines that make you drowsy. °· If you smoke, do not smoke without supervision. °· Keep all  follow-up visits as told by your health care provider. This is important. °Contact a health care provider if: °· You keep feeling nauseous or you keep vomiting. °· You feel light-headed. °· You develop a rash. °· You have a fever. °Get help right away if: °· You have trouble breathing. °Summary °· For several hours after your procedure, you may feel sleepy and have poor judgment. °· Have a responsible adult stay with you for at least 24 hours or until you are awake and alert. °This information is not intended to replace advice given to you by your health care provider. Make sure you discuss any questions you have with your health care provider. °Document Released: 07/16/2015 Document Revised: 11/08/2016 Document Reviewed: 07/16/2015 °Elsevier Interactive Patient Education © 2019 [employer] ° ° °

## 2018-04-13 NOTE — Interval H&P Note (Signed)
History and Physical Interval Note:  04/13/2018 9:52 AM  Joel Jefferson.  has presented today for surgery, with the diagnosis of colon cancer  The various methods of treatment have been discussed with the patient and family. After consideration of risks, benefits and other options for treatment, the patient has consented to  Procedure(s): INSERTION PORT-A-CATH (N/A) as a surgical intervention .  The patient's history has been reviewed, patient examined, no change in status, stable for surgery.  I have reviewed the patient's chart and labs.  Questions were answered to the patient's satisfaction.     Aviva Signs

## 2018-04-13 NOTE — Anesthesia Preprocedure Evaluation (Signed)
Anesthesia Evaluation    Airway Mallampati: II       Dental  (+) Teeth Intact, Dental Advidsory Given   Pulmonary former smoker,    breath sounds clear to auscultation       Cardiovascular hypertension,  Rhythm:regular     Neuro/Psych    GI/Hepatic GERD  ,  Endo/Other    Renal/GU      Musculoskeletal   Abdominal   Peds  Hematology   Anesthesia Other Findings Colon CA s/p partial colectomy  Reproductive/Obstetrics                             Anesthesia Physical Anesthesia Plan  ASA: III  Anesthesia Plan: MAC   Post-op Pain Management:    Induction:   PONV Risk Score and Plan:   Airway Management Planned:   Additional Equipment:   Intra-op Plan:   Post-operative Plan:   Informed Consent: I have reviewed the patients History and Physical, chart, labs and discussed the procedure including the risks, benefits and alternatives for the proposed anesthesia with the patient or authorized representative who has indicated his/her understanding and acceptance.   Dental Advisory Given  Plan Discussed with: Anesthesiologist  Anesthesia Plan Comments:         Anesthesia Quick Evaluation

## 2018-04-13 NOTE — Transfer of Care (Signed)
Immediate Anesthesia Transfer of Care Note  Patient: Joel Jefferson.  Procedure(s) Performed: INSERTION PORT-A-CATH (Left Chest)  Patient Location: PACU  Anesthesia Type:General  Level of Consciousness: awake, alert  and oriented  Airway & Oxygen Therapy: Patient Spontanous Breathing  Post-op Assessment: Report given to RN  Post vital signs: Reviewed and stable  Last Vitals:  Vitals Value Taken Time  BP    Temp    Pulse 73 04/13/2018 11:33 AM  Resp    SpO2 99 % 04/13/2018 11:33 AM  Vitals shown include unvalidated device data.  Last Pain:  Vitals:   04/13/18 1016  TempSrc: Oral  PainSc: 0-No pain      Patients Stated Pain Goal: 7 (65/80/06 3494)  Complications: No apparent anesthesia complications

## 2018-04-14 ENCOUNTER — Ambulatory Visit (HOSPITAL_COMMUNITY): Payer: Self-pay | Admitting: Hematology

## 2018-04-14 ENCOUNTER — Other Ambulatory Visit (HOSPITAL_COMMUNITY): Payer: Self-pay

## 2018-04-14 ENCOUNTER — Ambulatory Visit (HOSPITAL_COMMUNITY): Payer: Self-pay

## 2018-04-14 ENCOUNTER — Inpatient Hospital Stay (HOSPITAL_COMMUNITY): Payer: Self-pay

## 2018-04-14 ENCOUNTER — Encounter (HOSPITAL_COMMUNITY): Payer: Self-pay | Admitting: General Surgery

## 2018-04-14 NOTE — Anesthesia Postprocedure Evaluation (Signed)
Anesthesia Post Note  Patient: Joel Jefferson.  Procedure(s) Performed: INSERTION PORT-A-CATH (Left Chest)  Patient location during evaluation: PACU Anesthesia Type: MAC Level of consciousness: awake and alert and oriented Pain management: pain level controlled Vital Signs Assessment: post-procedure vital signs reviewed and stable Respiratory status: spontaneous breathing Cardiovascular status: stable and blood pressure returned to baseline Postop Assessment: no apparent nausea or vomiting Anesthetic complications: no Comments: Late entry     Last Vitals:  Vitals:   04/13/18 1200 04/13/18 1207  BP: 115/78 121/73  Pulse: 71 72  Resp: 15 18  Temp:  36.8 C  SpO2: 99% 100%    Last Pain:  Vitals:   04/13/18 1207  TempSrc: Oral  PainSc: 2                  Belinda Bringhurst

## 2018-04-16 ENCOUNTER — Other Ambulatory Visit: Payer: Self-pay | Admitting: General Surgery

## 2018-04-16 ENCOUNTER — Encounter (HOSPITAL_COMMUNITY): Payer: Self-pay

## 2018-04-19 ENCOUNTER — Other Ambulatory Visit (HOSPITAL_COMMUNITY): Payer: Self-pay | Admitting: Hematology

## 2018-04-19 DIAGNOSIS — C187 Malignant neoplasm of sigmoid colon: Secondary | ICD-10-CM

## 2018-04-21 ENCOUNTER — Inpatient Hospital Stay (HOSPITAL_COMMUNITY): Payer: BLUE CROSS/BLUE SHIELD

## 2018-04-21 ENCOUNTER — Encounter (HOSPITAL_COMMUNITY): Payer: Self-pay | Admitting: Hematology

## 2018-04-21 ENCOUNTER — Inpatient Hospital Stay (HOSPITAL_COMMUNITY): Payer: BLUE CROSS/BLUE SHIELD | Attending: Hematology

## 2018-04-21 ENCOUNTER — Encounter (HOSPITAL_COMMUNITY): Payer: Self-pay

## 2018-04-21 ENCOUNTER — Inpatient Hospital Stay (HOSPITAL_BASED_OUTPATIENT_CLINIC_OR_DEPARTMENT_OTHER): Payer: BLUE CROSS/BLUE SHIELD | Admitting: Hematology

## 2018-04-21 VITALS — BP 127/78 | HR 73 | Temp 98.5°F | Resp 16 | Wt 220.2 lb

## 2018-04-21 DIAGNOSIS — C187 Malignant neoplasm of sigmoid colon: Secondary | ICD-10-CM

## 2018-04-21 DIAGNOSIS — R110 Nausea: Secondary | ICD-10-CM | POA: Diagnosis not present

## 2018-04-21 DIAGNOSIS — Z5111 Encounter for antineoplastic chemotherapy: Secondary | ICD-10-CM | POA: Diagnosis not present

## 2018-04-21 DIAGNOSIS — Z452 Encounter for adjustment and management of vascular access device: Secondary | ICD-10-CM | POA: Diagnosis not present

## 2018-04-21 LAB — COMPREHENSIVE METABOLIC PANEL
ALT: 87 U/L — ABNORMAL HIGH (ref 0–44)
AST: 49 U/L — ABNORMAL HIGH (ref 15–41)
Albumin: 3.9 g/dL (ref 3.5–5.0)
Alkaline Phosphatase: 46 U/L (ref 38–126)
Anion gap: 7 (ref 5–15)
BILIRUBIN TOTAL: 0.7 mg/dL (ref 0.3–1.2)
BUN: 18 mg/dL (ref 6–20)
CALCIUM: 9 mg/dL (ref 8.9–10.3)
CO2: 21 mmol/L — ABNORMAL LOW (ref 22–32)
Chloride: 111 mmol/L (ref 98–111)
Creatinine, Ser: 1.17 mg/dL (ref 0.61–1.24)
GFR calc Af Amer: >60 mL/min (ref >60)
GFR calc non Af Amer: >60 mL/min (ref >60)
Glucose, Bld: 158 mg/dL — ABNORMAL HIGH (ref 70–99)
Potassium: 4 mmol/L (ref 3.5–5.1)
Sodium: 139 mmol/L (ref 135–145)
Total Protein: 6.7 g/dL (ref 6.5–8.1)

## 2018-04-21 LAB — CBC WITH DIFFERENTIAL/PLATELET
ABS IMMATURE GRANULOCYTES: 0.03 10*3/uL (ref 0.00–0.07)
Basophils Absolute: 0.1 10*3/uL (ref 0.0–0.1)
Basophils Relative: 1 %
Eosinophils Absolute: 0.3 10*3/uL (ref 0.0–0.5)
Eosinophils Relative: 4 %
HCT: 43.5 % (ref 39.0–52.0)
Hemoglobin: 14.1 g/dL (ref 13.0–17.0)
Immature Granulocytes: 0 %
Lymphocytes Relative: 27 %
Lymphs Abs: 1.8 10*3/uL (ref 0.7–4.0)
MCH: 28.4 pg (ref 26.0–34.0)
MCHC: 32.4 g/dL (ref 30.0–36.0)
MCV: 87.5 fL (ref 80.0–100.0)
MONO ABS: 0.4 10*3/uL (ref 0.1–1.0)
Monocytes Relative: 6 %
Neutro Abs: 4.2 10*3/uL (ref 1.7–7.7)
Neutrophils Relative %: 62 %
Platelets: 168 10*3/uL (ref 150–400)
RBC: 4.97 MIL/uL (ref 4.22–5.81)
RDW: 14.7 % (ref 11.5–15.5)
WBC: 6.8 10*3/uL (ref 4.0–10.5)
nRBC: 0 % (ref 0.0–0.2)

## 2018-04-21 MED ORDER — OXALIPLATIN CHEMO INJECTION 100 MG/20ML
85.0000 mg/m2 | Freq: Once | INTRAVENOUS | Status: AC
  Administered 2018-04-21: 185 mg via INTRAVENOUS
  Filled 2018-04-21: qty 37

## 2018-04-21 MED ORDER — DEXTROSE 5 % IV SOLN
Freq: Once | INTRAVENOUS | Status: AC
  Administered 2018-04-21: 10:00:00 via INTRAVENOUS

## 2018-04-21 MED ORDER — LEUCOVORIN CALCIUM INJECTION 350 MG
800.0000 mg | Freq: Once | INTRAVENOUS | Status: AC
  Administered 2018-04-21: 800 mg via INTRAVENOUS
  Filled 2018-04-21: qty 35

## 2018-04-21 MED ORDER — SODIUM CHLORIDE 0.9 % IV SOLN
2400.0000 mg/m2 | INTRAVENOUS | Status: DC
  Administered 2018-04-21: 5250 mg via INTRAVENOUS
  Filled 2018-04-21: qty 50

## 2018-04-21 MED ORDER — PALONOSETRON HCL INJECTION 0.25 MG/5ML
0.2500 mg | Freq: Once | INTRAVENOUS | Status: AC
  Administered 2018-04-21: 0.25 mg via INTRAVENOUS

## 2018-04-21 MED ORDER — PALONOSETRON HCL INJECTION 0.25 MG/5ML
INTRAVENOUS | Status: AC
  Filled 2018-04-21: qty 5

## 2018-04-21 MED ORDER — SODIUM CHLORIDE 0.9 % IV SOLN
10.0000 mg | Freq: Once | INTRAVENOUS | Status: AC
  Administered 2018-04-21: 10 mg via INTRAVENOUS
  Filled 2018-04-21: qty 1

## 2018-04-21 MED ORDER — SODIUM CHLORIDE 0.9% FLUSH
10.0000 mL | INTRAVENOUS | Status: DC | PRN
  Administered 2018-04-21: 10 mL
  Filled 2018-04-21: qty 10

## 2018-04-21 MED ORDER — FLUOROURACIL CHEMO INJECTION 2.5 GM/50ML
400.0000 mg/m2 | Freq: Once | INTRAVENOUS | Status: AC
  Administered 2018-04-21: 900 mg via INTRAVENOUS
  Filled 2018-04-21: qty 18

## 2018-04-21 NOTE — Assessment & Plan Note (Signed)
1.  Stage III (pT3 pN2b) sigmoid colon adenocarcinoma, MSI-stable: - Patient had left lower quadrant abdominal pain since October, [facility] pelvis showed sigmoid colon thickening. -he underwent colonoscopy on 03/06/2018 showing a fungating infiltrative and polypoid partially obstructing large mass in the sigmoid colon between 20-25 cm from the anal verge.  This was consistent with colon cancer. - He underwent sigmoid colon segmental resection on 03/02/2018 by Dr. Arnoldo Morale.  This showed invasive adenocarcinoma, well-differentiated, 3.8 cm, tumor invades through muscularis propria, resection margins negative, 14/35 lymph nodes positive, satellite nodules x3, PT 3, PN2B, MMR preserved. - CT of the chest on 02/26/2018 shows 2 small pulmonary nodules, 7 x 5 mm subpleural nodule in the right major fissure and the 4 mm left lower lobe nodule.  Mild hepatic steatosis was seen. - PET/CT scan on 03/30/2018 shows bilateral hypermetabolic pelvic sidewall lymph nodes, subcentimeter, not in the initial distribution pattern for sigmoid primary.  Lymph nodes are in the bilateral external iliac and right inguinal lymph node (7 mm).  They are most likely reactive.  A pelvic CT in 3 months is recommended. -Given his normal CEA level, I do not believe they are metastatic.  - He had port placed by Dr. Arnoldo Morale. -We talked about side effects of FOLFOX regimen in detail. -I have reviewed his blood work today.  LFTs were mildly elevated.  This is likely from fatty liver. - We will proceed with full dose FOLFOX regimen today. -I will see him back in 2 weeks for follow-up for his second cycle.  2.  Family history: -2 maternal aunts had breast cancer.  No family history of colorectal cancer. - I have also recommended genetic testing because of his young age at diagnosis.

## 2018-04-21 NOTE — Progress Notes (Signed)
0930 labs reviewed and pt seen by Dr. Delton Coombes who approved pt for treatment today. AST of 49 and ALT of 87 reviewed with Dr. Delton Coombes. He states we will continue to monitor this but to proceed with chemo today. Pt education done and chemotherapy education packet given to pt. Consent obtained.   Joel Jefferson. tolerated folfox infusions without incident or complaint. VSS. 5FU infusing via home infusion pump without difficulties. Discharged self ambulatory in satisfactory condition in presence of girlfriend.

## 2018-04-21 NOTE — Patient Instructions (Signed)
[place] [facility] Discharge Instructions  You were seen today by Dr. Delton Coombes he went over your recent lab results. He also reviewed side effects of your treatment with you. We will see you back in 2 weeks for labs , follow up and treatment.    Thank you for choosing [place] at [facility] to provide your oncology and hematology care.  To afford each patient quality time with our provider, please arrive at least 15 minutes before your scheduled appointment time.   If you have a lab appointment with the [facility] please come in thru the  Main Entrance and check in at the main information desk  You need to re-schedule your appointment should you arrive 10 or more minutes late.  We strive to give you quality time with our providers, and arriving late affects you and other patients whose appointments are after yours.  Also, if you no show three or more times for appointments you may be dismissed from the clinic at the providers discretion.     Again, thank you for choosing [facility].  Our hope is that these requests will decrease the amount of time that you wait before being seen by our physicians.       _____________________________________________________________  Should you have questions after your visit to [facility], please contact our office at [phone] between the hours of 8:00 a.m. and 4:30 p.m.  Voicemails left after 4:00 p.m. will not be returned until the following business day.  For prescription refill requests, have your pharmacy contact our office and allow 72 hours.    Cancer Center Support Programs:   > Cancer Support Group  2nd Tuesday of the month 1pm-2pm, Journey Room

## 2018-04-21 NOTE — Progress Notes (Signed)
Joel Jefferson, [place] [address]   CLINIC:  Medical Oncology/Hematology  PCP:  Sharilyn Sites, MD [address] [place] [address] [phone]   REASON FOR VISIT: Follow-up for stage III colon cancer  CURRENT THERAPY: Folfox every 2 weeks  BRIEF ONCOLOGIC HISTORY:    Malignant neoplasm of sigmoid colon [facility])    Initial Diagnosis    Cancer of sigmoid colon ([place])    03/27/2018 Genetic Testing    ALK c.350C>G VUS identified on the multicancer panel.  The Multi-Gene Panel offered by [facility] includes sequencing and/or deletion duplication testing of the following 85 genes: AIP, ALK, APC, ATM, AXIN2,BAP1,  BARD1, BLM, BMPR1A, BRCA1, BRCA2, BRIP1, CASR, CDC73, CDH1, CDK4, CDKN1B, CDKN1C, CDKN2A (p14ARF), CDKN2A (p16INK4a), CEBPA, CHEK2, CTNNA1, DICER1, DIS3L2, EGFR (c.2369C>T, p.Thr790Met variant only), EPCAM (Deletion/duplication testing only), FH, FLCN, GATA2, GPC3, GREM1 (Promoter region deletion/duplication testing only), HOXB13 (c.251G>A, p.Gly84Glu), HRAS, KIT, MAX, MEN1, MET, MITF (c.952G>A, p.Glu318Lys variant only), MLH1, MSH2, MSH3, MSH6, MUTYH, NBN, NF1, NF2, NTHL1, PALB2, PDGFRA, PHOX2B, PMS2, POLD1, POLE, POT1, PRKAR1A, PTCH1, PTEN, RAD50, RAD51C, RAD51D, RB1, RECQL4, RET, RNF43, RUNX1, SDHAF2, SDHA (sequence changes only), SDHB, SDHC, SDHD, SMAD4, SMARCA4, SMARCB1, SMARCE1, STK11, SUFU, TERC, TERT, TMEM127, TP53, TSC1, TSC2, VHL, WRN and WT1.   The report date is 03/27/2018.    04/21/2018 -  Chemotherapy    The patient had palonosetron (ALOXI) injection 0.25 mg, 0.25 mg, Intravenous,  Once, 1 of 12 cycles Administration: 0.25 mg (04/21/2018) leucovorin 800 mg in dextrose 5 % 250 mL infusion, 876 mg, Intravenous,  Once, 1 of 12 cycles Administration: 800 mg (04/21/2018) oxaliplatin (ELOXATIN) 185 mg in dextrose 5 % 500 mL chemo infusion, 85 mg/m2 = 185 mg, Intravenous,  Once, 1 of 12 cycles Administration: 185 mg  (04/21/2018) fluorouracil (ADRUCIL) chemo injection 900 mg, 400 mg/m2 = 900 mg, Intravenous,  Once, 1 of 12 cycles Administration: 900 mg (04/21/2018) fluorouracil (ADRUCIL) 5,250 mg in sodium chloride 0.9 % 145 mL chemo infusion, 2,400 mg/m2 = 5,250 mg, Intravenous, 1 Day/Dose, 1 of 12 cycles Administration: 5,250 mg (04/21/2018)  for chemotherapy treatment.      INTERVAL HISTORY:  Joel Jefferson 43 y.o. male returns for routine follow-up for colon cancer. He is here today with his wife for his first treatment. He has no complaints this time and is ready for his treatment. Denies any nausea, vomiting, or diarrhea. Denies any new pains. Had not noticed any recent bleeding such as epistaxis, hematuria or hematochezia. Denies recent chest pain on exertion, shortness of breath on minimal exertion, pre-syncopal episodes, or palpitations. Denies any numbness or tingling in hands or feet. Denies any recent fevers, infections, or recent hospitalizations. He reports his appetite and energy level at 100%.   REVIEW OF SYSTEMS:  Review of Systems  All other systems reviewed and are negative.    PAST MEDICAL/SURGICAL HISTORY:  Past Medical History:  Diagnosis Date  . Family history of breast cancer   . GERD (gastroesophageal reflux disease)   . History of kidney stones   . Hypercholesterolemia   . Hypertension   . Urethral stricture    Past Surgical History:  Procedure Laterality Date  . BALLOON DILATION  04/06/2012   Procedure: BALLOON DILATION;  Surgeon: Reece Packer, MD;  Location: [facility];  Service: Urology;  Laterality: N/A;  . BIOPSY  02/24/2018   Procedure: BIOPSY;  Surgeon: Danie Binder, MD;  [facility];  Service: Endoscopy;;  colon  .  COLONOSCOPY WITH PROPOFOL N/A 02/24/2018   Procedure: COLONOSCOPY WITH PROPOFOL;  Surgeon: Danie Binder, MD;  [facility];  Service: Endoscopy;  Laterality: N/A;  9:30am  . ESOPHAGOGASTRODUODENOSCOPY  (EGD) WITH PROPOFOL N/A 08/29/2015   Dr. Oneida Alar: possible proximal esophageal web s/p dilation, moderate gastritis, negative eosinophilic esophagitis   . PARTIAL COLECTOMY N/A 03/02/2018   Procedure: PARTIAL COLECTOMY;  Surgeon: Aviva Signs, MD;  Location: [facility];  Service: General;  Laterality: N/A;  . PORTACATH PLACEMENT Left 04/13/2018   Procedure: INSERTION PORT-A-CATH;  Surgeon: Aviva Signs, MD;  Location: [facility];  Service: General;  Laterality: Left;  . SAVORY DILATION N/A 08/29/2015   Procedure: SAVORY DILATION;  Surgeon: Danie Binder, MD;  [facility];  Service: Endoscopy;  Laterality: N/A;  . URETHRAL DILATION    . WISDOM TOOTH EXTRACTION       SOCIAL HISTORY:  Social History   Socioeconomic History  . Marital status: Divorced    Spouse name: Not on file  . Number of children: Not on file  . Years of education: Not on file  . Highest education level: Not on file  Occupational History  . Not on file  Social Needs  . Financial resource strain: Not on file  . Food insecurity:    Worry: Not on file    Inability: Not on file  . Transportation needs:    Medical: Not on file    Non-medical: Not on file  Tobacco Use  . Smoking status: Former Smoker    Packs/day: 0.25    Years: 9.00    Pack years: 2.25    Types: Cigarettes    Last attempt to quit: 07/27/2011    Years since quitting: 6.7  . Smokeless tobacco: Never Used  Substance and Sexual Activity  . Alcohol use: Yes    Alcohol/week: 6.0 standard drinks    Types: 6 Cans of beer per week  . Drug use: No  . Sexual activity: Yes    Birth control/protection: None  Lifestyle  . Physical activity:    Days per week: Not on file    Minutes per session: Not on file  . Stress: Not on file  Relationships  . Social connections:    Talks on phone: Not on file    Gets together: Not on file    Attends religious service: Not on file    Active member of club or organization: Not on file    Attends meetings of  clubs or organizations: Not on file    Relationship status: Not on file  . Intimate partner violence:    Fear of current or ex partner: Not on file    Emotionally abused: Not on file    Physically abused: Not on file    Forced sexual activity: Not on file  Other Topics Concern  . Not on file  Social History Narrative  . Not on file    FAMILY HISTORY:  Family History  Problem Relation Age of Onset  . Dementia Mother   . Dementia Maternal Aunt   . Dementia Maternal Grandmother   . COPD Maternal Grandfather   . Breast cancer Maternal Aunt 42  . Breast cancer Maternal Aunt        dx in her 41s  . Lung cancer Maternal Aunt   . Healthy Son   . Colon cancer Neg Hx     CURRENT MEDICATIONS:  Outpatient Encounter Medications as of 04/21/2018  Medication Sig Note  .  esomeprazole (NEXIUM) 40 MG capsule Take 40 mg by mouth daily.    . fluorouracil CALGB 77412 in sodium chloride 0.9 % 150 mL Inject 6,150 mg into the vein every 14 (fourteen) days. Over 46 hours   . LEUCOVORIN CALCIUM IV Inject 876 mg into the vein every 14 (fourteen) days.   Marland Kitchen lidocaine-prilocaine (EMLA) cream Apply small amount to port site one hour prior to appointment. Cover with plastic wrap   . lisinopril (PRINIVIL,ZESTRIL) 10 MG tablet Take 10 mg by mouth daily.    . Magnesium 500 MG CAPS Take 500 mg by mouth daily.    . ondansetron (ZOFRAN) 4 MG tablet Take 1 tablet (4 mg total) by mouth every 8 (eight) hours as needed for nausea or vomiting. (Patient not taking: Reported on 04/21/2018) 04/06/2018: Not started yet  . OXALIPLATIN IV Inject 185 mg into the vein every 14 (fourteen) days.   . Potassium (POTASSIMIN PO) Take 1 tablet by mouth daily.    . pravastatin (PRAVACHOL) 20 MG tablet Take 20 mg by mouth daily.    . prochlorperazine (COMPAZINE) 10 MG tablet Take 1 tablet (10 mg total) by mouth every 6 (six) hours as needed (Nausea or vomiting). (Patient not taking: Reported on 04/21/2018)   . topiramate (TOPAMAX) 100  MG tablet Take 100 mg by mouth 2 (two) times daily.    . traMADol (ULTRAM) 50 MG tablet Take 1 tablet (50 mg total) by mouth every 6 (six) hours as needed. (Patient taking differently: Take 50 mg by mouth every 6 (six) hours as needed for moderate pain. )   . [DISCONTINUED] tamsulosin (FLOMAX) 0.4 MG CAPS capsule Take 1 capsule (0.4 mg total) by mouth daily after breakfast. (Patient not taking: Reported on 04/06/2018)    No facility-administered encounter medications on file as of 04/21/2018.     ALLERGIES:  No Known Allergies   PHYSICAL EXAM:  ECOG Performance status: 1  Vital Signs: BP: 131/82, P:71, R:16, T:98.5, O2:99% WEIGHT:220  Physical Exam Constitutional:      Appearance: Normal appearance. He is normal weight.  Musculoskeletal: Normal range of motion.  Skin:    General: Skin is warm and dry.  Neurological:     Mental Status: He is alert and oriented to person, place, and time. Mental status is at baseline.  Psychiatric:        Mood and Affect: Mood normal.        Behavior: Behavior normal.        Thought Content: Thought content normal.        Judgment: Judgment normal.      LABORATORY DATA:  I have reviewed the labs as listed.  CBC    Component Value Date/Time   WBC 6.8 04/21/2018 0818   RBC 4.97 04/21/2018 0818   HGB 14.1 04/21/2018 0818   HCT 43.5 04/21/2018 0818   PLT 168 04/21/2018 0818   MCV 87.5 04/21/2018 0818   MCH 28.4 04/21/2018 0818   MCHC 32.4 04/21/2018 0818   RDW 14.7 04/21/2018 0818   LYMPHSABS 1.8 04/21/2018 0818   MONOABS 0.4 04/21/2018 0818   EOSABS 0.3 04/21/2018 0818   BASOSABS 0.1 04/21/2018 0818   CMP Latest Ref Rng & Units 04/21/2018 04/06/2018 03/05/2018  Glucose 70 - 99 mg/dL 158(H) 125(H) 119(H)  BUN 6 - 20 mg/dL '18 15 16  ' Creatinine 0.61 - 1.24 mg/dL 1.17 0.86 1.77(H)  Sodium 135 - 145 mmol/L 139 140 139  Potassium 3.5 - 5.1 mmol/L 4.0 4.0 3.8  Chloride  98 - 111 mmol/L 111 113(H) 112(H)  CO2 22 - 32 mmol/L 21(L) 20(L)  21(L)  Calcium 8.9 - 10.3 mg/dL 9.0 8.9 8.2(L)  Total Protein 6.5 - 8.1 g/dL 6.7 7.0 -  Total Bilirubin 0.3 - 1.2 mg/dL 0.7 0.4 -  Alkaline Phos 38 - 126 U/L 46 49 -  AST 15 - 41 U/L 49(H) 29 -  ALT 0 - 44 U/L 87(H) 45(H) -       DIAGNOSTIC IMAGING:  I have independently reviewed the scans and discussed with the patient.   I have reviewed Francene Finders, NP's note and agree with the documentation.  I personally performed a face-to-face visit, made revisions and my assessment and plan is as follows.    ASSESSMENT & PLAN:   Malignant neoplasm of sigmoid colon (HCC) 1.  Stage III (pT3 pN2b) sigmoid colon adenocarcinoma, MSI-stable: - Patient had left lower quadrant abdominal pain since October, CT scan done at [facility] of the abdomen pelvis showed sigmoid colon thickening. -he underwent colonoscopy on 03/06/2018 showing a fungating infiltrative and polypoid partially obstructing large mass in the sigmoid colon between 20-25 cm from the anal verge.  This was consistent with colon cancer. - He underwent sigmoid colon segmental resection on 03/02/2018 by Dr. Arnoldo Morale.  This showed invasive adenocarcinoma, well-differentiated, 3.8 cm, tumor invades through muscularis propria, resection margins negative, 14/35 lymph nodes positive, satellite nodules x3, PT 3, PN2B, MMR preserved. - CT of the chest on 02/26/2018 shows 2 small pulmonary nodules, 7 x 5 mm subpleural nodule in the right major fissure and the 4 mm left lower lobe nodule.  Mild hepatic steatosis was seen. - PET/CT scan on 03/30/2018 shows bilateral hypermetabolic pelvic sidewall lymph nodes, subcentimeter, not in the initial distribution pattern for sigmoid primary.  Lymph nodes are in the bilateral external iliac and right inguinal lymph node (7 mm).  They are most likely reactive.  A pelvic CT in 3 months is recommended. -Given his normal CEA level, I do not believe they are metastatic.  - He had port placed by Dr. Arnoldo Morale. -We talked  about side effects of FOLFOX regimen in detail. -I have reviewed his blood work today.  LFTs were mildly elevated.  This is likely from fatty liver. - We will proceed with full dose FOLFOX regimen today. -I will see him back in 2 weeks for follow-up for his second cycle.  2.  Family history: -2 maternal aunts had breast cancer.  No family history of colorectal cancer. - I have also recommended genetic testing because of his young age at diagnosis.      Orders placed this encounter:  Orders Placed This Encounter  Procedures  . Magnesium  . CBC with Differential/Platelet  . Comprehensive metabolic panel  . CEA      Derek Jack, MD [place] [phone]

## 2018-04-21 NOTE — Patient Instructions (Signed)
[facility] Discharge Instructions for Patients Receiving Chemotherapy   Beginning January 23rd 2017 lab work for the Cancer Center will be done in the  Main lab at [facility] on 1st floor. If you have a lab appointment with the Cancer Center please come in thru the  Main Entrance and check in at the main information desk   Today you received the following chemotherapy agents Oxaliplatin, Leucovorin, and 5FU  To help prevent nausea and vomiting after your treatment, we encourage you to take your nausea medication    If you develop nausea and vomiting, or diarrhea that is not controlled by your medication, call the clinic.  The clinic phone number is [phone]. Office hours are Monday-Friday 8:30am-5:00pm.  BELOW ARE SYMPTOMS THAT SHOULD BE REPORTED IMMEDIATELY:  *FEVER GREATER THAN 101.0 F  *CHILLS WITH OR WITHOUT FEVER  NAUSEA AND VOMITING THAT IS NOT CONTROLLED WITH YOUR NAUSEA MEDICATION  *UNUSUAL SHORTNESS OF BREATH  *UNUSUAL BRUISING OR BLEEDING  TENDERNESS IN MOUTH AND THROAT WITH OR WITHOUT PRESENCE OF ULCERS  *URINARY PROBLEMS  *BOWEL PROBLEMS  UNUSUAL RASH Items with * indicate a potential emergency and should be followed up as soon as possible. If you have an emergency after office hours please contact your primary care physician or go to the nearest emergency department.  Please call the clinic during office hours if you have any questions or concerns.   You may also contact the Patient Navigator at [phone] should you have any questions or need assistance in obtaining follow up care.      Resources For Cancer Patients and their Caregivers ? American Cancer Society: Can assist with transportation, wigs, general needs, runs Look Good Feel Better.        1-888-227-6333 ? Cancer Care: Provides financial assistance, online support groups, medication/co-pay assistance.  1-800-813-HOPE (4673) ? [facility] Assists [place] cancer patients and their families through emotional , educational and financial support.  336-427-4357 ? [place] DSS Where to apply for food stamps, Medicaid and utility assistance. 336-342-1394 ? [facility]: Transportation to medical appointments. 336-347-2287 ? Social Security Administration: May apply for disability if have a Stage IV cancer. 336-342-7796 1-800-772-1213 ? [place] Aging, Disability and Transit Services: Assists with nutrition, care and transit needs. 336-349-2343          

## 2018-04-22 MED ORDER — DEXAMETHASONE SODIUM PHOSPHATE 10 MG/ML IJ SOLN
INTRAMUSCULAR | Status: AC
  Filled 2018-04-22: qty 1

## 2018-04-23 ENCOUNTER — Encounter (HOSPITAL_COMMUNITY): Payer: Self-pay

## 2018-04-23 ENCOUNTER — Other Ambulatory Visit (HOSPITAL_COMMUNITY): Payer: Self-pay | Admitting: Hematology

## 2018-04-23 ENCOUNTER — Inpatient Hospital Stay (HOSPITAL_COMMUNITY): Payer: BLUE CROSS/BLUE SHIELD

## 2018-04-23 VITALS — BP 134/74 | HR 87 | Temp 98.2°F | Resp 18

## 2018-04-23 DIAGNOSIS — C187 Malignant neoplasm of sigmoid colon: Secondary | ICD-10-CM

## 2018-04-23 DIAGNOSIS — Z452 Encounter for adjustment and management of vascular access device: Secondary | ICD-10-CM | POA: Diagnosis not present

## 2018-04-23 DIAGNOSIS — R110 Nausea: Secondary | ICD-10-CM | POA: Diagnosis not present

## 2018-04-23 DIAGNOSIS — Z5111 Encounter for antineoplastic chemotherapy: Secondary | ICD-10-CM | POA: Diagnosis not present

## 2018-04-23 MED ORDER — HEPARIN SOD (PORK) LOCK FLUSH 100 UNIT/ML IV SOLN
500.0000 [IU] | Freq: Once | INTRAVENOUS | Status: AC | PRN
  Administered 2018-04-23: 500 [IU]

## 2018-04-23 MED ORDER — SODIUM CHLORIDE 0.9% FLUSH
10.0000 mL | INTRAVENOUS | Status: DC | PRN
  Administered 2018-04-23: 10 mL
  Filled 2018-04-23: qty 10

## 2018-04-23 NOTE — Progress Notes (Signed)
Joel Jefferson. tolerated 5FU pump well without complaints or incident. 5FU pump discontinued with portacath flushed with 10 ml NS and 5 ml Heparin easily per protocol then de-accessed. VSS Pt discharged self ambulatory in satisfactory condition

## 2018-04-23 NOTE — Patient Instructions (Signed)
[place] at [facility] Discharge Instructions  5FU pump discontinued today. Follow-up as scheduled. Call clinic for any questions or concerns   Thank you for choosing [place] at [facility] to provide your oncology and hematology care.  To afford each patient quality time with our provider, please arrive at least 15 minutes before your scheduled appointment time.   If you have a lab appointment with the [place] please come in thru the  Main Entrance and check in at the main information desk  You need to re-schedule your appointment should you arrive 10 or more minutes late.  We strive to give you quality time with our providers, and arriving late affects you and other patients whose appointments are after yours.  Also, if you no show three or more times for appointments you may be dismissed from the clinic at the providers discretion.     Again, thank you for choosing [facility].  Our hope is that these requests will decrease the amount of time that you wait before being seen by our physicians.       _____________________________________________________________  Should you have questions after your visit to [facility], please contact our office at [phone] between the hours of 8:00 a.m. and 4:30 p.m.  Voicemails left after 4:00 p.m. will not be returned until the following business day.  For prescription refill requests, have your pharmacy contact our office and allow 72 hours.    Cancer Center Support Programs:   > Cancer Support Group  2nd Tuesday of the month 1pm-2pm, Journey Room

## 2018-04-24 ENCOUNTER — Telehealth (HOSPITAL_COMMUNITY): Payer: Self-pay | Admitting: *Deleted

## 2018-04-24 NOTE — Telephone Encounter (Signed)
I spoke to Dr. Delton Coombes about the pt's nausea and nausea medications. Dr. Raliegh Ip advised for the pt to continue taking the Compazine every 6 hours and add the Zofran every 4 hours for nausea. I also advised the pt to continue eating and pushing his fluids to help prevent dehydration. The pt verbalized understanding.

## 2018-04-24 NOTE — Telephone Encounter (Signed)
Returned call and pt states that he stays nauseated. Pt states that he takes his compazine around the clock, every 6 hours and has taken the zofran a few times. Pt states that he hasn't vomited but just feels nauseated. Pt states that he also has a metal taste in his mouth. Pt wants to know if there is something different he can take or do for his nausea. I advised the pt that I would discuss above with Dr. Delton Coombes and call him back with his recommendations.

## 2018-04-27 ENCOUNTER — Other Ambulatory Visit (HOSPITAL_COMMUNITY): Payer: Self-pay | Admitting: Hematology

## 2018-04-27 DIAGNOSIS — C187 Malignant neoplasm of sigmoid colon: Secondary | ICD-10-CM

## 2018-04-28 ENCOUNTER — Telehealth: Payer: Self-pay | Admitting: General Practice

## 2018-04-28 ENCOUNTER — Other Ambulatory Visit (HOSPITAL_COMMUNITY): Payer: Self-pay

## 2018-04-28 ENCOUNTER — Ambulatory Visit (HOSPITAL_COMMUNITY): Payer: Self-pay

## 2018-04-28 NOTE — Telephone Encounter (Signed)
[place] CSW Progress Note  Called patient to assess for needs/resources after first treatment.  Unable to reach patient but left VM w information about availability of social worker, upcoming cooking class for oncology patients and caregivers as well as monthly cancer support group.  Encouraged him to call as needed for support/resources.  Edwyna Shell, LCSW Clinical Social Worker Phone:  [phone]

## 2018-04-30 ENCOUNTER — Encounter (HOSPITAL_COMMUNITY): Payer: Self-pay

## 2018-05-04 ENCOUNTER — Other Ambulatory Visit (HOSPITAL_COMMUNITY): Payer: Self-pay | Admitting: *Deleted

## 2018-05-05 ENCOUNTER — Inpatient Hospital Stay (HOSPITAL_COMMUNITY): Payer: BLUE CROSS/BLUE SHIELD

## 2018-05-05 ENCOUNTER — Inpatient Hospital Stay (HOSPITAL_BASED_OUTPATIENT_CLINIC_OR_DEPARTMENT_OTHER): Payer: BLUE CROSS/BLUE SHIELD | Admitting: Hematology

## 2018-05-05 ENCOUNTER — Other Ambulatory Visit (HOSPITAL_COMMUNITY): Payer: Self-pay

## 2018-05-05 ENCOUNTER — Encounter (HOSPITAL_COMMUNITY): Payer: Self-pay | Admitting: Hematology

## 2018-05-05 ENCOUNTER — Ambulatory Visit (HOSPITAL_COMMUNITY): Payer: Self-pay

## 2018-05-05 VITALS — BP 129/84 | HR 69 | Temp 98.0°F | Resp 18

## 2018-05-05 VITALS — BP 122/83 | HR 71 | Temp 98.0°F | Resp 16 | Wt 218.0 lb

## 2018-05-05 DIAGNOSIS — R110 Nausea: Secondary | ICD-10-CM

## 2018-05-05 DIAGNOSIS — Z452 Encounter for adjustment and management of vascular access device: Secondary | ICD-10-CM | POA: Diagnosis not present

## 2018-05-05 DIAGNOSIS — Z5111 Encounter for antineoplastic chemotherapy: Secondary | ICD-10-CM | POA: Diagnosis not present

## 2018-05-05 DIAGNOSIS — C187 Malignant neoplasm of sigmoid colon: Secondary | ICD-10-CM

## 2018-05-05 LAB — CBC WITH DIFFERENTIAL/PLATELET
Abs Immature Granulocytes: 0.01 10*3/uL (ref 0.00–0.07)
Basophils Absolute: 0 10*3/uL (ref 0.0–0.1)
Basophils Relative: 1 %
Eosinophils Absolute: 0.2 10*3/uL (ref 0.0–0.5)
Eosinophils Relative: 3 %
HEMATOCRIT: 41.5 % (ref 39.0–52.0)
Hemoglobin: 13.9 g/dL (ref 13.0–17.0)
Immature Granulocytes: 0 %
LYMPHS ABS: 1.6 10*3/uL (ref 0.7–4.0)
Lymphocytes Relative: 28 %
MCH: 28.8 pg (ref 26.0–34.0)
MCHC: 33.5 g/dL (ref 30.0–36.0)
MCV: 85.9 fL (ref 80.0–100.0)
MONOS PCT: 5 %
Monocytes Absolute: 0.3 10*3/uL (ref 0.1–1.0)
Neutro Abs: 3.5 10*3/uL (ref 1.7–7.7)
Neutrophils Relative %: 63 %
Platelets: 186 10*3/uL (ref 150–400)
RBC: 4.83 MIL/uL (ref 4.22–5.81)
RDW: 14.4 % (ref 11.5–15.5)
WBC: 5.5 10*3/uL (ref 4.0–10.5)
nRBC: 0 % (ref 0.0–0.2)

## 2018-05-05 LAB — COMPREHENSIVE METABOLIC PANEL
ALT: 53 U/L — ABNORMAL HIGH (ref 0–44)
AST: 31 U/L (ref 15–41)
Albumin: 4 g/dL (ref 3.5–5.0)
Alkaline Phosphatase: 50 U/L (ref 38–126)
Anion gap: 5 (ref 5–15)
BILIRUBIN TOTAL: 0.4 mg/dL (ref 0.3–1.2)
BUN: 14 mg/dL (ref 6–20)
CO2: 22 mmol/L (ref 22–32)
CREATININE: 0.91 mg/dL (ref 0.61–1.24)
Calcium: 8.9 mg/dL (ref 8.9–10.3)
Chloride: 112 mmol/L — ABNORMAL HIGH (ref 98–111)
GFR calc Af Amer: >60 mL/min (ref >60)
GFR calc non Af Amer: >60 mL/min (ref >60)
Glucose, Bld: 171 mg/dL — ABNORMAL HIGH (ref 70–99)
Potassium: 3.9 mmol/L (ref 3.5–5.1)
Sodium: 139 mmol/L (ref 135–145)
Total Protein: 6.8 g/dL (ref 6.5–8.1)

## 2018-05-05 MED ORDER — OXALIPLATIN CHEMO INJECTION 100 MG/20ML
85.0000 mg/m2 | Freq: Once | INTRAVENOUS | Status: AC
  Administered 2018-05-05: 185 mg via INTRAVENOUS
  Filled 2018-05-05: qty 37

## 2018-05-05 MED ORDER — PALONOSETRON HCL INJECTION 0.25 MG/5ML
0.2500 mg | Freq: Once | INTRAVENOUS | Status: AC
  Administered 2018-05-05: 0.25 mg via INTRAVENOUS
  Filled 2018-05-05: qty 5

## 2018-05-05 MED ORDER — FLUOROURACIL CHEMO INJECTION 2.5 GM/50ML
400.0000 mg/m2 | Freq: Once | INTRAVENOUS | Status: AC
  Administered 2018-05-05: 900 mg via INTRAVENOUS
  Filled 2018-05-05: qty 18

## 2018-05-05 MED ORDER — SODIUM CHLORIDE 0.9% FLUSH
10.0000 mL | INTRAVENOUS | Status: DC | PRN
  Administered 2018-05-05: 10 mL
  Filled 2018-05-05: qty 10

## 2018-05-05 MED ORDER — DEXTROSE 5 % IV SOLN
Freq: Once | INTRAVENOUS | Status: AC
  Administered 2018-05-05: 10:00:00 via INTRAVENOUS

## 2018-05-05 MED ORDER — SODIUM CHLORIDE 0.9 % IV SOLN
2400.0000 mg/m2 | INTRAVENOUS | Status: DC
  Administered 2018-05-05: 5250 mg via INTRAVENOUS
  Filled 2018-05-05: qty 100

## 2018-05-05 MED ORDER — LEUCOVORIN CALCIUM INJECTION 350 MG
900.0000 mg | Freq: Once | INTRAVENOUS | Status: AC
  Administered 2018-05-05: 900 mg via INTRAVENOUS
  Filled 2018-05-05: qty 10

## 2018-05-05 MED ORDER — SODIUM CHLORIDE 0.9 % IV SOLN
10.0000 mg | Freq: Once | INTRAVENOUS | Status: AC
  Administered 2018-05-05: 10 mg via INTRAVENOUS
  Filled 2018-05-05: qty 1

## 2018-05-05 NOTE — Patient Instructions (Signed)
[facility] Discharge Instructions for Patients Receiving Chemotherapy  Today you received the following chemotherapy agents  If you develop nausea and vomiting that is not controlled by your nausea medication, call the clinic.   BELOW ARE SYMPTOMS THAT SHOULD BE REPORTED IMMEDIATELY:  *FEVER GREATER THAN 100.5 F  *CHILLS WITH OR WITHOUT FEVER  NAUSEA AND VOMITING THAT IS NOT CONTROLLED WITH YOUR NAUSEA MEDICATION  *UNUSUAL SHORTNESS OF BREATH  *UNUSUAL BRUISING OR BLEEDING  TENDERNESS IN MOUTH AND THROAT WITH OR WITHOUT PRESENCE OF ULCERS  *URINARY PROBLEMS  *BOWEL PROBLEMS  UNUSUAL RASH Items with * indicate a potential emergency and should be followed up as soon as possible.  Feel free to call the clinic should you have any questions or concerns. The clinic phone number is [phone].  Please show the CHEMO ALERT CARD at check-in to the Emergency Department and triage nurse.   

## 2018-05-05 NOTE — Assessment & Plan Note (Signed)
1.  Stage III (pT3 pN2b) sigmoid colon adenocarcinoma, MSI-stable: - Patient had left lower quadrant abdominal pain since October, CT scan done at [facility] of the abdomen pelvis showed sigmoid colon thickening. -he underwent colonoscopy on 03/06/2018 showing a fungating infiltrative and polypoid partially obstructing large mass in the sigmoid colon between 20-25 cm from the anal verge.  This was consistent with colon cancer. - He underwent sigmoid colon segmental resection on 03/02/2018 by Dr. Arnoldo Morale.  This showed invasive adenocarcinoma, well-differentiated, 3.8 cm, tumor invades through muscularis propria, resection margins negative, 14/35 lymph nodes positive, satellite nodules x3, PT 3, PN2B, MMR preserved. - CT of the chest on 02/26/2018 shows 2 small pulmonary nodules, 7 x 5 mm subpleural nodule in the right major fissure and the 4 mm left lower lobe nodule.  Mild hepatic steatosis was seen. - PET/CT scan on 03/30/2018 shows bilateral hypermetabolic pelvic sidewall lymph nodes, subcentimeter, not in the initial distribution pattern for sigmoid primary.  Lymph nodes are in the bilateral external iliac and right inguinal lymph node (7 mm).  They are most likely reactive.  A pelvic CT in 3 months is recommended. -Given his normal CEA level, I do not believe they are metastatic.  - First cycle of FOLFOX started on 04/21/2018. - He had slight nausea lasting 2 to 3 days after last treatment.  He had cold sensitivity lasting few days. -His energy levels have decreased by 40%.  He continued to work during the second week, 12-hour shifts. -We reviewed his blood work today. -He may proceed with cycle 2 of FOLFOX without any dose modifications.  He will come back in 2 weeks for his cycle 3.  I will see him back in 4 weeks for follow-up.  2.  Family history: -2 maternal aunts had breast cancer.  No family history of colorectal cancer. - I have also recommended genetic testing because of his young age at  diagnosis.

## 2018-05-05 NOTE — Progress Notes (Signed)
Patient is ready for treatment labs reviewed

## 2018-05-05 NOTE — Progress Notes (Signed)
Patient seen by Dr. Delton Coombes with lab review and ok to treat today.  Patient tolerated chemotherapy with no complaints voiced.  Port site clean and dry with no bruising or swelling noted at site.  Good blood return noted before and after administration of chemotherapy.  Chemotherapy pump connected with no alarms noted.  Dressing intact.    Patient left ambulatory with VSS and no s/s of distress noted.

## 2018-05-05 NOTE — Patient Instructions (Addendum)
[place] at [facility] Discharge Instructions   We will see you back in 4 weeks with labs. Continue your normal treatment schedule every 2 weeks with labs,   Thank you for choosing [place] at [facility] to provide your oncology and hematology care.  To afford each patient quality time with our provider, please arrive at least 15 minutes before your scheduled appointment time.   If you have a lab appointment with the [place] please come in thru the  Main Entrance and check in at the main information desk  You need to re-schedule your appointment should you arrive 10 or more minutes late.  We strive to give you quality time with our providers, and arriving late affects you and other patients whose appointments are after yours.  Also, if you no show three or more times for appointments you may be dismissed from the clinic at the providers discretion.     Again, thank you for choosing [facility].  Our hope is that these requests will decrease the amount of time that you wait before being seen by our physicians.       _____________________________________________________________  Should you have questions after your visit to [facility], please contact our office at [phone] between the hours of 8:00 a.m. and 4:30 p.m.  Voicemails left after 4:00 p.m. will not be returned until the following business day.  For prescription refill requests, have your pharmacy contact our office and allow 72 hours.    Cancer Center Support Programs:   > Cancer Support Group  2nd Tuesday of the month 1pm-2pm, Journey Room

## 2018-05-05 NOTE — Progress Notes (Signed)
Joel Jefferson, [place] [address]   CLINIC:  Medical Oncology/Hematology  PCP:  Sharilyn Sites, [address] [place] [address] [phone]   REASON FOR VISIT: Follow-up for stage III colon cancer  CURRENT THERAPY:Folfox every 2 weeks  BRIEF ONCOLOGIC HISTORY:    Malignant neoplasm of sigmoid colon [facility])    Initial Diagnosis    Cancer of sigmoid colon ([place])    03/27/2018 Genetic Testing    ALK c.350C>G VUS identified on the multicancer panel.  The Multi-Gene Panel offered by [facility] includes sequencing and/or deletion duplication testing of the following 85 genes: AIP, ALK, APC, ATM, AXIN2,BAP1,  BARD1, BLM, BMPR1A, BRCA1, BRCA2, BRIP1, CASR, CDC73, CDH1, CDK4, CDKN1B, CDKN1C, CDKN2A (p14ARF), CDKN2A (p16INK4a), CEBPA, CHEK2, CTNNA1, DICER1, DIS3L2, EGFR (c.2369C>T, p.Thr790Met variant only), EPCAM (Deletion/duplication testing only), FH, FLCN, GATA2, GPC3, GREM1 (Promoter region deletion/duplication testing only), HOXB13 (c.251G>A, p.Gly84Glu), HRAS, KIT, MAX, MEN1, MET, MITF (c.952G>A, p.Glu318Lys variant only), MLH1, MSH2, MSH3, MSH6, MUTYH, NBN, NF1, NF2, NTHL1, PALB2, PDGFRA, PHOX2B, PMS2, POLD1, POLE, POT1, PRKAR1A, PTCH1, PTEN, RAD50, RAD51C, RAD51D, RB1, RECQL4, RET, RNF43, RUNX1, SDHAF2, SDHA (sequence changes only), SDHB, SDHC, SDHD, SMAD4, SMARCA4, SMARCB1, SMARCE1, STK11, SUFU, TERC, TERT, TMEM127, TP53, TSC1, TSC2, VHL, WRN and WT1.   The report date is 03/27/2018.    04/21/2018 -  Chemotherapy    The patient had palonosetron (ALOXI) injection 0.25 mg, 0.25 mg, Intravenous,  Once, 2 of 12 cycles Administration: 0.25 mg (04/21/2018) leucovorin 800 mg in dextrose 5 % 250 mL infusion, 876 mg, Intravenous,  Once, 2 of 12 cycles Administration: 800 mg (04/21/2018) oxaliplatin (ELOXATIN) 185 mg in dextrose 5 % 500 mL chemo infusion, 85 mg/m2 = 185 mg, Intravenous,  Once, 2 of 12 cycles Administration: 185 mg  (04/21/2018) fluorouracil (ADRUCIL) chemo injection 900 mg, 400 mg/m2 = 900 mg, Intravenous,  Once, 2 of 12 cycles Administration: 900 mg (04/21/2018) fluorouracil (ADRUCIL) 5,250 mg in sodium chloride 0.9 % 145 mL chemo infusion, 2,400 mg/m2 = 5,250 mg, Intravenous, 1 Day/Dose, 2 of 12 cycles Administration: 5,250 mg (04/21/2018)  for chemotherapy treatment.      INTERVAL HISTORY:  Joel Jefferson 43 y.o. male returns for routine follow-up for colon cancer. He is here today and reports he did well with his last treatment. He did have nausea for 3 days after but it was manageable with medication. The week while he was on treatment his appetite was decreased due to the nausea and metallic taste in his mouth. This week has been better. He had slight soreness in his mouth but the biotene mouth wash worked well. Denies any vomiting or diarrhea. Denies any new pains. Had not noticed any recent bleeding such as epistaxis, hematuria or hematochezia. Denies recent chest pain on exertion, shortness of breath on minimal exertion, pre-syncopal episodes, or palpitations. Denies any numbness or tingling in hands or feet. Denies any recent fevers, infections, or recent hospitalizations. Patient reports appetite at 100% and energy level at 25%.   REVIEW OF SYSTEMS:  Review of Systems  Gastrointestinal: Positive for constipation, diarrhea and nausea.  Neurological: Positive for numbness.  All other systems reviewed and are negative.    PAST MEDICAL/SURGICAL HISTORY:  Past Medical History:  Diagnosis Date  . Family history of breast cancer   . GERD (gastroesophageal reflux disease)   . History of kidney stones   . Hypercholesterolemia   . Hypertension   . Urethral stricture    Past Surgical History:  Procedure  Laterality Date  . BALLOON DILATION  04/06/2012   Procedure: BALLOON DILATION;  Surgeon: Reece Packer, MD;  Location: [facility];  Service: Urology;  Laterality: N/A;  . BIOPSY   02/24/2018   Procedure: BIOPSY;  Surgeon: Danie Binder, MD;  [facility];  Service: Endoscopy;;  colon  . COLONOSCOPY WITH PROPOFOL N/A 02/24/2018   Procedure: COLONOSCOPY WITH PROPOFOL;  Surgeon: Danie Binder, MD;  [facility];  Service: Endoscopy;  Laterality: N/A;  9:30am  . ESOPHAGOGASTRODUODENOSCOPY (EGD) WITH PROPOFOL N/A 08/29/2015   Dr. Oneida Alar: possible proximal esophageal web s/p dilation, moderate gastritis, negative eosinophilic esophagitis   . PARTIAL COLECTOMY N/A 03/02/2018   Procedure: PARTIAL COLECTOMY;  Surgeon: Aviva Signs, MD;  Location: [facility];  Service: General;  Laterality: N/A;  . PORTACATH PLACEMENT Left 04/13/2018   Procedure: INSERTION PORT-A-CATH;  Surgeon: Aviva Signs, MD;  Location: [facility];  Service: General;  Laterality: Left;  . SAVORY DILATION N/A 08/29/2015   Procedure: SAVORY DILATION;  Surgeon: Danie Binder, MD;  [facility];  Service: Endoscopy;  Laterality: N/A;  . URETHRAL DILATION    . WISDOM TOOTH EXTRACTION       SOCIAL HISTORY:  Social History   Socioeconomic History  . Marital status: Divorced    Spouse name: Not on file  . Number of children: Not on file  . Years of education: Not on file  . Highest education level: Not on file  Occupational History  . Not on file  Social Needs  . Financial resource strain: Not on file  . Food insecurity:    Worry: Not on file    Inability: Not on file  . Transportation needs:    Medical: Not on file    Non-medical: Not on file  Tobacco Use  . Smoking status: Former Smoker    Packs/day: 0.25    Years: 9.00    Pack years: 2.25    Types: Cigarettes    Last attempt to quit: 07/27/2011    Years since quitting: 6.7  . Smokeless tobacco: Never Used  Substance and Sexual Activity  . Alcohol use: Yes    Alcohol/week: 6.0 standard drinks    Types: 6 Cans of beer per week  . Drug use: No  . Sexual activity: Yes    Birth control/protection: None  Lifestyle   . Physical activity:    Days per week: Not on file    Minutes per session: Not on file  . Stress: Not on file  Relationships  . Social connections:    Talks on phone: Not on file    Gets together: Not on file    Attends religious service: Not on file    Active member of club or organization: Not on file    Attends meetings of clubs or organizations: Not on file    Relationship status: Not on file  . Intimate partner violence:    Fear of current or ex partner: Not on file    Emotionally abused: Not on file    Physically abused: Not on file    Forced sexual activity: Not on file  Other Topics Concern  . Not on file  Social History Narrative  . Not on file    FAMILY HISTORY:  Family History  Problem Relation Age of Onset  . Dementia Mother   . Dementia Maternal Aunt   . Dementia Maternal Grandmother   . COPD Maternal Grandfather   . Breast  cancer Maternal Aunt 42  . Breast cancer Maternal Aunt        dx in her 3s  . Lung cancer Maternal Aunt   . Healthy Son   . Colon cancer Neg Hx     CURRENT MEDICATIONS:  Outpatient Encounter Medications as of 05/05/2018  Medication Sig Note  . Ascorbic Acid (VITAMIN C) 1000 MG tablet Take 1,000 mg by mouth daily.   Marland Kitchen esomeprazole (NEXIUM) 40 MG capsule Take 40 mg by mouth daily.    . fluorouracil CALGB 76811 in sodium chloride 0.9 % 150 mL Inject 6,150 mg into the vein every 14 (fourteen) days. Over 46 hours   . LEUCOVORIN CALCIUM IV Inject 876 mg into the vein every 14 (fourteen) days.   Marland Kitchen lidocaine-prilocaine (EMLA) cream Apply small amount to port site one hour prior to appointment. Cover with plastic wrap   . lisinopril (PRINIVIL,ZESTRIL) 10 MG tablet Take 10 mg by mouth daily.    . Magnesium 500 MG CAPS Take 500 mg by mouth daily.    . Multiple Vitamin (MULTIVITAMIN) tablet Take 1 tablet by mouth daily.   . ondansetron (ZOFRAN) 4 MG tablet Take 1 tablet (4 mg total) by mouth every 8 (eight) hours as needed for nausea or  vomiting. 04/06/2018: Not started yet  . OXALIPLATIN IV Inject 185 mg into the vein every 14 (fourteen) days.   . Potassium (POTASSIMIN PO) Take 1 tablet by mouth daily.    . pravastatin (PRAVACHOL) 20 MG tablet Take 20 mg by mouth daily.    . prochlorperazine (COMPAZINE) 10 MG tablet Take 1 tablet (10 mg total) by mouth every 6 (six) hours as needed (Nausea or vomiting).   . topiramate (TOPAMAX) 100 MG tablet Take 100 mg by mouth 2 (two) times daily.    . traMADol (ULTRAM) 50 MG tablet Take 1 tablet (50 mg total) by mouth every 6 (six) hours as needed. (Patient taking differently: Take 50 mg by mouth every 6 (six) hours as needed for moderate pain. )    No facility-administered encounter medications on file as of 05/05/2018.     ALLERGIES:  No Known Allergies   PHYSICAL EXAM:  ECOG Performance status: 1  Vitals:   05/05/18 0909  BP: 122/83  Pulse: 71  Resp: 16  Temp: 98 F (36.7 C)  SpO2: 99%   Filed Weights   05/05/18 0909  Weight: 218 lb (98.9 kg)    Physical Exam Constitutional:      Appearance: Normal appearance. He is normal weight.  Cardiovascular:     Rate and Rhythm: Normal rate and regular rhythm.     Heart sounds: Normal heart sounds.  Pulmonary:     Effort: Pulmonary effort is normal.     Breath sounds: Normal breath sounds.  Musculoskeletal: Normal range of motion.  Skin:    General: Skin is warm and dry.  Neurological:     Mental Status: He is alert and oriented to person, place, and time. Mental status is at baseline.  Psychiatric:        Mood and Affect: Mood normal.        Behavior: Behavior normal.        Thought Content: Thought content normal.        Judgment: Judgment normal.      LABORATORY DATA:  I have reviewed the labs as listed.  CBC    Component Value Date/Time   WBC 5.5 05/05/2018 0805   RBC 4.83 05/05/2018 0805   HGB  13.9 05/05/2018 0805   HCT 41.5 05/05/2018 0805   PLT 186 05/05/2018 0805   MCV 85.9 05/05/2018 0805   MCH  28.8 05/05/2018 0805   MCHC 33.5 05/05/2018 0805   RDW 14.4 05/05/2018 0805   LYMPHSABS 1.6 05/05/2018 0805   MONOABS 0.3 05/05/2018 0805   EOSABS 0.2 05/05/2018 0805   BASOSABS 0.0 05/05/2018 0805   CMP Latest Ref Rng & Units 05/05/2018 04/21/2018 04/06/2018  Glucose 70 - 99 mg/dL 171(H) 158(H) 125(H)  BUN 6 - 20 mg/dL '14 18 15  ' Creatinine 0.61 - 1.24 mg/dL 0.91 1.17 0.86  Sodium 135 - 145 mmol/L 139 139 140  Potassium 3.5 - 5.1 mmol/L 3.9 4.0 4.0  Chloride 98 - 111 mmol/L 112(H) 111 113(H)  CO2 22 - 32 mmol/L 22 21(L) 20(L)  Calcium 8.9 - 10.3 mg/dL 8.9 9.0 8.9  Total Protein 6.5 - 8.1 g/dL 6.8 6.7 7.0  Total Bilirubin 0.3 - 1.2 mg/dL 0.4 0.7 0.4  Alkaline Phos 38 - 126 U/L 50 46 49  AST 15 - 41 U/L 31 49(H) 29  ALT 0 - 44 U/L 53(H) 87(H) 45(H)       DIAGNOSTIC IMAGING:  I have independently reviewed the scans and discussed with the patient.   I have reviewed Francene Finders, NP's note and agree with the documentation.  I personally performed a face-to-face visit, made revisions and my assessment and plan is as follows.    ASSESSMENT & PLAN:   Malignant neoplasm of sigmoid colon (HCC) 1.  Stage III (pT3 pN2b) sigmoid colon adenocarcinoma, MSI-stable: - Patient had left lower quadrant abdominal pain since October, CT scan done at [facility] of the abdomen pelvis showed sigmoid colon thickening. -he underwent colonoscopy on 03/06/2018 showing a fungating infiltrative and polypoid partially obstructing large mass in the sigmoid colon between 20-25 cm from the anal verge.  This was consistent with colon cancer. - He underwent sigmoid colon segmental resection on 03/02/2018 by Dr. Arnoldo Morale.  This showed invasive adenocarcinoma, well-differentiated, 3.8 cm, tumor invades through muscularis propria, resection margins negative, 14/35 lymph nodes positive, satellite nodules x3, PT 3, PN2B, MMR preserved. - CT of the chest on 02/26/2018 shows 2 small pulmonary nodules, 7 x 5 mm subpleural  nodule in the right major fissure and the 4 mm left lower lobe nodule.  Mild hepatic steatosis was seen. - PET/CT scan on 03/30/2018 shows bilateral hypermetabolic pelvic sidewall lymph nodes, subcentimeter, not in the initial distribution pattern for sigmoid primary.  Lymph nodes are in the bilateral external iliac and right inguinal lymph node (7 mm).  They are most likely reactive.  A pelvic CT in 3 months is recommended. -Given his normal CEA level, I do not believe they are metastatic.  - First cycle of FOLFOX started on 04/21/2018. - He had slight nausea lasting 2 to 3 days after last treatment.  He had cold sensitivity lasting few days. -His energy levels have decreased by 40%.  He continued to work during the second week, 12-hour shifts. -We reviewed his blood work today. -He may proceed with cycle 2 of FOLFOX without any dose modifications.  He will come back in 2 weeks for his cycle 3.  I will see him back in 4 weeks for follow-up.  2.  Family history: -2 maternal aunts had breast cancer.  No family history of colorectal cancer. - I have also recommended genetic testing because of his young age at diagnosis.      Orders placed this encounter:  Orders Placed This Encounter  Procedures  . CEA      Derek Jack, MD [place] [phone]

## 2018-05-07 ENCOUNTER — Inpatient Hospital Stay (HOSPITAL_COMMUNITY): Payer: BLUE CROSS/BLUE SHIELD

## 2018-05-07 VITALS — BP 118/77 | HR 77 | Temp 98.2°F | Resp 18

## 2018-05-07 DIAGNOSIS — Z5111 Encounter for antineoplastic chemotherapy: Secondary | ICD-10-CM | POA: Diagnosis not present

## 2018-05-07 DIAGNOSIS — C187 Malignant neoplasm of sigmoid colon: Secondary | ICD-10-CM

## 2018-05-07 DIAGNOSIS — Z452 Encounter for adjustment and management of vascular access device: Secondary | ICD-10-CM | POA: Diagnosis not present

## 2018-05-07 DIAGNOSIS — R110 Nausea: Secondary | ICD-10-CM | POA: Diagnosis not present

## 2018-05-07 MED ORDER — SODIUM CHLORIDE 0.9% FLUSH
10.0000 mL | INTRAVENOUS | Status: DC | PRN
  Administered 2018-05-07: 10 mL
  Filled 2018-05-07: qty 10

## 2018-05-07 MED ORDER — HEPARIN SOD (PORK) LOCK FLUSH 100 UNIT/ML IV SOLN
500.0000 [IU] | Freq: Once | INTRAVENOUS | Status: AC | PRN
  Administered 2018-05-07: 500 [IU]

## 2018-05-07 NOTE — Progress Notes (Signed)
Joel Jefferson. returns today for port deaccess and flush after 46 hr continous infusion of 30fu. Tolerated infusion without problems. Portacath located left chest wall was  deaccessed and flushed with 40ml NS and 500U/23ml Heparin and needle removed intact.  Procedure without incident. Patient tolerated procedure well.

## 2018-05-07 NOTE — Patient Instructions (Signed)
[facility] _______________________________________________________________  Thank you for choosing [facility] at [facility] to provide your oncology and hematology care.  To afford each patient quality time with our providers, please arrive at least 15 minutes before your scheduled appointment.  You need to re-schedule your appointment if you arrive 10 or more minutes late.  We strive to give you quality time with our providers, and arriving late affects you and other patients whose appointments are after yours.  Also, if you no show three or more times for appointments you may be dismissed from the clinic.  Again, thank you for choosing [facility] at [facility]. Our hope is that these requests will allow you access to exceptional care and in a timely manner. _______________________________________________________________  If you have questions after your visit, please contact our office at [phone] between the hours of 8:30 a.m. and 5:00 p.m. Voicemails left after 4:30 p.m. will not be returned until the following business day. _______________________________________________________________  For prescription refill requests, have your pharmacy contact our office. _______________________________________________________________  Recommendations made by the consultant and any test results will be sent to your referring physician. _______________________________________________________________ 

## 2018-05-14 ENCOUNTER — Encounter (HOSPITAL_COMMUNITY): Payer: Self-pay | Admitting: *Deleted

## 2018-05-14 ENCOUNTER — Telehealth (HOSPITAL_COMMUNITY): Payer: Self-pay | Admitting: *Deleted

## 2018-05-14 NOTE — Telephone Encounter (Signed)
I spoke with the pt about letter for work and faxing that to his HR person.

## 2018-05-15 ENCOUNTER — Other Ambulatory Visit (HOSPITAL_COMMUNITY): Payer: Self-pay | Admitting: Hematology

## 2018-05-19 ENCOUNTER — Inpatient Hospital Stay (HOSPITAL_COMMUNITY): Payer: BLUE CROSS/BLUE SHIELD | Attending: Hematology

## 2018-05-19 ENCOUNTER — Inpatient Hospital Stay (HOSPITAL_COMMUNITY): Payer: BLUE CROSS/BLUE SHIELD

## 2018-05-19 ENCOUNTER — Encounter (HOSPITAL_COMMUNITY): Payer: Self-pay

## 2018-05-19 VITALS — BP 134/89 | HR 90 | Temp 98.8°F | Resp 18 | Wt 222.9 lb

## 2018-05-19 DIAGNOSIS — Z7289 Other problems related to lifestyle: Secondary | ICD-10-CM | POA: Insufficient documentation

## 2018-05-19 DIAGNOSIS — R918 Other nonspecific abnormal finding of lung field: Secondary | ICD-10-CM | POA: Diagnosis not present

## 2018-05-19 DIAGNOSIS — Z5111 Encounter for antineoplastic chemotherapy: Secondary | ICD-10-CM | POA: Insufficient documentation

## 2018-05-19 DIAGNOSIS — K760 Fatty (change of) liver, not elsewhere classified: Secondary | ICD-10-CM | POA: Insufficient documentation

## 2018-05-19 DIAGNOSIS — T451X5A Adverse effect of antineoplastic and immunosuppressive drugs, initial encounter: Secondary | ICD-10-CM | POA: Insufficient documentation

## 2018-05-19 DIAGNOSIS — R590 Localized enlarged lymph nodes: Secondary | ICD-10-CM | POA: Insufficient documentation

## 2018-05-19 DIAGNOSIS — C187 Malignant neoplasm of sigmoid colon: Secondary | ICD-10-CM | POA: Diagnosis not present

## 2018-05-19 DIAGNOSIS — Z87891 Personal history of nicotine dependence: Secondary | ICD-10-CM | POA: Diagnosis not present

## 2018-05-19 DIAGNOSIS — K521 Toxic gastroenteritis and colitis: Secondary | ICD-10-CM | POA: Insufficient documentation

## 2018-05-19 DIAGNOSIS — Z803 Family history of malignant neoplasm of breast: Secondary | ICD-10-CM | POA: Insufficient documentation

## 2018-05-19 LAB — CBC WITH DIFFERENTIAL/PLATELET
Abs Immature Granulocytes: 0.02 10*3/uL (ref 0.00–0.07)
Basophils Absolute: 0 10*3/uL (ref 0.0–0.1)
Basophils Relative: 1 %
Eosinophils Absolute: 0.2 10*3/uL (ref 0.0–0.5)
Eosinophils Relative: 3 %
HCT: 40.7 % (ref 39.0–52.0)
Hemoglobin: 13.9 g/dL (ref 13.0–17.0)
IMMATURE GRANULOCYTES: 0 %
Lymphocytes Relative: 29 %
Lymphs Abs: 1.7 10*3/uL (ref 0.7–4.0)
MCH: 29 pg (ref 26.0–34.0)
MCHC: 34.2 g/dL (ref 30.0–36.0)
MCV: 85 fL (ref 80.0–100.0)
Monocytes Absolute: 0.4 10*3/uL (ref 0.1–1.0)
Monocytes Relative: 7 %
NEUTROS PCT: 60 %
NRBC: 0 % (ref 0.0–0.2)
Neutro Abs: 3.6 10*3/uL (ref 1.7–7.7)
Platelets: 142 10*3/uL — ABNORMAL LOW (ref 150–400)
RBC: 4.79 MIL/uL (ref 4.22–5.81)
RDW: 14.7 % (ref 11.5–15.5)
WBC: 5.9 10*3/uL (ref 4.0–10.5)

## 2018-05-19 LAB — COMPREHENSIVE METABOLIC PANEL
ALBUMIN: 4 g/dL (ref 3.5–5.0)
ALT: 52 U/L — ABNORMAL HIGH (ref 0–44)
AST: 32 U/L (ref 15–41)
Alkaline Phosphatase: 48 U/L (ref 38–126)
Anion gap: 7 (ref 5–15)
BILIRUBIN TOTAL: 0.5 mg/dL (ref 0.3–1.2)
BUN: 16 mg/dL (ref 6–20)
CO2: 23 mmol/L (ref 22–32)
Calcium: 9 mg/dL (ref 8.9–10.3)
Chloride: 109 mmol/L (ref 98–111)
Creatinine, Ser: 0.98 mg/dL (ref 0.61–1.24)
GFR calc Af Amer: >60 mL/min (ref >60)
GFR calc non Af Amer: >60 mL/min (ref >60)
Glucose, Bld: 175 mg/dL — ABNORMAL HIGH (ref 70–99)
Potassium: 4 mmol/L (ref 3.5–5.1)
Sodium: 139 mmol/L (ref 135–145)
Total Protein: 6.8 g/dL (ref 6.5–8.1)

## 2018-05-19 LAB — MAGNESIUM: Magnesium: 2 mg/dL (ref 1.7–2.4)

## 2018-05-19 MED ORDER — SODIUM CHLORIDE 0.9 % IV SOLN
2400.0000 mg/m2 | INTRAVENOUS | Status: DC
  Administered 2018-05-19: 5250 mg via INTRAVENOUS
  Filled 2018-05-19: qty 100

## 2018-05-19 MED ORDER — LEUCOVORIN CALCIUM INJECTION 350 MG
800.0000 mg | Freq: Once | INTRAVENOUS | Status: AC
  Administered 2018-05-19: 800 mg via INTRAVENOUS
  Filled 2018-05-19: qty 35

## 2018-05-19 MED ORDER — PALONOSETRON HCL INJECTION 0.25 MG/5ML
0.2500 mg | Freq: Once | INTRAVENOUS | Status: AC
  Administered 2018-05-19: 0.25 mg via INTRAVENOUS
  Filled 2018-05-19: qty 5

## 2018-05-19 MED ORDER — SODIUM CHLORIDE 0.9 % IV SOLN
10.0000 mg | Freq: Once | INTRAVENOUS | Status: AC
  Administered 2018-05-19: 10 mg via INTRAVENOUS
  Filled 2018-05-19: qty 10

## 2018-05-19 MED ORDER — SODIUM CHLORIDE 0.9% FLUSH
10.0000 mL | INTRAVENOUS | Status: DC | PRN
  Administered 2018-05-19: 10 mL
  Filled 2018-05-19: qty 10

## 2018-05-19 MED ORDER — OXALIPLATIN CHEMO INJECTION 100 MG/20ML
85.0000 mg/m2 | Freq: Once | INTRAVENOUS | Status: AC
  Administered 2018-05-19: 185 mg via INTRAVENOUS
  Filled 2018-05-19: qty 37

## 2018-05-19 MED ORDER — FLUOROURACIL CHEMO INJECTION 2.5 GM/50ML
400.0000 mg/m2 | Freq: Once | INTRAVENOUS | Status: AC
  Administered 2018-05-19: 900 mg via INTRAVENOUS
  Filled 2018-05-19: qty 18

## 2018-05-19 MED ORDER — PROMETHAZINE HCL 25 MG PO TABS: 25.0000 mg | ORAL_TABLET | Freq: Four times a day (QID) | ORAL | 1 refills | Status: DC | PRN

## 2018-05-19 MED ORDER — DEXTROSE 5 % IV SOLN
Freq: Once | INTRAVENOUS | Status: AC
  Administered 2018-05-19: 11:00:00 via INTRAVENOUS

## 2018-05-19 MED ORDER — SODIUM CHLORIDE 0.9 % IV SOLN
Freq: Once | INTRAVENOUS | Status: AC
  Administered 2018-05-19: 09:00:00 via INTRAVENOUS

## 2018-05-19 MED ORDER — MAGIC MOUTHWASH W/LIDOCAINE: 5.0000 mL | Freq: Four times a day (QID) | ORAL | 1 refills | Status: DC

## 2018-05-19 MED ORDER — ONDANSETRON HCL 8 MG PO TABS: 8.0000 mg | ORAL_TABLET | Freq: Three times a day (TID) | ORAL | 1 refills | Status: DC | PRN

## 2018-05-19 NOTE — Progress Notes (Signed)
To treatment area for chemotherapy.  Patient stated he feels like he is dehydrated due to being "more thirsty" even after drinking fluids.  Patient stated he is having some issues with water from the faucet due to coolness but drinks Gatorades without any problems. Reviewed water bottles at room temperature, or setting out a pitcher of water the day before to allow time to warm to room temperature with understanding verbalized.  Reviewed patients request for hydration with the nurse practitioner with verbal order NACL 543ml bolus.    Patient tolerated chemotherapy with no complaints voiced.  Port site clean and dry with no bruising or swelling noted at site.  Good blood return noted before and after administration of chemotherapy.  Dressing intact.  Chemotherapy pump connected with no alarms noted.   Patient left ambulatory with VSS and no s/s of distress noted.

## 2018-05-19 NOTE — Patient Instructions (Signed)
[facility] Discharge Instructions for Patients Receiving Chemotherapy  Today you received the following chemotherapy agents  If you develop nausea and vomiting that is not controlled by your nausea medication, call the clinic.   BELOW ARE SYMPTOMS THAT SHOULD BE REPORTED IMMEDIATELY:  *FEVER GREATER THAN 100.5 F  *CHILLS WITH OR WITHOUT FEVER  NAUSEA AND VOMITING THAT IS NOT CONTROLLED WITH YOUR NAUSEA MEDICATION  *UNUSUAL SHORTNESS OF BREATH  *UNUSUAL BRUISING OR BLEEDING  TENDERNESS IN MOUTH AND THROAT WITH OR WITHOUT PRESENCE OF ULCERS  *URINARY PROBLEMS  *BOWEL PROBLEMS  UNUSUAL RASH Items with * indicate a potential emergency and should be followed up as soon as possible.  Feel free to call the clinic should you have any questions or concerns. The clinic phone number is [phone].  Please show the CHEMO ALERT CARD at check-in to the Emergency Department and triage nurse.   

## 2018-05-20 LAB — CEA: CEA: 2.5 ng/mL (ref 0.0–4.7)

## 2018-05-21 ENCOUNTER — Encounter (HOSPITAL_COMMUNITY): Payer: Self-pay

## 2018-05-21 ENCOUNTER — Inpatient Hospital Stay (HOSPITAL_COMMUNITY): Payer: BLUE CROSS/BLUE SHIELD

## 2018-05-21 VITALS — BP 113/77 | HR 84 | Temp 98.3°F | Resp 18

## 2018-05-21 DIAGNOSIS — R590 Localized enlarged lymph nodes: Secondary | ICD-10-CM | POA: Diagnosis not present

## 2018-05-21 DIAGNOSIS — C187 Malignant neoplasm of sigmoid colon: Secondary | ICD-10-CM

## 2018-05-21 DIAGNOSIS — K760 Fatty (change of) liver, not elsewhere classified: Secondary | ICD-10-CM | POA: Diagnosis not present

## 2018-05-21 DIAGNOSIS — K521 Toxic gastroenteritis and colitis: Secondary | ICD-10-CM | POA: Diagnosis not present

## 2018-05-21 DIAGNOSIS — Z87891 Personal history of nicotine dependence: Secondary | ICD-10-CM | POA: Diagnosis not present

## 2018-05-21 DIAGNOSIS — Z5111 Encounter for antineoplastic chemotherapy: Secondary | ICD-10-CM | POA: Diagnosis not present

## 2018-05-21 DIAGNOSIS — Z803 Family history of malignant neoplasm of breast: Secondary | ICD-10-CM | POA: Diagnosis not present

## 2018-05-21 DIAGNOSIS — Z7289 Other problems related to lifestyle: Secondary | ICD-10-CM | POA: Diagnosis not present

## 2018-05-21 DIAGNOSIS — T451X5A Adverse effect of antineoplastic and immunosuppressive drugs, initial encounter: Secondary | ICD-10-CM | POA: Diagnosis not present

## 2018-05-21 DIAGNOSIS — R918 Other nonspecific abnormal finding of lung field: Secondary | ICD-10-CM | POA: Diagnosis not present

## 2018-05-21 MED ORDER — SODIUM CHLORIDE 0.9% FLUSH
10.0000 mL | INTRAVENOUS | Status: DC | PRN
  Administered 2018-05-21: 10 mL
  Filled 2018-05-21: qty 10

## 2018-05-21 MED ORDER — HEPARIN SOD (PORK) LOCK FLUSH 100 UNIT/ML IV SOLN
500.0000 [IU] | Freq: Once | INTRAVENOUS | Status: AC | PRN
  Administered 2018-05-21: 500 [IU]

## 2018-05-21 NOTE — Progress Notes (Signed)
Patient for pump disconnect.  Stated he checked his temperatures yesterday and it was 99.3 during the day but no chills.  Denied any chills or fevers today.  Also, still having nausea and stated it was worse than the last treatment.  Reviewed temperature and nausea with Dr. Delton Coombes with verbal order NACL 500 ml bolus with Zofran 8 mg.  Patient refused the hydration and nausea medication.    Port flushed per protocol and disconnected with no complaints of pain.  Good blood return noted with no bruising or swelling noted at site.  Band aid applied.  Instructed the patient to call if the nausea worsened with understanding verbalized.  Left ambulatory with no s/s of distress noted and vital signs stable.

## 2018-05-21 NOTE — Patient Instructions (Signed)
[facility] at [facility]  Discharge Instructions:   _______________________________________________________________  Thank you for choosing [facility] at [facility] to provide your oncology and hematology care.  To afford each patient quality time with our providers, please arrive at least 15 minutes before your scheduled appointment.  You need to re-schedule your appointment if you arrive 10 or more minutes late.  We strive to give you quality time with our providers, and arriving late affects you and other patients whose appointments are after yours.  Also, if you no show three or more times for appointments you may be dismissed from the clinic.  Again, thank you for choosing [facility] at [facility]. Our hope is that these requests will allow you access to exceptional care and in a timely manner. _______________________________________________________________  If you have questions after your visit, please contact our office at [phone] between the hours of 8:30 a.m. and 5:00 p.m. Voicemails left after 4:30 p.m. will not be returned until the following business day. _______________________________________________________________  For prescription refill requests, have your pharmacy contact our office. _______________________________________________________________  Recommendations made by the consultant and any test results will be sent to your referring physician. _______________________________________________________________ 

## 2018-05-22 DIAGNOSIS — C187 Malignant neoplasm of sigmoid colon: Secondary | ICD-10-CM | POA: Diagnosis not present

## 2018-05-30 ENCOUNTER — Other Ambulatory Visit (HOSPITAL_COMMUNITY): Payer: Self-pay | Admitting: General Surgery

## 2018-06-02 ENCOUNTER — Inpatient Hospital Stay (HOSPITAL_COMMUNITY): Payer: BLUE CROSS/BLUE SHIELD

## 2018-06-02 ENCOUNTER — Inpatient Hospital Stay (HOSPITAL_BASED_OUTPATIENT_CLINIC_OR_DEPARTMENT_OTHER): Payer: BLUE CROSS/BLUE SHIELD | Admitting: Hematology

## 2018-06-02 ENCOUNTER — Other Ambulatory Visit: Payer: Self-pay

## 2018-06-02 ENCOUNTER — Encounter (HOSPITAL_COMMUNITY): Payer: Self-pay | Admitting: Hematology

## 2018-06-02 VITALS — BP 130/86 | HR 88 | Temp 98.2°F | Resp 16

## 2018-06-02 VITALS — BP 143/89 | HR 83 | Temp 98.6°F | Resp 20 | Wt 228.0 lb

## 2018-06-02 DIAGNOSIS — R590 Localized enlarged lymph nodes: Secondary | ICD-10-CM

## 2018-06-02 DIAGNOSIS — C187 Malignant neoplasm of sigmoid colon: Secondary | ICD-10-CM | POA: Diagnosis not present

## 2018-06-02 DIAGNOSIS — K760 Fatty (change of) liver, not elsewhere classified: Secondary | ICD-10-CM

## 2018-06-02 DIAGNOSIS — Z803 Family history of malignant neoplasm of breast: Secondary | ICD-10-CM

## 2018-06-02 DIAGNOSIS — Z87891 Personal history of nicotine dependence: Secondary | ICD-10-CM | POA: Diagnosis not present

## 2018-06-02 DIAGNOSIS — Z7289 Other problems related to lifestyle: Secondary | ICD-10-CM

## 2018-06-02 DIAGNOSIS — R918 Other nonspecific abnormal finding of lung field: Secondary | ICD-10-CM | POA: Diagnosis not present

## 2018-06-02 DIAGNOSIS — K521 Toxic gastroenteritis and colitis: Secondary | ICD-10-CM | POA: Diagnosis not present

## 2018-06-02 DIAGNOSIS — T451X5A Adverse effect of antineoplastic and immunosuppressive drugs, initial encounter: Secondary | ICD-10-CM | POA: Diagnosis not present

## 2018-06-02 DIAGNOSIS — Z5111 Encounter for antineoplastic chemotherapy: Secondary | ICD-10-CM | POA: Diagnosis not present

## 2018-06-02 LAB — CBC WITH DIFFERENTIAL/PLATELET
Abs Immature Granulocytes: 0.02 10*3/uL (ref 0.00–0.07)
Basophils Absolute: 0 10*3/uL (ref 0.0–0.1)
Basophils Relative: 1 %
EOS ABS: 0.2 10*3/uL (ref 0.0–0.5)
Eosinophils Relative: 3 %
HEMATOCRIT: 41.1 % (ref 39.0–52.0)
Hemoglobin: 13.9 g/dL (ref 13.0–17.0)
Immature Granulocytes: 0 %
Lymphocytes Relative: 29 %
Lymphs Abs: 1.7 10*3/uL (ref 0.7–4.0)
MCH: 29.5 pg (ref 26.0–34.0)
MCHC: 33.8 g/dL (ref 30.0–36.0)
MCV: 87.3 fL (ref 80.0–100.0)
Monocytes Absolute: 0.4 10*3/uL (ref 0.1–1.0)
Monocytes Relative: 6 %
NEUTROS PCT: 61 %
Neutro Abs: 3.6 10*3/uL (ref 1.7–7.7)
Platelets: 112 10*3/uL — ABNORMAL LOW (ref 150–400)
RBC: 4.71 MIL/uL (ref 4.22–5.81)
RDW: 15.1 % (ref 11.5–15.5)
WBC: 5.9 10*3/uL (ref 4.0–10.5)
nRBC: 0 % (ref 0.0–0.2)

## 2018-06-02 LAB — COMPREHENSIVE METABOLIC PANEL
ALBUMIN: 3.9 g/dL (ref 3.5–5.0)
ALT: 46 U/L — ABNORMAL HIGH (ref 0–44)
AST: 28 U/L (ref 15–41)
Alkaline Phosphatase: 53 U/L (ref 38–126)
Anion gap: 7 (ref 5–15)
BILIRUBIN TOTAL: 0.4 mg/dL (ref 0.3–1.2)
BUN: 13 mg/dL (ref 6–20)
CO2: 22 mmol/L (ref 22–32)
Calcium: 8.6 mg/dL — ABNORMAL LOW (ref 8.9–10.3)
Chloride: 108 mmol/L (ref 98–111)
Creatinine, Ser: 0.89 mg/dL (ref 0.61–1.24)
GFR calc Af Amer: >60 mL/min (ref >60)
GLUCOSE: 291 mg/dL — AB (ref 70–99)
Potassium: 4.4 mmol/L (ref 3.5–5.1)
Sodium: 137 mmol/L (ref 135–145)
Total Protein: 6.4 g/dL — ABNORMAL LOW (ref 6.5–8.1)

## 2018-06-02 MED ORDER — OXALIPLATIN CHEMO INJECTION 100 MG/20ML
85.0000 mg/m2 | Freq: Once | INTRAVENOUS | Status: AC
  Administered 2018-06-02: 185 mg via INTRAVENOUS
  Filled 2018-06-02: qty 37

## 2018-06-02 MED ORDER — FLUOROURACIL CHEMO INJECTION 2.5 GM/50ML
400.0000 mg/m2 | Freq: Once | INTRAVENOUS | Status: AC
  Administered 2018-06-02: 900 mg via INTRAVENOUS
  Filled 2018-06-02: qty 18

## 2018-06-02 MED ORDER — SODIUM CHLORIDE 0.9 % IV SOLN
2400.0000 mg/m2 | INTRAVENOUS | Status: DC
  Administered 2018-06-02: 5250 mg via INTRAVENOUS
  Filled 2018-06-02: qty 105

## 2018-06-02 MED ORDER — SODIUM CHLORIDE 0.9 % IV SOLN
Freq: Once | INTRAVENOUS | Status: AC
  Administered 2018-06-02: 11:00:00 via INTRAVENOUS
  Filled 2018-06-02: qty 5

## 2018-06-02 MED ORDER — LORAZEPAM 2 MG/ML IJ SOLN
INTRAMUSCULAR | Status: AC
  Filled 2018-06-02: qty 1

## 2018-06-02 MED ORDER — LORAZEPAM 2 MG/ML IJ SOLN
1.0000 mg | Freq: Once | INTRAMUSCULAR | Status: AC
  Administered 2018-06-02: 1 mg via INTRAVENOUS

## 2018-06-02 MED ORDER — DEXTROSE 5 % IV SOLN
Freq: Once | INTRAVENOUS | Status: AC
  Administered 2018-06-02: 11:00:00 via INTRAVENOUS

## 2018-06-02 MED ORDER — SODIUM CHLORIDE 0.9% FLUSH
10.0000 mL | INTRAVENOUS | Status: DC | PRN
  Administered 2018-06-02: 10 mL
  Filled 2018-06-02: qty 10

## 2018-06-02 MED ORDER — LEUCOVORIN CALCIUM INJECTION 350 MG
800.0000 mg | Freq: Once | INTRAVENOUS | Status: AC
  Administered 2018-06-02: 800 mg via INTRAVENOUS
  Filled 2018-06-02: qty 35

## 2018-06-02 MED ORDER — PALONOSETRON HCL INJECTION 0.25 MG/5ML
0.2500 mg | Freq: Once | INTRAVENOUS | Status: AC
  Administered 2018-06-02: 0.25 mg via INTRAVENOUS
  Filled 2018-06-02: qty 5

## 2018-06-02 NOTE — Assessment & Plan Note (Signed)
1.  Stage III (pT3 pN2b) sigmoid colon adenocarcinoma, MSI-stable: - Patient had left lower quadrant abdominal pain since October, CT scan done at [facility] of the abdomen pelvis showed sigmoid colon thickening. -he underwent colonoscopy on 03/06/2018 showing a fungating infiltrative and polypoid partially obstructing large mass in the sigmoid colon between 20-25 cm from the anal verge.  This was consistent with colon cancer. - He underwent sigmoid colon segmental resection on 03/02/2018 by Dr. Arnoldo Morale.  This showed invasive adenocarcinoma, well-differentiated, 3.8 cm, tumor invades through muscularis propria, resection margins negative, 14/35 lymph nodes positive, satellite nodules x3, PT 3, PN2B, MMR preserved. - CT of the chest on 02/26/2018 shows 2 small pulmonary nodules, 7 x 5 mm subpleural nodule in the right major fissure and the 4 mm left lower lobe nodule.  Mild hepatic steatosis was seen. - PET/CT scan on 03/30/2018 shows bilateral hypermetabolic pelvic sidewall lymph nodes, subcentimeter, not in the initial distribution pattern for sigmoid primary.  Lymph nodes are in the bilateral external iliac and right inguinal lymph node (7 mm).  They are most likely reactive.  A pelvic CT in 3 months is recommended.  Given normal CEA level, they are more likely reactive. -Adjuvant FOLFOX cycle 1 started on 04/21/2018. - Cycle 3 was on 05/19/2018.  He complained of nausea which lasted about 5 days.  Denied any vomiting.  Compazine did not seem to help.  We have given a prescription for Phenergan 25 mg every 6 hours as needed.  He takes along with Zofran 8 mg every 8 hours as needed. - I will add Emend to the antiemetic regimen. -He also had cold sensitivity lasting about 4 to 5 days but denies any tingling or numbness in the extremities.  We will keep a close eye on it. - He may proceed with cycle 4 today.  I will see him back in 4 weeks for follow-up.  2.  Family history: -2 maternal aunts had breast  cancer.  No family history of colorectal cancer. - I have also recommended genetic testing because of his young age at diagnosis.

## 2018-06-02 NOTE — Patient Instructions (Signed)
[facility] Discharge Instructions for Patients Receiving Chemotherapy  Today you received the following chemotherapy agents   To help prevent nausea and vomiting after your treatment, we encourage you to take your nausea medication   If you develop nausea and vomiting that is not controlled by your nausea medication, call the clinic.   BELOW ARE SYMPTOMS THAT SHOULD BE REPORTED IMMEDIATELY:  *FEVER GREATER THAN 100.5 F  *CHILLS WITH OR WITHOUT FEVER  NAUSEA AND VOMITING THAT IS NOT CONTROLLED WITH YOUR NAUSEA MEDICATION  *UNUSUAL SHORTNESS OF BREATH  *UNUSUAL BRUISING OR BLEEDING  TENDERNESS IN MOUTH AND THROAT WITH OR WITHOUT PRESENCE OF ULCERS  *URINARY PROBLEMS  *BOWEL PROBLEMS  UNUSUAL RASH Items with * indicate a potential emergency and should be followed up as soon as possible.  Feel free to call the clinic should you have any questions or concerns. The clinic phone number is [phone].  Please show the CHEMO ALERT CARD at check-in to the Emergency Department and triage nurse.   

## 2018-06-02 NOTE — Progress Notes (Signed)
Joel Jefferson, Joel Jefferson [id number]   CLINIC:  Medical Oncology/Hematology  PCP:  Sharilyn Sites, [place] [address] [phone]   REASON FOR VISIT: Follow-up for stage III colon cancer  CURRENT THERAPY:Folfox every 2 weeks  BRIEF ONCOLOGIC HISTORY:    Malignant neoplasm of sigmoid colon [facility])    Initial Diagnosis    Cancer of sigmoid colon ([place])    03/27/2018 Genetic Testing    ALK c.350C>G VUS identified on the multicancer panel.  The Multi-Gene Panel offered by [facility] includes sequencing and/or deletion duplication testing of the following 85 genes: AIP, ALK, APC, ATM, AXIN2,BAP1,  BARD1, BLM, BMPR1A, BRCA1, BRCA2, BRIP1, CASR, CDC73, CDH1, CDK4, CDKN1B, CDKN1C, CDKN2A (p14ARF), CDKN2A (p16INK4a), CEBPA, CHEK2, CTNNA1, DICER1, DIS3L2, EGFR (c.2369C>T, p.Thr790Met variant only), EPCAM (Deletion/duplication testing only), FH, FLCN, GATA2, GPC3, GREM1 (Promoter region deletion/duplication testing only), HOXB13 (c.251G>A, p.Gly84Glu), HRAS, KIT, MAX, MEN1, MET, MITF (c.952G>A, p.Glu318Lys variant only), MLH1, MSH2, MSH3, MSH6, MUTYH, NBN, NF1, NF2, NTHL1, PALB2, PDGFRA, PHOX2B, PMS2, POLD1, POLE, POT1, PRKAR1A, PTCH1, PTEN, RAD50, RAD51C, RAD51D, RB1, RECQL4, RET, RNF43, RUNX1, SDHAF2, SDHA (sequence changes only), SDHB, SDHC, SDHD, SMAD4, SMARCA4, SMARCB1, SMARCE1, STK11, SUFU, TERC, TERT, TMEM127, TP53, TSC1, TSC2, VHL, WRN and WT1.   The report date is 03/27/2018.    04/21/2018 -  Chemotherapy    The patient had palonosetron (ALOXI) injection 0.25 mg, 0.25 mg, Intravenous,  Once, 4 of 12 cycles Administration: 0.25 mg (04/21/2018), 0.25 mg (05/05/2018), 0.25 mg (05/19/2018) leucovorin 800 mg in dextrose 5 % 250 mL infusion, 876 mg, Intravenous,  Once, 4 of 12 cycles Administration: 800 mg (04/21/2018), 900 mg (05/05/2018), 800 mg (05/19/2018) oxaliplatin (ELOXATIN) 185 mg in dextrose 5 % 500 mL chemo infusion, 85 mg/m2 = 185 mg,  Intravenous,  Once, 4 of 12 cycles Administration: 185 mg (04/21/2018), 185 mg (05/05/2018), 185 mg (05/19/2018) fluorouracil (ADRUCIL) chemo injection 900 mg, 400 mg/m2 = 900 mg, Intravenous,  Once, 4 of 12 cycles Administration: 900 mg (04/21/2018), 900 mg (05/05/2018), 900 mg (05/19/2018) fosaprepitant (EMEND) 150 mg, dexamethasone (DECADRON) 12 mg in sodium chloride 0.9 % 145 mL IVPB, , Intravenous,  Once, 1 of 9 cycles fluorouracil (ADRUCIL) 5,250 mg in sodium chloride 0.9 % 145 mL chemo infusion, 2,400 mg/m2 = 5,250 mg, Intravenous, 1 Day/Dose, 4 of 12 cycles Administration: 5,250 mg (04/21/2018), 5,250 mg (05/05/2018), 5,250 mg (05/19/2018)  for chemotherapy treatment.       INTERVAL HISTORY:  Joel Jefferson 43 y.o. male returns for routine follow-up for colon cancer. He is here today with his wife. He reports he had bad nausea for 4 days after his treatment. He also reports having the cold sensitivity for 4 days after treatment. He is having a metallic taste in his mouth. He is still eating well and maintaining his weight. Denies any vomiting or diarrhea. Denies any new pains. Had not noticed any recent bleeding such as epistaxis, hematuria or hematochezia. Denies recent chest pain on exertion, shortness of breath on minimal exertion, pre-syncopal episodes, or palpitations. Denies any numbness or tingling in hands or feet. Denies any recent fevers, infections, or recent hospitalizations. Patient reports appetite at 100% and energy level at 100%.    REVIEW OF SYSTEMS:  Review of Systems  Constitutional: Positive for fatigue.  Gastrointestinal: Positive for nausea.  Neurological: Positive for numbness (cold sensitivity ).  All other systems reviewed and are negative.    PAST MEDICAL/SURGICAL HISTORY:  Past Medical History:  Diagnosis  Date  . Family history of breast cancer   . GERD (gastroesophageal reflux disease)   . History of kidney stones   . Hypercholesterolemia   . Hypertension   .  Urethral stricture    Past Surgical History:  Procedure Laterality Date  . BALLOON DILATION  04/06/2012   Procedure: BALLOON DILATION;  Surgeon: Reece Packer, MD;  Location: [facility];  Service: Urology;  Laterality: N/A;  . BIOPSY  02/24/2018   Procedure: BIOPSY;  Surgeon: Danie Binder, MD;  [facility];  Service: Endoscopy;;  colon  . COLONOSCOPY WITH PROPOFOL N/A 02/24/2018   Procedure: COLONOSCOPY WITH PROPOFOL;  Surgeon: Danie Binder, MD;  Location[facility];  Service: Endoscopy;  Laterality: N/A;  9:30am  . ESOPHAGOGASTRODUODENOSCOPY (EGD) WITH PROPOFOL N/A 08/29/2015   Dr. Oneida Alar: possible proximal esophageal web s/p dilation, moderate gastritis, negative eosinophilic esophagitis   . PARTIAL COLECTOMY N/A 03/02/2018   Procedure: PARTIAL COLECTOMY;  Surgeon: Aviva Signs, MD;  Location: [facility];  Service: General;  Laterality: N/A;  . PORTACATH PLACEMENT Left 04/13/2018   Procedure: INSERTION PORT-A-CATH;  Surgeon: Aviva Signs, MD;  Location: [facility];  Service: General;  Laterality: Left;  . SAVORY DILATION N/A 08/29/2015   Procedure: SAVORY DILATION;  Surgeon: Danie Binder, MD;  [facility];  Service: Endoscopy;  Laterality: N/A;  . URETHRAL DILATION    . WISDOM TOOTH EXTRACTION       SOCIAL HISTORY:  Social History   Socioeconomic History  . Marital status: Divorced    Spouse name: Not on file  . Number of children: Not on file  . Years of education: Not on file  . Highest education level: Not on file  Occupational History  . Not on file  Social Needs  . Financial resource strain: Not on file  . Food insecurity:    Worry: Not on file    Inability: Not on file  . Transportation needs:    Medical: Not on file    Non-medical: Not on file  Tobacco Use  . Smoking status: Former Smoker    Packs/day: 0.25    Years: 9.00    Pack years: 2.25    Types: Cigarettes    Last attempt to quit: 07/27/2011    Years since  quitting: 6.8  . Smokeless tobacco: Never Used  Substance and Sexual Activity  . Alcohol use: Yes    Alcohol/week: 6.0 standard drinks    Types: 6 Cans of beer per week  . Drug use: No  . Sexual activity: Yes    Birth control/protection: None  Lifestyle  . Physical activity:    Days per week: Not on file    Minutes per session: Not on file  . Stress: Not on file  Relationships  . Social connections:    Talks on phone: Not on file    Gets together: Not on file    Attends religious service: Not on file    Active member of club or organization: Not on file    Attends meetings of clubs or organizations: Not on file    Relationship status: Not on file  . Intimate partner violence:    Fear of current or ex partner: Not on file    Emotionally abused: Not on file    Physically abused: Not on file    Forced sexual activity: Not on file  Other Topics Concern  . Not on file  Social History Narrative  .  Not on file    FAMILY HISTORY:  Family History  Problem Relation Age of Onset  . Dementia Mother   . Dementia Maternal Aunt   . Dementia Maternal Grandmother   . COPD Maternal Grandfather   . Breast cancer Maternal Aunt 42  . Breast cancer Maternal Aunt        dx in her 38s  . Lung cancer Maternal Aunt   . Healthy Son   . Colon cancer Neg Hx     CURRENT MEDICATIONS:  Outpatient Encounter Medications as of 06/02/2018  Medication Sig  . Ascorbic Acid (VITAMIN C) 1000 MG tablet Take 1,000 mg by mouth daily.  Marland Kitchen esomeprazole (NEXIUM) 40 MG capsule Take 40 mg by mouth daily.   . fluorouracil CALGB 10175 in sodium chloride 0.9 % 150 mL Inject 6,150 mg into the vein every 14 (fourteen) days. Over 46 hours  . LEUCOVORIN CALCIUM IV Inject 876 mg into the vein every 14 (fourteen) days.  Marland Kitchen lidocaine (XYLOCAINE) 2 % solution Use as directed 15 mLs in the mouth or throat 2 (two) times daily.  Marland Kitchen lidocaine-prilocaine (EMLA) cream Apply small amount to port site one hour prior to  appointment. Cover with plastic wrap  . lisinopril (PRINIVIL,ZESTRIL) 10 MG tablet Take 10 mg by mouth daily.   . Magnesium 500 MG CAPS Take 500 mg by mouth daily.   . Multiple Vitamin (MULTIVITAMIN) tablet Take 1 tablet by mouth daily.  . ondansetron (ZOFRAN) 8 MG tablet Take 1 tablet (8 mg total) by mouth every 8 (eight) hours as needed for nausea or vomiting (may start taking as needed two days after chemotherapy).  . OXALIPLATIN IV Inject 185 mg into the vein every 14 (fourteen) days.  . Potassium (POTASSIMIN PO) Take 1 tablet by mouth daily.   . pravastatin (PRAVACHOL) 20 MG tablet Take 20 mg by mouth daily.   . promethazine (PHENERGAN) 25 MG tablet Take 1 tablet (25 mg total) by mouth every 6 (six) hours as needed for nausea or vomiting.  . topiramate (TOPAMAX) 100 MG tablet Take 100 mg by mouth 2 (two) times daily.   . traMADol (ULTRAM) 50 MG tablet Take 1 tablet (50 mg total) by mouth every 6 (six) hours as needed. (Patient taking differently: Take 50 mg by mouth every 6 (six) hours as needed for moderate pain. )  . [DISCONTINUED] magic mouthwash w/lidocaine SOLN Take 5 mLs by mouth 4 (four) times daily.   No facility-administered encounter medications on file as of 06/02/2018.     ALLERGIES:  No Known Allergies   PHYSICAL EXAM:  ECOG Performance status: 1  Vitals:   06/02/18 0937  BP: (!) 143/89  Pulse: 83  Resp: 20  Temp: 98.6 F (37 C)  SpO2: 99%   Filed Weights   06/02/18 0937  Weight: 228 lb (103.4 kg)    Physical Exam Constitutional:      Appearance: Normal appearance. He is normal weight.  Cardiovascular:     Rate and Rhythm: Normal rate and regular rhythm.     Heart sounds: Normal heart sounds.  Pulmonary:     Effort: Pulmonary effort is normal.     Breath sounds: Normal breath sounds.  Musculoskeletal: Normal range of motion.  Skin:    General: Skin is warm and dry.  Neurological:     Mental Status: He is alert and oriented to person, place, and time.  Mental status is at baseline.  Psychiatric:        Mood and  Affect: Mood normal.        Behavior: Behavior normal.        Thought Content: Thought content normal.        Judgment: Judgment normal.      LABORATORY DATA:  I have reviewed the labs as listed.  CBC    Component Value Date/Time   WBC 5.9 06/02/2018 0910   RBC 4.71 06/02/2018 0910   HGB 13.9 06/02/2018 0910   HCT 41.1 06/02/2018 0910   PLT 112 (L) 06/02/2018 0910   MCV 87.3 06/02/2018 0910   MCH 29.5 06/02/2018 0910   MCHC 33.8 06/02/2018 0910   RDW 15.1 06/02/2018 0910   LYMPHSABS 1.7 06/02/2018 0910   MONOABS 0.4 06/02/2018 0910   EOSABS 0.2 06/02/2018 0910   BASOSABS 0.0 06/02/2018 0910   CMP Latest Ref Rng & Units 06/02/2018 05/19/2018 05/05/2018  Glucose 70 - 99 mg/dL 291(H) 175(H) 171(H)  BUN 6 - 20 mg/dL '13 16 14  ' Creatinine 0.61 - 1.24 mg/dL 0.89 0.98 0.91  Sodium 135 - 145 mmol/L 137 139 139  Potassium 3.5 - 5.1 mmol/L 4.4 4.0 3.9  Chloride 98 - 111 mmol/L 108 109 112(H)  CO2 22 - 32 mmol/L '22 23 22  ' Calcium 8.9 - 10.3 mg/dL 8.6(L) 9.0 8.9  Total Protein 6.5 - 8.1 g/dL 6.4(L) 6.8 6.8  Total Bilirubin 0.3 - 1.2 mg/dL 0.4 0.5 0.4  Alkaline Phos 38 - 126 U/L 53 48 50  AST 15 - 41 U/L 28 32 31  ALT 0 - 44 U/L 46(H) 52(H) 53(H)       DIAGNOSTIC IMAGING:  I have independently reviewed the scans and discussed with the patient.   I have reviewed Francene Finders, NP's note and agree with the documentation.  I personally performed a face-to-face visit, made revisions and my assessment and plan is as follows.    ASSESSMENT & PLAN:   Malignant neoplasm of sigmoid colon (H[facility]) 1.  Stage III (pT3 pN2b) sigmoid colon adenocarcinoma, MSI-stable: - Patient had left lower quadrant abdominal pain since October, CT scan done at [facility] of the abdomen pelvis showed sigmoid colon thickening. -he underwent colonoscopy on 03/06/2018 showing a fungating infiltrative and polypoid partially obstructing large mass in the  sigmoid colon between 20-25 cm from the anal verge.  This was consistent with colon cancer. - He underwent sigmoid colon segmental resection on 03/02/2018 by Dr. Arnoldo Morale.  This showed invasive adenocarcinoma, well-differentiated, 3.8 cm, tumor invades through muscularis propria, resection margins negative, 14/35 lymph nodes positive, satellite nodules x3, PT 3, PN2B, MMR preserved. - CT of the chest on 02/26/2018 shows 2 small pulmonary nodules, 7 x 5 mm subpleural nodule in the right major fissure and the 4 mm left lower lobe nodule.  Mild hepatic steatosis was seen. - PET/CT scan on 03/30/2018 shows bilateral hypermetabolic pelvic sidewall lymph nodes, subcentimeter, not in the initial distribution pattern for sigmoid primary.  Lymph nodes are in the bilateral external iliac and right inguinal lymph node (7 mm).  They are most likely reactive.  A pelvic CT in 3 months is recommended.  Given normal CEA level, they are more likely reactive. -Adjuvant FOLFOX cycle 1 started on 04/21/2018. - Cycle 3 was on 05/19/2018.  He complained of nausea which lasted about 5 days.  Denied any vomiting.  Compazine did not seem to help.  We have given a prescription for Phenergan 25 mg every 6 hours as needed.  He takes along with Zofran 8 mg every 8 hours  as needed. - I will add Emend to the antiemetic regimen. -He also had cold sensitivity lasting about 4 to 5 days but denies any tingling or numbness in the extremities.  We will keep a close eye on it. - He may proceed with cycle 4 today.  I will see him back in 4 weeks for follow-up.  2.  Family history: -2 maternal aunts had breast cancer.  No family history of colorectal cancer. - I have also recommended genetic testing because of his young age at diagnosis.      Orders placed this encounter:  Orders Placed This Encounter  Procedures  . CEA      Derek Jack, MD [place] [phone]

## 2018-06-02 NOTE — Progress Notes (Signed)
Labs reviewed at office visit today, Dr. Delton Coombes is adding amend to the plan. Proceed with treatment per md.   1245-pt still having dry heaves and nausea. MD notified, will try ativan per orders.

## 2018-06-02 NOTE — Patient Instructions (Signed)
[facility] at [facility] Discharge Instructions  Today you saw Dr. Katragadda   Thank you for choosing [facility] at [facility] to provide your oncology and hematology care.  To afford each patient quality time with our provider, please arrive at least 15 minutes before your scheduled appointment time.   If you have a lab appointment with the Cancer Center please come in thru the  Main Entrance and check in at the main information desk  You need to re-schedule your appointment should you arrive 10 or more minutes late.  We strive to give you quality time with our providers, and arriving late affects you and other patients whose appointments are after yours.  Also, if you no show three or more times for appointments you may be dismissed from the clinic at the providers discretion.     Again, thank you for choosing [facility].  Our hope is that these requests will decrease the amount of time that you wait before being seen by our physicians.       _____________________________________________________________  Should you have questions after your visit to  Cancer Center, please contact our office at [phone] between the hours of 8:00 a.m. and 4:30 p.m.  Voicemails left after 4:00 p.m. will not be returned until the following business day.  For prescription refill requests, have your pharmacy contact our office and allow 72 hours.    Cancer Center Support Programs:   > Cancer Support Group  2nd Tuesday of the month 1pm-2pm, Journey Room   

## 2018-06-04 ENCOUNTER — Inpatient Hospital Stay (HOSPITAL_COMMUNITY): Payer: BLUE CROSS/BLUE SHIELD

## 2018-06-04 ENCOUNTER — Encounter (HOSPITAL_COMMUNITY): Payer: Self-pay

## 2018-06-04 DIAGNOSIS — T451X5A Adverse effect of antineoplastic and immunosuppressive drugs, initial encounter: Secondary | ICD-10-CM | POA: Diagnosis not present

## 2018-06-04 DIAGNOSIS — Z803 Family history of malignant neoplasm of breast: Secondary | ICD-10-CM | POA: Diagnosis not present

## 2018-06-04 DIAGNOSIS — K760 Fatty (change of) liver, not elsewhere classified: Secondary | ICD-10-CM | POA: Diagnosis not present

## 2018-06-04 DIAGNOSIS — C187 Malignant neoplasm of sigmoid colon: Secondary | ICD-10-CM | POA: Diagnosis not present

## 2018-06-04 DIAGNOSIS — Z7289 Other problems related to lifestyle: Secondary | ICD-10-CM | POA: Diagnosis not present

## 2018-06-04 DIAGNOSIS — K521 Toxic gastroenteritis and colitis: Secondary | ICD-10-CM | POA: Diagnosis not present

## 2018-06-04 DIAGNOSIS — R590 Localized enlarged lymph nodes: Secondary | ICD-10-CM | POA: Diagnosis not present

## 2018-06-04 DIAGNOSIS — Z87891 Personal history of nicotine dependence: Secondary | ICD-10-CM | POA: Diagnosis not present

## 2018-06-04 DIAGNOSIS — R918 Other nonspecific abnormal finding of lung field: Secondary | ICD-10-CM | POA: Diagnosis not present

## 2018-06-04 DIAGNOSIS — Z5111 Encounter for antineoplastic chemotherapy: Secondary | ICD-10-CM | POA: Diagnosis not present

## 2018-06-04 MED ORDER — SODIUM CHLORIDE 0.9% FLUSH
10.0000 mL | Freq: Once | INTRAVENOUS | Status: AC
  Administered 2018-06-04: 10 mL

## 2018-06-04 MED ORDER — HEPARIN SOD (PORK) LOCK FLUSH 100 UNIT/ML IV SOLN
500.0000 [IU] | Freq: Once | INTRAVENOUS | Status: AC
  Administered 2018-06-04: 500 [IU] via INTRAVENOUS

## 2018-06-04 NOTE — Progress Notes (Signed)
Joel Jefferson. presented for Portacath flush. 5FU pump d/c'd at this time.  Portacath located in the left chest  Clean, Dry and Intact Good blood return present. Portacath flushed with 8ml NS and 500U/12ml Heparin per protocol and needle removed intact. Procedure without incident. Patient tolerated procedure well.

## 2018-06-15 ENCOUNTER — Other Ambulatory Visit (HOSPITAL_COMMUNITY): Payer: Self-pay | Admitting: Hematology

## 2018-06-16 ENCOUNTER — Other Ambulatory Visit: Payer: Self-pay

## 2018-06-16 ENCOUNTER — Inpatient Hospital Stay (HOSPITAL_COMMUNITY): Payer: BLUE CROSS/BLUE SHIELD | Attending: Hematology

## 2018-06-16 ENCOUNTER — Encounter (HOSPITAL_COMMUNITY): Payer: Self-pay

## 2018-06-16 ENCOUNTER — Emergency Department (HOSPITAL_COMMUNITY): Payer: BLUE CROSS/BLUE SHIELD

## 2018-06-16 ENCOUNTER — Other Ambulatory Visit (HOSPITAL_COMMUNITY): Payer: Self-pay

## 2018-06-16 ENCOUNTER — Emergency Department (HOSPITAL_COMMUNITY)
Admission: EM | Admit: 2018-06-16 | Discharge: 2018-06-16 | Disposition: A | Discharge status: Home / Self Care | Payer: BLUE CROSS/BLUE SHIELD | Attending: Emergency Medicine | Admitting: Emergency Medicine

## 2018-06-16 ENCOUNTER — Inpatient Hospital Stay (HOSPITAL_COMMUNITY): Payer: BLUE CROSS/BLUE SHIELD

## 2018-06-16 ENCOUNTER — Encounter (HOSPITAL_COMMUNITY): Payer: Self-pay | Admitting: Emergency Medicine

## 2018-06-16 VITALS — BP 132/89 | HR 114 | Temp 100.0°F | Resp 18 | Wt 224.8 lb

## 2018-06-16 DIAGNOSIS — I10 Essential (primary) hypertension: Secondary | ICD-10-CM | POA: Insufficient documentation

## 2018-06-16 DIAGNOSIS — Z85038 Personal history of other malignant neoplasm of large intestine: Secondary | ICD-10-CM | POA: Diagnosis not present

## 2018-06-16 DIAGNOSIS — C187 Malignant neoplasm of sigmoid colon: Secondary | ICD-10-CM

## 2018-06-16 DIAGNOSIS — Z79899 Other long term (current) drug therapy: Secondary | ICD-10-CM | POA: Insufficient documentation

## 2018-06-16 DIAGNOSIS — R509 Fever, unspecified: Secondary | ICD-10-CM | POA: Insufficient documentation

## 2018-06-16 DIAGNOSIS — R000 Tachycardia, unspecified: Secondary | ICD-10-CM | POA: Diagnosis not present

## 2018-06-16 DIAGNOSIS — Z5111 Encounter for antineoplastic chemotherapy: Secondary | ICD-10-CM | POA: Insufficient documentation

## 2018-06-16 DIAGNOSIS — Z452 Encounter for adjustment and management of vascular access device: Secondary | ICD-10-CM | POA: Insufficient documentation

## 2018-06-16 LAB — CBC WITH DIFFERENTIAL/PLATELET
Abs Immature Granulocytes: 0.02 10*3/uL (ref 0.00–0.07)
Basophils Absolute: 0 10*3/uL (ref 0.0–0.1)
Basophils Relative: 0 %
Eosinophils Absolute: 0 10*3/uL (ref 0.0–0.5)
Eosinophils Relative: 0 %
HCT: 40.9 % (ref 39.0–52.0)
Hemoglobin: 14 g/dL (ref 13.0–17.0)
Immature Granulocytes: 0 %
Lymphocytes Relative: 10 %
Lymphs Abs: 0.5 10*3/uL — ABNORMAL LOW (ref 0.7–4.0)
MCH: 29.7 pg (ref 26.0–34.0)
MCHC: 34.2 g/dL (ref 30.0–36.0)
MCV: 86.7 fL (ref 80.0–100.0)
MONO ABS: 0.3 10*3/uL (ref 0.1–1.0)
Monocytes Relative: 6 %
NEUTROS PCT: 84 %
Neutro Abs: 4 10*3/uL (ref 1.7–7.7)
Platelets: 90 10*3/uL — ABNORMAL LOW (ref 150–400)
RBC: 4.72 MIL/uL (ref 4.22–5.81)
RDW: 15.4 % (ref 11.5–15.5)
WBC: 4.8 10*3/uL (ref 4.0–10.5)
nRBC: 0 % (ref 0.0–0.2)

## 2018-06-16 LAB — URINALYSIS, ROUTINE W REFLEX MICROSCOPIC
Bilirubin Urine: NEGATIVE
GLUCOSE, UA: 50 mg/dL — AB
Hgb urine dipstick: NEGATIVE
Ketones, ur: NEGATIVE mg/dL
Leukocytes,Ua: NEGATIVE
Nitrite: NEGATIVE
Protein, ur: NEGATIVE mg/dL
Specific Gravity, Urine: 1.019 (ref 1.005–1.030)
pH: 7 (ref 5.0–8.0)

## 2018-06-16 LAB — COMPREHENSIVE METABOLIC PANEL
ALT: 43 U/L (ref 0–44)
AST: 28 U/L (ref 15–41)
Albumin: 3.9 g/dL (ref 3.5–5.0)
Alkaline Phosphatase: 52 U/L (ref 38–126)
Anion gap: 7 (ref 5–15)
BUN: 19 mg/dL (ref 6–20)
CO2: 20 mmol/L — ABNORMAL LOW (ref 22–32)
Calcium: 8.7 mg/dL — ABNORMAL LOW (ref 8.9–10.3)
Chloride: 109 mmol/L (ref 98–111)
Creatinine, Ser: 1.19 mg/dL (ref 0.61–1.24)
GFR calc Af Amer: >60 mL/min (ref >60)
GFR calc non Af Amer: >60 mL/min (ref >60)
Glucose, Bld: 226 mg/dL — ABNORMAL HIGH (ref 70–99)
Potassium: 3.9 mmol/L (ref 3.5–5.1)
Sodium: 136 mmol/L (ref 135–145)
Total Bilirubin: 0.5 mg/dL (ref 0.3–1.2)
Total Protein: 6.6 g/dL (ref 6.5–8.1)

## 2018-06-16 LAB — INFLUENZA PANEL BY PCR (TYPE A & B)
Influenza A By PCR: NEGATIVE
Influenza B By PCR: NEGATIVE

## 2018-06-16 MED ORDER — HEPARIN SOD (PORK) LOCK FLUSH 100 UNIT/ML IV SOLN
500.0000 [IU] | Freq: Once | INTRAVENOUS | Status: DC
  Administered 2018-06-16: 500 [IU] via INTRAVENOUS

## 2018-06-16 MED ORDER — OXALIPLATIN CHEMO INJECTION 100 MG/20ML: 85.0000 mg/m2 | Freq: Once | INTRAVENOUS | Status: DC

## 2018-06-16 MED ORDER — DEXTROSE 5 % IV SOLN: Freq: Once | INTRAVENOUS | Status: DC

## 2018-06-16 MED ORDER — HEPARIN SOD (PORK) LOCK FLUSH 100 UNIT/ML IV SOLN: 500.0000 [IU] | Freq: Once | INTRAVENOUS | Status: DC | PRN

## 2018-06-16 MED ORDER — PALONOSETRON HCL INJECTION 0.25 MG/5ML: 0.2500 mg | Freq: Once | INTRAVENOUS | Status: DC

## 2018-06-16 MED ORDER — SODIUM CHLORIDE 0.9% FLUSH
10.0000 mL | Freq: Once | INTRAVENOUS | Status: DC
  Administered 2018-06-16: 10 mL via INTRAVENOUS

## 2018-06-16 MED ORDER — SODIUM CHLORIDE 0.9 % IV SOLN: 2400.0000 mg/m2 | INTRAVENOUS | Status: DC

## 2018-06-16 MED ORDER — SODIUM CHLORIDE 0.9% FLUSH: 10.0000 mL | INTRAVENOUS | Status: DC | PRN

## 2018-06-16 MED ORDER — LEUCOVORIN CALCIUM INJECTION 350 MG: 400.0000 mg/m2 | Freq: Once | INTRAVENOUS | Status: DC

## 2018-06-16 MED ORDER — FLUOROURACIL CHEMO INJECTION 2.5 GM/50ML: 400.0000 mg/m2 | Freq: Once | INTRAVENOUS | Status: DC

## 2018-06-16 MED ORDER — SODIUM CHLORIDE 0.9 % IV SOLN
Freq: Once | INTRAVENOUS | Status: DC
  Filled 2018-06-16: qty 5

## 2018-06-16 NOTE — ED Provider Notes (Signed)
[facility] Provider Note   CSN: [id number] Arrival date & time: 06/16/18  1052    History   Chief Complaint Chief Complaint  Patient presents with  . Fever    Currently on Chemotherapy    HPI Joel Jefferson. is a 43 y.o. male.     Patient is a 43 year old male with past medical history of colon cancer.  He presents today for evaluation of fever.  He was to have chemotherapy this morning, but had a temperature of 100.1 and was referred here.  The patient denies any specific complaints.  He denies any cough, abdominal pain, diarrhea, sore throat.  He denies any ill contacts.  The history is provided by the patient.  Fever  Max temp prior to arrival:  100.1 Temp source:  Oral Severity:  Mild Timing:  Constant Progression:  Unchanged Chronicity:  New Relieved by:  Nothing Worsened by:  Nothing Ineffective treatments:  None tried   Past Medical History:  Diagnosis Date  . Family history of breast cancer   . GERD (gastroesophageal reflux disease)   . History of kidney stones   . Hypercholesterolemia   . Hypertension   . Urethral stricture     Patient Active Problem List   Diagnosis Date Noted  . Genetic testing 04/02/2018  . Family history of breast cancer   . S/P partial colectomy 03/02/2018  . Malignant neoplasm of sigmoid colon ([place])   . Rectal bleeding 02/17/2018  . LLQ pain 02/17/2018  . Abnormal computed tomography of sigmoid colon 02/17/2018  . Constipation 02/17/2018  . Dysphagia 10/13/2014  . GERD (gastroesophageal reflux disease) 10/13/2014    Past Surgical History:  Procedure Laterality Date  . BALLOON DILATION  04/06/2012   Procedure: BALLOON DILATION;  Surgeon: Reece Packer, MD;  Location: [facility];  Service: Urology;  Laterality: N/A;  . BIOPSY  02/24/2018   Procedure: BIOPSY;  Surgeon: Danie Binder, MD;  [facility];  Service: Endoscopy;;  colon  . COLONOSCOPY WITH  PROPOFOL N/A 02/24/2018   Procedure: COLONOSCOPY WITH PROPOFOL;  Surgeon: Danie Binder, MD;  Location: [facility];  Service: Endoscopy;  Laterality: N/A;  9:30am  . ESOPHAGOGASTRODUODENOSCOPY (EGD) WITH PROPOFOL N/A 08/29/2015   Dr. Oneida Alar: possible proximal esophageal web s/p dilation, moderate gastritis, negative eosinophilic esophagitis   . PARTIAL COLECTOMY N/A 03/02/2018   Procedure: PARTIAL COLECTOMY;  Surgeon: Aviva Signs, MD;  Location: [facility];  Service: General;  Laterality: N/A;  . PORTACATH PLACEMENT Left 04/13/2018   Procedure: INSERTION PORT-A-CATH;  Surgeon: Aviva Signs, MD;  Location: [facility];  Service: General;  Laterality: Left;  . SAVORY DILATION N/A 08/29/2015   Procedure: SAVORY DILATION;  Surgeon: Danie Binder, MD;  Location: [facility];  Service: Endoscopy;  Laterality: N/A;  . URETHRAL DILATION    . WISDOM TOOTH EXTRACTION          Home Medications    Prior to Admission medications   Medication Sig Start Date End Date Taking? Authorizing Provider  Ascorbic Acid (VITAMIN C) 1000 MG tablet Take 1,000 mg by mouth daily.   Yes [provider]  esomeprazole (NEXIUM) 40 MG capsule Take 40 mg by mouth daily.    Yes [provider]  fluorouracil CALGB 03500 in sodium chloride 0.9 % 150 mL Inject 6,150 mg into the vein every 14 (fourteen) days. Over 46 hours   Yes [provider]  LEUCOVORIN CALCIUM IV Inject 876 mg  into the vein every 14 (fourteen) days.   Yes [provider]  lidocaine (XYLOCAINE) 2 % solution Use as directed 15 mLs in the mouth or throat 2 (two) times daily.   Yes [provider]  lidocaine-prilocaine (EMLA) cream Apply small amount to port site one hour prior to appointment. Cover with plastic wrap 04/07/18  Yes Derek Jack, MD  lisinopril (PRINIVIL,ZESTRIL) 10 MG tablet Take 10 mg by mouth daily.  09/21/14  Yes [provider]  Magnesium 500 MG CAPS Take 500 mg by mouth daily.     Yes [provider]  Multiple Vitamin (MULTIVITAMIN) tablet Take 1 tablet by mouth daily.   Yes [provider]  ondansetron (ZOFRAN) 8 MG tablet Take 1 tablet (8 mg total) by mouth every 8 (eight) hours as needed for nausea or vomiting (may start taking as needed two days after chemotherapy). 05/19/18  Yes Derek Jack, MD  OXALIPLATIN IV Inject 185 mg into the vein every 14 (fourteen) days.   Yes [provider]  Potassium (POTASSIMIN PO) Take 1 tablet by mouth daily.    Yes [provider]  pravastatin (PRAVACHOL) 20 MG tablet Take 20 mg by mouth daily.  08/30/14  Yes [provider]  promethazine (PHENERGAN) 25 MG tablet Take 1 tablet (25 mg total) by mouth every 6 (six) hours as needed for nausea or vomiting. 05/19/18  Yes Derek Jack, MD  topiramate (TOPAMAX) 100 MG tablet Take 100 mg by mouth 2 (two) times daily.    Yes [provider]  traMADol (ULTRAM) 50 MG tablet Take 1 tablet (50 mg total) by mouth every 6 (six) hours as needed. Patient not taking: Reported on 06/16/2018 03/17/18   Aviva Signs, MD    Family History Family History  Problem Relation Age of Onset  . Dementia Mother   . Dementia Maternal Aunt   . Dementia Maternal Grandmother   . COPD Maternal Grandfather   . Breast cancer Maternal Aunt 42  . Breast cancer Maternal Aunt        dx in her 8s  . Lung cancer Maternal Aunt   . Healthy Son   . Colon cancer Neg Hx     Social History Social History   Tobacco Use  . Smoking status: Former Smoker    Packs/day: 0.25    Years: 9.00    Pack years: 2.25    Types: Cigarettes    Last attempt to quit: 07/27/2011    Years since quitting: 6.8  . Smokeless tobacco: Never Used  Substance Use Topics  . Alcohol use: Yes    Alcohol/week: 6.0 standard drinks    Types: 6 Cans of beer per week  . Drug use: No     Allergies   Patient has no known allergies.   Review of Systems Review of Systems   Constitutional: Positive for fever.  All other systems reviewed and are negative.    Physical Exam Updated Vital Signs BP 128/85   Pulse (!) 110   Temp 100.1 F (37.8 C) (Oral)   Resp (!) 22   Ht 5\' 10"  (1.778 m)   Wt 101.6 kg   SpO2 96%   BMI 32.14 kg/m   Physical Exam Vitals signs and nursing note reviewed.  Constitutional:      General: He is not in acute distress.    Appearance: He is well-developed. He is not diaphoretic.  HENT:     Head: Normocephalic and atraumatic.     Right Ear:  Tympanic membrane normal.     Left Ear: Tympanic membrane normal.     Mouth/Throat:     Mouth: Mucous membranes are moist.     Pharynx: No oropharyngeal exudate or posterior oropharyngeal erythema.  Neck:     Musculoskeletal: Normal range of motion and neck supple.  Cardiovascular:     Rate and Rhythm: Normal rate and regular rhythm.     Heart sounds: No murmur. No friction rub.  Pulmonary:     Effort: Pulmonary effort is normal. No respiratory distress.     Breath sounds: Normal breath sounds. No wheezing or rales.  Abdominal:     General: Bowel sounds are normal. There is no distension.     Palpations: Abdomen is soft.     Tenderness: There is no abdominal tenderness.  Musculoskeletal: Normal range of motion.  Skin:    General: Skin is warm and dry.  Neurological:     General: No focal deficit present.     Mental Status: He is alert and oriented to person, place, and time.     Cranial Nerves: No cranial nerve deficit.     Coordination: Coordination normal.      ED Treatments / Results  Labs (all labs ordered are listed, but only abnormal results are displayed) Labs Reviewed  URINALYSIS, ROUTINE W REFLEX MICROSCOPIC - Abnormal; Notable for the following components:      Result Value   APPearance HAZY (*)    Glucose, UA 50 (*)    All other components within normal limits  CULTURE, BLOOD (ROUTINE X 2)  CULTURE, BLOOD (ROUTINE X 2)  INFLUENZA PANEL BY PCR (TYPE A & B)     EKG EKG Interpretation  Date/Time:  Tuesday June 16 2018 11:02:07 EDT Ventricular Rate:  128 PR Interval:  182 QRS Duration: 76 QT Interval:  274 QTC Calculation: 400 R Axis:   23 Text Interpretation:  Sinus tachycardia Otherwise normal ECG Confirmed by Veryl Speak [phone]) on 06/16/2018 11:27:03 AM   Radiology Dg Chest 2 View  Result Date: 06/16/2018 CLINICAL DATA:  Fever EXAM: CHEST - 2 VIEW COMPARISON:  April 13, 2018 FINDINGS: Port-A-Cath tip is in the superior vena cava. No pneumothorax. The lungs are clear. The heart size and pulmonary vascularity are normal. No adenopathy. No bone lesions. IMPRESSION: No edema or consolidation.  Port-A-Cath tip in superior vena cava. Electronically Signed   By: Lowella Grip III M.D.   On: 06/16/2018 12:53    Procedures Procedures (including critical care time)  Medications Ordered in ED Medications - No data to display   Initial Impression / Assessment and Plan / ED Course  I have reviewed the triage vital signs and the nursing notes.  Pertinent labs & imaging results that were available during my care of the patient were reviewed by me and considered in my medical decision making (see chart for details).  Patient presents here with complaints of fever.  He has a history of colon cancer and is undergoing chemotherapy.  He went in for his chemotherapy today, but was sent here after his temperature was found to be 100.1.  He has no specific complaints and his white count does not show that he is neutropenic.  I see nothing on physical examination that would explain his fever.  Blood cultures, urinalysis, urine culture, flu swab, and chest x-ray were obtained.  Blood cultures remain pending, however all other studies are unremarkable.  Patient is hemodynamically stable and I believe appropriate for discharge.  He is  to return as needed.  He will see oncology in the morning.  Final Clinical Impressions(s) / ED Diagnoses   Final  diagnoses:  None    ED Discharge Orders    None       Veryl Speak, MD 06/16/18 1402

## 2018-06-16 NOTE — Progress Notes (Signed)
Platelets 90 and reviewed with Dr. Delton Coombes.  Ok to treat today verbal order Dr. Delton Coombes.    Dr. Delton Coombes in to see the patient with exam.  Ok to treat today with Heart rate in the 100's.  Patient stated he has not taken his blood pressure medicine this morning.  He normally takes them after treatment.   Rechecked Vital signs after exam with Dr. Delton Coombes with increase in Temperature and Heart rate.  After further questioning of the patient he stated he woke with body aches and "cold" this morning.  Reviewed with Dr. Delton Coombes with verbal order to hold treatment this week and reschedule for next week.  Reviewed with the patient and when to go to the ER and to have good hydration over the next few days.  Patient verbalized understanding but was upset because this would mess up his treatment schedule with his work schedule, and his girlfriends schedule and transportation with his children.  Explained to the patient the importance of no s/s of infection with treatment.  Patient discharged with no s/s distress noted.

## 2018-06-16 NOTE — Discharge Instructions (Addendum)
Tylenol 1000 mg rotated with ibuprofen 600 mg every 4 hours as needed for fever.  We will call you if your cultures indicate you require further treatment.  Return to the ER if symptoms significantly worsen or change.

## 2018-06-16 NOTE — ED Triage Notes (Signed)
Scheduled for Chemotherapy today but had elevated temp before therapy.  Sent here for [place] for eval.  C/o bodyache,rates pain 3/10.  Denies any other issues.

## 2018-06-17 ENCOUNTER — Inpatient Hospital Stay (HOSPITAL_COMMUNITY): Payer: BLUE CROSS/BLUE SHIELD

## 2018-06-17 ENCOUNTER — Inpatient Hospital Stay (HOSPITAL_BASED_OUTPATIENT_CLINIC_OR_DEPARTMENT_OTHER): Payer: BLUE CROSS/BLUE SHIELD | Admitting: Hematology

## 2018-06-17 ENCOUNTER — Other Ambulatory Visit: Payer: Self-pay

## 2018-06-17 ENCOUNTER — Encounter (HOSPITAL_COMMUNITY): Payer: Self-pay | Admitting: Hematology

## 2018-06-17 VITALS — BP 118/73 | HR 78 | Temp 98.4°F | Resp 18

## 2018-06-17 DIAGNOSIS — C187 Malignant neoplasm of sigmoid colon: Secondary | ICD-10-CM | POA: Diagnosis not present

## 2018-06-17 DIAGNOSIS — Z5111 Encounter for antineoplastic chemotherapy: Secondary | ICD-10-CM | POA: Diagnosis not present

## 2018-06-17 DIAGNOSIS — Z452 Encounter for adjustment and management of vascular access device: Secondary | ICD-10-CM | POA: Diagnosis not present

## 2018-06-17 MED ORDER — LORAZEPAM 2 MG/ML IJ SOLN
1.0000 mg | Freq: Once | INTRAMUSCULAR | Status: AC
  Administered 2018-06-17: 1 mg via INTRAVENOUS
  Filled 2018-06-17: qty 1

## 2018-06-17 MED ORDER — FLUOROURACIL CHEMO INJECTION 2.5 GM/50ML
400.0000 mg/m2 | Freq: Once | INTRAVENOUS | Status: AC
  Administered 2018-06-17: 900 mg via INTRAVENOUS
  Filled 2018-06-17: qty 18

## 2018-06-17 MED ORDER — DEXTROSE 5 % IV SOLN
Freq: Once | INTRAVENOUS | Status: AC
  Administered 2018-06-17: 11:00:00 via INTRAVENOUS

## 2018-06-17 MED ORDER — SODIUM CHLORIDE 0.9 % IV SOLN
2400.0000 mg/m2 | INTRAVENOUS | Status: DC
  Administered 2018-06-17: 5250 mg via INTRAVENOUS
  Filled 2018-06-17: qty 105

## 2018-06-17 MED ORDER — PALONOSETRON HCL INJECTION 0.25 MG/5ML
0.2500 mg | Freq: Once | INTRAVENOUS | Status: AC
  Administered 2018-06-17: 0.25 mg via INTRAVENOUS
  Filled 2018-06-17: qty 5

## 2018-06-17 MED ORDER — SODIUM CHLORIDE 0.9% FLUSH
10.0000 mL | INTRAVENOUS | Status: DC | PRN
  Administered 2018-06-17: 10 mL
  Filled 2018-06-17: qty 10

## 2018-06-17 MED ORDER — LEUCOVORIN CALCIUM INJECTION 350 MG
900.0000 mg | Freq: Once | INTRAVENOUS | Status: AC
  Administered 2018-06-17: 900 mg via INTRAVENOUS
  Filled 2018-06-17: qty 45

## 2018-06-17 MED ORDER — OXALIPLATIN CHEMO INJECTION 100 MG/20ML
85.0000 mg/m2 | Freq: Once | INTRAVENOUS | Status: AC
  Administered 2018-06-17: 185 mg via INTRAVENOUS
  Filled 2018-06-17: qty 37

## 2018-06-17 MED ORDER — OCTREOTIDE ACETATE 30 MG IM KIT
PACK | INTRAMUSCULAR | Status: AC
  Filled 2018-06-17: qty 1

## 2018-06-17 MED ORDER — DEXTROSE 5 % IV SOLN
Freq: Once | INTRAVENOUS | Status: AC
  Administered 2018-06-17: 12:00:00 via INTRAVENOUS

## 2018-06-17 MED ORDER — SODIUM CHLORIDE 0.9 % IV SOLN
Freq: Once | INTRAVENOUS | Status: AC
  Administered 2018-06-17: 11:00:00 via INTRAVENOUS
  Filled 2018-06-17: qty 5

## 2018-06-17 NOTE — Patient Instructions (Signed)
[facility] Discharge Instructions for Patients Receiving Chemotherapy  Today you received the following chemotherapy agents  If you develop nausea and vomiting that is not controlled by your nausea medication, call the clinic.   BELOW ARE SYMPTOMS THAT SHOULD BE REPORTED IMMEDIATELY:  *FEVER GREATER THAN 100.5 F  *CHILLS WITH OR WITHOUT FEVER  NAUSEA AND VOMITING THAT IS NOT CONTROLLED WITH YOUR NAUSEA MEDICATION  *UNUSUAL SHORTNESS OF BREATH  *UNUSUAL BRUISING OR BLEEDING  TENDERNESS IN MOUTH AND THROAT WITH OR WITHOUT PRESENCE OF ULCERS  *URINARY PROBLEMS  *BOWEL PROBLEMS  UNUSUAL RASH Items with * indicate a potential emergency and should be followed up as soon as possible.  Feel free to call the clinic should you have any questions or concerns. The clinic phone number is [phone].  Please show the CHEMO ALERT CARD at check-in to the Emergency Department and triage nurse.   

## 2018-06-17 NOTE — Assessment & Plan Note (Addendum)
1.  Stage III (pT3 pN2b) sigmoid colon adenocarcinoma, MSI-stable: - Patient had left lower quadrant abdominal pain since October, CT scan done at [facility] of the abdomen pelvis showed sigmoid colon thickening. -he underwent colonoscopy on 03/06/2018 showing a fungating infiltrative and polypoid partially obstructing large mass in the sigmoid colon between 20-25 cm from the anal verge.  This was consistent with colon cancer. - He underwent sigmoid colon segmental resection on 03/02/2018 by Dr. Arnoldo Morale.  This showed invasive adenocarcinoma, well-differentiated, 3.8 cm, tumor invades through muscularis propria, resection margins negative, 14/35 lymph nodes positive, satellite nodules x3, PT 3, PN2B, MMR preserved. - CT of the chest on 02/26/2018 shows 2 small pulmonary nodules, 7 x 5 mm subpleural nodule in the right major fissure and the 4 mm left lower lobe nodule.  Mild hepatic steatosis was seen. - PET/CT scan on 03/30/2018 shows bilateral hypermetabolic pelvic sidewall lymph nodes, subcentimeter, not in the initial distribution pattern for sigmoid primary.  Lymph nodes are in the bilateral external iliac and right inguinal lymph node (7 mm).  They are most likely reactive.  A pelvic CT in 3 months is recommended.  Given normal CEA level, they are more likely reactive. -Adjuvant FOLFOX therapy started on 04/21/2018.  - Cycle 4 on 06/01/2018. -She continues to have cold sensitivity lasting for 4 days after each cycle.  Denies any major side effects from it. - He had fever of 101.2 yesterday.  He went to the ER and blood cultures were done.  Chest x-ray was normal.  Influenza swab was negative for a and B.  As soon as he went home, fever has come down.  Today feels fine without any body aches or chills. -We reviewed his blood work which was grossly within normal limits.  He may proceed with cycle 5 today.  Should he develop any fevers, he was told to go to the ER. -We will add Ativan to antiemetic regimen.   He will keep his appointment in 2 weeks.  2.  Family history: -2 maternal aunts had breast cancer.  No family history of colorectal cancer. - I have also recommended genetic testing because of his young age at diagnosis.

## 2018-06-17 NOTE — Progress Notes (Signed)
Patient seen by Dr. Delton Coombes with lab review and emergency visit review from yesterday with verbal order ok to treat today.    Vital signs rechecked before treatment.    Patient tolerated chemotherapy with no complaints voiced.  Port site clean and dry with no bruising or swelling noted at site.  Good blood return noted before and after administration of chemotherapy.  Chemotherapy pump connected with no alarms noted and dressing intact.   Patient left ambulatory with VSS and no s/s of distress noted.

## 2018-06-17 NOTE — Patient Instructions (Addendum)
[place] at [facility] Discharge Instructions  You were seen today by Dr. Delton Coombes. He went over your recent lab results and how you're feeling. He will see you back in the clinic as scheduled for labs and follow up.   Thank you for choosing [place] at [facility] to provide your oncology and hematology care.  To afford each patient quality time with our provider, please arrive at least 15 minutes before your scheduled appointment time.   If you have a lab appointment with the [facility] please come in thru the  Main Entrance and check in at the main information desk  You need to re-schedule your appointment should you arrive 10 or more minutes late.  We strive to give you quality time with our providers, and arriving late affects you and other patients whose appointments are after yours.  Also, if you no show three or more times for appointments you may be dismissed from the clinic at the providers discretion.     Again, thank you for choosing [facility].  Our hope is that these requests will decrease the amount of time that you wait before being seen by our physicians.       _____________________________________________________________  Should you have questions after your visit to [facility], please contact our office at [phone] between the hours of 8:00 a.m. and 4:30 p.m.  Voicemails left after 4:00 p.m. will not be returned until the following business day.  For prescription refill requests, have your pharmacy contact our office and allow 72 hours.    Cancer Center Support Programs:   > Cancer Support Group  2nd Tuesday of the month 1pm-2pm, Journey Room

## 2018-06-17 NOTE — Progress Notes (Signed)
[place], [place] [address]   CLINIC:  Medical Oncology/Hematology  PCP:  Sharilyn Sites, [place] [address] [phone]   REASON FOR VISIT:  Follow-up for stage III colon cancer  CURRENT THERAPY:Folfox every 2 weeks  BRIEF ONCOLOGIC HISTORY:    Malignant neoplasm of sigmoid colon [facility])    Initial Diagnosis    Cancer of sigmoid colon ([place])    03/27/2018 Genetic Testing    ALK c.350C>G VUS identified on the multicancer panel.  The Multi-Gene Panel offered by [facility] includes sequencing and/or deletion duplication testing of the following 85 genes: AIP, ALK, APC, ATM, AXIN2,BAP1,  BARD1, BLM, BMPR1A, BRCA1, BRCA2, BRIP1, CASR, CDC73, CDH1, CDK4, CDKN1B, CDKN1C, CDKN2A (p14ARF), CDKN2A (p16INK4a), CEBPA, CHEK2, CTNNA1, DICER1, DIS3L2, EGFR (c.2369C>T, p.Thr790Met variant only), EPCAM (Deletion/duplication testing only), FH, FLCN, GATA2, GPC3, GREM1 (Promoter region deletion/duplication testing only), HOXB13 (c.251G>A, p.Gly84Glu), HRAS, KIT, MAX, MEN1, MET, MITF (c.952G>A, p.Glu318Lys variant only), MLH1, MSH2, MSH3, MSH6, MUTYH, NBN, NF1, NF2, NTHL1, PALB2, PDGFRA, PHOX2B, PMS2, POLD1, POLE, POT1, PRKAR1A, PTCH1, PTEN, RAD50, RAD51C, RAD51D, RB1, RECQL4, RET, RNF43, RUNX1, SDHAF2, SDHA (sequence changes only), SDHB, SDHC, SDHD, SMAD4, SMARCA4, SMARCB1, SMARCE1, STK11, SUFU, TERC, TERT, TMEM127, TP53, TSC1, TSC2, VHL, WRN and WT1.   The report date is 03/27/2018.    04/21/2018 -  Chemotherapy    The patient had palonosetron (ALOXI) injection 0.25 mg, 0.25 mg, Intravenous,  Once, 6 of 12 cycles Administration: 0.25 mg (04/21/2018), 0.25 mg (05/05/2018), 0.25 mg (05/19/2018), 0.25 mg (06/02/2018), 0.25 mg (06/17/2018) leucovorin 800 mg in dextrose 5 % 250 mL infusion, 876 mg, Intravenous,  Once, 6 of 12 cycles Administration: 800 mg (04/21/2018), 900 mg (05/05/2018), 800 mg (05/19/2018), 800 mg (06/02/2018), 900 mg  (06/17/2018) oxaliplatin (ELOXATIN) 185 mg in dextrose 5 % 500 mL chemo infusion, 85 mg/m2 = 185 mg, Intravenous,  Once, 6 of 12 cycles Administration: 185 mg (04/21/2018), 185 mg (05/05/2018), 185 mg (05/19/2018), 185 mg (06/02/2018), 185 mg (06/17/2018) fluorouracil (ADRUCIL) chemo injection 900 mg, 400 mg/m2 = 900 mg, Intravenous,  Once, 6 of 12 cycles Administration: 900 mg (04/21/2018), 900 mg (05/05/2018), 900 mg (05/19/2018), 900 mg (06/02/2018), 900 mg (06/17/2018) fosaprepitant (EMEND) 150 mg, dexamethasone (DECADRON) 12 mg in sodium chloride 0.9 % 145 mL IVPB, , Intravenous,  Once, 3 of 9 cycles Administration:  (06/02/2018),  (06/17/2018) fluorouracil (ADRUCIL) 5,250 mg in sodium chloride 0.9 % 145 mL chemo infusion, 2,400 mg/m2 = 5,250 mg, Intravenous, 1 Day/Dose, 6 of 12 cycles Administration: 5,250 mg (04/21/2018), 5,250 mg (05/05/2018), 5,250 mg (05/19/2018), 5,250 mg (06/02/2018), 5,250 mg (06/17/2018)  for chemotherapy treatment.        INTERVAL HISTORY:  Mr. Baumler 43 y.o. male returns for routine follow-up and consideration for next cycle of chemotherapy. He is here today by himself. He states he feels better today. He staets that he still has cold sensitivity in his hands. Denies any nausea, vomiting, or diarrhea. Denies any new pains. Had not noticed any recent bleeding such as epistaxis, hematuria or hematochezia. Denies recent chest pain on exertion, shortness of breath on minimal exertion, pre-syncopal episodes, or palpitations. Denies any numbness or tingling in hands or feet. Denies any recent fevers, infections, or recent hospitalizations. Patient reports appetite at 100% and energy level at 75%. Overall he feels ready for his treatment today.     REVIEW OF SYSTEMS:  Review of Systems  HENT:   Positive for mouth sores.   Gastrointestinal: Positive for  constipation and nausea.     PAST MEDICAL/SURGICAL HISTORY:  Past Medical History:  Diagnosis Date  . Family history of breast  cancer   . GERD (gastroesophageal reflux disease)   . History of kidney stones   . Hypercholesterolemia   . Hypertension   . Urethral stricture    Past Surgical History:  Procedure Laterality Date  . BALLOON DILATION  04/06/2012   Procedure: BALLOON DILATION;  Surgeon: Reece Packer, MD;  Location: [facility];  Service: Urology;  Laterality: N/A;  . BIOPSY  02/24/2018   Procedure: BIOPSY;  Surgeon: Danie Binder, MD;  [facility];  Service: Endoscopy;;  colon  . COLONOSCOPY WITH PROPOFOL N/A 02/24/2018   Procedure: COLONOSCOPY WITH PROPOFOL;  Surgeon: Danie Binder, MD;  [facility];  Service: Endoscopy;  Laterality: N/A;  9:30am  . ESOPHAGOGASTRODUODENOSCOPY (EGD) WITH PROPOFOL N/A 08/29/2015   Dr. Oneida Alar: possible proximal esophageal web s/p dilation, moderate gastritis, negative eosinophilic esophagitis   . PARTIAL COLECTOMY N/A 03/02/2018   Procedure: PARTIAL COLECTOMY;  Surgeon: Aviva Signs, MD;  Location: [facility];  Service: General;  Laterality: N/A;  . PORTACATH PLACEMENT Left 04/13/2018   Procedure: INSERTION PORT-A-CATH;  Surgeon: Aviva Signs, MD;  Location: [facility];  Service: General;  Laterality: Left;  . SAVORY DILATION N/A 08/29/2015   Procedure: SAVORY DILATION;  Surgeon: Danie Binder, MD;  [facility];  Service: Endoscopy;  Laterality: N/A;  . URETHRAL DILATION    . WISDOM TOOTH EXTRACTION       SOCIAL HISTORY:  Social History   Socioeconomic History  . Marital status: Divorced    Spouse name: Not on file  . Number of children: Not on file  . Years of education: Not on file  . Highest education level: Not on file  Occupational History  . Not on file  Social Needs  . Financial resource strain: Not on file  . Food insecurity:    Worry: Not on file    Inability: Not on file  . Transportation needs:    Medical: Not on file    Non-medical: Not on file  Tobacco Use  . Smoking status: Former Smoker     Packs/day: 0.25    Years: 9.00    Pack years: 2.25    Types: Cigarettes    Last attempt to quit: 07/27/2011    Years since quitting: 6.8  . Smokeless tobacco: Never Used  Substance and Sexual Activity  . Alcohol use: Yes    Alcohol/week: 6.0 standard drinks    Types: 6 Cans of beer per week  . Drug use: No  . Sexual activity: Yes    Birth control/protection: None  Lifestyle  . Physical activity:    Days per week: Not on file    Minutes per session: Not on file  . Stress: Not on file  Relationships  . Social connections:    Talks on phone: Not on file    Gets together: Not on file    Attends religious service: Not on file    Active member of club or organization: Not on file    Attends meetings of clubs or organizations: Not on file    Relationship status: Not on file  . Intimate partner violence:    Fear of current or ex partner: Not on file    Emotionally abused: Not on file    Physically abused: Not on file    Forced sexual activity: Not on  file  Other Topics Concern  . Not on file  Social History Narrative  . Not on file    FAMILY HISTORY:  Family History  Problem Relation Age of Onset  . Dementia Mother   . Dementia Maternal Aunt   . Dementia Maternal Grandmother   . COPD Maternal Grandfather   . Breast cancer Maternal Aunt 42  . Breast cancer Maternal Aunt        dx in her 78s  . Lung cancer Maternal Aunt   . Healthy Son   . Colon cancer Neg Hx     CURRENT MEDICATIONS:  Outpatient Encounter Medications as of 06/17/2018  Medication Sig  . Ascorbic Acid (VITAMIN C) 1000 MG tablet Take 1,000 mg by mouth daily.  Marland Kitchen esomeprazole (NEXIUM) 40 MG capsule Take 40 mg by mouth daily.   . fluorouracil CALGB 97026 in sodium chloride 0.9 % 150 mL Inject 6,150 mg into the vein every 14 (fourteen) days. Over 46 hours  . LEUCOVORIN CALCIUM IV Inject 876 mg into the vein every 14 (fourteen) days.  Marland Kitchen lidocaine (XYLOCAINE) 2 % solution Use as directed 15 mLs in the mouth or  throat 2 (two) times daily.  Marland Kitchen lidocaine-prilocaine (EMLA) cream Apply small amount to port site one hour prior to appointment. Cover with plastic wrap  . lisinopril (PRINIVIL,ZESTRIL) 10 MG tablet Take 10 mg by mouth daily.   . Magnesium 500 MG CAPS Take 500 mg by mouth daily.   . Multiple Vitamin (MULTIVITAMIN) tablet Take 1 tablet by mouth daily.  . ondansetron (ZOFRAN) 8 MG tablet Take 1 tablet (8 mg total) by mouth every 8 (eight) hours as needed for nausea or vomiting (may start taking as needed two days after chemotherapy).  . OXALIPLATIN IV Inject 185 mg into the vein every 14 (fourteen) days.  . Potassium (POTASSIMIN PO) Take 1 tablet by mouth daily.   . pravastatin (PRAVACHOL) 20 MG tablet Take 20 mg by mouth daily.   . promethazine (PHENERGAN) 25 MG tablet Take 1 tablet (25 mg total) by mouth every 6 (six) hours as needed for nausea or vomiting.  . topiramate (TOPAMAX) 100 MG tablet Take 100 mg by mouth 2 (two) times daily.   . [DISCONTINUED] traMADol (ULTRAM) 50 MG tablet Take 1 tablet (50 mg total) by mouth every 6 (six) hours as needed. (Patient not taking: Reported on 06/16/2018)   No facility-administered encounter medications on file as of 06/17/2018.     ALLERGIES:  No Known Allergies   PHYSICAL EXAM:  ECOG Performance status: 1  Reviewed his vitals.  Temperature is 98.2.  Pulse rate is 85.  Respiratory rate is 18.  Blood pressure is 143/92.  Saturations 100%.  Physical Exam Constitutional:      Appearance: Normal appearance.  Cardiovascular:     Rate and Rhythm: Normal rate and regular rhythm.  Pulmonary:     Effort: Pulmonary effort is normal.     Breath sounds: Normal breath sounds.  Abdominal:     General: Bowel sounds are normal. There is no distension.     Palpations: Abdomen is soft.  Skin:    General: Skin is warm.  Neurological:     General: No focal deficit present.     Mental Status: He is alert and oriented to person, place, and time.  Psychiatric:         Mood and Affect: Mood normal.        Behavior: Behavior normal.      LABORATORY DATA:  I have reviewed the labs as listed.  CBC    Component Value Date/Time   WBC 4.8 06/16/2018 0757   RBC 4.72 06/16/2018 0757   HGB 14.0 06/16/2018 0757   HCT 40.9 06/16/2018 0757   PLT 90 (L) 06/16/2018 0757   MCV 86.7 06/16/2018 0757   MCH 29.7 06/16/2018 0757   MCHC 34.2 06/16/2018 0757   RDW 15.4 06/16/2018 0757   LYMPHSABS 0.5 (L) 06/16/2018 0757   MONOABS 0.3 06/16/2018 0757   EOSABS 0.0 06/16/2018 0757   BASOSABS 0.0 06/16/2018 0757   CMP Latest Ref Rng & Units 06/16/2018 06/02/2018 05/19/2018  Glucose 70 - 99 mg/dL 226(H) 291(H) 175(H)  BUN 6 - 20 mg/dL '19 13 16  ' Creatinine 0.61 - 1.24 mg/dL 1.19 0.89 0.98  Sodium 135 - 145 mmol/L 136 137 139  Potassium 3.5 - 5.1 mmol/L 3.9 4.4 4.0  Chloride 98 - 111 mmol/L 109 108 109  CO2 22 - 32 mmol/L 20(L) 22 23  Calcium 8.9 - 10.3 mg/dL 8.7(L) 8.6(L) 9.0  Total Protein 6.5 - 8.1 g/dL 6.6 6.4(L) 6.8  Total Bilirubin 0.3 - 1.2 mg/dL 0.5 0.4 0.5  Alkaline Phos 38 - 126 U/L 52 53 48  AST 15 - 41 U/L 28 28 32  ALT 0 - 44 U/L 43 46(H) 52(H)       DIAGNOSTIC IMAGING:  I have independently reviewed the scans and discussed with the patient.   I have reviewed Venita Lick LPN's note and agree with the documentation.  I personally performed a face-to-face visit, made revisions and my assessment and plan is as follows.    ASSESSMENT & PLAN:   Malignant neoplasm of sigmoid colon ([facility]) 1.  Stage III (pT3 pN2b) sigmoid colon adenocarcinoma, MSI-stable: - Patient had left lower quadrant abdominal pain since October, CT scan done at [facility] of the abdomen pelvis showed sigmoid colon thickening. -he underwent colonoscopy on 03/06/2018 showing a fungating infiltrative and polypoid partially obstructing large mass in the sigmoid colon between 20-25 cm from the anal verge.  This was consistent with colon cancer. - He underwent sigmoid colon  segmental resection on 03/02/2018 by Dr. Arnoldo Morale.  This showed invasive adenocarcinoma, well-differentiated, 3.8 cm, tumor invades through muscularis propria, resection margins negative, 14/35 lymph nodes positive, satellite nodules x3, PT 3, PN2B, MMR preserved. - CT of the chest on 02/26/2018 shows 2 small pulmonary nodules, 7 x 5 mm subpleural nodule in the right major fissure and the 4 mm left lower lobe nodule.  Mild hepatic steatosis was seen. - PET/CT scan on 03/30/2018 shows bilateral hypermetabolic pelvic sidewall lymph nodes, subcentimeter, not in the initial distribution pattern for sigmoid primary.  Lymph nodes are in the bilateral external iliac and right inguinal lymph node (7 mm).  They are most likely reactive.  A pelvic CT in 3 months is recommended.  Given normal CEA level, they are more likely reactive. -Adjuvant FOLFOX therapy started on 04/21/2018.  - Cycle 4 on 06/01/2018. -She continues to have cold sensitivity lasting for 4 days after each cycle.  Denies any major side effects from it. - He had fever of 101.2 yesterday.  He went to the ER and blood cultures were done.  Chest x-ray was normal.  Influenza swab was negative for a and B.  As soon as he went home, fever has come down.  Today feels fine without any body aches or chills. -We reviewed his blood work which was grossly within normal limits.  He may proceed with  cycle 5 today.  Should he develop any fevers, he was told to go to the ER. -We will add Ativan to antiemetic regimen.  He will keep his appointment in 2 weeks.  2.  Family history: -2 maternal aunts had breast cancer.  No family history of colorectal cancer. - I have also recommended genetic testing because of his young age at diagnosis.      Orders placed this encounter:  No orders of the defined types were placed in this encounter.     Derek Jack, MD [place] [phone]

## 2018-06-18 ENCOUNTER — Encounter (HOSPITAL_COMMUNITY): Payer: Self-pay

## 2018-06-19 ENCOUNTER — Inpatient Hospital Stay (HOSPITAL_COMMUNITY): Payer: BLUE CROSS/BLUE SHIELD

## 2018-06-19 ENCOUNTER — Encounter (HOSPITAL_COMMUNITY): Payer: Self-pay

## 2018-06-19 ENCOUNTER — Other Ambulatory Visit: Payer: Self-pay

## 2018-06-19 VITALS — BP 114/76 | HR 81 | Temp 98.3°F | Resp 18

## 2018-06-19 DIAGNOSIS — C187 Malignant neoplasm of sigmoid colon: Secondary | ICD-10-CM | POA: Diagnosis not present

## 2018-06-19 DIAGNOSIS — Z452 Encounter for adjustment and management of vascular access device: Secondary | ICD-10-CM | POA: Diagnosis not present

## 2018-06-19 DIAGNOSIS — Z5111 Encounter for antineoplastic chemotherapy: Secondary | ICD-10-CM | POA: Diagnosis not present

## 2018-06-19 MED ORDER — HEPARIN SOD (PORK) LOCK FLUSH 100 UNIT/ML IV SOLN
500.0000 [IU] | Freq: Once | INTRAVENOUS | Status: AC | PRN
  Administered 2018-06-19: 500 [IU]

## 2018-06-19 MED ORDER — SODIUM CHLORIDE 0.9% FLUSH
10.0000 mL | INTRAVENOUS | Status: DC | PRN
  Administered 2018-06-19: 10 mL
  Filled 2018-06-19: qty 10

## 2018-06-19 NOTE — Patient Instructions (Signed)
[facility] at [facility] Discharge Instructions  5FU pump discontinued today with portacath flushed per protocol. Follow-up as scheduled. Call clinic for any questions or concerns   Thank you for choosing [facility] at [facility] to provide your oncology and hematology care.  To afford each patient quality time with our provider, please arrive at least 15 minutes before your scheduled appointment time.   If you have a lab appointment with the Cancer Center please come in thru the  Main Entrance and check in at the main information desk  You need to re-schedule your appointment should you arrive 10 or more minutes late.  We strive to give you quality time with our providers, and arriving late affects you and other patients whose appointments are after yours.  Also, if you no show three or more times for appointments you may be dismissed from the clinic at the providers discretion.     Again, thank you for choosing [facility].  Our hope is that these requests will decrease the amount of time that you wait before being seen by our physicians.       _____________________________________________________________  Should you have questions after your visit to [facility], please contact our office at [phone] between the hours of 8:00 a.m. and 4:30 p.m.  Voicemails left after 4:00 p.m. will not be returned until the following business day.  For prescription refill requests, have your pharmacy contact our office and allow 72 hours.    Cancer Center Support Programs:   > Cancer Support Group  2nd Tuesday of the month 1pm-2pm, Journey Room   

## 2018-06-19 NOTE — Progress Notes (Signed)
Joel Jefferson. tolerated 5FU pump well without complaints or incident. 5FU pump discontinued with portacath flushed with 10 ml NS and 5 ml Heparin easily per protocol then de-accessed. VSS Pt discharged self ambulatory in satisfactory condition

## 2018-06-20 DIAGNOSIS — C187 Malignant neoplasm of sigmoid colon: Secondary | ICD-10-CM | POA: Diagnosis not present

## 2018-06-22 LAB — CULTURE, BLOOD (ROUTINE X 2)
Culture: NO GROWTH
Culture: NO GROWTH
Special Requests: ADEQUATE
Special Requests: ADEQUATE

## 2018-06-30 ENCOUNTER — Encounter (HOSPITAL_COMMUNITY): Payer: Self-pay | Admitting: Hematology

## 2018-06-30 ENCOUNTER — Other Ambulatory Visit: Payer: Self-pay

## 2018-06-30 ENCOUNTER — Inpatient Hospital Stay (HOSPITAL_COMMUNITY): Payer: BLUE CROSS/BLUE SHIELD

## 2018-06-30 ENCOUNTER — Inpatient Hospital Stay (HOSPITAL_BASED_OUTPATIENT_CLINIC_OR_DEPARTMENT_OTHER): Payer: BLUE CROSS/BLUE SHIELD | Admitting: Hematology

## 2018-06-30 ENCOUNTER — Encounter (HOSPITAL_COMMUNITY): Payer: Self-pay

## 2018-06-30 ENCOUNTER — Other Ambulatory Visit (HOSPITAL_COMMUNITY): Payer: Self-pay

## 2018-06-30 ENCOUNTER — Encounter (HOSPITAL_COMMUNITY): Payer: Self-pay | Admitting: *Deleted

## 2018-06-30 VITALS — BP 136/87 | HR 87 | Temp 98.3°F | Resp 18 | Wt 223.3 lb

## 2018-06-30 DIAGNOSIS — Z5111 Encounter for antineoplastic chemotherapy: Secondary | ICD-10-CM | POA: Diagnosis not present

## 2018-06-30 DIAGNOSIS — C187 Malignant neoplasm of sigmoid colon: Secondary | ICD-10-CM

## 2018-06-30 DIAGNOSIS — Z452 Encounter for adjustment and management of vascular access device: Secondary | ICD-10-CM | POA: Diagnosis not present

## 2018-06-30 LAB — CBC WITH DIFFERENTIAL/PLATELET
Abs Immature Granulocytes: 0.01 10*3/uL (ref 0.00–0.07)
Basophils Absolute: 0 10*3/uL (ref 0.0–0.1)
Basophils Relative: 1 %
Eosinophils Absolute: 0.1 10*3/uL (ref 0.0–0.5)
Eosinophils Relative: 2 %
HEMATOCRIT: 39.6 % (ref 39.0–52.0)
Hemoglobin: 13.5 g/dL (ref 13.0–17.0)
Immature Granulocytes: 0 %
Lymphocytes Relative: 36 %
Lymphs Abs: 1.3 10*3/uL (ref 0.7–4.0)
MCH: 29.5 pg (ref 26.0–34.0)
MCHC: 34.1 g/dL (ref 30.0–36.0)
MCV: 86.5 fL (ref 80.0–100.0)
Monocytes Absolute: 0.3 10*3/uL (ref 0.1–1.0)
Monocytes Relative: 9 %
Neutro Abs: 1.9 10*3/uL (ref 1.7–7.7)
Neutrophils Relative %: 52 %
Platelets: 124 10*3/uL — ABNORMAL LOW (ref 150–400)
RBC: 4.58 MIL/uL (ref 4.22–5.81)
RDW: 15.9 % — ABNORMAL HIGH (ref 11.5–15.5)
WBC: 3.7 10*3/uL — ABNORMAL LOW (ref 4.0–10.5)
nRBC: 0 % (ref 0.0–0.2)

## 2018-06-30 LAB — COMPREHENSIVE METABOLIC PANEL
ALT: 57 U/L — ABNORMAL HIGH (ref 0–44)
AST: 34 U/L (ref 15–41)
Albumin: 3.9 g/dL (ref 3.5–5.0)
Alkaline Phosphatase: 63 U/L (ref 38–126)
Anion gap: 7 (ref 5–15)
BUN: 14 mg/dL (ref 6–20)
CO2: 22 mmol/L (ref 22–32)
Calcium: 8.8 mg/dL — ABNORMAL LOW (ref 8.9–10.3)
Chloride: 109 mmol/L (ref 98–111)
Creatinine, Ser: 0.86 mg/dL (ref 0.61–1.24)
GFR calc Af Amer: >60 mL/min (ref >60)
GFR calc non Af Amer: >60 mL/min (ref >60)
Glucose, Bld: 315 mg/dL — ABNORMAL HIGH (ref 70–99)
Potassium: 3.8 mmol/L (ref 3.5–5.1)
Sodium: 138 mmol/L (ref 135–145)
Total Bilirubin: 0.5 mg/dL (ref 0.3–1.2)
Total Protein: 6.4 g/dL — ABNORMAL LOW (ref 6.5–8.1)

## 2018-06-30 MED ORDER — OXALIPLATIN CHEMO INJECTION 100 MG/20ML
85.0000 mg/m2 | Freq: Once | INTRAVENOUS | Status: AC
  Administered 2018-06-30: 185 mg via INTRAVENOUS
  Filled 2018-06-30: qty 37

## 2018-06-30 MED ORDER — SODIUM CHLORIDE 0.9 % IV SOLN
2400.0000 mg/m2 | INTRAVENOUS | Status: DC
  Administered 2018-06-30: 5250 mg via INTRAVENOUS
  Filled 2018-06-30: qty 105

## 2018-06-30 MED ORDER — FLUOROURACIL CHEMO INJECTION 2.5 GM/50ML
400.0000 mg/m2 | Freq: Once | INTRAVENOUS | Status: AC
  Administered 2018-06-30: 900 mg via INTRAVENOUS
  Filled 2018-06-30: qty 18

## 2018-06-30 MED ORDER — PALONOSETRON HCL INJECTION 0.25 MG/5ML
0.2500 mg | Freq: Once | INTRAVENOUS | Status: AC
  Administered 2018-06-30: 0.25 mg via INTRAVENOUS
  Filled 2018-06-30: qty 5

## 2018-06-30 MED ORDER — DEXTROSE 5 % IV SOLN
Freq: Once | INTRAVENOUS | Status: AC
  Administered 2018-06-30: 10:00:00 via INTRAVENOUS

## 2018-06-30 MED ORDER — SODIUM CHLORIDE 0.9% FLUSH
10.0000 mL | INTRAVENOUS | Status: DC | PRN
  Administered 2018-06-30: 10 mL
  Filled 2018-06-30: qty 10

## 2018-06-30 MED ORDER — LEUCOVORIN CALCIUM INJECTION 350 MG
411.0000 mg/m2 | Freq: Once | INTRAVENOUS | Status: AC
  Administered 2018-06-30: 900 mg via INTRAVENOUS
  Filled 2018-06-30: qty 45

## 2018-06-30 MED ORDER — LORAZEPAM 2 MG/ML IJ SOLN
1.0000 mg | Freq: Once | INTRAMUSCULAR | Status: AC
  Administered 2018-06-30: 1 mg via INTRAVENOUS
  Filled 2018-06-30: qty 1

## 2018-06-30 MED ORDER — SODIUM CHLORIDE 0.9 % IV SOLN
Freq: Once | INTRAVENOUS | Status: AC
  Administered 2018-06-30: 11:00:00 via INTRAVENOUS
  Filled 2018-06-30: qty 5

## 2018-06-30 NOTE — Patient Instructions (Signed)
[facility] at [facility] Discharge Instructions  Labs drawn from portacath today   Thank you for choosing [facility] at [facility] to provide your oncology and hematology care.  To afford each patient quality time with our provider, please arrive at least 15 minutes before your scheduled appointment time.   If you have a lab appointment with the Cancer Center please come in thru the  Main Entrance and check in at the main information desk  You need to re-schedule your appointment should you arrive 10 or more minutes late.  We strive to give you quality time with our providers, and arriving late affects you and other patients whose appointments are after yours.  Also, if you no show three or more times for appointments you may be dismissed from the clinic at the providers discretion.     Again, thank you for choosing [facility].  Our hope is that these requests will decrease the amount of time that you wait before being seen by our physicians.       _____________________________________________________________  Should you have questions after your visit to  Cancer Center, please contact our office at [phone] between the hours of 8:00 a.m. and 4:30 p.m.  Voicemails left after 4:00 p.m. will not be returned until the following business day.  For prescription refill requests, have your pharmacy contact our office and allow 72 hours.    Cancer Center Support Programs:   > Cancer Support Group  2nd Tuesday of the month 1pm-2pm, Journey Room   

## 2018-06-30 NOTE — Patient Instructions (Addendum)
[place] at [facility] Discharge Instructions  You were seen today by Dr. Delton Coombes. He went over your recent lab results, and they were good. He will see you back in 2 weeks for labs, treatment and follow up.   Thank you for choosing [place] at [facility] to provide your oncology and hematology care.  To afford each patient quality time with our provider, please arrive at least 15 minutes before your scheduled appointment time.   If you have a lab appointment with the [place] please come in thru the  Main Entrance and check in at the main information desk  You need to re-schedule your appointment should you arrive 10 or more minutes late.  We strive to give you quality time with our providers, and arriving late affects you and other patients whose appointments are after yours.  Also, if you no show three or more times for appointments you may be dismissed from the clinic at the providers discretion.     Again, thank you for choosing [facility].  Our hope is that these requests will decrease the amount of time that you wait before being seen by our physicians.       _____________________________________________________________  Should you have questions after your visit to [facility], please contact our office at [phone] between the hours of 8:00 a.m. and 4:30 p.m.  Voicemails left after 4:00 p.m. will not be returned until the following business day.  For prescription refill requests, have your pharmacy contact our office and allow 72 hours.    Cancer Center Support Programs:   > Cancer Support Group  2nd Tuesday of the month 1pm-2pm, Journey Room

## 2018-06-30 NOTE — Assessment & Plan Note (Addendum)
1.  Stage III (pT3 pN2b) sigmoid colon adenocarcinoma, MSI-stable: - Patient had left lower quadrant abdominal pain since October, CT scan done at [facility] of the abdomen pelvis showed sigmoid colon thickening. -he underwent colonoscopy on 03/06/2018 showing a fungating infiltrative and polypoid partially obstructing large mass in the sigmoid colon between 20-25 cm from the anal verge.  This was consistent with colon cancer. - He underwent sigmoid colon segmental resection on 03/02/2018 by Dr. Arnoldo Morale.  This showed invasive adenocarcinoma, well-differentiated, 3.8 cm, tumor invades through muscularis propria, resection margins negative, 14/35 lymph nodes positive, satellite nodules x3, PT 3, PN2B, MMR preserved. - CT of the chest on 02/26/2018 shows 2 small pulmonary nodules, 7 x 5 mm subpleural nodule in the right major fissure and the 4 mm left lower lobe nodule.  Mild hepatic steatosis was seen. - PET/CT scan on 03/30/2018 shows bilateral hypermetabolic pelvic sidewall lymph nodes, subcentimeter, not in the initial distribution pattern for sigmoid primary.  Lymph nodes are in the bilateral external iliac and right inguinal lymph node (7 mm).  They are most likely reactive.  A pelvic CT in 3 months is recommended.  Given normal CEA level, they are more likely reactive. -5 cycles of adjuvant FOLFOX from 04/21/2018 through 06/17/2018. -He continues to have cold sensitivity lasting 4 to 5 days after each cycle.  He tolerated last cycle very well.  He did not experience any fevers or cough. -Denies any tingling or numbness in the extremities.  Denies any ER visits. - I have reviewed his blood work today.  Mild leukopenia and thrombocytopenia noted.  He may proceed with his next cycle without any dose modifications. -I will see him back in 2 weeks for follow-up.  2.  Family history: -2 maternal aunts had breast cancer.  No family history of colorectal cancer. -I have recommended genetic testing given his  young age at diagnosis.

## 2018-06-30 NOTE — Progress Notes (Signed)
Reviewed patients labs and vital signs with Dr. Delton Coombes after oncology follow up and ok to treat verbal order Dr. Delton Coombes.   Patient tolerated chemotherapy with no complaints voiced.  Port site clean and dry with no bruising or swelling noted at site.  Good blood return noted before and after administration of chemotherapy.  Chemotherapy pump connected with no alarms noted.   Patient left ambulatory with VSS and no s/s of distress noted.

## 2018-06-30 NOTE — Progress Notes (Signed)
Joel Jefferson, [place] [address]   CLINIC:  Medical Oncology/Hematology  PCP:  Joel Jefferson, [place] [address] [phone]   REASON FOR VISIT:  Follow-up for stage III colon cancer  CURRENT THERAPY:Folfox every 2 weeks   BRIEF ONCOLOGIC HISTORY:    Malignant neoplasm of sigmoid colon [facility])    Initial Diagnosis    Cancer of sigmoid colon ([place])    03/27/2018 Genetic Testing    ALK c.350C>G VUS identified on the multicancer panel.  The Multi-Gene Panel offered by [facility] includes sequencing and/or deletion duplication testing of the following 85 genes: AIP, ALK, APC, ATM, AXIN2,BAP1,  BARD1, BLM, BMPR1A, BRCA1, BRCA2, BRIP1, CASR, CDC73, CDH1, CDK4, CDKN1B, CDKN1C, CDKN2A (p14ARF), CDKN2A (p16INK4a), CEBPA, CHEK2, CTNNA1, DICER1, DIS3L2, EGFR (c.2369C>T, p.Thr790Met variant only), EPCAM (Deletion/duplication testing only), FH, FLCN, GATA2, GPC3, GREM1 (Promoter region deletion/duplication testing only), HOXB13 (c.251G>A, p.Gly84Glu), HRAS, KIT, MAX, MEN1, MET, MITF (c.952G>A, p.Glu318Lys variant only), MLH1, MSH2, MSH3, MSH6, MUTYH, NBN, NF1, NF2, NTHL1, PALB2, PDGFRA, PHOX2B, PMS2, POLD1, POLE, POT1, PRKAR1A, PTCH1, PTEN, RAD50, RAD51C, RAD51D, RB1, RECQL4, RET, RNF43, RUNX1, SDHAF2, SDHA (sequence changes only), SDHB, SDHC, SDHD, SMAD4, SMARCA4, SMARCB1, SMARCE1, STK11, SUFU, TERC, TERT, TMEM127, TP53, TSC1, TSC2, VHL, WRN and WT1.   The report date is 03/27/2018.    04/21/2018 -  Chemotherapy    The patient had palonosetron (ALOXI) injection 0.25 mg, 0.25 mg, Intravenous,  Once, 7 of 12 cycles Administration: 0.25 mg (04/21/2018), 0.25 mg (05/05/2018), 0.25 mg (05/19/2018), 0.25 mg (06/02/2018), 0.25 mg (06/17/2018) leucovorin 800 mg in dextrose 5 % 250 mL infusion, 876 mg, Intravenous,  Once, 7 of 12 cycles Administration: 800 mg (04/21/2018), 900 mg (05/05/2018), 800 mg (05/19/2018), 800 mg (06/02/2018), 900 mg  (06/17/2018) oxaliplatin (ELOXATIN) 185 mg in dextrose 5 % 500 mL chemo infusion, 85 mg/m2 = 185 mg, Intravenous,  Once, 7 of 12 cycles Administration: 185 mg (04/21/2018), 185 mg (05/05/2018), 185 mg (05/19/2018), 185 mg (06/02/2018), 185 mg (06/17/2018) fluorouracil (ADRUCIL) chemo injection 900 mg, 400 mg/m2 = 900 mg, Intravenous,  Once, 7 of 12 cycles Administration: 900 mg (04/21/2018), 900 mg (05/05/2018), 900 mg (05/19/2018), 900 mg (06/02/2018), 900 mg (06/17/2018) fosaprepitant (EMEND) 150 mg, dexamethasone (DECADRON) 12 mg in sodium chloride 0.9 % 145 mL IVPB, , Intravenous,  Once, 4 of 9 cycles Administration:  (06/02/2018),  (06/17/2018) fluorouracil (ADRUCIL) 5,250 mg in sodium chloride 0.9 % 145 mL chemo infusion, 2,400 mg/m2 = 5,250 mg, Intravenous, 1 Day/Dose, 7 of 12 cycles Administration: 5,250 mg (04/21/2018), 5,250 mg (05/05/2018), 5,250 mg (05/19/2018), 5,250 mg (06/02/2018), 5,250 mg (06/17/2018)  for chemotherapy treatment.       CANCER STAGING: Cancer Staging No matching staging information was found for the patient.   INTERVAL HISTORY:  Joel Jefferson 43 y.o. male returns for routine follow-up and consideration for next cycle of chemotherapy. He is here today alone. He state that he did well after this last treatment. He state that he still experienced some constipation but not as bad as last time. He states that he had cold sensitivity for about 3-4 days after his last treatment.  Denies any vomiting. Denies any new pains. Had not noticed any recent bleeding such as epistaxis, hematuria or hematochezia. Denies recent chest pain on exertion, shortness of breath on minimal exertion, pre-syncopal episodes, or palpitations. Denies any numbness or tingling in hands or feet. Denies any recent fevers, infections, or recent hospitalizations. Patient reports appetite at 100% and energy  level at 100%.     REVIEW OF SYSTEMS:  Review of Systems  All other systems reviewed and are  negative.    PAST MEDICAL/SURGICAL HISTORY:  Past Medical History:  Diagnosis Date  . Family history of breast cancer   . GERD (gastroesophageal reflux disease)   . History of kidney stones   . Hypercholesterolemia   . Hypertension   . Urethral stricture    Past Surgical History:  Procedure Laterality Date  . BALLOON DILATION  04/06/2012   Procedure: BALLOON DILATION;  Surgeon: Joel Packer, MD;  Location[facility];  Service: Urology;  Laterality: N/A;  . BIOPSY  02/24/2018   Procedure: BIOPSY;  Surgeon: Joel Binder, MD;  [facility];  Service: Endoscopy;;  colon  . COLONOSCOPY WITH PROPOFOL N/A 02/24/2018   Procedure: COLONOSCOPY WITH PROPOFOL;  Surgeon: Joel Binder, MD;  Location: [facility];  Service: Endoscopy;  Laterality: N/A;  9:30am  . ESOPHAGOGASTRODUODENOSCOPY (EGD) WITH PROPOFOL N/A 08/29/2015   Dr. Oneida Jefferson: possible proximal esophageal web s/p dilation, moderate gastritis, negative eosinophilic esophagitis   . PARTIAL COLECTOMY N/A 03/02/2018   Procedure: PARTIAL COLECTOMY;  Surgeon: Joel Signs, MD;  Location: [facility];  Service: General;  Laterality: N/A;  . PORTACATH PLACEMENT Left 04/13/2018   Procedure: INSERTION PORT-A-CATH;  Surgeon: Joel Signs, MD;  Location: [facility];  Service: General;  Laterality: Left;  . SAVORY DILATION N/A 08/29/2015   Procedure: SAVORY DILATION;  Surgeon: Joel Binder, MD;  [facility];  Service: Endoscopy;  Laterality: N/A;  . URETHRAL DILATION    . WISDOM TOOTH EXTRACTION       SOCIAL HISTORY:  Social History   Socioeconomic History  . Marital status: Divorced    Spouse name: Not on file  . Number of children: Not on file  . Years of education: Not on file  . Highest education level: Not on file  Occupational History  . Not on file  Social Needs  . Financial resource strain: Not on file  . Food insecurity:    Worry: Not on file    Inability: Not on file  .  Transportation needs:    Medical: Not on file    Non-medical: Not on file  Tobacco Use  . Smoking status: Former Smoker    Packs/day: 0.25    Years: 9.00    Pack years: 2.25    Types: Cigarettes    Last attempt to quit: 07/27/2011    Years since quitting: 6.9  . Smokeless tobacco: Never Used  Substance and Sexual Activity  . Alcohol use: Yes    Alcohol/week: 6.0 standard drinks    Types: 6 Cans of beer per week  . Drug use: No  . Sexual activity: Yes    Birth control/protection: None  Lifestyle  . Physical activity:    Days per week: Not on file    Minutes per session: Not on file  . Stress: Not on file  Relationships  . Social connections:    Talks on phone: Not on file    Gets together: Not on file    Attends religious service: Not on file    Active member of club or organization: Not on file    Attends meetings of clubs or organizations: Not on file    Relationship status: Not on file  . Intimate partner violence:    Fear of current or ex partner: Not on file    Emotionally abused: Not  on file    Physically abused: Not on file    Forced sexual activity: Not on file  Other Topics Concern  . Not on file  Social History Narrative  . Not on file    FAMILY HISTORY:  Family History  Problem Relation Age of Onset  . Dementia Mother   . Dementia Maternal Aunt   . Dementia Maternal Grandmother   . COPD Maternal Grandfather   . Breast cancer Maternal Aunt 42  . Breast cancer Maternal Aunt        dx in her 49s  . Lung cancer Maternal Aunt   . Healthy Son   . Colon cancer Neg Hx     CURRENT MEDICATIONS:  Outpatient Encounter Medications as of 06/30/2018  Medication Sig  . Ascorbic Acid (VITAMIN C) 1000 MG tablet Take 1,000 mg by mouth daily.  Marland Kitchen esomeprazole (NEXIUM) 40 MG capsule Take 40 mg by mouth daily.   . fluorouracil CALGB 57017 in sodium chloride 0.9 % 150 mL Inject 6,150 mg into the vein every 14 (fourteen) days. Over 46 hours  . LEUCOVORIN CALCIUM IV  Inject 876 mg into the vein every 14 (fourteen) days.  Marland Kitchen lidocaine (XYLOCAINE) 2 % solution Use as directed 15 mLs in the mouth or throat 2 (two) times daily.  Marland Kitchen lidocaine-prilocaine (EMLA) cream Apply small amount to port site one hour prior to appointment. Cover with plastic wrap  . lisinopril (PRINIVIL,ZESTRIL) 10 MG tablet Take 10 mg by mouth daily.   . Magnesium 500 MG CAPS Take 500 mg by mouth daily.   . Multiple Vitamin (MULTIVITAMIN) tablet Take 1 tablet by mouth daily.  . ondansetron (ZOFRAN) 8 MG tablet Take 1 tablet (8 mg total) by mouth every 8 (eight) hours as needed for nausea or vomiting (may start taking as needed two days after chemotherapy).  . OXALIPLATIN IV Inject 185 mg into the vein every 14 (fourteen) days.  . Potassium (POTASSIMIN PO) Take 1 tablet by mouth daily.   . pravastatin (PRAVACHOL) 20 MG tablet Take 20 mg by mouth daily.   . promethazine (PHENERGAN) 25 MG tablet Take 1 tablet (25 mg total) by mouth every 6 (six) hours as needed for nausea or vomiting.  . topiramate (TOPAMAX) 100 MG tablet Take 100 mg by mouth 2 (two) times daily.    No facility-administered encounter medications on file as of 06/30/2018.     ALLERGIES:  No Known Allergies   PHYSICAL EXAM:  ECOG Performance status: 1  I have reviewed his vitals.  Blood pressure is 134/95.  Temperature is 98.6.  Pulse rate is 100.  Respirate is 18.  Saturations are 97. Physical Exam Constitutional:      Appearance: Normal appearance.  Cardiovascular:     Rate and Rhythm: Normal rate and regular rhythm.     Heart sounds: Normal heart sounds.  Pulmonary:     Effort: Pulmonary effort is normal.     Breath sounds: Normal breath sounds.  Abdominal:     Palpations: Abdomen is soft. There is no mass.  Musculoskeletal:        General: No swelling.  Skin:    General: Skin is warm.  Neurological:     General: No focal deficit present.     Mental Status: He is alert and oriented to person, place, and time.   Psychiatric:        Mood and Affect: Mood normal.        Behavior: Behavior normal.  LABORATORY DATA:  I have reviewed the labs as listed.  CBC    Component Value Date/Time   WBC 3.7 (L) 06/30/2018 0857   RBC 4.58 06/30/2018 0857   HGB 13.5 06/30/2018 0857   HCT 39.6 06/30/2018 0857   PLT 124 (L) 06/30/2018 0857   MCV 86.5 06/30/2018 0857   MCH 29.5 06/30/2018 0857   MCHC 34.1 06/30/2018 0857   RDW 15.9 (H) 06/30/2018 0857   LYMPHSABS 1.3 06/30/2018 0857   MONOABS 0.3 06/30/2018 0857   EOSABS 0.1 06/30/2018 0857   BASOSABS 0.0 06/30/2018 0857   CMP Latest Ref Rng & Units 06/30/2018 06/16/2018 06/02/2018  Glucose 70 - 99 mg/dL 315(H) 226(H) 291(H)  BUN 6 - 20 mg/dL '14 19 13  '$ Creatinine 0.61 - 1.24 mg/dL 0.86 1.19 0.89  Sodium 135 - 145 mmol/L 138 136 137  Potassium 3.5 - 5.1 mmol/L 3.8 3.9 4.4  Chloride 98 - 111 mmol/L 109 109 108  CO2 22 - 32 mmol/L 22 20(L) 22  Calcium 8.9 - 10.3 mg/dL 8.8(L) 8.7(L) 8.6(L)  Total Protein 6.5 - 8.1 g/dL 6.4(L) 6.6 6.4(L)  Total Bilirubin 0.3 - 1.2 mg/dL 0.5 0.5 0.4  Alkaline Phos 38 - 126 U/L 63 52 53  AST 15 - 41 U/L 34 28 28  ALT 0 - 44 U/L 57(H) 43 46(H)       DIAGNOSTIC IMAGING:  I have independently reviewed the scans and discussed with the patient.   I have reviewed Venita Lick LPN's note and agree with the documentation.  I personally performed a face-to-face visit, made revisions and my assessment and plan is as follows.    ASSESSMENT & PLAN:   Malignant neoplasm of sigmoid colon (HCC) 1.  Stage III (pT3 pN2b) sigmoid colon adenocarcinoma, MSI-stable: - Patient had left lower quadrant abdominal pain since October, CT scan done at [facility] of the abdomen pelvis showed sigmoid colon thickening. -he underwent colonoscopy on 03/06/2018 showing a fungating infiltrative and polypoid partially obstructing large mass in the sigmoid colon between 20-25 cm from the anal verge.  This was consistent with colon cancer. - He  underwent sigmoid colon segmental resection on 03/02/2018 by Dr. Arnoldo Morale.  This showed invasive adenocarcinoma, well-differentiated, 3.8 cm, tumor invades through muscularis propria, resection margins negative, 14/35 lymph nodes positive, satellite nodules x3, PT 3, PN2B, MMR preserved. - CT of the chest on 02/26/2018 shows 2 small pulmonary nodules, 7 x 5 mm subpleural nodule in the right major fissure and the 4 mm left lower lobe nodule.  Mild hepatic steatosis was seen. - PET/CT scan on 03/30/2018 shows bilateral hypermetabolic pelvic sidewall lymph nodes, subcentimeter, not in the initial distribution pattern for sigmoid primary.  Lymph nodes are in the bilateral external iliac and right inguinal lymph node (7 mm).  They are most likely reactive.  A pelvic CT in 3 months is recommended.  Given normal CEA level, they are more likely reactive. -5 cycles of adjuvant FOLFOX from 04/21/2018 through 06/17/2018. -He continues to have cold sensitivity lasting 4 to 5 days after each cycle.  He tolerated last cycle very well.  He did not experience any fevers or cough. -Denies any tingling or numbness in the extremities.  Denies any ER visits. - I have reviewed his blood work today.  Mild leukopenia and thrombocytopenia noted.  He may proceed with his next cycle without any dose modifications. -I will see him back in 2 weeks for follow-up.  2.  Family history: -2 maternal aunts had breast  cancer.  No family history of colorectal cancer. -I have recommended genetic testing given his young age at diagnosis.       Orders placed this encounter:  No orders of the defined types were placed in this encounter.     Derek Jack, MD [place] [phone]

## 2018-06-30 NOTE — Patient Instructions (Signed)
[facility] Discharge Instructions for Patients Receiving Chemotherapy  Today you received the following chemotherapy agents  If you develop nausea and vomiting that is not controlled by your nausea medication, call the clinic.   BELOW ARE SYMPTOMS THAT SHOULD BE REPORTED IMMEDIATELY:  *FEVER GREATER THAN 100.5 F  *CHILLS WITH OR WITHOUT FEVER  NAUSEA AND VOMITING THAT IS NOT CONTROLLED WITH YOUR NAUSEA MEDICATION  *UNUSUAL SHORTNESS OF BREATH  *UNUSUAL BRUISING OR BLEEDING  TENDERNESS IN MOUTH AND THROAT WITH OR WITHOUT PRESENCE OF ULCERS  *URINARY PROBLEMS  *BOWEL PROBLEMS  UNUSUAL RASH Items with * indicate a potential emergency and should be followed up as soon as possible.  Feel free to call the clinic should you have any questions or concerns. The clinic phone number is [phone].  Please show the CHEMO ALERT CARD at check-in to the Emergency Department and triage nurse.   

## 2018-07-01 LAB — CEA: CEA: 4.2 ng/mL (ref 0.0–4.7)

## 2018-07-02 ENCOUNTER — Inpatient Hospital Stay (HOSPITAL_COMMUNITY): Payer: BLUE CROSS/BLUE SHIELD

## 2018-07-02 ENCOUNTER — Other Ambulatory Visit: Payer: Self-pay

## 2018-07-02 ENCOUNTER — Encounter (HOSPITAL_COMMUNITY): Payer: Self-pay

## 2018-07-02 VITALS — BP 116/72 | HR 84 | Temp 98.4°F | Resp 18

## 2018-07-02 DIAGNOSIS — C187 Malignant neoplasm of sigmoid colon: Secondary | ICD-10-CM | POA: Diagnosis not present

## 2018-07-02 DIAGNOSIS — Z5111 Encounter for antineoplastic chemotherapy: Secondary | ICD-10-CM | POA: Diagnosis not present

## 2018-07-02 DIAGNOSIS — Z452 Encounter for adjustment and management of vascular access device: Secondary | ICD-10-CM | POA: Diagnosis not present

## 2018-07-02 MED ORDER — HEPARIN SOD (PORK) LOCK FLUSH 100 UNIT/ML IV SOLN
500.0000 [IU] | Freq: Once | INTRAVENOUS | Status: AC | PRN
  Administered 2018-07-02: 500 [IU]

## 2018-07-02 MED ORDER — SODIUM CHLORIDE 0.9% FLUSH
10.0000 mL | INTRAVENOUS | Status: DC | PRN
  Administered 2018-07-02: 10 mL
  Filled 2018-07-02: qty 10

## 2018-07-02 NOTE — Patient Instructions (Signed)
[facility] at [facility] Discharge Instructions  5FU pump discontinued today with portacath flushed per protocol. Follow-up as scheduled. Call clinic for any questions or concerns   Thank you for choosing [facility] at [facility] to provide your oncology and hematology care.  To afford each patient quality time with our provider, please arrive at least 15 minutes before your scheduled appointment time.   If you have a lab appointment with the Cancer Center please come in thru the  Main Entrance and check in at the main information desk  You need to re-schedule your appointment should you arrive 10 or more minutes late.  We strive to give you quality time with our providers, and arriving late affects you and other patients whose appointments are after yours.  Also, if you no show three or more times for appointments you may be dismissed from the clinic at the providers discretion.     Again, thank you for choosing [facility].  Our hope is that these requests will decrease the amount of time that you wait before being seen by our physicians.       _____________________________________________________________  Should you have questions after your visit to  Cancer Center, please contact our office at [phone] between the hours of 8:00 a.m. and 4:30 p.m.  Voicemails left after 4:00 p.m. will not be returned until the following business day.  For prescription refill requests, have your pharmacy contact our office and allow 72 hours.    Cancer Center Support Programs:   > Cancer Support Group  2nd Tuesday of the month 1pm-2pm, Journey Room   

## 2018-07-02 NOTE — Progress Notes (Signed)
Joel Jefferson. tolerated 5FU pump well without complaints or incident. 5FU pump discontinued with portacath flushed with 10 ml NS and 5 ml Heparin easily per protocol then de-accessed. VSS Pt discharged self ambulatory in satisfactory condition

## 2018-07-13 ENCOUNTER — Inpatient Hospital Stay (HOSPITAL_COMMUNITY): Payer: BLUE CROSS/BLUE SHIELD

## 2018-07-13 ENCOUNTER — Inpatient Hospital Stay (HOSPITAL_BASED_OUTPATIENT_CLINIC_OR_DEPARTMENT_OTHER): Payer: BLUE CROSS/BLUE SHIELD | Admitting: Hematology

## 2018-07-13 ENCOUNTER — Other Ambulatory Visit: Payer: Self-pay

## 2018-07-13 ENCOUNTER — Inpatient Hospital Stay (HOSPITAL_COMMUNITY): Payer: BLUE CROSS/BLUE SHIELD | Attending: Hematology

## 2018-07-13 ENCOUNTER — Encounter (HOSPITAL_COMMUNITY): Payer: Self-pay | Admitting: Hematology

## 2018-07-13 VITALS — BP 137/93 | HR 80 | Temp 98.3°F | Resp 18

## 2018-07-13 DIAGNOSIS — C187 Malignant neoplasm of sigmoid colon: Secondary | ICD-10-CM

## 2018-07-13 DIAGNOSIS — Z5111 Encounter for antineoplastic chemotherapy: Secondary | ICD-10-CM | POA: Diagnosis not present

## 2018-07-13 DIAGNOSIS — Z452 Encounter for adjustment and management of vascular access device: Secondary | ICD-10-CM | POA: Diagnosis not present

## 2018-07-13 LAB — COMPREHENSIVE METABOLIC PANEL
ALT: 75 U/L — ABNORMAL HIGH (ref 0–44)
AST: 37 U/L (ref 15–41)
Albumin: 3.8 g/dL (ref 3.5–5.0)
Alkaline Phosphatase: 81 U/L (ref 38–126)
Anion gap: 9 (ref 5–15)
BUN: 16 mg/dL (ref 6–20)
CO2: 21 mmol/L — ABNORMAL LOW (ref 22–32)
Calcium: 8.7 mg/dL — ABNORMAL LOW (ref 8.9–10.3)
Chloride: 106 mmol/L (ref 98–111)
Creatinine, Ser: 0.98 mg/dL (ref 0.61–1.24)
GFR calc Af Amer: >60 mL/min (ref >60)
GFR calc non Af Amer: >60 mL/min (ref >60)
Glucose, Bld: 304 mg/dL — ABNORMAL HIGH (ref 70–99)
Potassium: 3.9 mmol/L (ref 3.5–5.1)
Sodium: 136 mmol/L (ref 135–145)
Total Bilirubin: 0.6 mg/dL (ref 0.3–1.2)
Total Protein: 6.3 g/dL — ABNORMAL LOW (ref 6.5–8.1)

## 2018-07-13 LAB — CBC WITH DIFFERENTIAL/PLATELET
Abs Immature Granulocytes: 0.01 10*3/uL (ref 0.00–0.07)
Basophils Absolute: 0 10*3/uL (ref 0.0–0.1)
Basophils Relative: 1 %
Eosinophils Absolute: 0.1 10*3/uL (ref 0.0–0.5)
Eosinophils Relative: 2 %
HCT: 36.8 % — ABNORMAL LOW (ref 39.0–52.0)
Hemoglobin: 12.8 g/dL — ABNORMAL LOW (ref 13.0–17.0)
Immature Granulocytes: 0 %
Lymphocytes Relative: 35 %
Lymphs Abs: 1.2 10*3/uL (ref 0.7–4.0)
MCH: 30.8 pg (ref 26.0–34.0)
MCHC: 34.8 g/dL (ref 30.0–36.0)
MCV: 88.7 fL (ref 80.0–100.0)
Monocytes Absolute: 0.3 10*3/uL (ref 0.1–1.0)
Monocytes Relative: 8 %
Neutro Abs: 1.9 10*3/uL (ref 1.7–7.7)
Neutrophils Relative %: 54 %
Platelets: 117 10*3/uL — ABNORMAL LOW (ref 150–400)
RBC: 4.15 MIL/uL — ABNORMAL LOW (ref 4.22–5.81)
RDW: 15.9 % — ABNORMAL HIGH (ref 11.5–15.5)
WBC: 3.5 10*3/uL — ABNORMAL LOW (ref 4.0–10.5)
nRBC: 0 % (ref 0.0–0.2)

## 2018-07-13 MED ORDER — FLUOROURACIL CHEMO INJECTION 2.5 GM/50ML
400.0000 mg/m2 | Freq: Once | INTRAVENOUS | Status: AC
  Administered 2018-07-13: 900 mg via INTRAVENOUS
  Filled 2018-07-13: qty 18

## 2018-07-13 MED ORDER — SODIUM CHLORIDE 0.9 % IV SOLN
Freq: Once | INTRAVENOUS | Status: AC
  Administered 2018-07-13: 10:00:00 via INTRAVENOUS
  Filled 2018-07-13: qty 5

## 2018-07-13 MED ORDER — LEUCOVORIN CALCIUM 500 MG/50ML IJ SOLN
900.0000 mg | Freq: Once | INTRAVENOUS | Status: AC
  Administered 2018-07-13: 900 mg via INTRAVENOUS
  Filled 2018-07-13: qty 250

## 2018-07-13 MED ORDER — LORAZEPAM 2 MG/ML IJ SOLN
1.0000 mg | Freq: Once | INTRAMUSCULAR | Status: AC
  Administered 2018-07-13: 1 mg via INTRAVENOUS
  Filled 2018-07-13: qty 1

## 2018-07-13 MED ORDER — OXALIPLATIN CHEMO INJECTION 100 MG/20ML
85.0000 mg/m2 | Freq: Once | INTRAVENOUS | Status: AC
  Administered 2018-07-13: 185 mg via INTRAVENOUS
  Filled 2018-07-13: qty 37

## 2018-07-13 MED ORDER — SODIUM CHLORIDE 0.9% FLUSH
10.0000 mL | INTRAVENOUS | Status: DC | PRN
  Administered 2018-07-13: 10 mL
  Filled 2018-07-13: qty 10

## 2018-07-13 MED ORDER — SODIUM CHLORIDE 0.9 % IV SOLN
2400.0000 mg/m2 | INTRAVENOUS | Status: DC
  Administered 2018-07-13: 5250 mg via INTRAVENOUS
  Filled 2018-07-13: qty 105

## 2018-07-13 MED ORDER — DEXTROSE 5 % IV SOLN
Freq: Once | INTRAVENOUS | Status: AC
  Administered 2018-07-13: 11:00:00 via INTRAVENOUS

## 2018-07-13 MED ORDER — PALONOSETRON HCL INJECTION 0.25 MG/5ML
0.2500 mg | Freq: Once | INTRAVENOUS | Status: AC
  Administered 2018-07-13: 0.25 mg via INTRAVENOUS
  Filled 2018-07-13: qty 5

## 2018-07-13 MED ORDER — DEXTROSE 5 % IV SOLN
Freq: Once | INTRAVENOUS | Status: AC
  Administered 2018-07-13: 10:00:00 via INTRAVENOUS

## 2018-07-13 NOTE — Patient Instructions (Signed)
[facility] at [facility] Discharge Instructions  You were seen today by Dr. Katragadda. He went over your recent lab results. He will see you back in 2 weeks for labs, treatment and follow up.   Thank you for choosing [facility] at [facility] to provide your oncology and hematology care.  To afford each patient quality time with our provider, please arrive at least 15 minutes before your scheduled appointment time.   If you have a lab appointment with the Cancer Center please come in thru the  Main Entrance and check in at the main information desk  You need to re-schedule your appointment should you arrive 10 or more minutes late.  We strive to give you quality time with our providers, and arriving late affects you and other patients whose appointments are after yours.  Also, if you no show three or more times for appointments you may be dismissed from the clinic at the providers discretion.     Again, thank you for choosing [facility].  Our hope is that these requests will decrease the amount of time that you wait before being seen by our physicians.       _____________________________________________________________  Should you have questions after your visit to [facility], please contact our office at [phone] between the hours of 8:00 a.m. and 4:30 p.m.  Voicemails left after 4:00 p.m. will not be returned until the following business day.  For prescription refill requests, have your pharmacy contact our office and allow 72 hours.    Cancer Center Support Programs:   > Cancer Support Group  2nd Tuesday of the month 1pm-2pm, Journey Room    

## 2018-07-13 NOTE — Assessment & Plan Note (Addendum)
1.  Stage III (pT3 pN2b) sigmoid colon adenocarcinoma, MSI-stable: - Patient had left lower quadrant abdominal pain since October, CT scan done at [facility] of the abdomen pelvis showed sigmoid colon thickening. -Colonoscopy on 03/06/2018 showing a fungating infiltrative and polypoid partially obstructing large mass in the sigmoid colon between 20 to 25 cm from the anal verge.  This was consistent with colon cancer.  - Sigmoid colon segmental resection 03/02/2018 by Dr. Arnoldo Morale. This showed invasive adenocarcinoma, well-differentiated, 3.8 cm, tumor invades through muscularis propria, resection margins negative, 14/35 lymph nodes positive, satellite nodules x3, PT 3, PN2B, MMR preserved. - CT of the chest on 02/26/2018 shows 2 small pulmonary nodules, 7 x 5 mm subpleural nodule in the right major fissure and the 4 mm left lower lobe nodule.  Mild hepatic steatosis was seen. - PET/CT scan on 03/30/2018 shows bilateral hypermetabolic pelvic sidewall lymph nodes, subcentimeter, not in the initial distribution pattern for sigmoid primary.  Lymph nodes are in the bilateral external iliac and right inguinal lymph node (7 mm).  They are most likely reactive.  A pelvic CT in 3 months is recommended.  Given normal CEA level, they are more likely reactive. -6 cycles of adjuvant FOLFOX from 04/21/2018 through 06/30/2018.  - Continues to have some cold sensitivity but denies any neuropathy.  He did experience some nausea. -We will proceed with cycle 7 without any dose modifications.  Mild leukopenia and thrombocytopenia has been stable. -We will reevaluate him in 2 weeks for his treatment.  2.  Family history: -2 maternal aunts had breast cancer.  No family history of colorectal cancer. -I have recommended genetic testing given his young age at diagnosis.

## 2018-07-13 NOTE — Patient Instructions (Signed)
[facility] at [facility] Discharge Instructions  Labs drawn from portacath today   Thank you for choosing  [facility] Center at [facility] to provide your oncology and hematology care.  To afford each patient quality time with our provider, please arrive at least 15 minutes before your scheduled appointment time.   If you have a lab appointment with the Cancer Center please come in thru the  Main Entrance and check in at the main information desk  You need to re-schedule your appointment should you arrive 10 or more minutes late.  We strive to give you quality time with our providers, and arriving late affects you and other patients whose appointments are after yours.  Also, if you no show three or more times for appointments you may be dismissed from the clinic at the providers discretion.     Again, thank you for choosing [facility].  Our hope is that these requests will decrease the amount of time that you wait before being seen by our physicians.       _____________________________________________________________  Should you have questions after your visit to [facility], please contact our office at [phone] between the hours of 8:00 a.m. and 4:30 p.m.  Voicemails left after 4:00 p.m. will not be returned until the following business day.  For prescription refill requests, have your pharmacy contact our office and allow 72 hours.    Cancer Center Support Programs:   > Cancer Support Group  2nd Tuesday of the month 1pm-2pm, Journey Room   

## 2018-07-13 NOTE — Patient Instructions (Signed)
[facility] Discharge Instructions for Patients Receiving Chemotherapy   Beginning January 23rd 2017 lab work for the Cancer Center will be done in the  Main lab at [place] on 1st floor. If you have a lab appointment with the Cancer Center please come in thru the  Main Entrance and check in at the main information desk   Today you received the following chemotherapy agents Oxaliplatin,Leucovorin and 5FU. Follow-up as scheduled. Call clinic for any questions or concerns  To help prevent nausea and vomiting after your treatment, we encourage you to take your nausea medication   If you develop nausea and vomiting, or diarrhea that is not controlled by your medication, call the clinic.  The clinic phone number is [phone]. Office hours are Monday-Friday 8:30am-5:00pm.  BELOW ARE SYMPTOMS THAT SHOULD BE REPORTED IMMEDIATELY:  *FEVER GREATER THAN 101.0 F  *CHILLS WITH OR WITHOUT FEVER  NAUSEA AND VOMITING THAT IS NOT CONTROLLED WITH YOUR NAUSEA MEDICATION  *UNUSUAL SHORTNESS OF BREATH  *UNUSUAL BRUISING OR BLEEDING  TENDERNESS IN MOUTH AND THROAT WITH OR WITHOUT PRESENCE OF ULCERS  *URINARY PROBLEMS  *BOWEL PROBLEMS  UNUSUAL RASH Items with * indicate a potential emergency and should be followed up as soon as possible. If you have an emergency after office hours please contact your primary care physician or go to the nearest emergency department.  Please call the clinic during office hours if you have any questions or concerns.   You may also contact the Patient Navigator at [phone] should you have any questions or need assistance in obtaining follow up care.      Resources For Cancer Patients and their Caregivers ? American Cancer Society: Can assist with transportation, wigs, general needs, runs Look Good Feel Better.        1-888-227-6333 ? Cancer Care: Provides financial assistance, online support groups, medication/co-pay assistance.   1-800-813-HOPE (4673) ? [facility] Assists [place] cancer patients and their families through emotional , educational and financial support.  336-427-4357 ? [place] DSS Where to apply for food stamps, Medicaid and utility assistance. 336-342-1394 ? [facility]: Transportation to medical appointments. 336-347-2287 ? Social Security Administration: May apply for disability if have a Stage IV cancer. 336-342-7796 1-800-772-1213 ? [place] Aging, Disability and Transit Services: Assists with nutrition, care and transit needs. 336-349-2343         

## 2018-07-13 NOTE — Progress Notes (Signed)
0940 CBCD and CMET results reviewed with and pt seen by Dr. Delton Coombes and pt approved for chemo tx today per MD                                                                     Joel Jefferson. tolerated chemo tx well without complaints or incident. Pt discharged with 5FU pump infusing without issues. VSS upon discharge. Pt discharged self ambulatory in satisfactory condition

## 2018-07-13 NOTE — Progress Notes (Signed)
[place], [place] [address]   CLINIC:  Medical Oncology/Hematology  PCP:  Sharilyn Sites, [address] [place] [address] [phone]   REASON FOR VISIT:  Follow-up for stage III colon cancer  CURRENT THERAPY:Folfox every 2 weeks   BRIEF ONCOLOGIC HISTORY:    Malignant neoplasm of sigmoid colon [facility])    Initial Diagnosis    Cancer of sigmoid colon ([place])    03/27/2018 Genetic Testing    ALK c.350C>G VUS identified on the multicancer panel.  The Multi-Gene Panel offered by [facility] includes sequencing and/or deletion duplication testing of the following 85 genes: AIP, ALK, APC, ATM, AXIN2,BAP1,  BARD1, BLM, BMPR1A, BRCA1, BRCA2, BRIP1, CASR, CDC73, CDH1, CDK4, CDKN1B, CDKN1C, CDKN2A (p14ARF), CDKN2A (p16INK4a), CEBPA, CHEK2, CTNNA1, DICER1, DIS3L2, EGFR (c.2369C>T, p.Thr790Met variant only), EPCAM (Deletion/duplication testing only), FH, FLCN, GATA2, GPC3, GREM1 (Promoter region deletion/duplication testing only), HOXB13 (c.251G>A, p.Gly84Glu), HRAS, KIT, MAX, MEN1, MET, MITF (c.952G>A, p.Glu318Lys variant only), MLH1, MSH2, MSH3, MSH6, MUTYH, NBN, NF1, NF2, NTHL1, PALB2, PDGFRA, PHOX2B, PMS2, POLD1, POLE, POT1, PRKAR1A, PTCH1, PTEN, RAD50, RAD51C, RAD51D, RB1, RECQL4, RET, RNF43, RUNX1, SDHAF2, SDHA (sequence changes only), SDHB, SDHC, SDHD, SMAD4, SMARCA4, SMARCB1, SMARCE1, STK11, SUFU, TERC, TERT, TMEM127, TP53, TSC1, TSC2, VHL, WRN and WT1.   The report date is 03/27/2018.    04/21/2018 -  Chemotherapy    The patient had palonosetron (ALOXI) injection 0.25 mg, 0.25 mg, Intravenous,  Once, 8 of 12 cycles Administration: 0.25 mg (04/21/2018), 0.25 mg (05/05/2018), 0.25 mg (05/19/2018), 0.25 mg (06/02/2018), 0.25 mg (06/17/2018), 0.25 mg (06/30/2018), 0.25 mg (07/13/2018) leucovorin 800 mg in dextrose 5 % 250 mL infusion, 876 mg, Intravenous,  Once, 8 of 12 cycles Administration: 800 mg (04/21/2018), 900 mg (05/05/2018), 800 mg (05/19/2018), 800  mg (06/02/2018), 900 mg (06/17/2018), 900 mg (06/30/2018), 900 mg (07/13/2018) oxaliplatin (ELOXATIN) 185 mg in dextrose 5 % 500 mL chemo infusion, 85 mg/m2 = 185 mg, Intravenous,  Once, 8 of 12 cycles Administration: 185 mg (04/21/2018), 185 mg (05/05/2018), 185 mg (05/19/2018), 185 mg (06/02/2018), 185 mg (06/17/2018), 185 mg (06/30/2018), 185 mg (07/13/2018) fluorouracil (ADRUCIL) chemo injection 900 mg, 400 mg/m2 = 900 mg, Intravenous,  Once, 8 of 12 cycles Administration: 900 mg (04/21/2018), 900 mg (05/05/2018), 900 mg (05/19/2018), 900 mg (06/02/2018), 900 mg (06/17/2018), 900 mg (06/30/2018), 900 mg (07/13/2018) fosaprepitant (EMEND) 150 mg, dexamethasone (DECADRON) 12 mg in sodium chloride 0.9 % 145 mL IVPB, , Intravenous,  Once, 5 of 9 cycles Administration:  (06/02/2018),  (06/17/2018),  (06/30/2018),  (07/13/2018) fluorouracil (ADRUCIL) 5,250 mg in sodium chloride 0.9 % 145 mL chemo infusion, 2,400 mg/m2 = 5,250 mg, Intravenous, 1 Day/Dose, 8 of 12 cycles Administration: 5,250 mg (04/21/2018), 5,250 mg (05/05/2018), 5,250 mg (05/19/2018), 5,250 mg (06/02/2018), 5,250 mg (06/17/2018), 5,250 mg (06/30/2018), 5,250 mg (07/13/2018)  for chemotherapy treatment.       CANCER STAGING: Cancer Staging No matching staging information was found for the patient.   INTERVAL HISTORY:  [name] 43 y.o. male returns for routine follow-up and consideration for next cycle of chemotherapy. He is here today alone. He states that he has not had tingling and numbness in his hands or feet. Denies any nausea, vomiting, or diarrhea. Denies any new pains. Had not noticed any recent bleeding such as epistaxis, hematuria or hematochezia. Denies recent chest pain on exertion, shortness of breath on minimal exertion, pre-syncopal episodes, or palpitations. Denies any recent fevers, infections, or recent hospitalizations. Patient reports appetite at 100%  and energy level at 75%.   REVIEW OF SYSTEMS:  Review of Systems  All other systems  reviewed and are negative.    PAST MEDICAL/SURGICAL HISTORY:  Past Medical History:  Diagnosis Date  . Family history of breast cancer   . GERD (gastroesophageal reflux disease)   . History of kidney stones   . Hypercholesterolemia   . Hypertension   . Urethral stricture    Past Surgical History:  Procedure Laterality Date  . BALLOON DILATION  04/06/2012   Procedure: BALLOON DILATION;  Surgeon: Reece Packer, MD;  Location: [facility];  Service: Urology;  Laterality: N/A;  . BIOPSY  02/24/2018   Procedure: BIOPSY;  Surgeon: Danie Binder, MD;  [facility];  Service: Endoscopy;;  colon  . COLONOSCOPY WITH PROPOFOL N/A 02/24/2018   Procedure: COLONOSCOPY WITH PROPOFOL;  Surgeon: Danie Binder, MD;  Location: [facility];  Service: Endoscopy;  Laterality: N/A;  9:30am  . ESOPHAGOGASTRODUODENOSCOPY (EGD) WITH PROPOFOL N/A 08/29/2015   Dr. Oneida Alar: possible proximal esophageal web s/p dilation, moderate gastritis, negative eosinophilic esophagitis   . PARTIAL COLECTOMY N/A 03/02/2018   Procedure: PARTIAL COLECTOMY;  Surgeon: Aviva Signs, MD;  Location: [facility];  Service: General;  Laterality: N/A;  . PORTACATH PLACEMENT Left 04/13/2018   Procedure: INSERTION PORT-A-CATH;  Surgeon: Aviva Signs, MD;  Location: [facility];  Service: General;  Laterality: Left;  . SAVORY DILATION N/A 08/29/2015   Procedure: SAVORY DILATION;  Surgeon: Danie Binder, MD;  Location[facility];  Service: Endoscopy;  Laterality: N/A;  . URETHRAL DILATION    . WISDOM TOOTH EXTRACTION       SOCIAL HISTORY:  Social History   Socioeconomic History  . Marital status: Divorced    Spouse name: Not on file  . Number of children: Not on file  . Years of education: Not on file  . Highest education level: Not on file  Occupational History  . Not on file  Social Needs  . Financial resource strain: Not on file  . Food insecurity:    Worry: Not on file    Inability: Not on  file  . Transportation needs:    Medical: Not on file    Non-medical: Not on file  Tobacco Use  . Smoking status: Former Smoker    Packs/day: 0.25    Years: 9.00    Pack years: 2.25    Types: Cigarettes    Last attempt to quit: 07/27/2011    Years since quitting: 6.9  . Smokeless tobacco: Never Used  Substance and Sexual Activity  . Alcohol use: Yes    Alcohol/week: 6.0 standard drinks    Types: 6 Cans of beer per week  . Drug use: No  . Sexual activity: Yes    Birth control/protection: None  Lifestyle  . Physical activity:    Days per week: Not on file    Minutes per session: Not on file  . Stress: Not on file  Relationships  . Social connections:    Talks on phone: Not on file    Gets together: Not on file    Attends religious service: Not on file    Active member of club or organization: Not on file    Attends meetings of clubs or organizations: Not on file    Relationship status: Not on file  . Intimate partner violence:    Fear of current or ex partner: Not on file    Emotionally abused: Not  on file    Physically abused: Not on file    Forced sexual activity: Not on file  Other Topics Concern  . Not on file  Social History Narrative  . Not on file    FAMILY HISTORY:  Family History  Problem Relation Age of Onset  . Dementia Mother   . Dementia Maternal Aunt   . Dementia Maternal Grandmother   . COPD Maternal Grandfather   . Breast cancer Maternal Aunt 42  . Breast cancer Maternal Aunt        dx in her 68s  . Lung cancer Maternal Aunt   . Healthy Son   . Colon cancer Neg Hx     CURRENT MEDICATIONS:  Outpatient Encounter Medications as of 07/13/2018  Medication Sig  . Ascorbic Acid (VITAMIN C) 1000 MG tablet Take 1,000 mg by mouth daily.  Marland Kitchen esomeprazole (NEXIUM) 40 MG capsule Take 40 mg by mouth daily.   . fluorouracil CALGB 49675 in sodium chloride 0.9 % 150 mL Inject 6,150 mg into the vein every 14 (fourteen) days. Over 46 hours  . LEUCOVORIN CALCIUM  IV Inject 876 mg into the vein every 14 (fourteen) days.  Marland Kitchen lidocaine (XYLOCAINE) 2 % solution Use as directed 15 mLs in the mouth or throat 2 (two) times daily.  Marland Kitchen lidocaine-prilocaine (EMLA) cream Apply small amount to port site one hour prior to appointment. Cover with plastic wrap  . lisinopril (PRINIVIL,ZESTRIL) 10 MG tablet Take 10 mg by mouth daily.   . Magnesium 500 MG CAPS Take 500 mg by mouth daily.   . Multiple Vitamin (MULTIVITAMIN) tablet Take 1 tablet by mouth daily.  . ondansetron (ZOFRAN) 8 MG tablet Take 1 tablet (8 mg total) by mouth every 8 (eight) hours as needed for nausea or vomiting (may start taking as needed two days after chemotherapy).  . OXALIPLATIN IV Inject 185 mg into the vein every 14 (fourteen) days.  . Potassium (POTASSIMIN PO) Take 1 tablet by mouth daily.   . pravastatin (PRAVACHOL) 20 MG tablet Take 20 mg by mouth daily.   . promethazine (PHENERGAN) 25 MG tablet Take 1 tablet (25 mg total) by mouth every 6 (six) hours as needed for nausea or vomiting.  . topiramate (TOPAMAX) 100 MG tablet Take 100 mg by mouth 2 (two) times daily.    No facility-administered encounter medications on file as of 07/13/2018.     ALLERGIES:  No Known Allergies   PHYSICAL EXAM:  ECOG Performance status: 1  Vitals:   07/13/18 0832  BP: 135/86  Pulse: 76  Resp: 18  Temp: 98.5 F (36.9 C)  SpO2: 98%   Filed Weights   07/13/18 0832  Weight: 222 lb 6.4 oz (100.9 kg)    Physical Exam Vitals signs reviewed.  Constitutional:      Appearance: Normal appearance.  Cardiovascular:     Rate and Rhythm: Normal rate and regular rhythm.     Heart sounds: Normal heart sounds.  Pulmonary:     Effort: Pulmonary effort is normal.     Breath sounds: Normal breath sounds.  Abdominal:     General: Bowel sounds are normal. There is no distension.     Palpations: Abdomen is soft. There is no mass.  Musculoskeletal:        General: No swelling.  Skin:    General: Skin is warm.   Neurological:     General: No focal deficit present.     Mental Status: He is alert and oriented to  person, place, and time.  Psychiatric:        Mood and Affect: Mood normal.        Behavior: Behavior normal.      LABORATORY DATA:  I have reviewed the labs as listed.  CBC    Component Value Date/Time   WBC 3.5 (L) 07/13/2018 0823   RBC 4.15 (L) 07/13/2018 0823   HGB 12.8 (L) 07/13/2018 0823   HCT 36.8 (L) 07/13/2018 0823   PLT 117 (L) 07/13/2018 0823   MCV 88.7 07/13/2018 0823   MCH 30.8 07/13/2018 0823   MCHC 34.8 07/13/2018 0823   RDW 15.9 (H) 07/13/2018 0823   LYMPHSABS 1.2 07/13/2018 0823   MONOABS 0.3 07/13/2018 0823   EOSABS 0.1 07/13/2018 0823   BASOSABS 0.0 07/13/2018 0823   CMP Latest Ref Rng & Units 07/13/2018 06/30/2018 06/16/2018  Glucose 70 - 99 mg/dL 304(H) 315(H) 226(H)  BUN 6 - 20 mg/dL _0 Creatinine 0.61 - 1.24 mg/dL 0.98 0.86 1.19  Sodium 135 - 145 mmol/L 136 138 136  Potassium 3.5 - 5.1 mmol/L 3.9 3.8 3.9  Chloride 98 - 111 mmol/L 106 109 109  CO2 22 - 32 mmol/L 21(L) 22 20(L)  Calcium 8.9 - 10.3 mg/dL 8.7(L) 8.8(L) 8.7(L)  Total Protein 6.5 - 8.1 g/dL 6.3(L) 6.4(L) 6.6  Total Bilirubin 0.3 - 1.2 mg/dL 0.6 0.5 0.5  Alkaline Phos 38 - 126 U/L 81 63 52  AST 15 - 41 U/L 37 34 28  ALT 0 - 44 U/L 75(H) 57(H) 43       DIAGNOSTIC IMAGING:  I have independently reviewed the scans and discussed with the patient.   I have reviewed Venita Lick LPN's note and agree with the documentation.  I personally performed a face-to-face visit, made revisions and my assessment and plan is as follows.    ASSESSMENT & PLAN:   Malignant neoplasm of sigmoid colon (HCC) 1.  Stage III (pT3 pN2b) sigmoid colon adenocarcinoma, MSI-stable: - Patient had left lower quadrant abdominal pain since October, CT scan done [facility] of the abdomen pelvis showed sigmoid colon thickening. -Colonoscopy on 03/06/2018 showing a fungating infiltrative and polypoid partially  obstructing large mass in the sigmoid colon between 20 to 25 cm from the anal verge.  This was consistent with colon cancer.  - Sigmoid colon segmental resection 03/02/2018 by Dr. Arnoldo Morale. This showed invasive adenocarcinoma, well-differentiated, 3.8 cm, tumor invades through muscularis propria, resection margins negative, 14/35 lymph nodes positive, satellite nodules x3, PT 3, PN2B, MMR preserved. - CT of the chest on 02/26/2018 shows 2 small pulmonary nodules, 7 x 5 mm subpleural nodule in the right major fissure and the 4 mm left lower lobe nodule.  Mild hepatic steatosis was seen. - PET/CT scan on 03/30/2018 shows bilateral hypermetabolic pelvic sidewall lymph nodes, subcentimeter, not in the initial distribution pattern for sigmoid primary.  Lymph nodes are in the bilateral external iliac and right inguinal lymph node (7 mm).  They are most likely reactive.  A pelvic CT in 3 months is recommended.  Given normal CEA level, they are more likely reactive. -6 cycles of adjuvant FOLFOX from 04/21/2018 through 06/30/2018.  - Continues to have some cold sensitivity but denies any neuropathy.  He did experience some nausea. -We will proceed with cycle 7 without any dose modifications.  Mild leukopenia and thrombocytopenia has been stable. -We will reevaluate him in 2 weeks for his treatment.  2.  Family history: -2 maternal aunts had breast  cancer.  No family history of colorectal cancer. -I have recommended genetic testing given his young age at diagnosis.       Orders placed this encounter:  No orders of the defined types were placed in this encounter.     Derek Jack, MD [place] [phone]

## 2018-07-15 ENCOUNTER — Inpatient Hospital Stay (HOSPITAL_COMMUNITY): Payer: BLUE CROSS/BLUE SHIELD

## 2018-07-15 ENCOUNTER — Other Ambulatory Visit: Payer: Self-pay

## 2018-07-15 VITALS — BP 127/77 | HR 76 | Temp 98.8°F | Resp 17

## 2018-07-15 DIAGNOSIS — C187 Malignant neoplasm of sigmoid colon: Secondary | ICD-10-CM | POA: Diagnosis not present

## 2018-07-15 DIAGNOSIS — Z5111 Encounter for antineoplastic chemotherapy: Secondary | ICD-10-CM | POA: Diagnosis not present

## 2018-07-15 DIAGNOSIS — Z452 Encounter for adjustment and management of vascular access device: Secondary | ICD-10-CM | POA: Diagnosis not present

## 2018-07-15 MED ORDER — HEPARIN SOD (PORK) LOCK FLUSH 100 UNIT/ML IV SOLN
500.0000 [IU] | Freq: Once | INTRAVENOUS | Status: AC | PRN
  Administered 2018-07-15: 500 [IU]

## 2018-07-15 MED ORDER — SODIUM CHLORIDE 0.9% FLUSH
10.0000 mL | INTRAVENOUS | Status: DC | PRN
  Administered 2018-07-15: 10 mL
  Filled 2018-07-15: qty 10

## 2018-07-15 NOTE — Progress Notes (Signed)
Vital signs stable. No complaints at this time. Pump d/c'd at this time. Pt has complaints of nausea and states he is taking nausea medication as prescribed.  Discharged from clinic ambulatory. F/U with [facility] as scheduled.

## 2018-07-15 NOTE — Patient Instructions (Signed)
[facility] Discharge Instructions for Patients Receiving Chemotherapy  Today you received the following chemotherapy agents   To help prevent nausea and vomiting after your treatment, we encourage you to take your nausea medication   If you develop nausea and vomiting that is not controlled by your nausea medication, call the clinic.   BELOW ARE SYMPTOMS THAT SHOULD BE REPORTED IMMEDIATELY:  *FEVER GREATER THAN 100.5 F  *CHILLS WITH OR WITHOUT FEVER  NAUSEA AND VOMITING THAT IS NOT CONTROLLED WITH YOUR NAUSEA MEDICATION  *UNUSUAL SHORTNESS OF BREATH  *UNUSUAL BRUISING OR BLEEDING  TENDERNESS IN MOUTH AND THROAT WITH OR WITHOUT PRESENCE OF ULCERS  *URINARY PROBLEMS  *BOWEL PROBLEMS  UNUSUAL RASH Items with * indicate a potential emergency and should be followed up as soon as possible.  Feel free to call the clinic should you have any questions or concerns. The clinic phone number is [phone].  Please show the CHEMO ALERT CARD at check-in to the Emergency Department and triage nurse.   

## 2018-07-21 DIAGNOSIS — C187 Malignant neoplasm of sigmoid colon: Secondary | ICD-10-CM | POA: Diagnosis not present

## 2018-07-27 ENCOUNTER — Other Ambulatory Visit: Payer: Self-pay

## 2018-07-28 ENCOUNTER — Encounter (HOSPITAL_COMMUNITY): Payer: Self-pay | Admitting: Hematology

## 2018-07-28 ENCOUNTER — Inpatient Hospital Stay (HOSPITAL_BASED_OUTPATIENT_CLINIC_OR_DEPARTMENT_OTHER): Payer: BLUE CROSS/BLUE SHIELD | Admitting: Hematology

## 2018-07-28 ENCOUNTER — Inpatient Hospital Stay (HOSPITAL_COMMUNITY): Payer: BLUE CROSS/BLUE SHIELD

## 2018-07-28 DIAGNOSIS — Z5111 Encounter for antineoplastic chemotherapy: Secondary | ICD-10-CM | POA: Diagnosis not present

## 2018-07-28 DIAGNOSIS — Z452 Encounter for adjustment and management of vascular access device: Secondary | ICD-10-CM | POA: Diagnosis not present

## 2018-07-28 DIAGNOSIS — C187 Malignant neoplasm of sigmoid colon: Secondary | ICD-10-CM | POA: Diagnosis not present

## 2018-07-28 LAB — CBC WITH DIFFERENTIAL/PLATELET
Abs Immature Granulocytes: 0.01 10*3/uL (ref 0.00–0.07)
Basophils Absolute: 0 10*3/uL (ref 0.0–0.1)
Basophils Relative: 1 %
Eosinophils Absolute: 0.1 10*3/uL (ref 0.0–0.5)
Eosinophils Relative: 2 %
HCT: 38 % — ABNORMAL LOW (ref 39.0–52.0)
Hemoglobin: 13.1 g/dL (ref 13.0–17.0)
Immature Granulocytes: 0 %
Lymphocytes Relative: 37 %
Lymphs Abs: 1.1 10*3/uL (ref 0.7–4.0)
MCH: 31.3 pg (ref 26.0–34.0)
MCHC: 34.5 g/dL (ref 30.0–36.0)
MCV: 90.7 fL (ref 80.0–100.0)
Monocytes Absolute: 0.4 10*3/uL (ref 0.1–1.0)
Monocytes Relative: 12 %
Neutro Abs: 1.4 10*3/uL — ABNORMAL LOW (ref 1.7–7.7)
Neutrophils Relative %: 48 %
Platelets: 91 10*3/uL — ABNORMAL LOW (ref 150–400)
RBC: 4.19 MIL/uL — ABNORMAL LOW (ref 4.22–5.81)
RDW: 15.5 % (ref 11.5–15.5)
WBC: 2.9 10*3/uL — ABNORMAL LOW (ref 4.0–10.5)
nRBC: 0 % (ref 0.0–0.2)

## 2018-07-28 LAB — COMPREHENSIVE METABOLIC PANEL
ALT: 62 U/L — ABNORMAL HIGH (ref 0–44)
AST: 30 U/L (ref 15–41)
Albumin: 4 g/dL (ref 3.5–5.0)
Alkaline Phosphatase: 71 U/L (ref 38–126)
Anion gap: 9 (ref 5–15)
BUN: 16 mg/dL (ref 6–20)
CO2: 21 mmol/L — ABNORMAL LOW (ref 22–32)
Calcium: 8.8 mg/dL — ABNORMAL LOW (ref 8.9–10.3)
Chloride: 105 mmol/L (ref 98–111)
Creatinine, Ser: 1.13 mg/dL (ref 0.61–1.24)
GFR calc Af Amer: >60 mL/min (ref >60)
GFR calc non Af Amer: >60 mL/min (ref >60)
Glucose, Bld: 478 mg/dL — ABNORMAL HIGH (ref 70–99)
Potassium: 4.1 mmol/L (ref 3.5–5.1)
Sodium: 135 mmol/L (ref 135–145)
Total Bilirubin: 0.8 mg/dL (ref 0.3–1.2)
Total Protein: 6.6 g/dL (ref 6.5–8.1)

## 2018-07-28 MED ORDER — SODIUM CHLORIDE 0.9% FLUSH
20.0000 mL | INTRAVENOUS | Status: DC | PRN
  Administered 2018-07-28: 08:00:00 20 mL via INTRAVENOUS
  Filled 2018-07-28: qty 20

## 2018-07-28 MED ORDER — HEPARIN SOD (PORK) LOCK FLUSH 100 UNIT/ML IV SOLN
500.0000 [IU] | Freq: Once | INTRAVENOUS | Status: AC
  Administered 2018-07-28: 09:00:00 500 [IU] via INTRAVENOUS

## 2018-07-28 NOTE — Patient Instructions (Addendum)
[facility] at [facility] Discharge Instructions  You were seen today by Dr. Katragadda. He went over your recent lab results. He will see you back in 1 week for labs, treatment and follow up.   Thank you for choosing [facility] at [facility] to provide your oncology and hematology care.  To afford each patient quality time with our provider, please arrive at least 15 minutes before your scheduled appointment time.   If you have a lab appointment with the Cancer Center please come in thru the  Main Entrance and check in at the main information desk  You need to re-schedule your appointment should you arrive 10 or more minutes late.  We strive to give you quality time with our providers, and arriving late affects you and other patients whose appointments are after yours.  Also, if you no show three or more times for appointments you may be dismissed from the clinic at the providers discretion.     Again, thank you for choosing [facility].  Our hope is that these requests will decrease the amount of time that you wait before being seen by our physicians.       _____________________________________________________________  Should you have questions after your visit to [facility], please contact our office at [phone] between the hours of 8:00 a.m. and 4:30 p.m.  Voicemails left after 4:00 p.m. will not be returned until the following business day.  For prescription refill requests, have your pharmacy contact our office and allow 72 hours.    Cancer Center Support Programs:   > Cancer Support Group  2nd Tuesday of the month 1pm-2pm, Journey Room    

## 2018-07-28 NOTE — Patient Instructions (Signed)
[facility] at [facility] Discharge Instructions Chemo tx held today due to low blood counts. Follow-up as scheduled. Call clinic for any questions or concerns   Thank you for choosing [place] at [facility] to provide your oncology and hematology care.  To afford each patient quality time with our provider, please arrive at least 15 minutes before your scheduled appointment time.   If you have a lab appointment with the [place] please come in thru the  Main Entrance and check in at the main information desk  You need to re-schedule your appointment should you arrive 10 or more minutes late.  We strive to give you quality time with our providers, and arriving late affects you and other patients whose appointments are after yours.  Also, if you no show three or more times for appointments you may be dismissed from the clinic at the providers discretion.     Again, thank you for choosing [facility].  Our hope is that these requests will decrease the amount of time that you wait before being seen by our physicians.       _____________________________________________________________  Should you have questions after your visit to [facility], please contact our office at [phone] between the hours of 8:00 a.m. and 4:30 p.m.  Voicemails left after 4:00 p.m. will not be returned until the following business day.  For prescription refill requests, have your pharmacy contact our office and allow 72 hours.    Cancer Center Support Programs:   > Cancer Support Group  2nd Tuesday of the month 1pm-2pm, Journey Room

## 2018-07-28 NOTE — Progress Notes (Signed)
0848 Labs reviewed with and pt seen by Dr. Delton Coombes and pt's chemo treatment to be held today due to low platelets and ANC of 1.4 per MD. Pt discharged self ambulatory in satisfactory condition

## 2018-07-28 NOTE — Assessment & Plan Note (Signed)
1.  Stage III (pT3 pN2b) sigmoid colon adenocarcinoma, MSI-stable: - Patient had left lower quadrant abdominal pain since October, CT scan done at [facility] of the abdomen pelvis showed sigmoid colon thickening. -Colonoscopy on 03/06/2018 showing a fungating infiltrative and polypoid partially obstructing large mass in the sigmoid colon between 20 to 25 cm from the anal verge.  This was consistent with colon cancer.  - Sigmoid colon segmental resection 03/02/2018 by Dr. Arnoldo Morale. This showed invasive adenocarcinoma, well-differentiated, 3.8 cm, tumor invades through muscularis propria, resection margins negative, 14/35 lymph nodes positive, satellite nodules x3, PT 3, PN2B, MMR preserved. - CT of the chest on 02/26/2018 shows 2 small pulmonary nodules, 7 x 5 mm subpleural nodule in the right major fissure and the 4 mm left lower lobe nodule.  Mild hepatic steatosis was seen. - PET/CT scan on 03/30/2018 shows bilateral hypermetabolic pelvic sidewall lymph nodes, subcentimeter, not in the initial distribution pattern for sigmoid primary.  Lymph nodes are in the bilateral external iliac and right inguinal lymph node (7 mm).  They are most likely reactive.  A pelvic CT in 3 months is recommended.  Given normal CEA level, they are more likely reactive. -8 cycles of FOLFOX from 04/21/2018 through 07/13/2018. -He developed cold sensitivity which lasted about 4 to 5 days.  He also had nausea for 4 days.  He thinks that Ativan works better for him.  We will give him Ativan at home, 0.5 mg twice daily as needed. -We have reviewed his blood counts.  His ANC is low at 1400.  Hence I have recommended a delay in chemotherapy by 1 week.  However patient wants to stick to the original schedule and would like to come back in 2 weeks. -He will come back in 2 weeks with recheck of labs and potentially cycle 9.  2.  Family history: -2 maternal aunts had breast cancer.  No family history of colorectal cancer. -I have recommended  genetic testing given his young age at diagnosis.

## 2018-07-28 NOTE — Progress Notes (Signed)
[place], [place] [address]   CLINIC:  Medical Oncology/Hematology  PCP:  Sharilyn Sites, [place] [address] [phone]   REASON FOR VISIT:  Follow-up for stage III colon cancer  CURRENT THERAPY:Folfox every 2 weeks   BRIEF ONCOLOGIC HISTORY:    Malignant neoplasm of sigmoid colon [facility])    Initial Diagnosis    Cancer of sigmoid colon ([place])    03/27/2018 Genetic Testing    ALK c.350C>G VUS identified on the multicancer panel.  The Multi-Gene Panel offered by [facility] includes sequencing and/or deletion duplication testing of the following 85 genes: AIP, ALK, APC, ATM, AXIN2,BAP1,  BARD1, BLM, BMPR1A, BRCA1, BRCA2, BRIP1, CASR, CDC73, CDH1, CDK4, CDKN1B, CDKN1C, CDKN2A (p14ARF), CDKN2A (p16INK4a), CEBPA, CHEK2, CTNNA1, DICER1, DIS3L2, EGFR (c.2369C>T, p.Thr790Met variant only), EPCAM (Deletion/duplication testing only), FH, FLCN, GATA2, GPC3, GREM1 (Promoter region deletion/duplication testing only), HOXB13 (c.251G>A, p.Gly84Glu), HRAS, KIT, MAX, MEN1, MET, MITF (c.952G>A, p.Glu318Lys variant only), MLH1, MSH2, MSH3, MSH6, MUTYH, NBN, NF1, NF2, NTHL1, PALB2, PDGFRA, PHOX2B, PMS2, POLD1, POLE, POT1, PRKAR1A, PTCH1, PTEN, RAD50, RAD51C, RAD51D, RB1, RECQL4, RET, RNF43, RUNX1, SDHAF2, SDHA (sequence changes only), SDHB, SDHC, SDHD, SMAD4, SMARCA4, SMARCB1, SMARCE1, STK11, SUFU, TERC, TERT, TMEM127, TP53, TSC1, TSC2, VHL, WRN and WT1.   The report date is 03/27/2018.    04/21/2018 -  Chemotherapy    The patient had palonosetron (ALOXI) injection 0.25 mg, 0.25 mg, Intravenous,  Once, 8 of 12 cycles Administration: 0.25 mg (04/21/2018), 0.25 mg (05/05/2018), 0.25 mg (05/19/2018), 0.25 mg (06/02/2018), 0.25 mg (06/17/2018), 0.25 mg (06/30/2018), 0.25 mg (07/13/2018) leucovorin 800 mg in dextrose 5 % 250 mL infusion, 876 mg, Intravenous,  Once, 8 of 12 cycles Administration: 800 mg (04/21/2018), 900 mg (05/05/2018), 800 mg (05/19/2018), 800  mg (06/02/2018), 900 mg (06/17/2018), 900 mg (06/30/2018), 900 mg (07/13/2018) oxaliplatin (ELOXATIN) 185 mg in dextrose 5 % 500 mL chemo infusion, 85 mg/m2 = 185 mg, Intravenous,  Once, 8 of 12 cycles Administration: 185 mg (04/21/2018), 185 mg (05/05/2018), 185 mg (05/19/2018), 185 mg (06/02/2018), 185 mg (06/17/2018), 185 mg (06/30/2018), 185 mg (07/13/2018) fluorouracil (ADRUCIL) chemo injection 900 mg, 400 mg/m2 = 900 mg, Intravenous,  Once, 8 of 12 cycles Administration: 900 mg (04/21/2018), 900 mg (05/05/2018), 900 mg (05/19/2018), 900 mg (06/02/2018), 900 mg (06/17/2018), 900 mg (06/30/2018), 900 mg (07/13/2018) fosaprepitant (EMEND) 150 mg, dexamethasone (DECADRON) 12 mg in sodium chloride 0.9 % 145 mL IVPB, , Intravenous,  Once, 5 of 9 cycles Administration:  (06/02/2018),  (06/17/2018),  (06/30/2018),  (07/13/2018) fluorouracil (ADRUCIL) 5,250 mg in sodium chloride 0.9 % 145 mL chemo infusion, 2,400 mg/m2 = 5,250 mg, Intravenous, 1 Day/Dose, 8 of 12 cycles Administration: 5,250 mg (04/21/2018), 5,250 mg (05/05/2018), 5,250 mg (05/19/2018), 5,250 mg (06/02/2018), 5,250 mg (06/17/2018), 5,250 mg (06/30/2018), 5,250 mg (07/13/2018)  for chemotherapy treatment.       CANCER STAGING: Cancer Staging No matching staging information was found for the patient.   INTERVAL HISTORY:  Mr. Dunson 43 y.o. male returns for routine follow-up and consideration for next cycle of chemotherapy. He is here today alone. He states that he has nausea that is not controlled by nausea medications at home. He states that he has had constipation and diarrhea at times, but the same as before. He states that he had numbness in his hands, feet and lips for five days after his last treatment, but this is better now. He states that he has sores in his mouth along with  pain that started Saturday. Denies any new pains. Had not noticed any recent bleeding such as epistaxis, hematuria or hematochezia. Denies recent chest pain on exertion, shortness of  breath on minimal exertion, pre-syncopal episodes, or palpitations. Denies any numbness or tingling in hands or feet at this time. Denies any recent fevers, infections, or recent hospitalizations. Patient reports appetite at 100% and energy level at 100%.     REVIEW OF SYSTEMS:  Review of Systems  Gastrointestinal: Positive for constipation, diarrhea and nausea.  Neurological: Positive for numbness.     PAST MEDICAL/SURGICAL HISTORY:  Past Medical History:  Diagnosis Date  . Family history of breast cancer   . GERD (gastroesophageal reflux disease)   . History of kidney stones   . Hypercholesterolemia   . Hypertension   . Urethral stricture    Past Surgical History:  Procedure Laterality Date  . BALLOON DILATION  04/06/2012   Procedure: BALLOON DILATION;  Surgeon: Reece Packer, MD;  Location: [facility];  Service: Urology;  Laterality: N/A;  . BIOPSY  02/24/2018   Procedure: BIOPSY;  Surgeon: Danie Binder, MD;  [facility];  Service: Endoscopy;;  colon  . COLONOSCOPY WITH PROPOFOL N/A 02/24/2018   Procedure: COLONOSCOPY WITH PROPOFOL;  Surgeon: Danie Binder, MD;  Location: [facility];  Service: Endoscopy;  Laterality: N/A;  9:30am  . ESOPHAGOGASTRODUODENOSCOPY (EGD) WITH PROPOFOL N/A 08/29/2015   Dr. Oneida Alar: possible proximal esophageal web s/p dilation, moderate gastritis, negative eosinophilic esophagitis   . PARTIAL COLECTOMY N/A 03/02/2018   Procedure: PARTIAL COLECTOMY;  Surgeon: Aviva Signs, MD;  Location: [facility];  Service: General;  Laterality: N/A;  . PORTACATH PLACEMENT Left 04/13/2018   Procedure: INSERTION PORT-A-CATH;  Surgeon: Aviva Signs, MD;  Location: [facility];  Service: General;  Laterality: Left;  . SAVORY DILATION N/A 08/29/2015   Procedure: SAVORY DILATION;  Surgeon: Danie Binder, MD;  [facility]: [facility];  Service: Endoscopy;  Laterality: N/A;  . URETHRAL DILATION    . WISDOM TOOTH EXTRACTION       SOCIAL  HISTORY:  Social History   Socioeconomic History  . Marital status: Divorced    Spouse name: Not on file  . Number of children: Not on file  . Years of education: Not on file  . Highest education level: Not on file  Occupational History  . Not on file  Social Needs  . Financial resource strain: Not on file  . Food insecurity:    Worry: Not on file    Inability: Not on file  . Transportation needs:    Medical: Not on file    Non-medical: Not on file  Tobacco Use  . Smoking status: Former Smoker    Packs/day: 0.25    Years: 9.00    Pack years: 2.25    Types: Cigarettes    Last attempt to quit: 07/27/2011    Years since quitting: 7.0  . Smokeless tobacco: Never Used  Substance and Sexual Activity  . Alcohol use: Yes    Alcohol/week: 6.0 standard drinks    Types: 6 Cans of beer per week  . Drug use: No  . Sexual activity: Yes    Birth control/protection: None  Lifestyle  . Physical activity:    Days per week: Not on file    Minutes per session: Not on file  . Stress: Not on file  Relationships  . Social connections:    Talks on phone: Not on file  Gets together: Not on file    Attends religious service: Not on file    Active member of club or organization: Not on file    Attends meetings of clubs or organizations: Not on file    Relationship status: Not on file  . Intimate partner violence:    Fear of current or ex partner: Not on file    Emotionally abused: Not on file    Physically abused: Not on file    Forced sexual activity: Not on file  Other Topics Concern  . Not on file  Social History Narrative  . Not on file    FAMILY HISTORY:  Family History  Problem Relation Age of Onset  . Dementia Mother   . Dementia Maternal Aunt   . Dementia Maternal Grandmother   . COPD Maternal Grandfather   . Breast cancer Maternal Aunt 42  . Breast cancer Maternal Aunt        dx in her 55s  . Lung cancer Maternal Aunt   . Healthy Son   . Colon cancer Neg Hx      CURRENT MEDICATIONS:  Outpatient Encounter Medications as of 07/28/2018  Medication Sig  . Ascorbic Acid (VITAMIN C) 1000 MG tablet Take 1,000 mg by mouth daily.  Marland Kitchen esomeprazole (NEXIUM) 40 MG capsule Take 40 mg by mouth daily.   . fluorouracil CALGB 54008 in sodium chloride 0.9 % 150 mL Inject 6,150 mg into the vein every 14 (fourteen) days. Over 46 hours  . LEUCOVORIN CALCIUM IV Inject 876 mg into the vein every 14 (fourteen) days.  Marland Kitchen lidocaine (XYLOCAINE) 2 % solution Use as directed 15 mLs in the mouth or throat 2 (two) times daily.  Marland Kitchen lidocaine-prilocaine (EMLA) cream Apply small amount to port site one hour prior to appointment. Cover with plastic wrap  . lisinopril (PRINIVIL,ZESTRIL) 10 MG tablet Take 10 mg by mouth daily.   . Magnesium 500 MG CAPS Take 500 mg by mouth daily.   . Multiple Vitamin (MULTIVITAMIN) tablet Take 1 tablet by mouth daily.  . ondansetron (ZOFRAN) 8 MG tablet Take 1 tablet (8 mg total) by mouth every 8 (eight) hours as needed for nausea or vomiting (may start taking as needed two days after chemotherapy).  . OXALIPLATIN IV Inject 185 mg into the vein every 14 (fourteen) days.  . Potassium (POTASSIMIN PO) Take 1 tablet by mouth daily.   . pravastatin (PRAVACHOL) 20 MG tablet Take 20 mg by mouth daily.   . promethazine (PHENERGAN) 25 MG tablet Take 1 tablet (25 mg total) by mouth every 6 (six) hours as needed for nausea or vomiting.  . topiramate (TOPAMAX) 100 MG tablet Take 100 mg by mouth 2 (two) times daily.    No facility-administered encounter medications on file as of 07/28/2018.     ALLERGIES:  No Known Allergies   PHYSICAL EXAM:  ECOG Performance status: 1  Vitals:   07/28/18 0746  Temp: 98.6 F (37 C)   There were no vitals filed for this visit.  Physical Exam Vitals signs reviewed.  Constitutional:      Appearance: Normal appearance.  Cardiovascular:     Rate and Rhythm: Normal rate and regular rhythm.     Heart sounds: Normal heart  sounds.  Pulmonary:     Effort: Pulmonary effort is normal.     Breath sounds: Normal breath sounds.  Abdominal:     General: There is no distension.     Palpations: Abdomen is soft. There is no  mass.  Musculoskeletal:        General: No swelling.  Skin:    General: Skin is warm.  Neurological:     General: No focal deficit present.     Mental Status: He is alert and oriented to person, place, and time.  Psychiatric:        Mood and Affect: Mood normal.        Behavior: Behavior normal.      LABORATORY DATA:  I have reviewed the labs as listed.  CBC    Component Value Date/Time   WBC 2.9 (L) 07/28/2018 0746   RBC 4.19 (L) 07/28/2018 0746   HGB 13.1 07/28/2018 0746   HCT 38.0 (L) 07/28/2018 0746   PLT 91 (L) 07/28/2018 0746   MCV 90.7 07/28/2018 0746   MCH 31.3 07/28/2018 0746   MCHC 34.5 07/28/2018 0746   RDW 15.5 07/28/2018 0746   LYMPHSABS 1.1 07/28/2018 0746   MONOABS 0.4 07/28/2018 0746   EOSABS 0.1 07/28/2018 0746   BASOSABS 0.0 07/28/2018 0746   CMP Latest Ref Rng & Units 07/28/2018 07/13/2018 06/30/2018  Glucose 70 - 99 mg/dL 478(H) 304(H) 315(H)  BUN 6 - 20 mg/dL _0 Creatinine 0.61 - 1.24 mg/dL 1.13 0.98 0.86  Sodium 135 - 145 mmol/L 135 136 138  Potassium 3.5 - 5.1 mmol/L 4.1 3.9 3.8  Chloride 98 - 111 mmol/L 105 106 109  CO2 22 - 32 mmol/L 21(L) 21(L) 22  Calcium 8.9 - 10.3 mg/dL 8.8(L) 8.7(L) 8.8(L)  Total Protein 6.5 - 8.1 g/dL 6.6 6.3(L) 6.4(L)  Total Bilirubin 0.3 - 1.2 mg/dL 0.8 0.6 0.5  Alkaline Phos 38 - 126 U/L 71 81 63  AST 15 - 41 U/L 30 37 34  ALT 0 - 44 U/L 62(H) 75(H) 57(H)       DIAGNOSTIC IMAGING:  I have independently reviewed the scans and discussed with the patient.   I have reviewed Venita Lick LPN's note and agree with the documentation.  I personally performed a face-to-face visit, made revisions and my assessment and plan is as follows.    ASSESSMENT & PLAN:   Malignant neoplasm of sigmoid colon (HCC) 1.  Stage  III (pT3 pN2b) sigmoid colon adenocarcinoma, MSI-stable: - Patient had left lower quadrant abdominal pain since October, CT scan done at [facility] of the abdomen pelvis showed sigmoid colon thickening. -Colonoscopy on 03/06/2018 showing a fungating infiltrative and polypoid partially obstructing large mass in the sigmoid colon between 20 to 25 cm from the anal verge.  This was consistent with colon cancer.  - Sigmoid colon segmental resection 03/02/2018 by Dr. Arnoldo Morale. This showed invasive adenocarcinoma, well-differentiated, 3.8 cm, tumor invades through muscularis propria, resection margins negative, 14/35 lymph nodes positive, satellite nodules x3, PT 3, PN2B, MMR preserved. - CT of the chest on 02/26/2018 shows 2 small pulmonary nodules, 7 x 5 mm subpleural nodule in the right major fissure and the 4 mm left lower lobe nodule.  Mild hepatic steatosis was seen. - PET/CT scan on 03/30/2018 shows bilateral hypermetabolic pelvic sidewall lymph nodes, subcentimeter, not in the initial distribution pattern for sigmoid primary.  Lymph nodes are in the bilateral external iliac and right inguinal lymph node (7 mm).  They are most likely reactive.  A pelvic CT in 3 months is recommended.  Given normal CEA level, they are more likely reactive. -8 cycles of FOLFOX from 04/21/2018 through 07/13/2018. -He developed cold sensitivity which lasted about 4 to 5 days.  He also  had nausea for 4 days.  He thinks that Ativan works better for him.  We will give him Ativan at home, 0.5 mg twice daily as needed. -We have reviewed his blood counts.  His ANC is low at 1400.  Hence I have recommended a delay in chemotherapy by 1 week.  However patient wants to stick to the original schedule and would like to come back in 2 weeks. -He will come back in 2 weeks with recheck of labs and potentially cycle 9.  2.  Family history: -2 maternal aunts had breast cancer.  No family history of colorectal cancer. -I have recommended genetic  testing given his young age at diagnosis.       Orders placed this encounter:  No orders of the defined types were placed in this encounter.     Derek Jack, MD [place] [phone]

## 2018-07-29 ENCOUNTER — Other Ambulatory Visit: Payer: Self-pay

## 2018-07-30 ENCOUNTER — Encounter (HOSPITAL_COMMUNITY): Payer: Self-pay

## 2018-08-04 NOTE — Progress Notes (Signed)
REVIEWED-NO ADDITIONAL RECOMMENDATIONS. 

## 2018-08-10 ENCOUNTER — Other Ambulatory Visit (HOSPITAL_COMMUNITY): Payer: Self-pay

## 2018-08-10 DIAGNOSIS — C187 Malignant neoplasm of sigmoid colon: Secondary | ICD-10-CM

## 2018-08-11 ENCOUNTER — Inpatient Hospital Stay (HOSPITAL_COMMUNITY): Payer: BC Managed Care – PPO

## 2018-08-11 ENCOUNTER — Inpatient Hospital Stay (HOSPITAL_BASED_OUTPATIENT_CLINIC_OR_DEPARTMENT_OTHER): Payer: BC Managed Care – PPO | Admitting: Hematology

## 2018-08-11 ENCOUNTER — Encounter (HOSPITAL_COMMUNITY): Payer: Self-pay | Admitting: Hematology

## 2018-08-11 ENCOUNTER — Other Ambulatory Visit: Payer: Self-pay

## 2018-08-11 ENCOUNTER — Inpatient Hospital Stay (HOSPITAL_COMMUNITY): Payer: BC Managed Care – PPO | Attending: Hematology

## 2018-08-11 VITALS — BP 130/79 | HR 82 | Temp 98.1°F | Resp 18

## 2018-08-11 DIAGNOSIS — C187 Malignant neoplasm of sigmoid colon: Secondary | ICD-10-CM

## 2018-08-11 DIAGNOSIS — Z79899 Other long term (current) drug therapy: Secondary | ICD-10-CM

## 2018-08-11 DIAGNOSIS — Z5111 Encounter for antineoplastic chemotherapy: Secondary | ICD-10-CM | POA: Diagnosis not present

## 2018-08-11 DIAGNOSIS — R918 Other nonspecific abnormal finding of lung field: Secondary | ICD-10-CM | POA: Diagnosis not present

## 2018-08-11 DIAGNOSIS — I10 Essential (primary) hypertension: Secondary | ICD-10-CM | POA: Diagnosis not present

## 2018-08-11 DIAGNOSIS — Z87891 Personal history of nicotine dependence: Secondary | ICD-10-CM | POA: Diagnosis not present

## 2018-08-11 DIAGNOSIS — Z803 Family history of malignant neoplasm of breast: Secondary | ICD-10-CM | POA: Insufficient documentation

## 2018-08-11 DIAGNOSIS — E119 Type 2 diabetes mellitus without complications: Secondary | ICD-10-CM | POA: Diagnosis not present

## 2018-08-11 DIAGNOSIS — Z7984 Long term (current) use of oral hypoglycemic drugs: Secondary | ICD-10-CM | POA: Insufficient documentation

## 2018-08-11 LAB — COMPREHENSIVE METABOLIC PANEL
ALT: 74 U/L — ABNORMAL HIGH (ref 0–44)
AST: 28 U/L (ref 15–41)
Albumin: 3.7 g/dL (ref 3.5–5.0)
Alkaline Phosphatase: 82 U/L (ref 38–126)
Anion gap: 9 (ref 5–15)
BUN: 15 mg/dL (ref 6–20)
CO2: 22 mmol/L (ref 22–32)
Calcium: 8.6 mg/dL — ABNORMAL LOW (ref 8.9–10.3)
Chloride: 105 mmol/L (ref 98–111)
Creatinine, Ser: 0.81 mg/dL (ref 0.61–1.24)
GFR calc Af Amer: >60 mL/min (ref >60)
GFR calc non Af Amer: >60 mL/min (ref >60)
Glucose, Bld: 466 mg/dL — ABNORMAL HIGH (ref 70–99)
Potassium: 4 mmol/L (ref 3.5–5.1)
Sodium: 136 mmol/L (ref 135–145)
Total Bilirubin: 0.7 mg/dL (ref 0.3–1.2)
Total Protein: 6.1 g/dL — ABNORMAL LOW (ref 6.5–8.1)

## 2018-08-11 LAB — CBC WITH DIFFERENTIAL/PLATELET
Abs Immature Granulocytes: 0.01 10*3/uL (ref 0.00–0.07)
Basophils Absolute: 0 10*3/uL (ref 0.0–0.1)
Basophils Relative: 1 %
Eosinophils Absolute: 0.1 10*3/uL (ref 0.0–0.5)
Eosinophils Relative: 2 %
HCT: 37.9 % — ABNORMAL LOW (ref 39.0–52.0)
Hemoglobin: 13.1 g/dL (ref 13.0–17.0)
Immature Granulocytes: 0 %
Lymphocytes Relative: 29 %
Lymphs Abs: 1.2 10*3/uL (ref 0.7–4.0)
MCH: 31.7 pg (ref 26.0–34.0)
MCHC: 34.6 g/dL (ref 30.0–36.0)
MCV: 91.8 fL (ref 80.0–100.0)
Monocytes Absolute: 0.4 10*3/uL (ref 0.1–1.0)
Monocytes Relative: 8 %
Neutro Abs: 2.5 10*3/uL (ref 1.7–7.7)
Neutrophils Relative %: 60 %
Platelets: 113 10*3/uL — ABNORMAL LOW (ref 150–400)
RBC: 4.13 MIL/uL — ABNORMAL LOW (ref 4.22–5.81)
RDW: 13.7 % (ref 11.5–15.5)
WBC: 4.3 10*3/uL (ref 4.0–10.5)
nRBC: 0 % (ref 0.0–0.2)

## 2018-08-11 LAB — HEMOGLOBIN A1C
Hgb A1c MFr Bld: 8.2 % — ABNORMAL HIGH (ref 4.8–5.6)
Mean Plasma Glucose: 188.64 mg/dL

## 2018-08-11 MED ORDER — LEUCOVORIN CALCIUM INJECTION 350 MG
411.0000 mg/m2 | Freq: Once | INTRAVENOUS | Status: AC
  Administered 2018-08-11: 900 mg via INTRAVENOUS
  Filled 2018-08-11: qty 45

## 2018-08-11 MED ORDER — SODIUM CHLORIDE 0.9% FLUSH
10.0000 mL | INTRAVENOUS | Status: DC | PRN
  Administered 2018-08-11: 10 mL
  Filled 2018-08-11: qty 10

## 2018-08-11 MED ORDER — SODIUM CHLORIDE 0.9 % IV SOLN
2400.0000 mg/m2 | INTRAVENOUS | Status: DC
  Administered 2018-08-11: 5250 mg via INTRAVENOUS
  Filled 2018-08-11: qty 105

## 2018-08-11 MED ORDER — OXALIPLATIN CHEMO INJECTION 100 MG/20ML
85.0000 mg/m2 | Freq: Once | INTRAVENOUS | Status: AC
  Administered 2018-08-11: 185 mg via INTRAVENOUS
  Filled 2018-08-11: qty 37

## 2018-08-11 MED ORDER — LORAZEPAM 0.5 MG PO TABS: 0.5000 mg | ORAL_TABLET | Freq: Three times a day (TID) | ORAL | 2 refills | Status: DC

## 2018-08-11 MED ORDER — FLUOROURACIL CHEMO INJECTION 2.5 GM/50ML
400.0000 mg/m2 | Freq: Once | INTRAVENOUS | Status: AC
  Administered 2018-08-11: 900 mg via INTRAVENOUS
  Filled 2018-08-11: qty 18

## 2018-08-11 MED ORDER — DEXTROSE 5 % IV SOLN
Freq: Once | INTRAVENOUS | Status: AC
  Administered 2018-08-11: 09:00:00 via INTRAVENOUS

## 2018-08-11 MED ORDER — LORAZEPAM 2 MG/ML IJ SOLN
1.0000 mg | Freq: Once | INTRAMUSCULAR | Status: AC
  Administered 2018-08-11: 1 mg via INTRAVENOUS
  Filled 2018-08-11: qty 1

## 2018-08-11 MED ORDER — PALONOSETRON HCL INJECTION 0.25 MG/5ML
0.2500 mg | Freq: Once | INTRAVENOUS | Status: AC
  Administered 2018-08-11: 09:00:00 0.25 mg via INTRAVENOUS
  Filled 2018-08-11: qty 5

## 2018-08-11 MED ORDER — SODIUM CHLORIDE 0.9 % IV SOLN
Freq: Once | INTRAVENOUS | Status: AC
  Administered 2018-08-11: 10:00:00 via INTRAVENOUS
  Filled 2018-08-11: qty 5

## 2018-08-11 MED ORDER — DEXTROSE 5 % IV SOLN
Freq: Once | INTRAVENOUS | Status: AC
  Administered 2018-08-11: 10:00:00 via INTRAVENOUS

## 2018-08-11 NOTE — Patient Instructions (Signed)
[facility] Discharge Instructions for Patients Receiving Chemotherapy   Beginning January 23rd 2017 lab work for the [facility] will be done in the  Main lab at [facility] on 1st floor. If you have a lab appointment with the [place] [facility] please come in thru the  Main Entrance and check in at the main information desk   Today you received the following chemotherapy agents Oxalipatin, Leucovorin and 5FU. Follow-up as scheduled. Call clinic for any questions or concerns  To help prevent nausea and vomiting after your treatment, we encourage you to take your nausea medication   If you develop nausea and vomiting, or diarrhea that is not controlled by your medication, call the clinic.  The clinic phone number is [phone]. Office hours are Monday-Friday 8:30am-5:00pm.  BELOW ARE SYMPTOMS THAT SHOULD BE REPORTED IMMEDIATELY:  *FEVER GREATER THAN 101.0 F  *CHILLS WITH OR WITHOUT FEVER  NAUSEA AND VOMITING THAT IS NOT CONTROLLED WITH YOUR NAUSEA MEDICATION  *UNUSUAL SHORTNESS OF BREATH  *UNUSUAL BRUISING OR BLEEDING  TENDERNESS IN MOUTH AND THROAT WITH OR WITHOUT PRESENCE OF ULCERS  *URINARY PROBLEMS  *BOWEL PROBLEMS  UNUSUAL RASH Items with * indicate a potential emergency and should be followed up as soon as possible. If you have an emergency after office hours please contact your primary care physician or go to the nearest emergency department.  Please call the clinic during office hours if you have any questions or concerns.   You may also contact the Patient Navigator at [phone] should you have any questions or need assistance in obtaining follow up care.      Resources For Cancer Patients and their Caregivers ? American Cancer Society: Can assist with transportation, wigs, general needs, runs Look Good Feel Better.        234-214-2072 ? Cancer Care: Provides financial assistance, online support groups, medication/co-pay assistance.   1-800-813-HOPE 5046943826) ? [employer] Assists [place] cancer patients and their families through emotional , educational and financial support.  5813944060 ? [place] DSS Where to apply for food stamps, Medicaid and utility assistance. 815-392-1328 ? [facility]: Transportation to medical appointments. (819)162-0648 ? Social Security Administration: May apply for disability if have a Stage IV cancer. 308-452-9199 6164396711 ? [employer], Disability and Transit Services: Assists with nutrition, care and transit needs. [phone]

## 2018-08-11 NOTE — Assessment & Plan Note (Signed)
1.  Stage III (pT3 pN2b) sigmoid colon adenocarcinoma, MSI-stable: - Patient had left lower quadrant abdominal pain since October, CT scan done at [facility] of the abdomen pelvis showed sigmoid colon thickening. -Colonoscopy on 03/06/2018 showing a fungating infiltrative and polypoid partially obstructing large mass in the sigmoid colon between 20 to 25 cm from the anal verge.  This was consistent with colon cancer.  - Sigmoid colon segmental resection 03/02/2018 by Dr. Arnoldo Morale. This showed invasive adenocarcinoma, well-differentiated, 3.8 cm, tumor invades through muscularis propria, resection margins negative, 14/35 lymph nodes positive, satellite nodules x3, PT 3, PN2B, MMR preserved. - CT of the chest on 02/26/2018 shows 2 small pulmonary nodules, 7 x 5 mm subpleural nodule in the right major fissure and the 4 mm left lower lobe nodule.  Mild hepatic steatosis was seen. - PET/CT scan on 03/30/2018 shows bilateral hypermetabolic pelvic sidewall lymph nodes, subcentimeter, not in the initial distribution pattern for sigmoid primary.  Lymph nodes are in the bilateral external iliac and right inguinal lymph node (7 mm).  They are most likely reactive.  A pelvic CT in 3 months is recommended.  Given normal CEA level, they are more likely reactive. -7 cycles of FOLFOX from 04/21/2018 through 07/13/2018. -He is having cold sensitivity after each cycle lasting about 4 to 5 days. -He thinks Ativan works better for nausea.  I will send him a prescription for Ativan 0.5 mg twice daily as needed. -We held his last chemotherapy on 07/28/2018 secondary to [place] count being low at 1400.  He had very good 2 weeks.  I have reviewed his CBC.  He may proceed with cycle 8 of chemotherapy today. -We will see him back in 2 weeks for follow-up.  2.  Family history: -2 maternal aunts had breast cancer.  No family history of colorectal cancer. -I have recommended genetic testing given his young age at diagnosis.

## 2018-08-11 NOTE — Progress Notes (Signed)
[place] [address], [place] [address]   CLINIC:  Medical Oncology/Hematology  PCP:  Sharilyn Sites, [place] [address] [phone]   REASON FOR VISIT:  Follow-up for stage III colon cancer  CURRENT THERAPY:Folfox every 2 weeks   BRIEF ONCOLOGIC HISTORY:    Malignant neoplasm of sigmoid colon [facility])    Initial Diagnosis    Cancer of sigmoid colon ([place])    03/27/2018 Genetic Testing    ALK c.350C>G VUS identified on the multicancer panel.  The Multi-Gene Panel offered by [facility] includes sequencing and/or deletion duplication testing of the following 85 genes: AIP, ALK, APC, ATM, AXIN2,BAP1,  BARD1, BLM, BMPR1A, BRCA1, BRCA2, BRIP1, CASR, CDC73, CDH1, CDK4, CDKN1B, CDKN1C, CDKN2A (p14ARF), CDKN2A (p16INK4a), CEBPA, CHEK2, CTNNA1, DICER1, DIS3L2, EGFR (c.2369C>T, p.Thr790Met variant only), EPCAM (Deletion/duplication testing only), FH, FLCN, GATA2, GPC3, GREM1 (Promoter region deletion/duplication testing only), HOXB13 (c.251G>A, p.Gly84Glu), HRAS, KIT, MAX, MEN1, MET, MITF (c.952G>A, p.Glu318Lys variant only), MLH1, MSH2, MSH3, MSH6, MUTYH, NBN, NF1, NF2, NTHL1, PALB2, PDGFRA, PHOX2B, PMS2, POLD1, POLE, POT1, PRKAR1A, PTCH1, PTEN, RAD50, RAD51C, RAD51D, RB1, RECQL4, RET, RNF43, RUNX1, SDHAF2, SDHA (sequence changes only), SDHB, SDHC, SDHD, SMAD4, SMARCA4, SMARCB1, SMARCE1, STK11, SUFU, TERC, TERT, TMEM127, TP53, TSC1, TSC2, VHL, WRN and WT1.   The report date is 03/27/2018.    04/21/2018 -  Chemotherapy    The patient had palonosetron (ALOXI) injection 0.25 mg, 0.25 mg, Intravenous,  Once, 8 of 12 cycles Administration: 0.25 mg (04/21/2018), 0.25 mg (05/05/2018), 0.25 mg (05/19/2018), 0.25 mg (06/02/2018), 0.25 mg (06/17/2018), 0.25 mg (06/30/2018), 0.25 mg (07/13/2018) leucovorin 800 mg in dextrose 5 % 250 mL infusion, 876 mg, Intravenous,  Once, 8 of 12 cycles Administration: 800 mg (04/21/2018), 900 mg (05/05/2018), 800 mg (05/19/2018), 800  mg (06/02/2018), 900 mg (06/17/2018), 900 mg (06/30/2018), 900 mg (07/13/2018) oxaliplatin (ELOXATIN) 185 mg in dextrose 5 % 500 mL chemo infusion, 85 mg/m2 = 185 mg, Intravenous,  Once, 8 of 12 cycles Administration: 185 mg (04/21/2018), 185 mg (05/05/2018), 185 mg (05/19/2018), 185 mg (06/02/2018), 185 mg (06/17/2018), 185 mg (06/30/2018), 185 mg (07/13/2018) fluorouracil (ADRUCIL) chemo injection 900 mg, 400 mg/m2 = 900 mg, Intravenous,  Once, 8 of 12 cycles Administration: 900 mg (04/21/2018), 900 mg (05/05/2018), 900 mg (05/19/2018), 900 mg (06/02/2018), 900 mg (06/17/2018), 900 mg (06/30/2018), 900 mg (07/13/2018) fosaprepitant (EMEND) 150 mg, dexamethasone (DECADRON) 12 mg in sodium chloride 0.9 % 145 mL IVPB, , Intravenous,  Once, 5 of 9 cycles Administration:  (06/02/2018),  (06/17/2018),  (06/30/2018),  (07/13/2018) fluorouracil (ADRUCIL) 5,250 mg in sodium chloride 0.9 % 145 mL chemo infusion, 2,400 mg/m2 = 5,250 mg, Intravenous, 1 Day/Dose, 8 of 12 cycles Administration: 5,250 mg (04/21/2018), 5,250 mg (05/05/2018), 5,250 mg (05/19/2018), 5,250 mg (06/02/2018), 5,250 mg (06/17/2018), 5,250 mg (06/30/2018), 5,250 mg (07/13/2018)  for chemotherapy treatment.       CANCER STAGING: Cancer Staging No matching staging information was found for the patient.   INTERVAL HISTORY:  Mr. Earwood 43 y.o. male returns for follow-up and cycle 9 of chemotherapy.  His last chemotherapy was held due to neutropenia.  He has been off 2 weeks without chemo.  Denies any fevers, night sweats or weight loss.  Denies any ER visits or hospitalizations.  Denies any nausea, vomiting or diarrhea/constipation.  He does report cold sensitivity after each cycle.  Appetite is 100% and energy levels are 75%.     REVIEW OF SYSTEMS:  Review of Systems  Gastrointestinal: Negative for  constipation, diarrhea and nausea.  Neurological: Positive for numbness.  All other systems reviewed and are negative.    PAST MEDICAL/SURGICAL HISTORY:  Past  Medical History:  Diagnosis Date  . Family history of breast cancer   . GERD (gastroesophageal reflux disease)   . History of kidney stones   . Hypercholesterolemia   . Hypertension   . Urethral stricture    Past Surgical History:  Procedure Laterality Date  . BALLOON DILATION  04/06/2012   Procedure: BALLOON DILATION;  Surgeon: Reece Packer, MD;  Location: [facility];  Service: Urology;  Laterality: N/A;  . BIOPSY  02/24/2018   Procedure: BIOPSY;  Surgeon: Danie Binder, MD;  Location: [facility];  Service: Endoscopy;;  colon  . COLONOSCOPY WITH PROPOFOL N/A 02/24/2018   Procedure: COLONOSCOPY WITH PROPOFOL;  Surgeon: Danie Binder, MD;  Location: [facility];  Service: Endoscopy;  Laterality: N/A;  9:30am  . ESOPHAGOGASTRODUODENOSCOPY (EGD) WITH PROPOFOL N/A 08/29/2015   Dr. Oneida Alar: possible proximal esophageal web s/p dilation, moderate gastritis, negative eosinophilic esophagitis   . PARTIAL COLECTOMY N/A 03/02/2018   Procedure: PARTIAL COLECTOMY;  Surgeon: Aviva Signs, MD;  Location: [facility];  Service: General;  Laterality: N/A;  . PORTACATH PLACEMENT Left 04/13/2018   Procedure: INSERTION PORT-A-CATH;  Surgeon: Aviva Signs, MD;  Location: [facility];  Service: General;  Laterality: Left;  . SAVORY DILATION N/A 08/29/2015   Procedure: SAVORY DILATION;  Surgeon: Danie Binder, MD;  Location: [facility];  Service: Endoscopy;  Laterality: N/A;  . URETHRAL DILATION    . WISDOM TOOTH EXTRACTION       SOCIAL HISTORY:  Social History   Socioeconomic History  . Marital status: Divorced    Spouse name: Not on file  . Number of children: Not on file  . Years of education: Not on file  . Highest education level: Not on file  Occupational History  . Not on file  Social Needs  . Financial resource strain: Not on file  . Food insecurity:    Worry: Not on file    Inability: Not on file  . Transportation needs:    Medical: Not on file    Non-medical:  Not on file  Tobacco Use  . Smoking status: Former Smoker    Packs/day: 0.25    Years: 9.00    Pack years: 2.25    Types: Cigarettes    Last attempt to quit: 07/27/2011    Years since quitting: 7.0  . Smokeless tobacco: Never Used  Substance and Sexual Activity  . Alcohol use: Yes    Alcohol/week: 6.0 standard drinks    Types: 6 Cans of beer per week  . Drug use: No  . Sexual activity: Yes    Birth control/protection: None  Lifestyle  . Physical activity:    Days per week: Not on file    Minutes per session: Not on file  . Stress: Not on file  Relationships  . Social connections:    Talks on phone: Not on file    Gets together: Not on file    Attends religious service: Not on file    Active member of club or organization: Not on file    Attends meetings of clubs or organizations: Not on file    Relationship status: Not on file  . Intimate partner violence:    Fear of current or ex partner: Not on file    Emotionally abused: Not on file  Physically abused: Not on file    Forced sexual activity: Not on file  Other Topics Concern  . Not on file  Social History Narrative  . Not on file    FAMILY HISTORY:  Family History  Problem Relation Age of Onset  . Dementia Mother   . Dementia Maternal Aunt   . Dementia Maternal Grandmother   . COPD Maternal Grandfather   . Breast cancer Maternal Aunt 42  . Breast cancer Maternal Aunt        dx in her 29s  . Lung cancer Maternal Aunt   . Healthy Son   . Colon cancer Neg Hx     CURRENT MEDICATIONS:  Outpatient Encounter Medications as of 08/11/2018  Medication Sig  . Ascorbic Acid (VITAMIN C) 1000 MG tablet Take 1,000 mg by mouth daily.  Marland Kitchen esomeprazole (NEXIUM) 40 MG capsule Take 40 mg by mouth daily.   . fluorouracil CALGB 35701 in sodium chloride 0.9 % 150 mL Inject 6,150 mg into the vein every 14 (fourteen) days. Over 46 hours  . LEUCOVORIN CALCIUM IV Inject 876 mg into the vein every 14 (fourteen) days.  Marland Kitchen lidocaine  (XYLOCAINE) 2 % solution Use as directed 15 mLs in the mouth or throat 2 (two) times daily.  Marland Kitchen lidocaine-prilocaine (EMLA) cream Apply small amount to port site one hour prior to appointment. Cover with plastic wrap  . lisinopril (PRINIVIL,ZESTRIL) 10 MG tablet Take 10 mg by mouth daily.   . Magnesium 500 MG CAPS Take 500 mg by mouth daily.   . Multiple Vitamin (MULTIVITAMIN) tablet Take 1 tablet by mouth daily.  . ondansetron (ZOFRAN) 8 MG tablet Take 1 tablet (8 mg total) by mouth every 8 (eight) hours as needed for nausea or vomiting (may start taking as needed two days after chemotherapy).  . OXALIPLATIN IV Inject 185 mg into the vein every 14 (fourteen) days.  . Potassium (POTASSIMIN PO) Take 1 tablet by mouth daily.   . pravastatin (PRAVACHOL) 20 MG tablet Take 20 mg by mouth daily.   . promethazine (PHENERGAN) 25 MG tablet Take 1 tablet (25 mg total) by mouth every 6 (six) hours as needed for nausea or vomiting.  . topiramate (TOPAMAX) 100 MG tablet Take 100 mg by mouth 2 (two) times daily.    No facility-administered encounter medications on file as of 08/11/2018.     ALLERGIES:  No Known Allergies   PHYSICAL EXAM:  ECOG Performance status: 1  Vitals:   08/11/18 0750  BP: 130/78  Pulse: 93  Resp: 18  Temp: 98.3 F (36.8 C)  SpO2: 99%   Filed Weights   08/11/18 0750  Weight: 217 lb 9.6 oz (98.7 kg)    Physical Exam Vitals signs reviewed.  Constitutional:      Appearance: Normal appearance.  Cardiovascular:     Rate and Rhythm: Normal rate and regular rhythm.     Heart sounds: Normal heart sounds.  Pulmonary:     Effort: Pulmonary effort is normal.     Breath sounds: Normal breath sounds.  Abdominal:     General: There is no distension.     Palpations: Abdomen is soft. There is no mass.  Musculoskeletal:        General: No swelling.  Skin:    General: Skin is warm.  Neurological:     General: No focal deficit present.     Mental Status: He is alert and  oriented to person, place, and time.  Psychiatric:  Mood and Affect: Mood normal.        Behavior: Behavior normal.      LABORATORY DATA:  I have reviewed the labs as listed.  CBC    Component Value Date/Time   WBC 4.3 08/11/2018 0759   RBC 4.13 (L) 08/11/2018 0759   HGB 13.1 08/11/2018 0759   HCT 37.9 (L) 08/11/2018 0759   PLT 113 (L) 08/11/2018 0759   MCV 91.8 08/11/2018 0759   MCH 31.7 08/11/2018 0759   MCHC 34.6 08/11/2018 0759   RDW 13.7 08/11/2018 0759   LYMPHSABS 1.2 08/11/2018 0759   MONOABS 0.4 08/11/2018 0759   EOSABS 0.1 08/11/2018 0759   BASOSABS 0.0 08/11/2018 0759   CMP Latest Ref Rng & Units 07/28/2018 07/13/2018 06/30/2018  Glucose 70 - 99 mg/dL 478(H) 304(H) 315(H)  BUN 6 - 20 mg/dL _0 Creatinine 0.61 - 1.24 mg/dL 1.13 0.98 0.86  Sodium 135 - 145 mmol/L 135 136 138  Potassium 3.5 - 5.1 mmol/L 4.1 3.9 3.8  Chloride 98 - 111 mmol/L 105 106 109  CO2 22 - 32 mmol/L 21(L) 21(L) 22  Calcium 8.9 - 10.3 mg/dL 8.8(L) 8.7(L) 8.8(L)  Total Protein 6.5 - 8.1 g/dL 6.6 6.3(L) 6.4(L)  Total Bilirubin 0.3 - 1.2 mg/dL 0.8 0.6 0.5  Alkaline Phos 38 - 126 U/L 71 81 63  AST 15 - 41 U/L 30 37 34  ALT 0 - 44 U/L 62(H) 75(H) 57(H)       DIAGNOSTIC IMAGING:  I have independently reviewed the scans and discussed with the patient.      ASSESSMENT & PLAN:   Malignant neoplasm of sigmoid colon (HCC) 1.  Stage III (pT3 pN2b) sigmoid colon adenocarcinoma, MSI-stable: - Patient had left lower quadrant abdominal pain since October, CT scan done at [facility] of the abdomen pelvis showed sigmoid colon thickening. -Colonoscopy on 03/06/2018 showing a fungating infiltrative and polypoid partially obstructing large mass in the sigmoid colon between 20 to 25 cm from the anal verge.  This was consistent with colon cancer.  - Sigmoid colon segmental resection 03/02/2018 by Dr. Arnoldo Morale. This showed invasive adenocarcinoma, well-differentiated, 3.8 cm, tumor invades through  muscularis propria, resection margins negative, 14/35 lymph nodes positive, satellite nodules x3, PT 3, PN2B, MMR preserved. - CT of the chest on 02/26/2018 shows 2 small pulmonary nodules, 7 x 5 mm subpleural nodule in the right major fissure and the 4 mm left lower lobe nodule.  Mild hepatic steatosis was seen. - PET/CT scan on 03/30/2018 shows bilateral hypermetabolic pelvic sidewall lymph nodes, subcentimeter, not in the initial distribution pattern for sigmoid primary.  Lymph nodes are in the bilateral external iliac and right inguinal lymph node (7 mm).  They are most likely reactive.  A pelvic CT in 3 months is recommended.  Given normal CEA level, they are more likely reactive. -7 cycles of FOLFOX from 04/21/2018 through 07/13/2018. -He is having cold sensitivity after each cycle lasting about 4 to 5 days. -He thinks Ativan works better for nausea.  I will send him a prescription for Ativan 0.5 mg twice daily as needed. -We held his last chemotherapy on 07/28/2018 secondary to Danube count being low at 1400.  He had very good 2 weeks.  I have reviewed his CBC.  He may proceed with cycle 8 of chemotherapy today. -We will see him back in 2 weeks for follow-up.  2.  Family history: -2 maternal aunts had breast cancer.  No family history of colorectal cancer. -  I have recommended genetic testing given his young age at diagnosis.       Orders placed this encounter:  No orders of the defined types were placed in this encounter.     Derek Jack, MD [place] [phone]

## 2018-08-11 NOTE — Progress Notes (Signed)
[id number] Labs reviewed with and pt seen by Dr. Delton Coombes today and pt approved for chemo tx today per MD                                                                               Joel Jefferson. tolerated chemo tx well without complaints or incident. Pt discharged with 5FU pump infusing without issues. VSS upon discharge. Pt discharged self ambulatory in satisfactory condition

## 2018-08-11 NOTE — Patient Instructions (Signed)
[facility] at [facility] Discharge Instructions  Labs drawn from portacath today   Thank you for choosing [facility] at [facility] to provide your oncology and hematology care.  To afford each patient quality time with our provider, please arrive at least 15 minutes before your scheduled appointment time.   If you have a lab appointment with the Cancer Center please come in thru the  Main Entrance and check in at the main information desk  You need to re-schedule your appointment should you arrive 10 or more minutes late.  We strive to give you quality time with our providers, and arriving late affects you and other patients whose appointments are after yours.  Also, if you no show three or more times for appointments you may be dismissed from the clinic at the providers discretion.     Again, thank you for choosing [facility].  Our hope is that these requests will decrease the amount of time that you wait before being seen by our physicians.       _____________________________________________________________  Should you have questions after your visit to [facility], please contact our office at [phone] between the hours of 8:00 a.m. and 4:30 p.m.  Voicemails left after 4:00 p.m. will not be returned until the following business day.  For prescription refill requests, have your pharmacy contact our office and allow 72 hours.    Cancer Center Support Programs:   > Cancer Support Group  2nd Tuesday of the month 1pm-2pm, Journey Room   

## 2018-08-13 ENCOUNTER — Encounter (HOSPITAL_COMMUNITY): Payer: Self-pay

## 2018-08-13 ENCOUNTER — Other Ambulatory Visit (HOSPITAL_COMMUNITY): Payer: Self-pay | Admitting: Hematology

## 2018-08-13 ENCOUNTER — Telehealth (HOSPITAL_COMMUNITY): Payer: Self-pay

## 2018-08-13 ENCOUNTER — Inpatient Hospital Stay (HOSPITAL_COMMUNITY): Payer: BC Managed Care – PPO

## 2018-08-13 ENCOUNTER — Other Ambulatory Visit: Payer: Self-pay

## 2018-08-13 VITALS — BP 133/85 | HR 80 | Temp 98.1°F | Resp 18

## 2018-08-13 DIAGNOSIS — Z79899 Other long term (current) drug therapy: Secondary | ICD-10-CM | POA: Diagnosis not present

## 2018-08-13 DIAGNOSIS — Z87891 Personal history of nicotine dependence: Secondary | ICD-10-CM | POA: Diagnosis not present

## 2018-08-13 DIAGNOSIS — C187 Malignant neoplasm of sigmoid colon: Secondary | ICD-10-CM | POA: Diagnosis not present

## 2018-08-13 DIAGNOSIS — Z7984 Long term (current) use of oral hypoglycemic drugs: Secondary | ICD-10-CM | POA: Diagnosis not present

## 2018-08-13 DIAGNOSIS — R918 Other nonspecific abnormal finding of lung field: Secondary | ICD-10-CM | POA: Diagnosis not present

## 2018-08-13 DIAGNOSIS — I10 Essential (primary) hypertension: Secondary | ICD-10-CM | POA: Diagnosis not present

## 2018-08-13 DIAGNOSIS — Z803 Family history of malignant neoplasm of breast: Secondary | ICD-10-CM | POA: Diagnosis not present

## 2018-08-13 DIAGNOSIS — Z5111 Encounter for antineoplastic chemotherapy: Secondary | ICD-10-CM | POA: Diagnosis not present

## 2018-08-13 DIAGNOSIS — E119 Type 2 diabetes mellitus without complications: Secondary | ICD-10-CM | POA: Diagnosis not present

## 2018-08-13 MED ORDER — GLIPIZIDE ER 5 MG PO TB24: 5.0000 mg | ORAL_TABLET | Freq: Every day | ORAL | 3 refills | Status: DC

## 2018-08-13 MED ORDER — HEPARIN SOD (PORK) LOCK FLUSH 100 UNIT/ML IV SOLN
500.0000 [IU] | Freq: Once | INTRAVENOUS | Status: AC | PRN
  Administered 2018-08-13: 500 [IU]

## 2018-08-13 MED ORDER — SODIUM CHLORIDE 0.9% FLUSH
10.0000 mL | INTRAVENOUS | Status: DC | PRN
  Administered 2018-08-13: 10 mL
  Filled 2018-08-13: qty 10

## 2018-08-13 NOTE — Telephone Encounter (Signed)
Pt was notified that his Hgb A1C is 8.2 and Dr. Delton Coombes would like for pt to call his PCP to be prescribed something for his Diabetes. Pt does not want to do that yet. He has not been following his diet and has eaten a lot of carbs and sugary juices/drinks recently so he wants to be more careful with his diet and recheck his glucose when he returns to clinic in 2 weeks. Dr. Delton Coombes informed of this.

## 2018-08-13 NOTE — Progress Notes (Signed)
Pt's A1C: 8.2. BS average 300s. Advised Joel Jefferson to communicate with patient regarding results. We recommend patient be started on Glipizide XL 5 mg daily. Advised he will need to follow up with his PCP in 1 week for diabetic teaching and equipment- glucometer.

## 2018-08-13 NOTE — Progress Notes (Signed)
Joel Jefferson. returns today for port de access and flush after 46 hr continous infusion of 50fu. Tolerated infusion without problems. Portacath located left chest wall was  deaccessed and flushed with 49ml NS and 500U/33ml Heparin and needle removed intact.  Procedure without incident. Patient tolerated procedure well.   Vitals stable and discharged home from clinic ambulatory. Follow up as scheduled.

## 2018-08-20 DIAGNOSIS — C187 Malignant neoplasm of sigmoid colon: Secondary | ICD-10-CM | POA: Diagnosis not present

## 2018-08-25 ENCOUNTER — Other Ambulatory Visit: Payer: Self-pay

## 2018-08-25 ENCOUNTER — Encounter (HOSPITAL_COMMUNITY): Payer: Self-pay

## 2018-08-25 ENCOUNTER — Encounter (HOSPITAL_COMMUNITY): Payer: Self-pay | Admitting: Hematology

## 2018-08-25 ENCOUNTER — Inpatient Hospital Stay (HOSPITAL_COMMUNITY): Payer: BC Managed Care – PPO

## 2018-08-25 ENCOUNTER — Inpatient Hospital Stay (HOSPITAL_BASED_OUTPATIENT_CLINIC_OR_DEPARTMENT_OTHER): Payer: BC Managed Care – PPO | Admitting: Hematology

## 2018-08-25 VITALS — BP 130/86 | HR 77 | Temp 98.0°F | Resp 18

## 2018-08-25 DIAGNOSIS — I10 Essential (primary) hypertension: Secondary | ICD-10-CM

## 2018-08-25 DIAGNOSIS — C187 Malignant neoplasm of sigmoid colon: Secondary | ICD-10-CM | POA: Diagnosis not present

## 2018-08-25 DIAGNOSIS — Z79899 Other long term (current) drug therapy: Secondary | ICD-10-CM

## 2018-08-25 DIAGNOSIS — R918 Other nonspecific abnormal finding of lung field: Secondary | ICD-10-CM | POA: Diagnosis not present

## 2018-08-25 DIAGNOSIS — Z7984 Long term (current) use of oral hypoglycemic drugs: Secondary | ICD-10-CM | POA: Diagnosis not present

## 2018-08-25 DIAGNOSIS — Z87891 Personal history of nicotine dependence: Secondary | ICD-10-CM | POA: Diagnosis not present

## 2018-08-25 DIAGNOSIS — Z5111 Encounter for antineoplastic chemotherapy: Secondary | ICD-10-CM | POA: Diagnosis not present

## 2018-08-25 DIAGNOSIS — E119 Type 2 diabetes mellitus without complications: Secondary | ICD-10-CM

## 2018-08-25 DIAGNOSIS — Z803 Family history of malignant neoplasm of breast: Secondary | ICD-10-CM

## 2018-08-25 LAB — CBC WITH DIFFERENTIAL/PLATELET
Abs Immature Granulocytes: 0.01 10*3/uL (ref 0.00–0.07)
Basophils Absolute: 0 10*3/uL (ref 0.0–0.1)
Basophils Relative: 1 %
Eosinophils Absolute: 0.1 10*3/uL (ref 0.0–0.5)
Eosinophils Relative: 3 %
HCT: 40.2 % (ref 39.0–52.0)
Hemoglobin: 13.6 g/dL (ref 13.0–17.0)
Immature Granulocytes: 0 %
Lymphocytes Relative: 31 %
Lymphs Abs: 1.2 10*3/uL (ref 0.7–4.0)
MCH: 30.8 pg (ref 26.0–34.0)
MCHC: 33.8 g/dL (ref 30.0–36.0)
MCV: 91.2 fL (ref 80.0–100.0)
Monocytes Absolute: 0.3 10*3/uL (ref 0.1–1.0)
Monocytes Relative: 8 %
Neutro Abs: 2.2 10*3/uL (ref 1.7–7.7)
Neutrophils Relative %: 57 %
Platelets: 127 10*3/uL — ABNORMAL LOW (ref 150–400)
RBC: 4.41 MIL/uL (ref 4.22–5.81)
RDW: 13.1 % (ref 11.5–15.5)
WBC: 3.8 10*3/uL — ABNORMAL LOW (ref 4.0–10.5)
nRBC: 0 % (ref 0.0–0.2)

## 2018-08-25 LAB — COMPREHENSIVE METABOLIC PANEL
ALT: 62 U/L — ABNORMAL HIGH (ref 0–44)
AST: 28 U/L (ref 15–41)
Albumin: 3.9 g/dL (ref 3.5–5.0)
Alkaline Phosphatase: 67 U/L (ref 38–126)
Anion gap: 8 (ref 5–15)
BUN: 17 mg/dL (ref 6–20)
CO2: 22 mmol/L (ref 22–32)
Calcium: 9.2 mg/dL (ref 8.9–10.3)
Chloride: 109 mmol/L (ref 98–111)
Creatinine, Ser: 0.82 mg/dL (ref 0.61–1.24)
GFR calc Af Amer: >60 mL/min (ref >60)
GFR calc non Af Amer: >60 mL/min (ref >60)
Glucose, Bld: 226 mg/dL — ABNORMAL HIGH (ref 70–99)
Potassium: 4 mmol/L (ref 3.5–5.1)
Sodium: 139 mmol/L (ref 135–145)
Total Bilirubin: 0.4 mg/dL (ref 0.3–1.2)
Total Protein: 6.2 g/dL — ABNORMAL LOW (ref 6.5–8.1)

## 2018-08-25 LAB — MAGNESIUM: Magnesium: 2 mg/dL (ref 1.7–2.4)

## 2018-08-25 MED ORDER — SODIUM CHLORIDE 0.9 % IV SOLN
2400.0000 mg/m2 | INTRAVENOUS | Status: DC
  Administered 2018-08-25: 5250 mg via INTRAVENOUS
  Filled 2018-08-25: qty 105

## 2018-08-25 MED ORDER — OXALIPLATIN CHEMO INJECTION 100 MG/20ML
85.0000 mg/m2 | Freq: Once | INTRAVENOUS | Status: AC
  Administered 2018-08-25: 185 mg via INTRAVENOUS
  Filled 2018-08-25: qty 37

## 2018-08-25 MED ORDER — PALONOSETRON HCL INJECTION 0.25 MG/5ML
0.2500 mg | Freq: Once | INTRAVENOUS | Status: AC
  Administered 2018-08-25: 0.25 mg via INTRAVENOUS

## 2018-08-25 MED ORDER — LANREOTIDE ACETATE 120 MG/0.5ML ~~LOC~~ SOLN
SUBCUTANEOUS | Status: AC
  Filled 2018-08-25: qty 120

## 2018-08-25 MED ORDER — DEXTROSE 5 % IV SOLN
Freq: Once | INTRAVENOUS | Status: AC
  Administered 2018-08-25: 10:00:00 via INTRAVENOUS

## 2018-08-25 MED ORDER — LEUCOVORIN CALCIUM INJECTION 350 MG
411.0000 mg/m2 | Freq: Once | INTRAVENOUS | Status: AC
  Administered 2018-08-25: 900 mg via INTRAVENOUS
  Filled 2018-08-25: qty 45

## 2018-08-25 MED ORDER — LORAZEPAM 2 MG/ML IJ SOLN
1.0000 mg | Freq: Once | INTRAMUSCULAR | Status: AC
  Administered 2018-08-25: 1 mg via INTRAVENOUS
  Filled 2018-08-25: qty 1

## 2018-08-25 MED ORDER — DEXTROSE 5 % IV SOLN
Freq: Once | INTRAVENOUS | Status: AC
  Administered 2018-08-25: 11:00:00 via INTRAVENOUS

## 2018-08-25 MED ORDER — FLUOROURACIL CHEMO INJECTION 2.5 GM/50ML
400.0000 mg/m2 | Freq: Once | INTRAVENOUS | Status: AC
  Administered 2018-08-25: 900 mg via INTRAVENOUS
  Filled 2018-08-25: qty 18

## 2018-08-25 MED ORDER — SODIUM CHLORIDE 0.9% FLUSH
10.0000 mL | INTRAVENOUS | Status: DC | PRN
  Administered 2018-08-25: 10 mL
  Filled 2018-08-25: qty 10

## 2018-08-25 MED ORDER — PALONOSETRON HCL INJECTION 0.25 MG/5ML
INTRAVENOUS | Status: AC
  Filled 2018-08-25: qty 5

## 2018-08-25 MED ORDER — SODIUM CHLORIDE 0.9 % IV SOLN
Freq: Once | INTRAVENOUS | Status: AC
  Administered 2018-08-25: 11:00:00 via INTRAVENOUS
  Filled 2018-08-25: qty 5

## 2018-08-25 NOTE — Patient Instructions (Signed)
[facility] Discharge Instructions for Patients Receiving Chemotherapy   Beginning January 23rd 2017 lab work for the Cancer Center will be done in the  Main lab at [facility] on 1st floor. If you have a lab appointment with the Cancer Center please come in thru the  Main Entrance and check in at the main information desk   Today you received the following chemotherapy agents Oxaliplatin,Leucovorin and 5FU. Follow-up as scheduled. Call clinic for any questions or concerns  To help prevent nausea and vomiting after your treatment, we encourage you to take your nausea medication   If you develop nausea and vomiting, or diarrhea that is not controlled by your medication, call the clinic.  The clinic phone number is [phone]. Office hours are Monday-Friday 8:30am-5:00pm.  BELOW ARE SYMPTOMS THAT SHOULD BE REPORTED IMMEDIATELY:  *FEVER GREATER THAN 101.0 F  *CHILLS WITH OR WITHOUT FEVER  NAUSEA AND VOMITING THAT IS NOT CONTROLLED WITH YOUR NAUSEA MEDICATION  *UNUSUAL SHORTNESS OF BREATH  *UNUSUAL BRUISING OR BLEEDING  TENDERNESS IN MOUTH AND THROAT WITH OR WITHOUT PRESENCE OF ULCERS  *URINARY PROBLEMS  *BOWEL PROBLEMS  UNUSUAL RASH Items with * indicate a potential emergency and should be followed up as soon as possible. If you have an emergency after office hours please contact your primary care physician or go to the nearest emergency department.  Please call the clinic during office hours if you have any questions or concerns.   You may also contact the Patient Navigator at [phone] should you have any questions or need assistance in obtaining follow up care.      Resources For Cancer Patients and their Caregivers ? American Cancer Society: Can assist with transportation, wigs, general needs, runs Look Good Feel Better.        1-888-227-6333 ? Cancer Care: Provides financial assistance, online support groups, medication/co-pay assistance.   1-800-813-HOPE (4673) ? [facility] Assists [place] cancer patients and their families through emotional , educational and financial support.  336-427-4357 ? [place] DSS Where to apply for food stamps, Medicaid and utility assistance. 336-342-1394 ? [facility]: Transportation to medical appointments. 336-347-2287 ? Social Security Administration: May apply for disability if have a Stage IV cancer. 336-342-7796 1-800-772-1213 ? [place] Aging, Disability and Transit Services: Assists with nutrition, care and transit needs. 336-349-2343         

## 2018-08-25 NOTE — Patient Instructions (Signed)
[facility] at [facility] Discharge Instructions  You were seen today by Dr. Katragadda. He went over your recent lab results. He will see you back in 2 weeks for labs and follow up.   Thank you for choosing [facility] at [facility] to provide your oncology and hematology care.  To afford each patient quality time with our provider, please arrive at least 15 minutes before your scheduled appointment time.   If you have a lab appointment with the Cancer Center please come in thru the  Main Entrance and check in at the main information desk  You need to re-schedule your appointment should you arrive 10 or more minutes late.  We strive to give you quality time with our providers, and arriving late affects you and other patients whose appointments are after yours.  Also, if you no show three or more times for appointments you may be dismissed from the clinic at the providers discretion.     Again, thank you for choosing [facility].  Our hope is that these requests will decrease the amount of time that you wait before being seen by our physicians.       _____________________________________________________________  Should you have questions after your visit to [facility], please contact our office at [phone] between the hours of 8:00 a.m. and 4:30 p.m.  Voicemails left after 4:00 p.m. will not be returned until the following business day.  For prescription refill requests, have your pharmacy contact our office and allow 72 hours.    Cancer Center Support Programs:   > Cancer Support Group  2nd Tuesday of the month 1pm-2pm, Journey Room    

## 2018-08-25 NOTE — Patient Instructions (Signed)
[facility] at [facility] Discharge Instructions  Labs drawn from portacath today   Thank you for choosing [facility] at [facility] to provide your oncology and hematology care.  To afford each patient quality time with our provider, please arrive at least 15 minutes before your scheduled appointment time.   If you have a lab appointment with the Cancer Center please come in thru the  Main Entrance and check in at the main information desk  You need to re-schedule your appointment should you arrive 10 or more minutes late.  We strive to give you quality time with our providers, and arriving late affects you and other patients whose appointments are after yours.  Also, if you no show three or more times for appointments you may be dismissed from the clinic at the providers discretion.     Again, thank you for choosing [facility].  Our hope is that these requests will decrease the amount of time that you wait before being seen by our physicians.       _____________________________________________________________  Should you have questions after your visit to [facility], please contact our office at [phone] between the hours of 8:00 a.m. and 4:30 p.m.  Voicemails left after 4:00 p.m. will not be returned until the following business day.  For prescription refill requests, have your pharmacy contact our office and allow 72 hours.    Cancer Center Support Programs:   > Cancer Support Group  2nd Tuesday of the month 1pm-2pm, Journey Room   

## 2018-08-25 NOTE — Progress Notes (Signed)
[place] reviewed with and pt seen by Dr. Delton Coombes and pt approved for chemo tx today per MD                                                                                     Joel Jefferson. tolerated chemo tx well without complaints or incident. Pt discharged with 5FU pump infusing without issues. VSS upon discharge. Pt discharged self ambulatory in satisfactory condition

## 2018-08-25 NOTE — Assessment & Plan Note (Signed)
1.  Stage III (pT3 pN2b) sigmoid colon adenocarcinoma, MSI-stable: - Patient had left lower quadrant abdominal pain since October, CT scan done at [facility] of the abdomen pelvis showed sigmoid colon thickening. -Colonoscopy on 03/06/2018 showing a fungating infiltrative and polypoid partially obstructing large mass in the sigmoid colon between 20 to 25 cm from the anal verge.  This was consistent with colon cancer.  - Sigmoid colon segmental resection 03/02/2018 by Dr. Arnoldo Morale. This showed invasive adenocarcinoma, well-differentiated, 3.8 cm, tumor invades through muscularis propria, resection margins negative, 14/35 lymph nodes positive, satellite nodules x3, PT 3, PN2B, MMR preserved. - CT of the chest on 02/26/2018 shows 2 small pulmonary nodules, 7 x 5 mm subpleural nodule in the right major fissure and the 4 mm left lower lobe nodule.  Mild hepatic steatosis was seen. - PET/CT scan on 03/30/2018 shows bilateral hypermetabolic pelvic sidewall lymph nodes, subcentimeter, not in the initial distribution pattern for sigmoid primary.  Lymph nodes are in the bilateral external iliac and right inguinal lymph node (7 mm).  They are most likely reactive.  A pelvic CT in 3 months is recommended.  Given normal CEA level, they are more likely reactive. -8 cycles of FOLFOX from 04/21/2018 through 08/11/2018.   -He has minor cold sensitivity.  He also had aphthous ulcers in the mouth.  Denies any tingling or numbness in extremities. -He did not have any GI side effects including  vomiting or diarrhea.  He had mild nausea which was well controlled with Ativan. -We reviewed his blood work.  He may proceed with cycle 9 today.  No dose modifications needed. -We will see him back in 2 weeks for follow-up.  2.  Diabetes mellitus: - As his blood sugars were running high, we checked his hemoglobin A1c which was 8.9. - At last visit we started him on glipizide 5 mg daily.  His blood sugars are much better. -We will  prescribe him a glucometer strips.  He was told to check his fasting and postprandial sugars and report back to [place] next visit.  I have counseled him to cut back on high sugar foods.  3.  Family history: -2 maternal aunts had breast cancer.  No family history of colorectal cancer. -I have recommended genetic testing given his young age at diagnosis.

## 2018-08-25 NOTE — Progress Notes (Signed)
Joel Jefferson, [place] [address]   CLINIC:  Medical Oncology/Hematology  PCP:  Sharilyn Sites, [place] [address] [phone]   REASON FOR VISIT:  Follow-up for stage III colon cancer  CURRENT THERAPY:Folfox every 2 weeks   BRIEF ONCOLOGIC HISTORY:    Malignant neoplasm of sigmoid colon [facility])    Initial Diagnosis    Cancer of sigmoid colon ([place])    03/27/2018 Genetic Testing    ALK c.350C>G VUS identified on the multicancer panel.  The Multi-Gene Panel offered by [facility] includes sequencing and/or deletion duplication testing of the following 85 genes: AIP, ALK, APC, ATM, AXIN2,BAP1,  BARD1, BLM, BMPR1A, BRCA1, BRCA2, BRIP1, CASR, CDC73, CDH1, CDK4, CDKN1B, CDKN1C, CDKN2A (p14ARF), CDKN2A (p16INK4a), CEBPA, CHEK2, CTNNA1, DICER1, DIS3L2, EGFR (c.2369C>T, p.Thr790Met variant only), EPCAM (Deletion/duplication testing only), FH, FLCN, GATA2, GPC3, GREM1 (Promoter region deletion/duplication testing only), HOXB13 (c.251G>A, p.Gly84Glu), HRAS, KIT, MAX, MEN1, MET, MITF (c.952G>A, p.Glu318Lys variant only), MLH1, MSH2, MSH3, MSH6, MUTYH, NBN, NF1, NF2, NTHL1, PALB2, PDGFRA, PHOX2B, PMS2, POLD1, POLE, POT1, PRKAR1A, PTCH1, PTEN, RAD50, RAD51C, RAD51D, RB1, RECQL4, RET, RNF43, RUNX1, SDHAF2, SDHA (sequence changes only), SDHB, SDHC, SDHD, SMAD4, SMARCA4, SMARCB1, SMARCE1, STK11, SUFU, TERC, TERT, TMEM127, TP53, TSC1, TSC2, VHL, WRN and WT1.   The report date is 03/27/2018.    04/21/2018 -  Chemotherapy    The patient had palonosetron (ALOXI) injection 0.25 mg, 0.25 mg, Intravenous,  Once, 10 of 12 cycles Administration: 0.25 mg (04/21/2018), 0.25 mg (05/05/2018), 0.25 mg (05/19/2018), 0.25 mg (06/02/2018), 0.25 mg (06/17/2018), 0.25 mg (06/30/2018), 0.25 mg (07/13/2018), 0.25 mg (08/11/2018) leucovorin 800 mg in dextrose 5 % 250 mL infusion, 876 mg, Intravenous,  Once, 10 of 12 cycles Administration: 800 mg (04/21/2018), 900 mg (05/05/2018),  800 mg (05/19/2018), 800 mg (06/02/2018), 900 mg (06/17/2018), 900 mg (06/30/2018), 900 mg (07/13/2018), 900 mg (08/11/2018) oxaliplatin (ELOXATIN) 185 mg in dextrose 5 % 500 mL chemo infusion, 85 mg/m2 = 185 mg, Intravenous,  Once, 10 of 12 cycles Administration: 185 mg (04/21/2018), 185 mg (05/05/2018), 185 mg (05/19/2018), 185 mg (06/02/2018), 185 mg (06/17/2018), 185 mg (06/30/2018), 185 mg (07/13/2018), 185 mg (08/11/2018) fluorouracil (ADRUCIL) chemo injection 900 mg, 400 mg/m2 = 900 mg, Intravenous,  Once, 10 of 12 cycles Administration: 900 mg (04/21/2018), 900 mg (05/05/2018), 900 mg (05/19/2018), 900 mg (06/02/2018), 900 mg (06/17/2018), 900 mg (06/30/2018), 900 mg (07/13/2018), 900 mg (08/11/2018) fosaprepitant (EMEND) 150 mg, dexamethasone (DECADRON) 12 mg in sodium chloride 0.9 % 145 mL IVPB, , Intravenous,  Once, 7 of 9 cycles Administration:  (06/02/2018),  (06/17/2018),  (06/30/2018),  (07/13/2018),  (08/11/2018) fluorouracil (ADRUCIL) 5,250 mg in sodium chloride 0.9 % 145 mL chemo infusion, 2,400 mg/m2 = 5,250 mg, Intravenous, 1 Day/Dose, 10 of 12 cycles Administration: 5,250 mg (04/21/2018), 5,250 mg (05/05/2018), 5,250 mg (05/19/2018), 5,250 mg (06/02/2018), 5,250 mg (06/17/2018), 5,250 mg (06/30/2018), 5,250 mg (07/13/2018), 5,250 mg (08/11/2018)  for chemotherapy treatment.       CANCER STAGING: Cancer Staging No matching staging information was found for the patient.   INTERVAL HISTORY:  Joel Jefferson 43 y.o. male returns for follow-up and cycle 9 of chemotherapy.  Last chemo was on 08/11/2018.  He had mild nausea which was controlled with Ativan.  Had some minor cold sensitivity but denies any persisting tingling or numbness in the extremities.  He started taking glipizide 5 mg with breakfast.  He denies any lightheadedness.  He requests prescription for glucometer strips.  He does report  having couple of mouth sores, 1 started last week.  Appetite is 100%.  Energy levels are 75%.    REVIEW OF SYSTEMS:  Review of  Systems  HENT:   Positive for mouth sores.   Gastrointestinal: Positive for nausea. Negative for constipation and diarrhea.  Neurological: Positive for numbness.  All other systems reviewed and are negative.    PAST MEDICAL/SURGICAL HISTORY:  Past Medical History:  Diagnosis Date  . Family history of breast cancer   . GERD (gastroesophageal reflux disease)   . History of kidney stones   . Hypercholesterolemia   . Hypertension   . Urethral stricture    Past Surgical History:  Procedure Laterality Date  . BALLOON DILATION  04/06/2012   Procedure: BALLOON DILATION;  Surgeon: Reece Packer, MD;  Location: [facility];  Service: Urology;  Laterality: N/A;  . BIOPSY  02/24/2018   Procedure: BIOPSY;  Surgeon: Danie Binder, MD;  [facility];  Service: Endoscopy;;  colon  . COLONOSCOPY WITH PROPOFOL N/A 02/24/2018   Procedure: COLONOSCOPY WITH PROPOFOL;  Surgeon: Danie Binder, MD;  Location: [facility];  Service: Endoscopy;  Laterality: N/A;  9:30am  . ESOPHAGOGASTRODUODENOSCOPY (EGD) WITH PROPOFOL N/A 08/29/2015   Dr. Oneida Alar: possible proximal esophageal web s/p dilation, moderate gastritis, negative eosinophilic esophagitis   . PARTIAL COLECTOMY N/A 03/02/2018   Procedure: PARTIAL COLECTOMY;  Surgeon: Aviva Signs, MD;  Location: [facility];  Service: General;  Laterality: N/A;  . PORTACATH PLACEMENT Left 04/13/2018   Procedure: INSERTION PORT-A-CATH;  Surgeon: Aviva Signs, MD;  Location: [facility];  Service: General;  Laterality: Left;  . SAVORY DILATION N/A 08/29/2015   Procedure: SAVORY DILATION;  Surgeon: Danie Binder, MD;  Location[facility];  Service: Endoscopy;  Laterality: N/A;  . URETHRAL DILATION    . WISDOM TOOTH EXTRACTION       SOCIAL HISTORY:  Social History   Socioeconomic History  . Marital status: Divorced    Spouse name: Not on file  . Number of children: Not on file  . Years of education: Not on file  . Highest  education level: Not on file  Occupational History  . Not on file  Social Needs  . Financial resource strain: Not on file  . Food insecurity:    Worry: Not on file    Inability: Not on file  . Transportation needs:    Medical: Not on file    Non-medical: Not on file  Tobacco Use  . Smoking status: Former Smoker    Packs/day: 0.25    Years: 9.00    Pack years: 2.25    Types: Cigarettes    Last attempt to quit: 07/27/2011    Years since quitting: 7.0  . Smokeless tobacco: Never Used  Substance and Sexual Activity  . Alcohol use: Yes    Alcohol/week: 6.0 standard drinks    Types: 6 Cans of beer per week  . Drug use: No  . Sexual activity: Yes    Birth control/protection: None  Lifestyle  . Physical activity:    Days per week: Not on file    Minutes per session: Not on file  . Stress: Not on file  Relationships  . Social connections:    Talks on phone: Not on file    Gets together: Not on file    Attends religious service: Not on file    Active member of club or organization: Not on file    Attends meetings  of clubs or organizations: Not on file    Relationship status: Not on file  . Intimate partner violence:    Fear of current or ex partner: Not on file    Emotionally abused: Not on file    Physically abused: Not on file    Forced sexual activity: Not on file  Other Topics Concern  . Not on file  Social History Narrative  . Not on file    FAMILY HISTORY:  Family History  Problem Relation Age of Onset  . Dementia Mother   . Dementia Maternal Aunt   . Dementia Maternal Grandmother   . COPD Maternal Grandfather   . Breast cancer Maternal Aunt 42  . Breast cancer Maternal Aunt        dx in her 47s  . Lung cancer Maternal Aunt   . Healthy Son   . Colon cancer Neg Hx     CURRENT MEDICATIONS:  Outpatient Encounter Medications as of 08/25/2018  Medication Sig  . Ascorbic Acid (VITAMIN C) 1000 MG tablet Take 1,000 mg by mouth daily.  Marland Kitchen esomeprazole (NEXIUM) 40  MG capsule Take 40 mg by mouth daily.   . fluorouracil CALGB 94765 in sodium chloride 0.9 % 150 mL Inject 6,150 mg into the vein every 14 (fourteen) days. Over 46 hours  . glipiZIDE (GLIPIZIDE XL) 5 MG 24 hr tablet Take 1 tablet (5 mg total) by mouth daily with breakfast.  . LEUCOVORIN CALCIUM IV Inject 876 mg into the vein every 14 (fourteen) days.  Marland Kitchen lidocaine (XYLOCAINE) 2 % solution Use as directed 15 mLs in the mouth or throat 2 (two) times daily.  Marland Kitchen lidocaine-prilocaine (EMLA) cream Apply small amount to port site one hour prior to appointment. Cover with plastic wrap  . lisinopril (PRINIVIL,ZESTRIL) 10 MG tablet Take 10 mg by mouth daily.   Marland Kitchen LORazepam (ATIVAN) 0.5 MG tablet Take 1 tablet (0.5 mg total) by mouth every 8 (eight) hours.  . Magnesium 500 MG CAPS Take 500 mg by mouth daily.   . Multiple Vitamin (MULTIVITAMIN) tablet Take 1 tablet by mouth daily.  . ondansetron (ZOFRAN) 8 MG tablet Take 1 tablet (8 mg total) by mouth every 8 (eight) hours as needed for nausea or vomiting (may start taking as needed two days after chemotherapy).  . OXALIPLATIN IV Inject 185 mg into the vein every 14 (fourteen) days.  . Potassium (POTASSIMIN PO) Take 1 tablet by mouth daily.   . pravastatin (PRAVACHOL) 20 MG tablet Take 20 mg by mouth daily.   . promethazine (PHENERGAN) 25 MG tablet Take 1 tablet (25 mg total) by mouth every 6 (six) hours as needed for nausea or vomiting.  . topiramate (TOPAMAX) 100 MG tablet Take 100 mg by mouth 2 (two) times daily.    Facility-Administered Encounter Medications as of 08/25/2018  Medication  . [COMPLETED] dextrose 5 % solution  . dextrose 5 % solution  . fluorouracil (ADRUCIL) 5,250 mg in sodium chloride 0.9 % 145 mL chemo infusion  . fluorouracil (ADRUCIL) chemo injection 900 mg  . fosaprepitant (EMEND) 150 mg, dexamethasone (DECADRON) 12 mg in sodium chloride 0.9 % 145 mL IVPB  . leucovorin 876 mg in dextrose 5 % 250 mL infusion  . LORazepam (ATIVAN)  injection 1 mg  . oxaliplatin (ELOXATIN) 185 mg in dextrose 5 % 500 mL chemo infusion  . palonosetron (ALOXI) injection 0.25 mg  . sodium chloride flush (NS) 0.9 % injection 10 mL    ALLERGIES:  No Known Allergies  PHYSICAL EXAM:  ECOG Performance status: 1  Vitals:   08/25/18 0838  BP: 131/86  Pulse: 82  Resp: 18  Temp: 98.3 F (36.8 C)  SpO2: 100%   Filed Weights   08/25/18 0838  Weight: 215 lb 9.6 oz (97.8 kg)    Physical Exam Vitals signs reviewed.  Constitutional:      Appearance: Normal appearance.  Cardiovascular:     Rate and Rhythm: Normal rate and regular rhythm.     Heart sounds: Normal heart sounds.  Pulmonary:     Effort: Pulmonary effort is normal.     Breath sounds: Normal breath sounds.  Abdominal:     General: There is no distension.     Palpations: Abdomen is soft. There is no mass.  Musculoskeletal:        General: No swelling.  Skin:    General: Skin is warm.  Neurological:     General: No focal deficit present.     Mental Status: He is alert and oriented to person, place, and time.  Psychiatric:        Mood and Affect: Mood normal.        Behavior: Behavior normal.      LABORATORY DATA:  I have reviewed the labs as listed.  CBC    Component Value Date/Time   WBC 3.8 (L) 08/25/2018 0856   RBC 4.41 08/25/2018 0856   HGB 13.6 08/25/2018 0856   HCT 40.2 08/25/2018 0856   PLT 127 (L) 08/25/2018 0856   MCV 91.2 08/25/2018 0856   MCH 30.8 08/25/2018 0856   MCHC 33.8 08/25/2018 0856   RDW 13.1 08/25/2018 0856   LYMPHSABS 1.2 08/25/2018 0856   MONOABS 0.3 08/25/2018 0856   EOSABS 0.1 08/25/2018 0856   BASOSABS 0.0 08/25/2018 0856   CMP Latest Ref Rng & Units 08/25/2018 08/11/2018 07/28/2018  Glucose 70 - 99 mg/dL 226(H) 466(H) 478(H)  BUN 6 - 20 mg/dL _0 Creatinine 0.61 - 1.24 mg/dL 0.82 0.81 1.13  Sodium 135 - 145 mmol/L 139 136 135  Potassium 3.5 - 5.1 mmol/L 4.0 4.0 4.1  Chloride 98 - 111 mmol/L 109 105 105  CO2 22 -  32 mmol/L 22 22 21(L)  Calcium 8.9 - 10.3 mg/dL 9.2 8.6(L) 8.8(L)  Total Protein 6.5 - 8.1 g/dL 6.2(L) 6.1(L) 6.6  Total Bilirubin 0.3 - 1.2 mg/dL 0.4 0.7 0.8  Alkaline Phos 38 - 126 U/L 67 82 71  AST 15 - 41 U/L _1 ALT 0 - 44 U/L 62(H) 74(H) 62(H)       DIAGNOSTIC IMAGING:  I have independently reviewed the scans and discussed with the patient.      ASSESSMENT & PLAN:   Malignant neoplasm of sigmoid colon ([facility]) 1.  Stage III (pT3 pN2b) sigmoid colon adenocarcinoma, MSI-stable: - Patient had left lower quadrant abdominal pain since October, CT scan done at [facility] of the abdomen pelvis showed sigmoid colon thickening. -Colonoscopy on 03/06/2018 showing a fungating infiltrative and polypoid partially obstructing large mass in the sigmoid colon between 20 to 25 cm from the anal verge.  This was consistent with colon cancer.  - Sigmoid colon segmental resection 03/02/2018 by Dr. Arnoldo Morale. This showed invasive adenocarcinoma, well-differentiated, 3.8 cm, tumor invades through muscularis propria, resection margins negative, 14/35 lymph nodes positive, satellite nodules x3, PT 3, PN2B, MMR preserved. - CT of the chest on 02/26/2018 shows 2 small pulmonary nodules, 7 x 5 mm subpleural nodule in the right major fissure  and the 4 mm left lower lobe nodule.  Mild hepatic steatosis was seen. - PET/CT scan on 03/30/2018 shows bilateral hypermetabolic pelvic sidewall lymph nodes, subcentimeter, not in the initial distribution pattern for sigmoid primary.  Lymph nodes are in the bilateral external iliac and right inguinal lymph node (7 mm).  They are most likely reactive.  A pelvic CT in 3 months is recommended.  Given normal CEA level, they are more likely reactive. -8 cycles of FOLFOX from 04/21/2018 through 08/11/2018.   -He has minor cold sensitivity.  He also had aphthous ulcers in the mouth.  Denies any tingling or numbness in extremities. -He did not have any GI side effects including   vomiting or diarrhea.  He had mild nausea which was well controlled with Ativan. -We reviewed his blood work.  He may proceed with cycle 9 today.  No dose modifications needed. -We will see him back in 2 weeks for follow-up.  2.  Diabetes mellitus: - As his blood sugars were running high, we checked his hemoglobin A1c which was 8.9. - At last visit we started him on glipizide 5 mg daily.  His blood sugars are much better. -We will prescribe him a glucometer strips.  He was told to check his fasting and postprandial sugars and report back to [place] next visit.  I have counseled him to cut back on high sugar foods.  3.  Family history: -2 maternal aunts had breast cancer.  No family history of colorectal cancer. -I have recommended genetic testing given his young age at diagnosis.       Orders placed this encounter:  No orders of the defined types were placed in this encounter.     Derek Jack, MD [place] [phone]

## 2018-08-27 ENCOUNTER — Encounter (HOSPITAL_COMMUNITY): Payer: Self-pay

## 2018-08-27 ENCOUNTER — Inpatient Hospital Stay (HOSPITAL_COMMUNITY): Payer: BC Managed Care – PPO

## 2018-08-27 ENCOUNTER — Other Ambulatory Visit: Payer: Self-pay

## 2018-08-27 VITALS — BP 124/82 | HR 60 | Resp 16

## 2018-08-27 DIAGNOSIS — Z5111 Encounter for antineoplastic chemotherapy: Secondary | ICD-10-CM | POA: Diagnosis not present

## 2018-08-27 DIAGNOSIS — R918 Other nonspecific abnormal finding of lung field: Secondary | ICD-10-CM | POA: Diagnosis not present

## 2018-08-27 DIAGNOSIS — Z803 Family history of malignant neoplasm of breast: Secondary | ICD-10-CM | POA: Diagnosis not present

## 2018-08-27 DIAGNOSIS — C187 Malignant neoplasm of sigmoid colon: Secondary | ICD-10-CM

## 2018-08-27 DIAGNOSIS — Z7984 Long term (current) use of oral hypoglycemic drugs: Secondary | ICD-10-CM | POA: Diagnosis not present

## 2018-08-27 DIAGNOSIS — E119 Type 2 diabetes mellitus without complications: Secondary | ICD-10-CM | POA: Diagnosis not present

## 2018-08-27 DIAGNOSIS — I10 Essential (primary) hypertension: Secondary | ICD-10-CM | POA: Diagnosis not present

## 2018-08-27 DIAGNOSIS — Z87891 Personal history of nicotine dependence: Secondary | ICD-10-CM | POA: Diagnosis not present

## 2018-08-27 DIAGNOSIS — Z79899 Other long term (current) drug therapy: Secondary | ICD-10-CM | POA: Diagnosis not present

## 2018-08-27 MED ORDER — SODIUM CHLORIDE 0.9% FLUSH
10.0000 mL | INTRAVENOUS | Status: DC | PRN
  Administered 2018-08-27: 10 mL
  Filled 2018-08-27: qty 10

## 2018-08-27 MED ORDER — HEPARIN SOD (PORK) LOCK FLUSH 100 UNIT/ML IV SOLN
500.0000 [IU] | Freq: Once | INTRAVENOUS | Status: AC | PRN
  Administered 2018-08-27: 500 [IU]

## 2018-08-27 NOTE — Progress Notes (Signed)
Joel Jefferson. returns today for port de access and flush after 46 hr continous infusion of 86fu. Tolerated infusion without problems. Portacath located left chest wall was  deaccessed and flushed with 21ml NS and 500U/34ml Heparin and needle removed intact.  Procedure without incident. Patient tolerated procedure well.  Vitals stable and discharged home from clinic ambulatory. Follow up as scheduled.

## 2018-08-27 NOTE — Patient Instructions (Signed)
[facility]  Discharge Instructions:   _______________________________________________________________  Thank you for choosing [facility] at [facility] to provide your oncology and hematology care.  To afford each patient quality time with our providers, please arrive at least 15 minutes before your scheduled appointment.  You need to re-schedule your appointment if you arrive 10 or more minutes late.  We strive to give you quality time with our providers, and arriving late affects you and other patients whose appointments are after yours.  Also, if you no show three or more times for appointments you may be dismissed from the clinic.  Again, thank you for choosing [facility] at [facility]. Our hope is that these requests will allow you access to exceptional care and in a timely manner. _______________________________________________________________  If you have questions after your visit, please contact our office at [phone] between the hours of 8:30 a.m. and 5:00 p.m. Voicemails left after 4:30 p.m. will not be returned until the following business day. _______________________________________________________________  For prescription refill requests, have your pharmacy contact our office. _______________________________________________________________  Recommendations made by the consultant and any test results will be sent to your referring physician. _______________________________________________________________ 

## 2018-09-08 ENCOUNTER — Inpatient Hospital Stay (HOSPITAL_COMMUNITY): Payer: BC Managed Care – PPO

## 2018-09-08 ENCOUNTER — Inpatient Hospital Stay (HOSPITAL_BASED_OUTPATIENT_CLINIC_OR_DEPARTMENT_OTHER): Payer: BC Managed Care – PPO | Admitting: Nurse Practitioner

## 2018-09-08 ENCOUNTER — Other Ambulatory Visit: Payer: Self-pay

## 2018-09-08 ENCOUNTER — Inpatient Hospital Stay (HOSPITAL_COMMUNITY): Payer: BC Managed Care – PPO | Attending: Hematology

## 2018-09-08 VITALS — BP 141/80 | HR 78 | Temp 97.7°F | Resp 18

## 2018-09-08 DIAGNOSIS — Z452 Encounter for adjustment and management of vascular access device: Secondary | ICD-10-CM | POA: Diagnosis not present

## 2018-09-08 DIAGNOSIS — C187 Malignant neoplasm of sigmoid colon: Secondary | ICD-10-CM

## 2018-09-08 DIAGNOSIS — G629 Polyneuropathy, unspecified: Secondary | ICD-10-CM | POA: Diagnosis not present

## 2018-09-08 DIAGNOSIS — Z5111 Encounter for antineoplastic chemotherapy: Secondary | ICD-10-CM | POA: Insufficient documentation

## 2018-09-08 LAB — CBC WITH DIFFERENTIAL/PLATELET
Abs Immature Granulocytes: 0 10*3/uL (ref 0.00–0.07)
Basophils Absolute: 0 10*3/uL (ref 0.0–0.1)
Basophils Relative: 1 %
Eosinophils Absolute: 0.1 10*3/uL (ref 0.0–0.5)
Eosinophils Relative: 2 %
HCT: 38.9 % — ABNORMAL LOW (ref 39.0–52.0)
Hemoglobin: 13.3 g/dL (ref 13.0–17.0)
Immature Granulocytes: 0 %
Lymphocytes Relative: 39 %
Lymphs Abs: 1.4 10*3/uL (ref 0.7–4.0)
MCH: 31.4 pg (ref 26.0–34.0)
MCHC: 34.2 g/dL (ref 30.0–36.0)
MCV: 92 fL (ref 80.0–100.0)
Monocytes Absolute: 0.4 10*3/uL (ref 0.1–1.0)
Monocytes Relative: 12 %
Neutro Abs: 1.7 10*3/uL (ref 1.7–7.7)
Neutrophils Relative %: 46 %
Platelets: 114 10*3/uL — ABNORMAL LOW (ref 150–400)
RBC: 4.23 MIL/uL (ref 4.22–5.81)
RDW: 13.7 % (ref 11.5–15.5)
WBC: 3.6 10*3/uL — ABNORMAL LOW (ref 4.0–10.5)
nRBC: 0 % (ref 0.0–0.2)

## 2018-09-08 LAB — COMPREHENSIVE METABOLIC PANEL
ALT: 58 U/L — ABNORMAL HIGH (ref 0–44)
AST: 32 U/L (ref 15–41)
Albumin: 3.8 g/dL (ref 3.5–5.0)
Alkaline Phosphatase: 65 U/L (ref 38–126)
Anion gap: 7 (ref 5–15)
BUN: 12 mg/dL (ref 6–20)
CO2: 21 mmol/L — ABNORMAL LOW (ref 22–32)
Calcium: 8.9 mg/dL (ref 8.9–10.3)
Chloride: 112 mmol/L — ABNORMAL HIGH (ref 98–111)
Creatinine, Ser: 0.81 mg/dL (ref 0.61–1.24)
GFR calc Af Amer: >60 mL/min (ref >60)
GFR calc non Af Amer: >60 mL/min (ref >60)
Glucose, Bld: 204 mg/dL — ABNORMAL HIGH (ref 70–99)
Potassium: 4 mmol/L (ref 3.5–5.1)
Sodium: 140 mmol/L (ref 135–145)
Total Bilirubin: 0.4 mg/dL (ref 0.3–1.2)
Total Protein: 6.2 g/dL — ABNORMAL LOW (ref 6.5–8.1)

## 2018-09-08 MED ORDER — LEUCOVORIN CALCIUM INJECTION 350 MG: 400.0000 mg/m2 | Freq: Once | INTRAVENOUS | Status: DC

## 2018-09-08 MED ORDER — DEXTROSE 5 % IV SOLN
Freq: Once | INTRAVENOUS | Status: AC
  Administered 2018-09-08: 10:00:00 via INTRAVENOUS

## 2018-09-08 MED ORDER — LORAZEPAM 2 MG/ML IJ SOLN
1.0000 mg | Freq: Once | INTRAMUSCULAR | Status: AC
  Administered 2018-09-08: 1 mg via INTRAVENOUS
  Filled 2018-09-08: qty 1

## 2018-09-08 MED ORDER — LEUCOVORIN CALCIUM INJECTION 350 MG
411.0000 mg/m2 | Freq: Once | INTRAVENOUS | Status: AC
  Administered 2018-09-08: 900 mg via INTRAVENOUS
  Filled 2018-09-08: qty 45

## 2018-09-08 MED ORDER — FLUOROURACIL CHEMO INJECTION 2.5 GM/50ML
400.0000 mg/m2 | Freq: Once | INTRAVENOUS | Status: AC
  Administered 2018-09-08: 900 mg via INTRAVENOUS
  Filled 2018-09-08: qty 18

## 2018-09-08 MED ORDER — PALONOSETRON HCL INJECTION 0.25 MG/5ML
0.2500 mg | Freq: Once | INTRAVENOUS | Status: AC
  Administered 2018-09-08: 0.25 mg via INTRAVENOUS
  Filled 2018-09-08: qty 5

## 2018-09-08 MED ORDER — SODIUM CHLORIDE 0.9 % IV SOLN
2400.0000 mg/m2 | INTRAVENOUS | Status: DC
  Administered 2018-09-08: 5250 mg via INTRAVENOUS
  Filled 2018-09-08: qty 105

## 2018-09-08 MED ORDER — SODIUM CHLORIDE 0.9 % IV SOLN
Freq: Once | INTRAVENOUS | Status: AC
  Administered 2018-09-08: 10:00:00 via INTRAVENOUS
  Filled 2018-09-08: qty 5

## 2018-09-08 MED ORDER — SODIUM CHLORIDE 0.9% FLUSH
10.0000 mL | INTRAVENOUS | Status: DC | PRN
  Administered 2018-09-08: 10 mL
  Filled 2018-09-08: qty 10

## 2018-09-08 MED ORDER — OXALIPLATIN CHEMO INJECTION 100 MG/20ML
85.0000 mg/m2 | Freq: Once | INTRAVENOUS | Status: AC
  Administered 2018-09-08: 185 mg via INTRAVENOUS
  Filled 2018-09-08: qty 37

## 2018-09-08 NOTE — Patient Instructions (Signed)
[facility] Discharge Instructions for Patients Receiving Chemotherapy  Today you received the following chemotherapy agents   To help prevent nausea and vomiting after your treatment, we encourage you to take your nausea medication   If you develop nausea and vomiting that is not controlled by your nausea medication, call the clinic.   BELOW ARE SYMPTOMS THAT SHOULD BE REPORTED IMMEDIATELY:  *FEVER GREATER THAN 100.5 F  *CHILLS WITH OR WITHOUT FEVER  NAUSEA AND VOMITING THAT IS NOT CONTROLLED WITH YOUR NAUSEA MEDICATION  *UNUSUAL SHORTNESS OF BREATH  *UNUSUAL BRUISING OR BLEEDING  TENDERNESS IN MOUTH AND THROAT WITH OR WITHOUT PRESENCE OF ULCERS  *URINARY PROBLEMS  *BOWEL PROBLEMS  UNUSUAL RASH Items with * indicate a potential emergency and should be followed up as soon as possible.  Feel free to call the clinic should you have any questions or concerns. The clinic phone number is [phone].  Please show the CHEMO ALERT CARD at check-in to the Emergency Department and triage nurse.   

## 2018-09-08 NOTE — Progress Notes (Signed)
[facility]el [facility], [place] [id number]   CLINIC:  Medical Oncology/Hematology  PCP:  Joel [facility], [place] [address] [phone]   REASON FOR VISIT: Follow-up for colon cancer  CURRENT THERAPY: FOLFOX every 2 weeks  BRIEF ONCOLOGIC HISTORY:    Malignant neoplasm of sigmoid colon [facility])    Initial Diagnosis    Cancer of sigmoid colon ([facility])    03/27/2018 Genetic Testing    ALK c.350C>G VUS identified on the multicancer panel.  The Multi-Gene Panel offered by [facility] includes sequencing and/or deletion duplication testing of the following 85 genes: AIP, ALK, APC, ATM, AXIN2,BAP1,  BARD1, BLM, BMPR1A, BRCA1, BRCA2, BRIP1, CASR, CDC73, CDH1, CDK4, CDKN1B, CDKN1C, CDKN2A (p14ARF), CDKN2A (p16INK4a), CEBPA, CHEK2, CTNNA1, DICER1, DIS3L2, EGFR (c.2369C>T, p.Thr790Met variant only), EPCAM (Deletion/duplication testing only), FH, FLCN, GATA2, GPC3, GREM1 (Promoter region deletion/duplication testing only), HOXB13 (c.251G>A, p.Gly84Glu), HRAS, KIT, MAX, MEN1, MET, MITF (c.952G>A, p.Glu318Lys variant only), MLH1, MSH2, MSH3, MSH6, MUTYH, NBN, NF1, NF2, NTHL1, PALB2, PDGFRA, PHOX2B, PMS2, POLD1, POLE, POT1, PRKAR1A, PTCH1, PTEN, RAD50, RAD51C, RAD51D, RB1, RECQL4, RET, RNF43, RUNX1, SDHAF2, SDHA (sequence changes only), SDHB, SDHC, SDHD, SMAD4, SMARCA4, SMARCB1, SMARCE1, STK11, SUFU, TERC, TERT, TMEM127, TP53, TSC1, TSC2, VHL, WRN and WT1.   The report date is 03/27/2018.    04/21/2018 -  Chemotherapy    The patient had palonosetron (ALOXI) injection 0.25 mg, 0.25 mg, Intravenous,  Once, 11 of 12 cycles Administration: 0.25 mg (04/21/2018), 0.25 mg (05/05/2018), 0.25 mg (05/19/2018), 0.25 mg (06/02/2018), 0.25 mg (06/17/2018), 0.25 mg (06/30/2018), 0.25 mg (07/13/2018), 0.25 mg (08/11/2018), 0.25 mg (08/25/2018) leucovorin 800 mg in dextrose 5 % 250 mL infusion, 876 mg, Intravenous,  Once, 11 of 12 cycles Administration: 800 mg (04/21/2018), 900 mg  (05/05/2018), 800 mg (05/19/2018), 800 mg (06/02/2018), 900 mg (06/17/2018), 900 mg (06/30/2018), 900 mg (07/13/2018), 900 mg (08/11/2018), 900 mg (08/25/2018) oxaliplatin (ELOXATIN) 185 mg in dextrose 5 % 500 mL chemo infusion, 85 mg/m2 = 185 mg, Intravenous,  Once, 11 of 12 cycles Administration: 185 mg (04/21/2018), 185 mg (05/05/2018), 185 mg (05/19/2018), 185 mg (06/02/2018), 185 mg (06/17/2018), 185 mg (06/30/2018), 185 mg (07/13/2018), 185 mg (08/11/2018), 185 mg (08/25/2018) fluorouracil (ADRUCIL) chemo injection 900 mg, 400 mg/m2 = 900 mg, Intravenous,  Once, 11 of 12 cycles Administration: 900 mg (04/21/2018), 900 mg (05/05/2018), 900 mg (05/19/2018), 900 mg (06/02/2018), 900 mg (06/17/2018), 900 mg (06/30/2018), 900 mg (07/13/2018), 900 mg (08/11/2018), 900 mg (08/25/2018) fosaprepitant (EMEND) 150 mg, dexamethasone (DECADRON) 12 mg in sodium chloride 0.9 % 145 mL IVPB, , Intravenous,  Once, 8 of 9 cycles Administration:  (06/02/2018),  (06/17/2018),  (06/30/2018),  (07/13/2018),  (08/11/2018),  (08/25/2018) fluorouracil (ADRUCIL) 5,250 mg in sodium chloride 0.9 % 145 mL chemo infusion, 2,400 mg/m2 = 5,250 mg, Intravenous, 1 Day/Dose, 11 of 12 cycles Administration: 5,250 mg (04/21/2018), 5,250 mg (05/05/2018), 5,250 mg (05/19/2018), 5,250 mg (06/02/2018), 5,250 mg (06/17/2018), 5,250 mg (06/30/2018), 5,250 mg (07/13/2018), 5,250 mg (08/11/2018), 5,250 mg (08/25/2018)  for chemotherapy treatment.      INTERVAL HISTORY:  Joel Jefferson 43 y.o. male returns for routine follow-up for colon cancer.  He reports he has been doing well since his last visit.  He does have cold sensitivity for 6 to 7 days after his treatment.  He has mild tingling in his hands.  But it is stable at this time and has not changed.  He denies any bleeding per rectum or melena. Denies any nausea, vomiting, or  diarrhea. Denies any new pains. Had not noticed any recent bleeding such as epistaxis, hematuria or hematochezia. Denies recent chest pain on exertion, shortness of  breath on minimal exertion, pre-syncopal episodes, or palpitations. Denies any numbness or tingling in hands or feet. Denies any recent fevers, infections, or recent hospitalizations. Patient reports appetite at 100% and energy level at 100%.  He is eating well and maintaining his weight at this time.   REVIEW OF SYSTEMS:  Review of Systems  HENT:   Positive for mouth sores.   All other systems reviewed and are negative.    PAST MEDICAL/SURGICAL HISTORY:  Past Medical History:  Diagnosis Date  . Family history of breast cancer   . GERD (gastroesophageal reflux disease)   . History of kidney stones   . Hypercholesterolemia   . Hypertension   . Urethral stricture    Past Surgical History:  Procedure Laterality Date  . BALLOON DILATION  04/06/2012   Procedure: BALLOON DILATION;  Surgeon: Reece Packer, MD;  Location: [facility];  Service: Urology;  Laterality: N/A;  . BIOPSY  02/24/2018   Procedure: BIOPSY;  Surgeon: Danie Binder, MD;  [facility];  Service: Endoscopy;;  colon  . COLONOSCOPY WITH PROPOFOL N/A 02/24/2018   Procedure: COLONOSCOPY WITH PROPOFOL;  Surgeon: Danie Binder, MD;  Location: [facility];  Service: Endoscopy;  Laterality: N/A;  9:30am  . ESOPHAGOGASTRODUODENOSCOPY (EGD) WITH PROPOFOL N/A 08/29/2015   Dr. Oneida Alar: possible proximal esophageal web s/p dilation, moderate gastritis, negative eosinophilic esophagitis   . PARTIAL COLECTOMY N/A 03/02/2018   Procedure: PARTIAL COLECTOMY;  Surgeon: Aviva Signs, MD;  Location: [facility];  Service: General;  Laterality: N/A;  . PORTACATH PLACEMENT Left 04/13/2018   Procedure: INSERTION PORT-A-CATH;  Surgeon: Aviva Signs, MD;  Location: [facility];  Service: General;  Laterality: Left;  . SAVORY DILATION N/A 08/29/2015   Procedure: SAVORY DILATION;  Surgeon: Danie Binder, MD;  [facility];  Service: Endoscopy;  Laterality: N/A;  . URETHRAL DILATION    . WISDOM TOOTH EXTRACTION        SOCIAL HISTORY:  Social History   Socioeconomic History  . Marital status: Divorced    Spouse name: Not on file  . Number of children: Not on file  . Years of education: Not on file  . Highest education level: Not on file  Occupational History  . Not on file  Social Needs  . Financial resource strain: Not on file  . Food insecurity:    Worry: Not on file    Inability: Not on file  . Transportation needs:    Medical: Not on file    Non-medical: Not on file  Tobacco Use  . Smoking status: Former Smoker    Packs/day: 0.25    Years: 9.00    Pack years: 2.25    Types: Cigarettes    Last attempt to quit: 07/27/2011    Years since quitting: 7.1  . Smokeless tobacco: Never Used  Substance and Sexual Activity  . Alcohol use: Yes    Alcohol/week: 6.0 standard drinks    Types: 6 Cans of beer per week  . Drug use: No  . Sexual activity: Yes    Birth control/protection: None  Lifestyle  . Physical activity:    Days per week: Not on file    Minutes per session: Not on file  . Stress: Not on file  Relationships  . Social connections:    Talks on  phone: Not on file    Gets together: Not on file    Attends religious service: Not on file    Active member of club or organization: Not on file    Attends meetings of clubs or organizations: Not on file    Relationship status: Not on file  . Intimate partner violence:    Fear of current or ex partner: Not on file    Emotionally abused: Not on file    Physically abused: Not on file    Forced sexual activity: Not on file  Other Topics Concern  . Not on file  Social History Narrative  . Not on file    FAMILY HISTORY:  Family History  Problem Relation Age of Onset  . Dementia Mother   . Dementia Maternal Aunt   . Dementia Maternal Grandmother   . COPD Maternal Grandfather   . Breast cancer Maternal Aunt 42  . Breast cancer Maternal Aunt        dx in her 61s  . Lung cancer Maternal Aunt   . Healthy Son   . Colon  cancer Neg Hx     CURRENT MEDICATIONS:  Outpatient Encounter Medications as of 09/08/2018  Medication Sig  . Ascorbic Acid (VITAMIN C) 1000 MG tablet Take 1,000 mg by mouth daily.  . CONTOUR NEXT TEST test strip USE TO CHECK BLOOD SUGAR BID  . esomeprazole (NEXIUM) 40 MG capsule Take 40 mg by mouth daily.   . fluorouracil CALGB 40102 in sodium chloride 0.9 % 150 mL Inject 6,150 mg into the vein every 14 (fourteen) days. Over 46 hours  . glipiZIDE (GLIPIZIDE XL) 5 MG 24 hr tablet Take 1 tablet (5 mg total) by mouth daily with breakfast.  . LEUCOVORIN CALCIUM IV Inject 876 mg into the vein every 14 (fourteen) days.  Marland Kitchen lidocaine (XYLOCAINE) 2 % solution Use as directed 15 mLs in the mouth or throat 2 (two) times daily.  Marland Kitchen lidocaine-prilocaine (EMLA) cream Apply small amount to port site one hour prior to appointment. Cover with plastic wrap  . lisinopril (PRINIVIL,ZESTRIL) 10 MG tablet Take 10 mg by mouth daily.   Marland Kitchen LORazepam (ATIVAN) 0.5 MG tablet Take 1 tablet (0.5 mg total) by mouth every 8 (eight) hours.  . Magnesium 500 MG CAPS Take 500 mg by mouth daily.   . Multiple Vitamin (MULTIVITAMIN) tablet Take 1 tablet by mouth daily.  . ondansetron (ZOFRAN) 8 MG tablet Take 1 tablet (8 mg total) by mouth every 8 (eight) hours as needed for nausea or vomiting (may start taking as needed two days after chemotherapy).  . OXALIPLATIN IV Inject 185 mg into the vein every 14 (fourteen) days.  . Potassium (POTASSIMIN PO) Take 1 tablet by mouth daily.   . pravastatin (PRAVACHOL) 20 MG tablet Take 20 mg by mouth daily.   . promethazine (PHENERGAN) 25 MG tablet Take 1 tablet (25 mg total) by mouth every 6 (six) hours as needed for nausea or vomiting.  . topiramate (TOPAMAX) 100 MG tablet Take 100 mg by mouth 2 (two) times daily.    No facility-administered encounter medications on file as of 09/08/2018.     ALLERGIES:  No Known Allergies   PHYSICAL EXAM:  ECOG Performance status: 1  Vitals:    09/08/18 0812  BP: 126/87  Pulse: 91  SpO2: 100%   Filed Weights   09/08/18 0812  Weight: 215 lb 9.6 oz (97.8 kg)    Physical Exam Constitutional:      Appearance:  Normal appearance. He is normal weight.  Cardiovascular:     Rate and Rhythm: Normal rate and regular rhythm.     Heart sounds: Normal heart sounds.  Pulmonary:     Effort: Pulmonary effort is normal.     Breath sounds: Normal breath sounds.  Abdominal:     General: Bowel sounds are normal.     Palpations: Abdomen is soft.  Musculoskeletal: Normal range of motion.  Skin:    General: Skin is warm and dry.  Neurological:     Mental Status: He is alert and oriented to person, place, and time. Mental status is at baseline.  Psychiatric:        Mood and Affect: Mood normal.        Behavior: Behavior normal.        Thought Content: Thought content normal.        Judgment: Judgment normal.      LABORATORY DATA:  I have reviewed the labs as listed.  CBC    Component Value Date/Time   WBC 3.6 (L) 09/08/2018 0807   RBC 4.23 09/08/2018 0807   HGB 13.3 09/08/2018 0807   HCT 38.9 (L) 09/08/2018 0807   PLT 114 (L) 09/08/2018 0807   MCV 92.0 09/08/2018 0807   MCH 31.4 09/08/2018 0807   MCHC 34.2 09/08/2018 0807   RDW 13.7 09/08/2018 0807   LYMPHSABS 1.4 09/08/2018 0807   MONOABS 0.4 09/08/2018 0807   EOSABS 0.1 09/08/2018 0807   BASOSABS 0.0 09/08/2018 0807   CMP Latest Ref Rng & Units 09/08/2018 08/25/2018 08/11/2018  Glucose 70 - 99 mg/dL 204(H) 226(H) 466(H)  BUN 6 - 20 mg/dL '12 17 15  ' Creatinine 0.61 - 1.24 mg/dL 0.81 0.82 0.81  Sodium 135 - 145 mmol/L 140 139 136  Potassium 3.5 - 5.1 mmol/L 4.0 4.0 4.0  Chloride 98 - 111 mmol/L 112(H) 109 105  CO2 22 - 32 mmol/L 21(L) 22 22  Calcium 8.9 - 10.3 mg/dL 8.9 9.2 8.6(L)  Total Protein 6.5 - 8.1 g/dL 6.2(L) 6.2(L) 6.1(L)  Total Bilirubin 0.3 - 1.2 mg/dL 0.4 0.4 0.7  Alkaline Phos 38 - 126 U/L 65 67 82  AST 15 - 41 U/L 32 28 28  ALT 0 - 44 U/L 58(H) 62(H) 74(H)    I personally performed a face-to-face visit.  All questions were answered to patient's stated satisfaction. Encouraged patient to call with any new concerns or questions before his next visit to the cancer center and we can certain see him sooner, if needed.     ASSESSMENT & PLAN:   Malignant neoplasm of sigmoid colon ([place]) 1.  Stage III sigmoid colon adenocarcinoma: - Patient had left lower quadrant abdominal pain since October 2019, CT scan done at [facility] of the abdomen and pelvis showed sigmoid colon thickening. - He had a colonoscopy on 02/24/2018 showing a fungating infiltrative and polypoid partially obstructing large mass in the sigmoid colon between 20 to 25 cm from the anal verge.  This was consistent with colon cancer. - CT of the chest on 02/26/2018 shows 2 small pulmonary nodules, 7 x 5 mm subpleural nodule in the right major fissure and a 4 mm left lower lobe nodule.  Mild hepatic steatosis was seen. - He had a sigmoid colon segmental resection on 03/02/2018 by Dr. Arnoldo Morale.  This showed invasive adenocarcinoma, well-differentiated, 3.8 cm, tumor invades through muscularis propria, resection margins neagtive, 14/35 lymph nodes postive, satellite nodules x3, PT 3, MSI stable, PN2B, MMR preserved. - PET\CT  scan on 03/30/2018 showed bilateral hypermetabolic pelvic sidewall lymph nodes, subcentimeter, not in the initial distribution pattern for sigmoid primary.  Lymph nodes are in the bilateral external iliac and the right inguinal lymph node.  A pelvic CT in 3 months is recommended.  Given a normal CEA level, they are more likely reactive. - 9 cycles of FOLFOX from 04/21/2021 08/25/2018. -He had minor cold sensitivity.  He also had aphthous ulcers in the mouth.  Denies any tingling or numbness in the extremities.  He denied any GI side effects including vomiting or diarrhea.  He did have mild nausea which was well controlled with Ativan. - We will review his blood work today and he may  proceed with cycle 10th.  No dose modification needed. - We will see him back in 2 weeks for follow-up.      Orders placed this encounter:  Orders Placed This Encounter  Procedures  . CBC with Differential/Platelet  . Comprehensive metabolic panel    Francene Finders FNP-C [place] [phone]

## 2018-09-08 NOTE — Patient Instructions (Signed)
[facility] at [facility] Discharge Instructions   Follow up in 2 weeks with labs and treatment  Thank you for choosing [facility] at [facility] to provide your oncology and hematology care.  To afford each patient quality time with our provider, please arrive at least 15 minutes before your scheduled appointment time.   If you have a lab appointment with the Cancer Center please come in thru the  Main Entrance and check in at the main information desk  You need to re-schedule your appointment should you arrive 10 or more minutes late.  We strive to give you quality time with our providers, and arriving late affects you and other patients whose appointments are after yours.  Also, if you no show three or more times for appointments you may be dismissed from the clinic at the providers discretion.     Again, thank you for choosing [facility].  Our hope is that these requests will decrease the amount of time that you wait before being seen by our physicians.       _____________________________________________________________  Should you have questions after your visit to  Cancer Center, please contact our office at [phone] between the hours of 8:00 a.m. and 4:30 p.m.  Voicemails left after 4:00 p.m. will not be returned until the following business day.  For prescription refill requests, have your pharmacy contact our office and allow 72 hours.    Cancer Center Support Programs:   > Cancer Support Group  2nd Tuesday of the month 1pm-2pm, Journey Room    

## 2018-09-08 NOTE — Patient Instructions (Signed)
[facility] at [facility] Discharge Instructions  Labs drawn from portacath today   Thank you for choosing [facility] at [facility] to provide your oncology and hematology care.  To afford each patient quality time with our provider, please arrive at least 15 minutes before your scheduled appointment time.   If you have a lab appointment with the Cancer Center please come in thru the  Main Entrance and check in at the main information desk  You need to re-schedule your appointment should you arrive 10 or more minutes late.  We strive to give you quality time with our providers, and arriving late affects you and other patients whose appointments are after yours.  Also, if you no show three or more times for appointments you may be dismissed from the clinic at the providers discretion.     Again, thank you for choosing [facility].  Our hope is that these requests will decrease the amount of time that you wait before being seen by our physicians.       _____________________________________________________________  Should you have questions after your visit to  Cancer Center, please contact our office at [phone] between the hours of 8:00 a.m. and 4:30 p.m.  Voicemails left after 4:00 p.m. will not be returned until the following business day.  For prescription refill requests, have your pharmacy contact our office and allow 72 hours.    Cancer Center Support Programs:   > Cancer Support Group  2nd Tuesday of the month 1pm-2pm, Journey Room   

## 2018-09-08 NOTE — Assessment & Plan Note (Addendum)
1.  Stage III sigmoid colon adenocarcinoma: - Patient had left lower quadrant abdominal pain since October 2019, CT scan done at [facility] of the abdomen and pelvis showed sigmoid colon thickening. - He had a colonoscopy on 02/24/2018 showing a fungating infiltrative and polypoid partially obstructing large mass in the sigmoid colon between 20 to 25 cm from the anal verge.  This was consistent with colon cancer. - CT of the chest on 02/26/2018 shows 2 small pulmonary nodules, 7 x 5 mm subpleural nodule in the right major fissure and a 4 mm left lower lobe nodule.  Mild hepatic steatosis was seen. - He had a sigmoid colon segmental resection on 03/02/2018 by Dr. Arnoldo Morale.  This showed invasive adenocarcinoma, well-differentiated, 3.8 cm, tumor invades through muscularis propria, resection margins neagtive, 14/35 lymph nodes postive, satellite nodules x3, PT 3, MSI stable, PN2B, MMR preserved. - PET\CT scan on 03/30/2018 showed bilateral hypermetabolic pelvic sidewall lymph nodes, subcentimeter, not in the initial distribution pattern for sigmoid primary.  Lymph nodes are in the bilateral external iliac and the right inguinal lymph node.  A pelvic CT in 3 months is recommended.  Given a normal CEA level, they are more likely reactive. - 9 cycles of FOLFOX from 04/21/2021 08/25/2018. -He had minor cold sensitivity.  He also had aphthous ulcers in the mouth.  Denies any tingling or numbness in the extremities.  He denied any GI side effects including vomiting or diarrhea.  He did have mild nausea which was well controlled with Ativan. - We will review his blood work today and he may proceed with cycle 10th.  No dose modification needed. - We will see him back in 2 weeks for follow-up.

## 2018-09-08 NOTE — Progress Notes (Signed)
Labs reviewed by Francene Finders NP today. Proceed with treatment today.   Treatment given per orders. Patient tolerated it well without problems. Vitals stable and discharged home from clinic ambulatory. Follow up as scheduled.

## 2018-09-10 ENCOUNTER — Other Ambulatory Visit: Payer: Self-pay

## 2018-09-10 ENCOUNTER — Inpatient Hospital Stay (HOSPITAL_COMMUNITY): Payer: BC Managed Care – PPO | Attending: Hematology

## 2018-09-10 DIAGNOSIS — C187 Malignant neoplasm of sigmoid colon: Secondary | ICD-10-CM | POA: Insufficient documentation

## 2018-09-10 DIAGNOSIS — Z452 Encounter for adjustment and management of vascular access device: Secondary | ICD-10-CM | POA: Insufficient documentation

## 2018-09-10 MED ORDER — HEPARIN SOD (PORK) LOCK FLUSH 100 UNIT/ML IV SOLN
500.0000 [IU] | Freq: Once | INTRAVENOUS | Status: AC | PRN
  Administered 2018-09-10: 500 [IU]

## 2018-09-10 MED ORDER — SODIUM CHLORIDE 0.9% FLUSH
10.0000 mL | INTRAVENOUS | Status: DC | PRN
  Administered 2018-09-10: 10 mL
  Filled 2018-09-10: qty 10

## 2018-09-10 MED ORDER — OCTREOTIDE ACETATE 30 MG IM KIT
PACK | INTRAMUSCULAR | Status: AC
  Filled 2018-09-10: qty 1

## 2018-09-20 DIAGNOSIS — C187 Malignant neoplasm of sigmoid colon: Secondary | ICD-10-CM | POA: Diagnosis not present

## 2018-09-21 ENCOUNTER — Other Ambulatory Visit: Payer: Self-pay

## 2018-09-22 ENCOUNTER — Inpatient Hospital Stay (HOSPITAL_BASED_OUTPATIENT_CLINIC_OR_DEPARTMENT_OTHER): Payer: BC Managed Care – PPO | Admitting: Hematology

## 2018-09-22 ENCOUNTER — Inpatient Hospital Stay (HOSPITAL_COMMUNITY): Payer: BC Managed Care – PPO

## 2018-09-22 ENCOUNTER — Encounter (HOSPITAL_COMMUNITY): Payer: Self-pay | Admitting: Hematology

## 2018-09-22 VITALS — BP 137/89 | HR 88 | Temp 98.1°F | Resp 18

## 2018-09-22 DIAGNOSIS — C187 Malignant neoplasm of sigmoid colon: Secondary | ICD-10-CM

## 2018-09-22 DIAGNOSIS — G629 Polyneuropathy, unspecified: Secondary | ICD-10-CM

## 2018-09-22 DIAGNOSIS — Z452 Encounter for adjustment and management of vascular access device: Secondary | ICD-10-CM | POA: Diagnosis not present

## 2018-09-22 DIAGNOSIS — Z5111 Encounter for antineoplastic chemotherapy: Secondary | ICD-10-CM | POA: Diagnosis not present

## 2018-09-22 LAB — CBC WITH DIFFERENTIAL/PLATELET
Abs Immature Granulocytes: 0.01 10*3/uL (ref 0.00–0.07)
Basophils Absolute: 0 10*3/uL (ref 0.0–0.1)
Basophils Relative: 1 %
Eosinophils Absolute: 0.1 10*3/uL (ref 0.0–0.5)
Eosinophils Relative: 2 %
HCT: 39.9 % (ref 39.0–52.0)
Hemoglobin: 13.7 g/dL (ref 13.0–17.0)
Immature Granulocytes: 0 %
Lymphocytes Relative: 32 %
Lymphs Abs: 1.1 10*3/uL (ref 0.7–4.0)
MCH: 31 pg (ref 26.0–34.0)
MCHC: 34.3 g/dL (ref 30.0–36.0)
MCV: 90.3 fL (ref 80.0–100.0)
Monocytes Absolute: 0.4 10*3/uL (ref 0.1–1.0)
Monocytes Relative: 11 %
Neutro Abs: 1.8 10*3/uL (ref 1.7–7.7)
Neutrophils Relative %: 54 %
Platelets: 102 10*3/uL — ABNORMAL LOW (ref 150–400)
RBC: 4.42 MIL/uL (ref 4.22–5.81)
RDW: 13.8 % (ref 11.5–15.5)
WBC: 3.4 10*3/uL — ABNORMAL LOW (ref 4.0–10.5)
nRBC: 0 % (ref 0.0–0.2)

## 2018-09-22 LAB — COMPREHENSIVE METABOLIC PANEL
ALT: 61 U/L — ABNORMAL HIGH (ref 0–44)
AST: 39 U/L (ref 15–41)
Albumin: 3.9 g/dL (ref 3.5–5.0)
Alkaline Phosphatase: 51 U/L (ref 38–126)
Anion gap: 11 (ref 5–15)
BUN: 13 mg/dL (ref 6–20)
CO2: 18 mmol/L — ABNORMAL LOW (ref 22–32)
Calcium: 8.7 mg/dL — ABNORMAL LOW (ref 8.9–10.3)
Chloride: 111 mmol/L (ref 98–111)
Creatinine, Ser: 0.92 mg/dL (ref 0.61–1.24)
GFR calc Af Amer: >60 mL/min (ref >60)
GFR calc non Af Amer: >60 mL/min (ref >60)
Glucose, Bld: 220 mg/dL — ABNORMAL HIGH (ref 70–99)
Potassium: 3.8 mmol/L (ref 3.5–5.1)
Sodium: 140 mmol/L (ref 135–145)
Total Bilirubin: 0.9 mg/dL (ref 0.3–1.2)
Total Protein: 6.3 g/dL — ABNORMAL LOW (ref 6.5–8.1)

## 2018-09-22 MED ORDER — LORAZEPAM 2 MG/ML IJ SOLN
1.0000 mg | Freq: Once | INTRAMUSCULAR | Status: AC
  Administered 2018-09-22: 1 mg via INTRAVENOUS
  Filled 2018-09-22: qty 1

## 2018-09-22 MED ORDER — SODIUM CHLORIDE 0.9% FLUSH
10.0000 mL | INTRAVENOUS | Status: DC | PRN
  Administered 2018-09-22: 10 mL
  Filled 2018-09-22: qty 10

## 2018-09-22 MED ORDER — DEXTROSE 5 % IV SOLN
Freq: Once | INTRAVENOUS | Status: AC
  Administered 2018-09-22: 10:00:00 via INTRAVENOUS

## 2018-09-22 MED ORDER — DEXTROSE 5 % IV SOLN
Freq: Once | INTRAVENOUS | Status: AC
  Administered 2018-09-22: 11:00:00 via INTRAVENOUS

## 2018-09-22 MED ORDER — FLUOROURACIL CHEMO INJECTION 2.5 GM/50ML
400.0000 mg/m2 | Freq: Once | INTRAVENOUS | Status: AC
  Administered 2018-09-22: 900 mg via INTRAVENOUS
  Filled 2018-09-22: qty 18

## 2018-09-22 MED ORDER — LEUCOVORIN CALCIUM INJECTION 350 MG
400.0000 mg/m2 | Freq: Once | INTRAVENOUS | Status: AC
  Administered 2018-09-22: 876 mg via INTRAVENOUS
  Filled 2018-09-22: qty 43.8

## 2018-09-22 MED ORDER — PALONOSETRON HCL INJECTION 0.25 MG/5ML
0.2500 mg | Freq: Once | INTRAVENOUS | Status: AC
  Administered 2018-09-22: 0.25 mg via INTRAVENOUS

## 2018-09-22 MED ORDER — PALONOSETRON HCL INJECTION 0.25 MG/5ML
INTRAVENOUS | Status: AC
  Filled 2018-09-22: qty 5

## 2018-09-22 MED ORDER — OXALIPLATIN CHEMO INJECTION 100 MG/20ML
68.0000 mg/m2 | Freq: Once | INTRAVENOUS | Status: AC
  Administered 2018-09-22: 150 mg via INTRAVENOUS
  Filled 2018-09-22: qty 20

## 2018-09-22 MED ORDER — SODIUM CHLORIDE 0.9 % IV SOLN
Freq: Once | INTRAVENOUS | Status: AC
  Administered 2018-09-22: 11:00:00 via INTRAVENOUS
  Filled 2018-09-22: qty 5

## 2018-09-22 MED ORDER — SODIUM CHLORIDE 0.9 % IV SOLN
2400.0000 mg/m2 | INTRAVENOUS | Status: DC
  Administered 2018-09-22: 5250 mg via INTRAVENOUS
  Filled 2018-09-22: qty 105

## 2018-09-22 NOTE — Assessment & Plan Note (Signed)
1.  Stage III sigmoid colon adenocarcinoma: - Patient had left lower quadrant abdominal pain since October 2019, CT scan done at [facility] of the abdomen and pelvis showed sigmoid colon thickening. - He had a colonoscopy on 02/24/2018 showing a fungating infiltrative and polypoid partially obstructing large mass in the sigmoid colon between 20 to 25 cm from the anal verge.  This was consistent with colon cancer. - CT of the chest on 02/26/2018 shows 2 small pulmonary nodules, 7 x 5 mm subpleural nodule in the right major fissure and a 4 mm left lower lobe nodule.  Mild hepatic steatosis was seen. - He had a sigmoid colon segmental resection on 03/02/2018 by Dr. Arnoldo Morale.  This showed invasive adenocarcinoma, well-differentiated, 3.8 cm, tumor invades through muscularis propria, resection margins neagtive, 14/35 lymph nodes postive, satellite nodules x3, PT 3, MSI stable, PN2B, MMR preserved. - PET\CT scan on 03/30/2018 showed bilateral hypermetabolic pelvic sidewall lymph nodes, subcentimeter, not in the initial distribution pattern for sigmoid primary.  Lymph nodes are in the bilateral external iliac and the right inguinal lymph node.  A pelvic CT in 3 months is recommended.  Given a normal CEA level, they are more likely reactive. - 10 cycles of FOLFOX from 04/21/2018 through 09/08/2018. -He had minor cold sensitivity.  He also developed on and off numbness in the feet for the last 1 to 2 weeks.  No numbness or tingling in the fingertips. -I have reviewed his labs.  They are adequate to proceed with cycle 11 of chemotherapy today. -His mild nausea is controlled well with Ativan. -I will reevaluate him in 2 weeks for his last cycle of chemotherapy.  We plan to do scans after cycle 12.  2.  Peripheral neuropathy: - He has developed on and off numbness in the feet after cycle 10.  It can happen up to 4 times a day. -I have recommended dose reduction of oxaliplatin by 20% starting cycle 11. -We will closely  monitor his neuropathy.

## 2018-09-22 NOTE — Progress Notes (Signed)
Labs drawn from port. 

## 2018-09-22 NOTE — Patient Instructions (Signed)
[facility] at [facility] Discharge Instructions  You were seen today by Dr. Katragadda. He went over your recent results. He will see you back in for labs and follow up.   Thank you for choosing [facility] at [facility] to provide your oncology and hematology care.  To afford each patient quality time with our provider, please arrive at least 15 minutes before your scheduled appointment time.   If you have a lab appointment with the Cancer Center please come in thru the  Main Entrance and check in at the main information desk  You need to re-schedule your appointment should you arrive 10 or more minutes late.  We strive to give you quality time with our providers, and arriving late affects you and other patients whose appointments are after yours.  Also, if you no show three or more times for appointments you may be dismissed from the clinic at the providers discretion.     Again, thank you for choosing [facility].  Our hope is that these requests will decrease the amount of time that you wait before being seen by our physicians.       _____________________________________________________________  Should you have questions after your visit to [facility], please contact our office at [phone] between the hours of 8:00 a.m. and 4:30 p.m.  Voicemails left after 4:00 p.m. will not be returned until the following business day.  For prescription refill requests, have your pharmacy contact our office and allow 72 hours.    Cancer Center Support Programs:   > Cancer Support Group  2nd Tuesday of the month 1pm-2pm, Journey Room    

## 2018-09-22 NOTE — Patient Instructions (Signed)
[facility] Discharge Instructions for Patients Receiving Chemotherapy  Today you received the following chemotherapy agents   To help prevent nausea and vomiting after your treatment, we encourage you to take your nausea medication   If you develop nausea and vomiting that is not controlled by your nausea medication, call the clinic.   BELOW ARE SYMPTOMS THAT SHOULD BE REPORTED IMMEDIATELY:  *FEVER GREATER THAN 100.5 F  *CHILLS WITH OR WITHOUT FEVER  NAUSEA AND VOMITING THAT IS NOT CONTROLLED WITH YOUR NAUSEA MEDICATION  *UNUSUAL SHORTNESS OF BREATH  *UNUSUAL BRUISING OR BLEEDING  TENDERNESS IN MOUTH AND THROAT WITH OR WITHOUT PRESENCE OF ULCERS  *URINARY PROBLEMS  *BOWEL PROBLEMS  UNUSUAL RASH Items with * indicate a potential emergency and should be followed up as soon as possible.  Feel free to call the clinic should you have any questions or concerns. The clinic phone number is [phone].  Please show the CHEMO ALERT CARD at check-in to the Emergency Department and triage nurse.   

## 2018-09-22 NOTE — Progress Notes (Signed)
[address] [place], [place] [address]   CLINIC:  Medical Oncology/Hematology  PCP:  Sharilyn Sites, [place] [address] [phone]   REASON FOR VISIT:  Follow-up for stage III colon cancer  CURRENT THERAPY:Folfox every 2 weeks   BRIEF ONCOLOGIC HISTORY:  Oncology History  Malignant neoplasm of sigmoid colon [facility])   Initial Diagnosis   Cancer of sigmoid colon ([place])   03/27/2018 Genetic Testing   ALK c.350C>G VUS identified on the multicancer panel.  The Multi-Gene Panel offered by [facility] includes sequencing and/or deletion duplication testing of the following 85 genes: AIP, ALK, APC, ATM, AXIN2,BAP1,  BARD1, BLM, BMPR1A, BRCA1, BRCA2, BRIP1, CASR, CDC73, CDH1, CDK4, CDKN1B, CDKN1C, CDKN2A (p14ARF), CDKN2A (p16INK4a), CEBPA, CHEK2, CTNNA1, DICER1, DIS3L2, EGFR (c.2369C>T, p.Thr790Met variant only), EPCAM (Deletion/duplication testing only), FH, FLCN, GATA2, GPC3, GREM1 (Promoter region deletion/duplication testing only), HOXB13 (c.251G>A, p.Gly84Glu), HRAS, KIT, MAX, MEN1, MET, MITF (c.952G>A, p.Glu318Lys variant only), MLH1, MSH2, MSH3, MSH6, MUTYH, NBN, NF1, NF2, NTHL1, PALB2, PDGFRA, PHOX2B, PMS2, POLD1, POLE, POT1, PRKAR1A, PTCH1, PTEN, RAD50, RAD51C, RAD51D, RB1, RECQL4, RET, RNF43, RUNX1, SDHAF2, SDHA (sequence changes only), SDHB, SDHC, SDHD, SMAD4, SMARCA4, SMARCB1, SMARCE1, STK11, SUFU, TERC, TERT, TMEM127, TP53, TSC1, TSC2, VHL, WRN and WT1.   The report date is 03/27/2018.   04/21/2018 -  Chemotherapy   The patient had palonosetron (ALOXI) injection 0.25 mg, 0.25 mg, Intravenous,  Once, 11 of 13 cycles Administration: 0.25 mg (04/21/2018), 0.25 mg (05/05/2018), 0.25 mg (05/19/2018), 0.25 mg (06/02/2018), 0.25 mg (06/17/2018), 0.25 mg (06/30/2018), 0.25 mg (07/13/2018), 0.25 mg (08/11/2018), 0.25 mg (08/25/2018), 0.25 mg (09/08/2018) leucovorin 800 mg in dextrose 5 % 250 mL infusion, 876 mg, Intravenous,  Once, 11 of 13  cycles Administration: 800 mg (04/21/2018), 900 mg (05/05/2018), 800 mg (05/19/2018), 800 mg (06/02/2018), 900 mg (06/17/2018), 900 mg (06/30/2018), 900 mg (07/13/2018), 900 mg (08/11/2018), 900 mg (08/25/2018), 900 mg (09/08/2018) oxaliplatin (ELOXATIN) 185 mg in dextrose 5 % 500 mL chemo infusion, 85 mg/m2 = 185 mg, Intravenous,  Once, 11 of 13 cycles Dose modification: 68 mg/m2 (80 % of original dose 85 mg/m2, Cycle 12, Reason: Other (see comments), Comment: neuropathy) Administration: 185 mg (04/21/2018), 185 mg (05/05/2018), 185 mg (05/19/2018), 185 mg (06/02/2018), 185 mg (06/17/2018), 185 mg (06/30/2018), 185 mg (07/13/2018), 185 mg (08/11/2018), 185 mg (08/25/2018), 185 mg (09/08/2018) fluorouracil (ADRUCIL) chemo injection 900 mg, 400 mg/m2 = 900 mg, Intravenous,  Once, 11 of 13 cycles Administration: 900 mg (04/21/2018), 900 mg (05/05/2018), 900 mg (05/19/2018), 900 mg (06/02/2018), 900 mg (06/17/2018), 900 mg (06/30/2018), 900 mg (07/13/2018), 900 mg (08/11/2018), 900 mg (08/25/2018), 900 mg (09/08/2018) fosaprepitant (EMEND) 150 mg, dexamethasone (DECADRON) 12 mg in sodium chloride 0.9 % 145 mL IVPB, , Intravenous,  Once, 8 of 10 cycles Administration:  (06/02/2018),  (06/17/2018),  (06/30/2018),  (07/13/2018),  (08/11/2018),  (08/25/2018),  (09/08/2018) fluorouracil (ADRUCIL) 5,250 mg in sodium chloride 0.9 % 145 mL chemo infusion, 2,400 mg/m2 = 5,250 mg, Intravenous, 1 Day/Dose, 11 of 13 cycles Administration: 5,250 mg (04/21/2018), 5,250 mg (05/05/2018), 5,250 mg (05/19/2018), 5,250 mg (06/02/2018), 5,250 mg (06/17/2018), 5,250 mg (06/30/2018), 5,250 mg (07/13/2018), 5,250 mg (08/11/2018), 5,250 mg (08/25/2018), 5,250 mg (09/08/2018)  for chemotherapy treatment.       CANCER STAGING: Cancer Staging No matching staging information was found for the patient.   INTERVAL HISTORY:  Joel Jefferson 43 y.o. male here for follow-up and cycle 11 of chemotherapy.  Appetite is 100%.  Energy levels are 75%.  He developed some numbness in the feet on and off  for the last 2 weeks.  It can happen up to 4 times a day.  Denies any mouth ulcers.  Pain is reported as 0.  Denies any nausea or vomiting.  Nausea is well controlled with Ativan.  Denies any constipation or diarrhea.  He was wondering when he can return back to work.  He works as a Public relations account executive.  Denies any ER visits or hospitalizations in the last 2 weeks.    REVIEW OF SYSTEMS:  Review of Systems  HENT:   Negative for mouth sores.   Gastrointestinal: Negative for constipation, diarrhea and nausea.  Neurological: Positive for numbness.  All other systems reviewed and are negative.    PAST MEDICAL/SURGICAL HISTORY:  Past Medical History:  Diagnosis Date  . Family history of breast cancer   . GERD (gastroesophageal reflux disease)   . History of kidney stones   . Hypercholesterolemia   . Hypertension   . Urethral stricture    Past Surgical History:  Procedure Laterality Date  . BALLOON DILATION  04/06/2012   Procedure: BALLOON DILATION;  Surgeon: Reece Packer, MD;  Location: [facility];  Service: Urology;  Laterality: N/A;  . BIOPSY  02/24/2018   Procedure: BIOPSY;  Surgeon: Danie Binder, MD;  [facility];  Service: Endoscopy;;  colon  . COLONOSCOPY WITH PROPOFOL N/A 02/24/2018   Procedure: COLONOSCOPY WITH PROPOFOL;  Surgeon: Danie Binder, MD;  [facility]: [facility];  Service: Endoscopy;  Laterality: N/A;  9:30am  . ESOPHAGOGASTRODUODENOSCOPY (EGD) WITH PROPOFOL N/A 08/29/2015   Dr. Oneida Alar: possible proximal esophageal web s/p dilation, moderate gastritis, negative eosinophilic esophagitis   . PARTIAL COLECTOMY N/A 03/02/2018   Procedure: PARTIAL COLECTOMY;  Surgeon: Aviva Signs, MD;  Location: [facility];  Service: General;  Laterality: N/A;  . PORTACATH PLACEMENT Left 04/13/2018   Procedure: INSERTION PORT-A-CATH;  Surgeon: Aviva Signs, MD;  Location: [facility];  Service: General;  Laterality: Left;  . SAVORY DILATION N/A 08/29/2015   Procedure:  SAVORY DILATION;  Surgeon: Danie Binder, MD;  [facility];  Service: Endoscopy;  Laterality: N/A;  . URETHRAL DILATION    . WISDOM TOOTH EXTRACTION       SOCIAL HISTORY:  Social History   Socioeconomic History  . Marital status: Divorced    Spouse name: Not on file  . Number of children: Not on file  . Years of education: Not on file  . Highest education level: Not on file  Occupational History  . Not on file  Social Needs  . Financial resource strain: Not on file  . Food insecurity    Worry: Not on file    Inability: Not on file  . Transportation needs    Medical: Not on file    Non-medical: Not on file  Tobacco Use  . Smoking status: Former Smoker    Packs/day: 0.25    Years: 9.00    Pack years: 2.25    Types: Cigarettes    Quit date: 07/27/2011    Years since quitting: 7.1  . Smokeless tobacco: Never Used  Substance and Sexual Activity  . Alcohol use: Yes    Alcohol/week: 6.0 standard drinks    Types: 6 Cans of beer per week  . Drug use: No  . Sexual activity: Yes    Birth control/protection: None  Lifestyle  . Physical activity    Days per week: Not on file  Minutes per session: Not on file  . Stress: Not on file  Relationships  . Social Herbalist on phone: Not on file    Gets together: Not on file    Attends religious service: Not on file    Active member of club or organization: Not on file    Attends meetings of clubs or organizations: Not on file    Relationship status: Not on file  . Intimate partner violence    Fear of current or ex partner: Not on file    Emotionally abused: Not on file    Physically abused: Not on file    Forced sexual activity: Not on file  Other Topics Concern  . Not on file  Social History Narrative  . Not on file    FAMILY HISTORY:  Family History  Problem Relation Age of Onset  . Dementia Mother   . Dementia Maternal Aunt   . Dementia Maternal Grandmother   . COPD Maternal Grandfather    . Breast cancer Maternal Aunt 42  . Breast cancer Maternal Aunt        dx in her 62s  . Lung cancer Maternal Aunt   . Healthy Son   . Colon cancer Neg Hx     CURRENT MEDICATIONS:  Outpatient Encounter Medications as of 09/22/2018  Medication Sig  . Ascorbic Acid (VITAMIN C) 1000 MG tablet Take 1,000 mg by mouth daily.  . CONTOUR NEXT TEST test strip USE TO CHECK BLOOD SUGAR BID  . esomeprazole (NEXIUM) 40 MG capsule Take 40 mg by mouth daily.   . fluorouracil CALGB 66060 in sodium chloride 0.9 % 150 mL Inject 6,150 mg into the vein every 14 (fourteen) days. Over 46 hours  . glipiZIDE (GLIPIZIDE XL) 5 MG 24 hr tablet Take 1 tablet (5 mg total) by mouth daily with breakfast.  . LEUCOVORIN CALCIUM IV Inject 876 mg into the vein every 14 (fourteen) days.  Marland Kitchen lidocaine (XYLOCAINE) 2 % solution Use as directed 15 mLs in the mouth or throat 2 (two) times daily.  Marland Kitchen lidocaine-prilocaine (EMLA) cream Apply small amount to port site one hour prior to appointment. Cover with plastic wrap  . lisinopril (PRINIVIL,ZESTRIL) 10 MG tablet Take 10 mg by mouth daily.   Marland Kitchen LORazepam (ATIVAN) 0.5 MG tablet Take 1 tablet (0.5 mg total) by mouth every 8 (eight) hours.  . Magnesium 500 MG CAPS Take 500 mg by mouth daily.   . Multiple Vitamin (MULTIVITAMIN) tablet Take 1 tablet by mouth daily.  . ondansetron (ZOFRAN) 8 MG tablet Take 1 tablet (8 mg total) by mouth every 8 (eight) hours as needed for nausea or vomiting (may start taking as needed two days after chemotherapy).  . OXALIPLATIN IV Inject 185 mg into the vein every 14 (fourteen) days.  . Potassium (POTASSIMIN PO) Take 1 tablet by mouth daily.   . pravastatin (PRAVACHOL) 20 MG tablet Take 20 mg by mouth daily.   . promethazine (PHENERGAN) 25 MG tablet Take 1 tablet (25 mg total) by mouth every 6 (six) hours as needed for nausea or vomiting.  . topiramate (TOPAMAX) 100 MG tablet Take 100 mg by mouth 2 (two) times daily.    Facility-Administered Encounter  Medications as of 09/22/2018  Medication  . sodium chloride flush (NS) 0.9 % injection 10 mL    ALLERGIES:  No Known Allergies   PHYSICAL EXAM:  ECOG Performance status: 1  Vitals:   09/22/18 0901  BP: 130/89  Pulse:  96  Resp: 16  Temp: 98.3 F (36.8 C)  SpO2: 100%   Filed Weights   09/22/18 0901  Weight: 213 lb 9.6 oz (96.9 kg)    Physical Exam Vitals signs reviewed.  Constitutional:      Appearance: Normal appearance.  Cardiovascular:     Rate and Rhythm: Normal rate and regular rhythm.     Heart sounds: Normal heart sounds.  Pulmonary:     Effort: Pulmonary effort is normal.     Breath sounds: Normal breath sounds.  Abdominal:     General: There is no distension.     Palpations: Abdomen is soft. There is no mass.  Musculoskeletal:        General: No swelling.  Skin:    General: Skin is warm.  Neurological:     General: No focal deficit present.     Mental Status: He is alert and oriented to person, place, and time.  Psychiatric:        Mood and Affect: Mood normal.        Behavior: Behavior normal.      LABORATORY DATA:  I have reviewed the labs as listed.  CBC    Component Value Date/Time   WBC 3.4 (L) 09/22/2018 0903   RBC 4.42 09/22/2018 0903   HGB 13.7 09/22/2018 0903   HCT 39.9 09/22/2018 0903   PLT 102 (L) 09/22/2018 0903   MCV 90.3 09/22/2018 0903   MCH 31.0 09/22/2018 0903   MCHC 34.3 09/22/2018 0903   RDW 13.8 09/22/2018 0903   LYMPHSABS 1.1 09/22/2018 0903   MONOABS 0.4 09/22/2018 0903   EOSABS 0.1 09/22/2018 0903   BASOSABS 0.0 09/22/2018 0903   CMP Latest Ref Rng & Units 09/22/2018 09/08/2018 08/25/2018  Glucose 70 - 99 mg/dL 220(H) 204(H) 226(H)  BUN 6 - 20 mg/dL _0 Creatinine 0.61 - 1.24 mg/dL 0.92 0.81 0.82  Sodium 135 - 145 mmol/L 140 140 139  Potassium 3.5 - 5.1 mmol/L 3.8 4.0 4.0  Chloride 98 - 111 mmol/L 111 112(H) 109  CO2 22 - 32 mmol/L 18(L) 21(L) 22  Calcium 8.9 - 10.3 mg/dL 8.7(L) 8.9 9.2  Total Protein 6.5  - 8.1 g/dL 6.3(L) 6.2(L) 6.2(L)  Total Bilirubin 0.3 - 1.2 mg/dL 0.9 0.4 0.4  Alkaline Phos 38 - 126 U/L 51 65 67  AST 15 - 41 U/L 39 32 28  ALT 0 - 44 U/L 61(H) 58(H) 62(H)       DIAGNOSTIC IMAGING:  I have independently reviewed the scans and discussed with the patient.      ASSESSMENT & PLAN:   Malignant neoplasm of sigmoid colon ([place]) 1.  Stage III sigmoid colon adenocarcinoma: - Patient had left lower quadrant abdominal pain since October 2019, CT scan done at [facility] of the abdomen and pelvis showed sigmoid colon thickening. - He had a colonoscopy on 02/24/2018 showing a fungating infiltrative and polypoid partially obstructing large mass in the sigmoid colon between 20 to 25 cm from the anal verge.  This was consistent with colon cancer. - CT of the chest on 02/26/2018 shows 2 small pulmonary nodules, 7 x 5 mm subpleural nodule in the right major fissure and a 4 mm left lower lobe nodule.  Mild hepatic steatosis was seen. - He had a sigmoid colon segmental resection on 03/02/2018 by Dr. Arnoldo Morale.  This showed invasive adenocarcinoma, well-differentiated, 3.8 cm, tumor invades through muscularis propria, resection margins neagtive, 14/35 lymph nodes postive, satellite nodules x3, PT 3,  MSI stable, PN2B, MMR preserved. - PET\CT scan on 03/30/2018 showed bilateral hypermetabolic pelvic sidewall lymph nodes, subcentimeter, not in the initial distribution pattern for sigmoid primary.  Lymph nodes are in the bilateral external iliac and the right inguinal lymph node.  A pelvic CT in 3 months is recommended.  Given a normal CEA level, they are more likely reactive. - 10 cycles of FOLFOX from 04/21/2018 through 09/08/2018. -He had minor cold sensitivity.  He also developed on and off numbness in the feet for the last 1 to 2 weeks.  No numbness or tingling in the fingertips. -I have reviewed his labs.  They are adequate to proceed with cycle 11 of chemotherapy today. -His mild nausea is  controlled well with Ativan. -I will reevaluate him in 2 weeks for his last cycle of chemotherapy.  We plan to do scans after cycle 12.  2.  Peripheral neuropathy: - He has developed on and off numbness in the feet after cycle 10.  It can happen up to 4 times a day. -I have recommended dose reduction of oxaliplatin by 20% starting cycle 11. -We will closely monitor his neuropathy.  Total time spent is 25 minutes with more than 50% of the time spent face-to-face discussing dose reduction, treatment plan and coordination of care.    Orders placed this encounter:  No orders of the defined types were placed in this encounter.     Derek Jack, MD [facility] [phone]

## 2018-09-22 NOTE — Progress Notes (Signed)
Labs reviewed with MD today at office visit, proceed with treatment and note that MD is decreasing dose of oxaliplatin.   Treatment given per orders. Patient tolerated it well without problems. Vitals stable and discharged home from clinic ambulatory. Follow up as scheduled.

## 2018-09-24 ENCOUNTER — Inpatient Hospital Stay (HOSPITAL_COMMUNITY): Payer: BC Managed Care – PPO

## 2018-09-24 ENCOUNTER — Other Ambulatory Visit: Payer: Self-pay

## 2018-09-24 DIAGNOSIS — G629 Polyneuropathy, unspecified: Secondary | ICD-10-CM | POA: Diagnosis not present

## 2018-09-24 DIAGNOSIS — C187 Malignant neoplasm of sigmoid colon: Secondary | ICD-10-CM

## 2018-09-24 DIAGNOSIS — Z5111 Encounter for antineoplastic chemotherapy: Secondary | ICD-10-CM | POA: Diagnosis not present

## 2018-09-24 DIAGNOSIS — Z452 Encounter for adjustment and management of vascular access device: Secondary | ICD-10-CM | POA: Diagnosis not present

## 2018-09-24 MED ORDER — HEPARIN SOD (PORK) LOCK FLUSH 100 UNIT/ML IV SOLN
500.0000 [IU] | Freq: Once | INTRAVENOUS | Status: AC | PRN
  Administered 2018-09-24: 500 [IU]

## 2018-09-24 MED ORDER — PEGFILGRASTIM INJECTION 6 MG/0.6ML ~~LOC~~
PREFILLED_SYRINGE | SUBCUTANEOUS | Status: AC
  Filled 2018-09-24: qty 0.6

## 2018-09-24 MED ORDER — SODIUM CHLORIDE 0.9% FLUSH
10.0000 mL | INTRAVENOUS | Status: DC | PRN
  Administered 2018-09-24: 10 mL
  Filled 2018-09-24: qty 10

## 2018-09-24 NOTE — Patient Instructions (Signed)
[facility] _______________________________________________________________  Thank you for choosing [facility] at [facility] to provide your oncology and hematology care.  To afford each patient quality time with our providers, please arrive at least 15 minutes before your scheduled appointment.  You need to re-schedule your appointment if you arrive 10 or more minutes late.  We strive to give you quality time with our providers, and arriving late affects you and other patients whose appointments are after yours.  Also, if you no show three or more times for appointments you may be dismissed from the clinic.  Again, thank you for choosing [facility] at [facility]. Our hope is that these requests will allow you access to exceptional care and in a timely manner. _______________________________________________________________  If you have questions after your visit, please contact our office at [phone] between the hours of 8:30 a.m. and 5:00 p.m. Voicemails left after 4:30 p.m. will not be returned until the following business day. _______________________________________________________________  For prescription refill requests, have your pharmacy contact our office. _______________________________________________________________  Recommendations made by the consultant and any test results will be sent to your referring physician. _______________________________________________________________ 

## 2018-09-30 DIAGNOSIS — C187 Malignant neoplasm of sigmoid colon: Secondary | ICD-10-CM | POA: Diagnosis not present

## 2018-09-30 DIAGNOSIS — R7309 Other abnormal glucose: Secondary | ICD-10-CM | POA: Diagnosis not present

## 2018-09-30 DIAGNOSIS — R51 Headache: Secondary | ICD-10-CM | POA: Diagnosis not present

## 2018-09-30 DIAGNOSIS — I10 Essential (primary) hypertension: Secondary | ICD-10-CM | POA: Diagnosis not present

## 2018-10-06 ENCOUNTER — Inpatient Hospital Stay (HOSPITAL_COMMUNITY): Payer: BC Managed Care – PPO

## 2018-10-06 ENCOUNTER — Encounter (HOSPITAL_COMMUNITY): Payer: Self-pay | Admitting: Hematology

## 2018-10-06 ENCOUNTER — Inpatient Hospital Stay (HOSPITAL_BASED_OUTPATIENT_CLINIC_OR_DEPARTMENT_OTHER): Payer: BC Managed Care – PPO | Admitting: Hematology

## 2018-10-06 ENCOUNTER — Other Ambulatory Visit: Payer: Self-pay

## 2018-10-06 VITALS — BP 132/91 | HR 81 | Temp 97.5°F | Resp 18 | Wt 216.5 lb

## 2018-10-06 VITALS — BP 125/88 | HR 80 | Temp 98.0°F | Resp 18

## 2018-10-06 DIAGNOSIS — Z452 Encounter for adjustment and management of vascular access device: Secondary | ICD-10-CM | POA: Diagnosis not present

## 2018-10-06 DIAGNOSIS — G629 Polyneuropathy, unspecified: Secondary | ICD-10-CM

## 2018-10-06 DIAGNOSIS — C187 Malignant neoplasm of sigmoid colon: Secondary | ICD-10-CM

## 2018-10-06 DIAGNOSIS — Z5111 Encounter for antineoplastic chemotherapy: Secondary | ICD-10-CM | POA: Diagnosis not present

## 2018-10-06 LAB — CBC WITH DIFFERENTIAL/PLATELET
Abs Immature Granulocytes: 0.01 10*3/uL (ref 0.00–0.07)
Basophils Absolute: 0 10*3/uL (ref 0.0–0.1)
Basophils Relative: 1 %
Eosinophils Absolute: 0.1 10*3/uL (ref 0.0–0.5)
Eosinophils Relative: 3 %
HCT: 39.2 % (ref 39.0–52.0)
Hemoglobin: 13.3 g/dL (ref 13.0–17.0)
Immature Granulocytes: 0 %
Lymphocytes Relative: 36 %
Lymphs Abs: 1.3 10*3/uL (ref 0.7–4.0)
MCH: 30.9 pg (ref 26.0–34.0)
MCHC: 33.9 g/dL (ref 30.0–36.0)
MCV: 91 fL (ref 80.0–100.0)
Monocytes Absolute: 0.4 10*3/uL (ref 0.1–1.0)
Monocytes Relative: 10 %
Neutro Abs: 1.8 10*3/uL (ref 1.7–7.7)
Neutrophils Relative %: 50 %
Platelets: 125 10*3/uL — ABNORMAL LOW (ref 150–400)
RBC: 4.31 MIL/uL (ref 4.22–5.81)
RDW: 14.4 % (ref 11.5–15.5)
WBC: 3.7 10*3/uL — ABNORMAL LOW (ref 4.0–10.5)
nRBC: 0 % (ref 0.0–0.2)

## 2018-10-06 LAB — COMPREHENSIVE METABOLIC PANEL
ALT: 57 U/L — ABNORMAL HIGH (ref 0–44)
AST: 36 U/L (ref 15–41)
Albumin: 4.3 g/dL (ref 3.5–5.0)
Alkaline Phosphatase: 55 U/L (ref 38–126)
Anion gap: 7 (ref 5–15)
BUN: 14 mg/dL (ref 6–20)
CO2: 23 mmol/L (ref 22–32)
Calcium: 9 mg/dL (ref 8.9–10.3)
Chloride: 111 mmol/L (ref 98–111)
Creatinine, Ser: 1.03 mg/dL (ref 0.61–1.24)
GFR calc Af Amer: >60 mL/min (ref >60)
GFR calc non Af Amer: >60 mL/min (ref >60)
Glucose, Bld: 151 mg/dL — ABNORMAL HIGH (ref 70–99)
Potassium: 4.1 mmol/L (ref 3.5–5.1)
Sodium: 141 mmol/L (ref 135–145)
Total Bilirubin: 0.7 mg/dL (ref 0.3–1.2)
Total Protein: 6.8 g/dL (ref 6.5–8.1)

## 2018-10-06 MED ORDER — DEXTROSE 5 % IV SOLN
Freq: Once | INTRAVENOUS | Status: AC
  Administered 2018-10-06: 11:00:00 via INTRAVENOUS

## 2018-10-06 MED ORDER — SODIUM CHLORIDE 0.9% FLUSH
10.0000 mL | INTRAVENOUS | Status: DC | PRN
  Administered 2018-10-06: 10 mL
  Filled 2018-10-06: qty 10

## 2018-10-06 MED ORDER — LEUCOVORIN CALCIUM INJECTION 350 MG
400.0000 mg/m2 | Freq: Once | INTRAVENOUS | Status: AC
  Administered 2018-10-06: 876 mg via INTRAVENOUS
  Filled 2018-10-06: qty 43.8

## 2018-10-06 MED ORDER — LORAZEPAM 2 MG/ML IJ SOLN
1.0000 mg | Freq: Once | INTRAMUSCULAR | Status: AC
  Administered 2018-10-06: 1 mg via INTRAVENOUS
  Filled 2018-10-06: qty 1

## 2018-10-06 MED ORDER — PALONOSETRON HCL INJECTION 0.25 MG/5ML
0.2500 mg | Freq: Once | INTRAVENOUS | Status: AC
  Administered 2018-10-06: 0.25 mg via INTRAVENOUS
  Filled 2018-10-06: qty 5

## 2018-10-06 MED ORDER — FLUOROURACIL CHEMO INJECTION 2.5 GM/50ML
400.0000 mg/m2 | Freq: Once | INTRAVENOUS | Status: AC
  Administered 2018-10-06: 900 mg via INTRAVENOUS
  Filled 2018-10-06: qty 18

## 2018-10-06 MED ORDER — DEXTROSE 5 % IV SOLN
Freq: Once | INTRAVENOUS | Status: AC
  Administered 2018-10-06: 10:00:00 via INTRAVENOUS

## 2018-10-06 MED ORDER — OXALIPLATIN CHEMO INJECTION 100 MG/20ML
42.5000 mg/m2 | Freq: Once | INTRAVENOUS | Status: AC
  Administered 2018-10-06: 95 mg via INTRAVENOUS
  Filled 2018-10-06: qty 19

## 2018-10-06 MED ORDER — SODIUM CHLORIDE 0.9 % IV SOLN
Freq: Once | INTRAVENOUS | Status: AC
  Administered 2018-10-06: 11:00:00 via INTRAVENOUS
  Filled 2018-10-06: qty 5

## 2018-10-06 MED ORDER — SODIUM CHLORIDE 0.9 % IV SOLN
2400.0000 mg/m2 | INTRAVENOUS | Status: DC
  Administered 2018-10-06: 5250 mg via INTRAVENOUS
  Filled 2018-10-06: qty 100

## 2018-10-06 NOTE — Progress Notes (Signed)
0930 Labs reviewed with and pt seen by Dr. Delton Coombes and pt approved for chemo tx today with dose reduction per MD                                                                                            Joel Jefferson. tolerated chemo tx well without complaints or incident. Pt discharged with 5FU pump infusing without issues. VSS upon discharge. Pt discharged self ambulatory in satisfactory condition

## 2018-10-06 NOTE — Assessment & Plan Note (Signed)
1.  Stage III sigmoid colon adenocarcinoma: - Patient had left lower quadrant abdominal pain since October 2019, CT scan done at [facility] of the abdomen and pelvis showed sigmoid colon thickening. - He had a colonoscopy on 02/24/2018 showing a fungating infiltrative and polypoid partially obstructing large mass in the sigmoid colon between 20 to 25 cm from the anal verge.  This was consistent with colon cancer. - CT of the chest on 02/26/2018 shows 2 small pulmonary nodules, 7 x 5 mm subpleural nodule in the right major fissure and a 4 mm left lower lobe nodule.  Mild hepatic steatosis was seen. - He had a sigmoid colon segmental resection on 03/02/2018 by Dr. Arnoldo Morale.  This showed invasive adenocarcinoma, well-differentiated, 3.8 cm, tumor invades through muscularis propria, resection margins neagtive, 14/35 lymph nodes postive, satellite nodules x3, PT 3, MSI stable, PN2B, MMR preserved. - PET\CT scan on 03/30/2018 showed bilateral hypermetabolic pelvic sidewall lymph nodes, subcentimeter, not in the initial distribution pattern for sigmoid primary.  Lymph nodes are in the bilateral external iliac and the right inguinal lymph node.  A pelvic CT in 3 months is recommended.  Given a normal CEA level, they are more likely reactive. - 11 cycles of FOLFOX from 04/21/2018 through 09/22/2018.  Oxaliplatin was dose reduced to 68 mg per metered square on cycle 11. - He tolerated last cycle very well.  He had occasional nausea. - He will proceed with cycle 12 today. -I will see him back in 3 weeks for follow-up.  I plan to repeat CT of the abdomen and pelvis and a CEA level prior to next visit.  We will discuss surveillance plan at that time.  2.  Peripheral neuropathy: - He had developed on and off numbness in the feet after cycle 10. -Oxaliplatin dose reduced by 20% during cycle 11. -His neuropathy has gotten slightly worse.  He dropped his water bottle few times. -I will cut back on oxaliplatin dose to 50%  during cycle 12.

## 2018-10-06 NOTE — Progress Notes (Signed)
Joel Jefferson, Joel Jefferson   CLINIC:  Medical Oncology/Hematology  PCP:  Sharilyn Sites, [place] [address] [phone]   REASON FOR VISIT:  Follow-up for stage III colon cancer  CURRENT THERAPY:Folfox every 2 weeks   BRIEF ONCOLOGIC HISTORY:  Oncology History  Malignant neoplasm of sigmoid colon [facility])   Initial Diagnosis   Cancer of sigmoid colon ([place])   03/27/2018 Genetic Testing   ALK c.350C>G VUS identified on the multicancer panel.  The Multi-Gene Panel offered by [facility] includes sequencing and/or deletion duplication testing of the following 85 genes: AIP, ALK, APC, ATM, AXIN2,BAP1,  BARD1, BLM, BMPR1A, BRCA1, BRCA2, BRIP1, CASR, CDC73, CDH1, CDK4, CDKN1B, CDKN1C, CDKN2A (p14ARF), CDKN2A (p16INK4a), CEBPA, CHEK2, CTNNA1, DICER1, DIS3L2, EGFR (c.2369C>T, p.Thr790Met variant only), EPCAM (Deletion/duplication testing only), FH, FLCN, GATA2, GPC3, GREM1 (Promoter region deletion/duplication testing only), HOXB13 (c.251G>A, p.Gly84Glu), HRAS, KIT, MAX, MEN1, MET, MITF (c.952G>A, p.Glu318Lys variant only), MLH1, MSH2, MSH3, MSH6, MUTYH, NBN, NF1, NF2, NTHL1, PALB2, PDGFRA, PHOX2B, PMS2, POLD1, POLE, POT1, PRKAR1A, PTCH1, PTEN, RAD50, RAD51C, RAD51D, RB1, RECQL4, RET, RNF43, RUNX1, SDHAF2, SDHA (sequence changes only), SDHB, SDHC, SDHD, SMAD4, SMARCA4, SMARCB1, SMARCE1, STK11, SUFU, TERC, TERT, TMEM127, TP53, TSC1, TSC2, VHL, WRN and WT1.   The report date is 03/27/2018.   04/21/2018 -  Chemotherapy   The patient had palonosetron (ALOXI) injection 0.25 mg, 0.25 mg, Intravenous,  Once, 13 of 13 cycles Administration: 0.25 mg (04/21/2018), 0.25 mg (05/05/2018), 0.25 mg (05/19/2018), 0.25 mg (06/02/2018), 0.25 mg (06/17/2018), 0.25 mg (06/30/2018), 0.25 mg (07/13/2018), 0.25 mg (08/11/2018), 0.25 mg (08/25/2018), 0.25 mg (09/08/2018), 0.25 mg (09/22/2018), 0.25 mg (10/06/2018) leucovorin 800 mg in dextrose 5 % 250 mL infusion, 876 mg,  Intravenous,  Once, 13 of 13 cycles Administration: 800 mg (04/21/2018), 900 mg (05/05/2018), 800 mg (05/19/2018), 800 mg (06/02/2018), 900 mg (06/17/2018), 900 mg (06/30/2018), 900 mg (07/13/2018), 900 mg (08/11/2018), 900 mg (08/25/2018), 876 mg (09/22/2018), 900 mg (09/08/2018), 876 mg (10/06/2018) oxaliplatin (ELOXATIN) 185 mg in dextrose 5 % 500 mL chemo infusion, 85 mg/m2 = 185 mg, Intravenous,  Once, 13 of 13 cycles Dose modification: 68 mg/m2 (80 % of original dose 85 mg/m2, Cycle 12, Reason: Other (see comments), Comment: neuropathy), 42.5 mg/m2 (50 % of original dose 85 mg/m2, Cycle 13, Reason: Provider Judgment) Administration: 185 mg (04/21/2018), 185 mg (05/05/2018), 185 mg (05/19/2018), 185 mg (06/02/2018), 185 mg (06/17/2018), 185 mg (06/30/2018), 185 mg (07/13/2018), 185 mg (08/11/2018), 185 mg (08/25/2018), 185 mg (09/08/2018), 150 mg (09/22/2018), 95 mg (10/06/2018) fluorouracil (ADRUCIL) chemo injection 900 mg, 400 mg/m2 = 900 mg, Intravenous,  Once, 13 of 13 cycles Administration: 900 mg (04/21/2018), 900 mg (05/05/2018), 900 mg (05/19/2018), 900 mg (06/02/2018), 900 mg (06/17/2018), 900 mg (06/30/2018), 900 mg (07/13/2018), 900 mg (08/11/2018), 900 mg (08/25/2018), 900 mg (09/08/2018), 900 mg (09/22/2018) fosaprepitant (EMEND) 150 mg, dexamethasone (DECADRON) 12 mg in sodium chloride 0.9 % 145 mL IVPB, , Intravenous,  Once, 10 of 10 cycles Administration:  (06/02/2018),  (06/17/2018),  (06/30/2018),  (07/13/2018),  (08/11/2018),  (08/25/2018),  (09/08/2018),  (09/22/2018),  (10/06/2018) fluorouracil (ADRUCIL) 5,250 mg in sodium chloride 0.9 % 145 mL chemo infusion, 2,400 mg/m2 = 5,250 mg, Intravenous, 1 Day/Dose, 13 of 13 cycles Administration: 5,250 mg (04/21/2018), 5,250 mg (05/05/2018), 5,250 mg (05/19/2018), 5,250 mg (06/02/2018), 5,250 mg (06/17/2018), 5,250 mg (06/30/2018), 5,250 mg (07/13/2018), 5,250 mg (08/11/2018), 5,250 mg (08/25/2018), 5,250 mg (09/08/2018), 5,250 mg (09/22/2018)  for chemotherapy treatment.  CANCER  STAGING: Cancer Staging No matching staging information was found for the patient.   INTERVAL HISTORY:  Joel Jefferson 43 y.o. male here for follow-up and for administration of cycle 12 chemotherapy.  His neuropathy in the feet has slightly worsened.  Appetite and energy levels are 100%.  He had mild nausea after last cycle.  Denied any vomiting or diarrhea.  Denied any fevers or infections.  No ER visits or hospitalizations.    REVIEW OF SYSTEMS:  Review of Systems  HENT:   Negative for mouth sores.   Gastrointestinal: Positive for nausea. Negative for constipation and diarrhea.  Neurological: Positive for numbness.  All other systems reviewed and are negative.    PAST MEDICAL/SURGICAL HISTORY:  Past Medical History:  Diagnosis Date  . Family history of breast cancer   . GERD (gastroesophageal reflux disease)   . History of kidney stones   . Hypercholesterolemia   . Hypertension   . Urethral stricture    Past Surgical History:  Procedure Laterality Date  . BALLOON DILATION  04/06/2012   Procedure: BALLOON DILATION;  Surgeon: Reece Packer, MD;  Location: [facility];  Service: Urology;  Laterality: N/A;  . BIOPSY  02/24/2018   Procedure: BIOPSY;  Surgeon: Danie Binder, MD;  [facility];  Service: Endoscopy;;  colon  . COLONOSCOPY WITH PROPOFOL N/A 02/24/2018   Procedure: COLONOSCOPY WITH PROPOFOL;  Surgeon: Danie Binder, MD;  Location[facility];  Service: Endoscopy;  Laterality: N/A;  9:30am  . ESOPHAGOGASTRODUODENOSCOPY (EGD) WITH PROPOFOL N/A 08/29/2015   Dr. Oneida Alar: possible proximal esophageal web s/p dilation, moderate gastritis, negative eosinophilic esophagitis   . PARTIAL COLECTOMY N/A 03/02/2018   Procedure: PARTIAL COLECTOMY;  Surgeon: Aviva Signs, MD;  Location: [facility];  Service: General;  Laterality: N/A;  . PORTACATH PLACEMENT Left 04/13/2018   Procedure: INSERTION PORT-A-CATH;  Surgeon: Aviva Signs, MD;  Location: [facility];  Service: General;  Laterality: Left;  . SAVORY DILATION N/A 08/29/2015   Procedure: SAVORY DILATION;  Surgeon: Danie Binder, MD;  [facility];  Service: Endoscopy;  Laterality: N/A;  . URETHRAL DILATION    . WISDOM TOOTH EXTRACTION       SOCIAL HISTORY:  Social History   Socioeconomic History  . Marital status: Divorced    Spouse name: Not on file  . Number of children: Not on file  . Years of education: Not on file  . Highest education level: Not on file  Occupational History  . Not on file  Social Needs  . Financial resource strain: Not on file  . Food insecurity    Worry: Not on file    Inability: Not on file  . Transportation needs    Medical: Not on file    Non-medical: Not on file  Tobacco Use  . Smoking status: Former Smoker    Packs/day: 0.25    Years: 9.00    Pack years: 2.25    Types: Cigarettes    Quit date: 07/27/2011    Years since quitting: 7.2  . Smokeless tobacco: Never Used  Substance and Sexual Activity  . Alcohol use: Yes    Alcohol/week: 6.0 standard drinks    Types: 6 Cans of beer per week  . Drug use: No  . Sexual activity: Yes    Birth control/protection: None  Lifestyle  . Physical activity    Days per week: Not on file    Minutes per session: Not on file  .  Stress: Not on file  Relationships  . Social Herbalist on phone: Not on file    Gets together: Not on file    Attends religious service: Not on file    Active member of club or organization: Not on file    Attends meetings of clubs or organizations: Not on file    Relationship status: Not on file  . Intimate partner violence    Fear of current or ex partner: Not on file    Emotionally abused: Not on file    Physically abused: Not on file    Forced sexual activity: Not on file  Other Topics Concern  . Not on file  Social History Narrative  . Not on file    FAMILY HISTORY:  Family History  Problem Relation Age of Onset  . Dementia Mother   .  Dementia Maternal Aunt   . Dementia Maternal Grandmother   . COPD Maternal Grandfather   . Breast cancer Maternal Aunt 42  . Breast cancer Maternal Aunt        dx in her 52s  . Lung cancer Maternal Aunt   . Healthy Son   . Colon cancer Neg Hx     CURRENT MEDICATIONS:  Outpatient Encounter Medications as of 10/06/2018  Medication Sig  . Ascorbic Acid (VITAMIN C) 1000 MG tablet Take 1,000 mg by mouth daily.  . CONTOUR NEXT TEST test strip USE TO CHECK BLOOD SUGAR BID  . esomeprazole (NEXIUM) 40 MG capsule Take 40 mg by mouth daily.   . fluorouracil CALGB 75051 in sodium chloride 0.9 % 150 mL Inject 6,150 mg into the vein every 14 (fourteen) days. Over 46 hours  . glipiZIDE (GLIPIZIDE XL) 5 MG 24 hr tablet Take 1 tablet (5 mg total) by mouth daily with breakfast.  . LEUCOVORIN CALCIUM IV Inject 876 mg into the vein every 14 (fourteen) days.  Marland Kitchen lidocaine-prilocaine (EMLA) cream Apply small amount to port site one hour prior to appointment. Cover with plastic wrap  . lisinopril (PRINIVIL,ZESTRIL) 10 MG tablet Take 10 mg by mouth daily.   Marland Kitchen LORazepam (ATIVAN) 0.5 MG tablet Take 1 tablet (0.5 mg total) by mouth every 8 (eight) hours. (Patient taking differently: Take 0.5 mg by mouth every 8 (eight) hours as needed. )  . Magnesium 500 MG CAPS Take 500 mg by mouth daily.   . Multiple Vitamin (MULTIVITAMIN) tablet Take 1 tablet by mouth daily.  . ondansetron (ZOFRAN) 8 MG tablet Take 1 tablet (8 mg total) by mouth every 8 (eight) hours as needed for nausea or vomiting (may start taking as needed two days after chemotherapy).  . OXALIPLATIN IV Inject 185 mg into the vein every 14 (fourteen) days.  . Potassium (POTASSIMIN PO) Take 1 tablet by mouth daily.   . pravastatin (PRAVACHOL) 20 MG tablet Take 20 mg by mouth daily.   . promethazine (PHENERGAN) 25 MG tablet Take 1 tablet (25 mg total) by mouth every 6 (six) hours as needed for nausea or vomiting.  . topiramate (TOPAMAX) 100 MG tablet Take 100  mg by mouth 2 (two) times daily.   . [DISCONTINUED] lidocaine (XYLOCAINE) 2 % solution Use as directed 15 mLs in the mouth or throat 2 (two) times daily.   Facility-Administered Encounter Medications as of 10/06/2018  Medication  . sodium chloride flush (NS) 0.9 % injection 10 mL    ALLERGIES:  No Known Allergies   PHYSICAL EXAM:  ECOG Performance status: 1  Vitals:  10/06/18 0821  BP: (!) 132/91  Pulse: 81  Resp: 18  Temp: (!) 97.5 F (36.4 C)  SpO2: 99%   Filed Weights   10/06/18 0821  Weight: 216 lb 8 oz (98.2 kg)    Physical Exam Vitals signs reviewed.  Constitutional:      Appearance: Normal appearance.  Cardiovascular:     Rate and Rhythm: Normal rate and regular rhythm.     Heart sounds: Normal heart sounds.  Pulmonary:     Effort: Pulmonary effort is normal.     Breath sounds: Normal breath sounds.  Abdominal:     General: There is no distension.     Palpations: Abdomen is soft. There is no mass.  Musculoskeletal:        General: No swelling.  Skin:    General: Skin is warm.  Neurological:     General: No focal deficit present.     Mental Status: He is alert and oriented to person, place, and time.  Psychiatric:        Mood and Affect: Mood normal.        Behavior: Behavior normal.      LABORATORY DATA:  I have reviewed the labs as listed.  CBC    Component Value Date/Time   WBC 3.7 (L) 10/06/2018 0806   RBC 4.31 10/06/2018 0806   HGB 13.3 10/06/2018 0806   HCT 39.2 10/06/2018 0806   PLT 125 (L) 10/06/2018 0806   MCV 91.0 10/06/2018 0806   MCH 30.9 10/06/2018 0806   MCHC 33.9 10/06/2018 0806   RDW 14.4 10/06/2018 0806   LYMPHSABS 1.3 10/06/2018 0806   MONOABS 0.4 10/06/2018 0806   EOSABS 0.1 10/06/2018 0806   BASOSABS 0.0 10/06/2018 0806   CMP Latest Ref Rng & Units 10/06/2018 09/22/2018 09/08/2018  Glucose 70 - 99 mg/dL 151(H) 220(H) 204(H)  BUN 6 - 20 mg/dL _0 Creatinine 0.61 - 1.24 mg/dL 1.03 0.92 0.81  Sodium 135 - 145  mmol/L 141 140 140  Potassium 3.5 - 5.1 mmol/L 4.1 3.8 4.0  Chloride 98 - 111 mmol/L 111 111 112(H)  CO2 22 - 32 mmol/L 23 18(L) 21(L)  Calcium 8.9 - 10.3 mg/dL 9.0 8.7(L) 8.9  Total Protein 6.5 - 8.1 g/dL 6.8 6.3(L) 6.2(L)  Total Bilirubin 0.3 - 1.2 mg/dL 0.7 0.9 0.4  Alkaline Phos 38 - 126 U/L 55 51 65  AST 15 - 41 U/L 36 39 32  ALT 0 - 44 U/L 57(H) 61(H) 58(H)       DIAGNOSTIC IMAGING:  I have independently reviewed the scans and discussed with the patient.      ASSESSMENT & PLAN:   Malignant neoplasm of sigmoid colon ([place]) 1.  Stage III sigmoid colon adenocarcinoma: - Patient had left lower quadrant abdominal pain since October 2019, CT scan done at [facility] of the abdomen and pelvis showed sigmoid colon thickening. - He had a colonoscopy on 02/24/2018 showing a fungating infiltrative and polypoid partially obstructing large mass in the sigmoid colon between 20 to 25 cm from the anal verge.  This was consistent with colon cancer. - CT of the chest on 02/26/2018 shows 2 small pulmonary nodules, 7 x 5 mm subpleural nodule in the right major fissure and a 4 mm left lower lobe nodule.  Mild hepatic steatosis was seen. - He had a sigmoid colon segmental resection on 03/02/2018 by Dr. Arnoldo Morale.  This showed invasive adenocarcinoma, well-differentiated, 3.8 cm, tumor invades through muscularis propria, resection margins neagtive,  14/35 lymph nodes postive, satellite nodules x3, PT 3, MSI stable, PN2B, MMR preserved. - PET\CT scan on 03/30/2018 showed bilateral hypermetabolic pelvic sidewall lymph nodes, subcentimeter, not in the initial distribution pattern for sigmoid primary.  Lymph nodes are in the bilateral external iliac and the right inguinal lymph node.  A pelvic CT in 3 months is recommended.  Given a normal CEA level, they are more likely reactive. - 11 cycles of FOLFOX from 04/21/2018 through 09/22/2018.  Oxaliplatin was dose reduced to 68 mg per metered square on cycle 11. - He  tolerated last cycle very well.  He had occasional nausea. - He will proceed with cycle 12 today. -I will see him back in 3 weeks for follow-up.  I plan to repeat CT of the abdomen and pelvis and a CEA level prior to next visit.  We will discuss surveillance plan at that time.  2.  Peripheral neuropathy: - He had developed on and off numbness in the feet after cycle 10. -Oxaliplatin dose reduced by 20% during cycle 11. -His neuropathy has gotten slightly worse.  He dropped his water bottle few times. -I will cut back on oxaliplatin dose to 50% during cycle 12.   Total time spent is 25 minutes with more than 50% of the time spent face-to-face discussing dose reduction, treatment plan and coordination of care.    Orders placed this encounter:  Orders Placed This Encounter  Procedures  . CT Abdomen Pelvis W Contrast  . CEA      Derek Jack, MD [place] [phone]

## 2018-10-06 NOTE — Patient Instructions (Signed)
[facility] Discharge Instructions for Patients Receiving Chemotherapy   Beginning January 23rd 2017 lab work for the Cancer Center will be done in the  Main lab at [facility] on 1st floor. If you have a lab appointment with the Cancer Center please come in thru the  Main Entrance and check in at the main information desk   Today you received the following chemotherapy agents Oxaliplatin,Luecovorin and 5FU. Follow-up as scheduled. Call clinic for any questions or concerns  To help prevent nausea and vomiting after your treatment, we encourage you to take your nausea medication.   If you develop nausea and vomiting, or diarrhea that is not controlled by your medication, call the clinic.  The clinic phone number is [phone]. Office hours are Monday-Friday 8:30am-5:00pm.  BELOW ARE SYMPTOMS THAT SHOULD BE REPORTED IMMEDIATELY:  *FEVER GREATER THAN 101.0 F  *CHILLS WITH OR WITHOUT FEVER  NAUSEA AND VOMITING THAT IS NOT CONTROLLED WITH YOUR NAUSEA MEDICATION  *UNUSUAL SHORTNESS OF BREATH  *UNUSUAL BRUISING OR BLEEDING  TENDERNESS IN MOUTH AND THROAT WITH OR WITHOUT PRESENCE OF ULCERS  *URINARY PROBLEMS  *BOWEL PROBLEMS  UNUSUAL RASH Items with * indicate a potential emergency and should be followed up as soon as possible. If you have an emergency after office hours please contact your primary care physician or go to the nearest emergency department.  Please call the clinic during office hours if you have any questions or concerns.   You may also contact the Patient Navigator at [phone] should you have any questions or need assistance in obtaining follow up care.      Resources For Cancer Patients and their Caregivers ? American Cancer Society: Can assist with transportation, wigs, general needs, runs Look Good Feel Better.        1-888-227-6333 ? Cancer Care: Provides financial assistance, online support groups, medication/co-pay assistance.   1-800-813-HOPE (4673) ? [facility] Assists [place] cancer patients and their families through emotional , educational and financial support.  336-427-4357 ? [place] DSS Where to apply for food stamps, Medicaid and utility assistance. 336-342-1394 ? [facility]: Transportation to medical appointments. 336-347-2287 ? Social Security Administration: May apply for disability if have a Stage IV cancer. 336-342-7796 1-800-772-1213 ? [place] Aging, Disability and Transit Services: Assists with nutrition, care and transit needs. 336-349-2343         

## 2018-10-06 NOTE — Patient Instructions (Addendum)
[facility] at [facility] Discharge Instructions  You were seen today by Dr. Katragadda. He went over your recent lab results. He will see you back in 3 weeks for labs and follow up.   Thank you for choosing [facility] at [facility] to provide your oncology and hematology care.  To afford each patient quality time with our provider, please arrive at least 15 minutes before your scheduled appointment time.   If you have a lab appointment with the Cancer Center please come in thru the  Main Entrance and check in at the main information desk  You need to re-schedule your appointment should you arrive 10 or more minutes late.  We strive to give you quality time with our providers, and arriving late affects you and other patients whose appointments are after yours.  Also, if you no show three or more times for appointments you may be dismissed from the clinic at the providers discretion.     Again, thank you for choosing [facility].  Our hope is that these requests will decrease the amount of time that you wait before being seen by our physicians.       _____________________________________________________________  Should you have questions after your visit to [facility], please contact our office at [phone] between the hours of 8:00 a.m. and 4:30 p.m.  Voicemails left after 4:00 p.m. will not be returned until the following business day.  For prescription refill requests, have your pharmacy contact our office and allow 72 hours.    Cancer Center Support Programs:   > Cancer Support Group  2nd Tuesday of the month 1pm-2pm, Journey Room    

## 2018-10-08 ENCOUNTER — Other Ambulatory Visit: Payer: Self-pay

## 2018-10-08 ENCOUNTER — Inpatient Hospital Stay (HOSPITAL_COMMUNITY): Payer: BC Managed Care – PPO | Attending: Hematology

## 2018-10-08 VITALS — BP 120/73 | HR 71 | Temp 98.4°F | Resp 18

## 2018-10-08 DIAGNOSIS — Z452 Encounter for adjustment and management of vascular access device: Secondary | ICD-10-CM | POA: Insufficient documentation

## 2018-10-08 DIAGNOSIS — C187 Malignant neoplasm of sigmoid colon: Secondary | ICD-10-CM | POA: Diagnosis not present

## 2018-10-08 DIAGNOSIS — G629 Polyneuropathy, unspecified: Secondary | ICD-10-CM | POA: Diagnosis not present

## 2018-10-08 MED ORDER — HEPARIN SOD (PORK) LOCK FLUSH 100 UNIT/ML IV SOLN
500.0000 [IU] | Freq: Once | INTRAVENOUS | Status: AC | PRN
  Administered 2018-10-08: 500 [IU]

## 2018-10-08 MED ORDER — SODIUM CHLORIDE 0.9% FLUSH
10.0000 mL | INTRAVENOUS | Status: DC | PRN
  Administered 2018-10-08: 10 mL
  Filled 2018-10-08: qty 10

## 2018-10-23 ENCOUNTER — Ambulatory Visit (HOSPITAL_COMMUNITY)
Admission: RE | Admit: 2018-10-23 | Discharge: 2018-10-23 | Disposition: A | Discharge status: Home / Self Care | Payer: BC Managed Care – PPO | Source: Ambulatory Visit | Attending: Hematology | Admitting: Hematology

## 2018-10-23 ENCOUNTER — Other Ambulatory Visit: Payer: Self-pay

## 2018-10-23 ENCOUNTER — Inpatient Hospital Stay (HOSPITAL_COMMUNITY): Payer: BC Managed Care – PPO

## 2018-10-23 DIAGNOSIS — C187 Malignant neoplasm of sigmoid colon: Secondary | ICD-10-CM | POA: Diagnosis not present

## 2018-10-23 LAB — COMPREHENSIVE METABOLIC PANEL
ALT: 56 U/L — ABNORMAL HIGH (ref 0–44)
AST: 35 U/L (ref 15–41)
Albumin: 4.1 g/dL (ref 3.5–5.0)
Alkaline Phosphatase: 52 U/L (ref 38–126)
Anion gap: 7 (ref 5–15)
BUN: 18 mg/dL (ref 6–20)
CO2: 23 mmol/L (ref 22–32)
Calcium: 9 mg/dL (ref 8.9–10.3)
Chloride: 108 mmol/L (ref 98–111)
Creatinine, Ser: 0.78 mg/dL (ref 0.61–1.24)
GFR calc Af Amer: >60 mL/min (ref >60)
GFR calc non Af Amer: >60 mL/min (ref >60)
Glucose, Bld: 144 mg/dL — ABNORMAL HIGH (ref 70–99)
Potassium: 4.3 mmol/L (ref 3.5–5.1)
Sodium: 138 mmol/L (ref 135–145)
Total Bilirubin: 0.8 mg/dL (ref 0.3–1.2)
Total Protein: 6.6 g/dL (ref 6.5–8.1)

## 2018-10-23 LAB — CBC WITH DIFFERENTIAL/PLATELET
Abs Immature Granulocytes: 0.02 10*3/uL (ref 0.00–0.07)
Basophils Absolute: 0 10*3/uL (ref 0.0–0.1)
Basophils Relative: 1 %
Eosinophils Absolute: 0.1 10*3/uL (ref 0.0–0.5)
Eosinophils Relative: 2 %
HCT: 40.3 % (ref 39.0–52.0)
Hemoglobin: 13.4 g/dL (ref 13.0–17.0)
Immature Granulocytes: 1 %
Lymphocytes Relative: 42 %
Lymphs Abs: 1.4 10*3/uL (ref 0.7–4.0)
MCH: 31 pg (ref 26.0–34.0)
MCHC: 33.3 g/dL (ref 30.0–36.0)
MCV: 93.3 fL (ref 80.0–100.0)
Monocytes Absolute: 0.4 10*3/uL (ref 0.1–1.0)
Monocytes Relative: 12 %
Neutro Abs: 1.5 10*3/uL — ABNORMAL LOW (ref 1.7–7.7)
Neutrophils Relative %: 42 %
Platelets: 167 10*3/uL (ref 150–400)
RBC: 4.32 MIL/uL (ref 4.22–5.81)
RDW: 14.7 % (ref 11.5–15.5)
WBC: 3.4 10*3/uL — ABNORMAL LOW (ref 4.0–10.5)
nRBC: 0 % (ref 0.0–0.2)

## 2018-10-23 MED ORDER — IOHEXOL 300 MG/ML  SOLN
100.0000 mL | Freq: Once | INTRAMUSCULAR | Status: AC | PRN
  Administered 2018-10-23: 75 mL via INTRAVENOUS

## 2018-10-24 LAB — CEA: CEA: 2.9 ng/mL (ref 0.0–4.7)

## 2018-10-27 ENCOUNTER — Ambulatory Visit (HOSPITAL_COMMUNITY): Payer: BC Managed Care – PPO | Admitting: Hematology

## 2018-10-28 ENCOUNTER — Other Ambulatory Visit: Payer: Self-pay

## 2018-10-28 ENCOUNTER — Encounter (HOSPITAL_COMMUNITY): Payer: Self-pay | Admitting: Hematology

## 2018-10-28 ENCOUNTER — Inpatient Hospital Stay (HOSPITAL_BASED_OUTPATIENT_CLINIC_OR_DEPARTMENT_OTHER): Payer: BC Managed Care – PPO | Admitting: Hematology

## 2018-10-28 VITALS — BP 126/87 | HR 84 | Temp 97.9°F | Resp 18 | Wt 222.4 lb

## 2018-10-28 DIAGNOSIS — G629 Polyneuropathy, unspecified: Secondary | ICD-10-CM

## 2018-10-28 DIAGNOSIS — C187 Malignant neoplasm of sigmoid colon: Secondary | ICD-10-CM | POA: Diagnosis not present

## 2018-10-28 NOTE — Assessment & Plan Note (Signed)
1.  Stage III (PT3PN2B) sigmoid colon adenocarcinoma, MSI-stable: - Status post sigmoid colon segmental resection on 03/02/2018 by Dr. Arnoldo Morale, 14/35 lymph nodes positive. - PET CT scan on 03/30/2018 showed bilateral hypermetabolic pelvic sidewall lymph nodes.  There are subcentimeter not in the initial distribution pattern for sigmoid primary.  Lymph nodes are in the bilateral external iliac and right inguinal lymph node. -12 cycles of FOLFOX from 04/21/2018 through 10/06/2018. - CEA on 10/23/2018 was 2.9. - CT scan of the abdomen and pelvis with contrast on 10/23/2018 shows no evidence of recurrent tumor or metastatic disease.  Hepatic steatosis.  No pelvic or inguinal adenopathy. - I will see him back in 3 months for follow-up with a CEA level and LFTs.  I plan to repeat CT scan in 6 months.  2.  Peripheral neuropathy: -He developed constant numbness in the fingertips and toes.  These are not painful at this time. - If they become painful, will consider starting him on gabapentin/duloxetine.

## 2018-10-28 NOTE — Patient Instructions (Signed)
[facility] at [facility] Discharge Instructions  You were seen today by Dr. Katragadda. He went over your recent lab results. He will see you back in 3 months for labs and follow up.   Thank you for choosing [facility] at [facility] to provide your oncology and hematology care.  To afford each patient quality time with our provider, please arrive at least 15 minutes before your scheduled appointment time.   If you have a lab appointment with the Cancer Center please come in thru the  Main Entrance and check in at the main information desk  You need to re-schedule your appointment should you arrive 10 or more minutes late.  We strive to give you quality time with our providers, and arriving late affects you and other patients whose appointments are after yours.  Also, if you no show three or more times for appointments you may be dismissed from the clinic at the providers discretion.     Again, thank you for choosing [facility].  Our hope is that these requests will decrease the amount of time that you wait before being seen by our physicians.       _____________________________________________________________  Should you have questions after your visit to [facility], please contact our office at [phone] between the hours of 8:00 a.m. and 4:30 p.m.  Voicemails left after 4:00 p.m. will not be returned until the following business day.  For prescription refill requests, have your pharmacy contact our office and allow 72 hours.    Cancer Center Support Programs:   > Cancer Support Group  2nd Tuesday of the month 1pm-2pm, Journey Room    

## 2018-10-28 NOTE — Progress Notes (Signed)
Joel Jefferson, [place] [id number]   CLINIC:  Medical Oncology/Hematology  PCP:  Sharilyn Sites, [place] [address] [phone]   REASON FOR VISIT:  Follow-up for stage III colon cancer  CURRENT THERAPY:Completed 12 cycles of FOLFOX.   BRIEF ONCOLOGIC HISTORY:  Oncology History  Malignant neoplasm of sigmoid colon [facility])   Initial Diagnosis   Cancer of sigmoid colon ([place])   03/27/2018 Genetic Testing   ALK c.350C>G VUS identified on the multicancer panel.  The Multi-Gene Panel offered by [facility] includes sequencing and/or deletion duplication testing of the following 85 genes: AIP, ALK, APC, ATM, AXIN2,BAP1,  BARD1, BLM, BMPR1A, BRCA1, BRCA2, BRIP1, CASR, CDC73, CDH1, CDK4, CDKN1B, CDKN1C, CDKN2A (p14ARF), CDKN2A (p16INK4a), CEBPA, CHEK2, CTNNA1, DICER1, DIS3L2, EGFR (c.2369C>T, p.Thr790Met variant only), EPCAM (Deletion/duplication testing only), FH, FLCN, GATA2, GPC3, GREM1 (Promoter region deletion/duplication testing only), HOXB13 (c.251G>A, p.Gly84Glu), HRAS, KIT, MAX, MEN1, MET, MITF (c.952G>A, p.Glu318Lys variant only), MLH1, MSH2, MSH3, MSH6, MUTYH, NBN, NF1, NF2, NTHL1, PALB2, PDGFRA, PHOX2B, PMS2, POLD1, POLE, POT1, PRKAR1A, PTCH1, PTEN, RAD50, RAD51C, RAD51D, RB1, RECQL4, RET, RNF43, RUNX1, SDHAF2, SDHA (sequence changes only), SDHB, SDHC, SDHD, SMAD4, SMARCA4, SMARCB1, SMARCE1, STK11, SUFU, TERC, TERT, TMEM127, TP53, TSC1, TSC2, VHL, WRN and WT1.   The report date is 03/27/2018.   04/21/2018 -  Chemotherapy   The patient had palonosetron (ALOXI) injection 0.25 mg, 0.25 mg, Intravenous,  Once, 13 of 13 cycles Administration: 0.25 mg (04/21/2018), 0.25 mg (05/05/2018), 0.25 mg (05/19/2018), 0.25 mg (06/02/2018), 0.25 mg (06/17/2018), 0.25 mg (06/30/2018), 0.25 mg (07/13/2018), 0.25 mg (08/11/2018), 0.25 mg (08/25/2018), 0.25 mg (09/08/2018), 0.25 mg (09/22/2018), 0.25 mg (10/06/2018) leucovorin 800 mg in dextrose 5 % 250 mL infusion,  876 mg, Intravenous,  Once, 13 of 13 cycles Administration: 800 mg (04/21/2018), 900 mg (05/05/2018), 800 mg (05/19/2018), 800 mg (06/02/2018), 900 mg (06/17/2018), 900 mg (06/30/2018), 900 mg (07/13/2018), 900 mg (08/11/2018), 900 mg (08/25/2018), 876 mg (09/22/2018), 900 mg (09/08/2018), 876 mg (10/06/2018) oxaliplatin (ELOXATIN) 185 mg in dextrose 5 % 500 mL chemo infusion, 85 mg/m2 = 185 mg, Intravenous,  Once, 13 of 13 cycles Dose modification: 68 mg/m2 (80 % of original dose 85 mg/m2, Cycle 12, Reason: Other (see comments), Comment: neuropathy), 42.5 mg/m2 (50 % of original dose 85 mg/m2, Cycle 13, Reason: Provider Judgment) Administration: 185 mg (04/21/2018), 185 mg (05/05/2018), 185 mg (05/19/2018), 185 mg (06/02/2018), 185 mg (06/17/2018), 185 mg (06/30/2018), 185 mg (07/13/2018), 185 mg (08/11/2018), 185 mg (08/25/2018), 185 mg (09/08/2018), 150 mg (09/22/2018), 95 mg (10/06/2018) fluorouracil (ADRUCIL) chemo injection 900 mg, 400 mg/m2 = 900 mg, Intravenous,  Once, 13 of 13 cycles Administration: 900 mg (04/21/2018), 900 mg (05/05/2018), 900 mg (05/19/2018), 900 mg (06/02/2018), 900 mg (06/17/2018), 900 mg (06/30/2018), 900 mg (07/13/2018), 900 mg (08/11/2018), 900 mg (08/25/2018), 900 mg (09/08/2018), 900 mg (09/22/2018), 900 mg (10/06/2018) fosaprepitant (EMEND) 150 mg, dexamethasone (DECADRON) 12 mg in sodium chloride 0.9 % 145 mL IVPB, , Intravenous,  Once, 10 of 10 cycles Administration:  (06/02/2018),  (06/17/2018),  (06/30/2018),  (07/13/2018),  (08/11/2018),  (08/25/2018),  (09/08/2018),  (09/22/2018),  (10/06/2018) fluorouracil (ADRUCIL) 5,250 mg in sodium chloride 0.9 % 145 mL chemo infusion, 2,400 mg/m2 = 5,250 mg, Intravenous, 1 Day/Dose, 13 of 13 cycles Administration: 5,250 mg (04/21/2018), 5,250 mg (05/05/2018), 5,250 mg (05/19/2018), 5,250 mg (06/02/2018), 5,250 mg (06/17/2018), 5,250 mg (06/30/2018), 5,250 mg (07/13/2018), 5,250 mg (08/11/2018), 5,250 mg (08/25/2018), 5,250 mg (09/08/2018), 5,250 mg (09/22/2018), 5,250 mg (10/06/2018)  for  chemotherapy treatment.       CANCER STAGING: Cancer Staging No matching staging information was found for the patient.   INTERVAL HISTORY:  Joel Jefferson 43 y.o. male seen for follow-up of scan results and blood work results.  He completed cycle 12 on 10/06/2018.  Appetite is 100%.  Energy levels are 100%.  Constant numbness in the fingertips and toes has been reported.  Denies any neuropathic pains.  Denies any abdominal pains.  Denies any GI symptoms including nausea vomiting diarrhea or constipation.  Denies any bleeding per rectum or melena.    REVIEW OF SYSTEMS:  Review of Systems  Neurological: Positive for numbness.  All other systems reviewed and are negative.    PAST MEDICAL/SURGICAL HISTORY:  Past Medical History:  Diagnosis Date  . Family history of breast cancer   . GERD (gastroesophageal reflux disease)   . History of kidney stones   . Hypercholesterolemia   . Hypertension   . Urethral stricture    Past Surgical History:  Procedure Laterality Date  . BALLOON DILATION  04/06/2012   Procedure: BALLOON DILATION;  Surgeon: Reece Packer, MD;  Location: [facility];  Service: Urology;  Laterality: N/A;  . BIOPSY  02/24/2018   Procedure: BIOPSY;  Surgeon: Danie Binder, MD;  [facility];  Service: Endoscopy;;  colon  . COLONOSCOPY WITH PROPOFOL N/A 02/24/2018   Procedure: COLONOSCOPY WITH PROPOFOL;  Surgeon: Danie Binder, MD;  [facility];  Service: Endoscopy;  Laterality: N/A;  9:30am  . ESOPHAGOGASTRODUODENOSCOPY (EGD) WITH PROPOFOL N/A 08/29/2015   Dr. Oneida Alar: possible proximal esophageal web s/p dilation, moderate gastritis, negative eosinophilic esophagitis   . PARTIAL COLECTOMY N/A 03/02/2018   Procedure: PARTIAL COLECTOMY;  Surgeon: Aviva Signs, MD;  Location: [facility];  Service: General;  Laterality: N/A;  . PORTACATH PLACEMENT Left 04/13/2018   Procedure: INSERTION PORT-A-CATH;  Surgeon: Aviva Signs, MD;   Location: [facility];  Service: General;  Laterality: Left;  . SAVORY DILATION N/A 08/29/2015   Procedure: SAVORY DILATION;  Surgeon: Danie Binder, MD;  [facility];  Service: Endoscopy;  Laterality: N/A;  . URETHRAL DILATION    . WISDOM TOOTH EXTRACTION       SOCIAL HISTORY:  Social History   Socioeconomic History  . Marital status: Divorced    Spouse name: Not on file  . Number of children: Not on file  . Years of education: Not on file  . Highest education level: Not on file  Occupational History  . Not on file  Social Needs  . Financial resource strain: Not on file  . Food insecurity    Worry: Not on file    Inability: Not on file  . Transportation needs    Medical: Not on file    Non-medical: Not on file  Tobacco Use  . Smoking status: Former Smoker    Packs/day: 0.25    Years: 9.00    Pack years: 2.25    Types: Cigarettes    Quit date: 07/27/2011    Years since quitting: 7.2  . Smokeless tobacco: Never Used  Substance and Sexual Activity  . Alcohol use: Yes    Alcohol/week: 6.0 standard drinks    Types: 6 Cans of beer per week  . Drug use: No  . Sexual activity: Yes    Birth control/protection: None  Lifestyle  . Physical activity    Days per week: Not on file    Minutes per  session: Not on file  . Stress: Not on file  Relationships  . Social Herbalist on phone: Not on file    Gets together: Not on file    Attends religious service: Not on file    Active member of club or organization: Not on file    Attends meetings of clubs or organizations: Not on file    Relationship status: Not on file  . Intimate partner violence    Fear of current or ex partner: Not on file    Emotionally abused: Not on file    Physically abused: Not on file    Forced sexual activity: Not on file  Other Topics Concern  . Not on file  Social History Narrative  . Not on file    FAMILY HISTORY:  Family History  Problem Relation Age of Onset  . Dementia  Mother   . Dementia Maternal Aunt   . Dementia Maternal Grandmother   . COPD Maternal Grandfather   . Breast cancer Maternal Aunt 42  . Breast cancer Maternal Aunt        dx in her 70s  . Lung cancer Maternal Aunt   . Healthy Son   . Colon cancer Neg Hx     CURRENT MEDICATIONS:  Outpatient Encounter Medications as of 10/28/2018  Medication Sig  . Ascorbic Acid (VITAMIN C) 1000 MG tablet Take 1,000 mg by mouth daily.  . CONTOUR NEXT TEST test strip USE TO CHECK BLOOD SUGAR BID  . esomeprazole (NEXIUM) 40 MG capsule Take 40 mg by mouth daily.   Marland Kitchen glipiZIDE (GLIPIZIDE XL) 5 MG 24 hr tablet Take 1 tablet (5 mg total) by mouth daily with breakfast.  . lidocaine-prilocaine (EMLA) cream Apply small amount to port site one hour prior to appointment. Cover with plastic wrap  . lisinopril (PRINIVIL,ZESTRIL) 10 MG tablet Take 10 mg by mouth daily.   . Magnesium 500 MG CAPS Take 500 mg by mouth daily.   . Multiple Vitamin (MULTIVITAMIN) tablet Take 1 tablet by mouth daily.  . Potassium (POTASSIMIN PO) Take 1 tablet by mouth daily.   . pravastatin (PRAVACHOL) 20 MG tablet Take 20 mg by mouth daily.   Marland Kitchen topiramate (TOPAMAX) 100 MG tablet Take 100 mg by mouth 2 (two) times daily.   . fluorouracil CALGB 91791 in sodium chloride 0.9 % 150 mL Inject 6,150 mg into the vein every 14 (fourteen) days. Over 46 hours  . LEUCOVORIN CALCIUM IV Inject 876 mg into the vein every 14 (fourteen) days.  Marland Kitchen LORazepam (ATIVAN) 0.5 MG tablet Take 1 tablet (0.5 mg total) by mouth every 8 (eight) hours. (Patient not taking: Reported on 10/28/2018)  . ondansetron (ZOFRAN) 8 MG tablet Take 1 tablet (8 mg total) by mouth every 8 (eight) hours as needed for nausea or vomiting (may start taking as needed two days after chemotherapy). (Patient not taking: Reported on 10/28/2018)  . OXALIPLATIN IV Inject 185 mg into the vein every 14 (fourteen) days.  . promethazine (PHENERGAN) 25 MG tablet Take 1 tablet (25 mg total) by mouth every  6 (six) hours as needed for nausea or vomiting. (Patient not taking: Reported on 10/28/2018)   Facility-Administered Encounter Medications as of 10/28/2018  Medication  . sodium chloride flush (NS) 0.9 % injection 10 mL    ALLERGIES:  No Known Allergies   PHYSICAL EXAM:  ECOG Performance status: 1  Vitals:   10/28/18 1016  BP: 126/87  Pulse: 84  Resp: 18  Temp: 97.9 F (36.6 C)  SpO2: 100%   Filed Weights   10/28/18 1016  Weight: 222 lb 6.4 oz (100.9 kg)    Physical Exam Vitals signs reviewed.  Constitutional:      Appearance: Normal appearance.  Cardiovascular:     Rate and Rhythm: Normal rate and regular rhythm.     Heart sounds: Normal heart sounds.  Pulmonary:     Effort: Pulmonary effort is normal.     Breath sounds: Normal breath sounds.  Abdominal:     General: There is no distension.     Palpations: Abdomen is soft. There is no mass.  Musculoskeletal:        General: No swelling.  Skin:    General: Skin is warm.  Neurological:     General: No focal deficit present.     Mental Status: He is alert and oriented to person, place, and time.  Psychiatric:        Mood and Affect: Mood normal.        Behavior: Behavior normal.      LABORATORY DATA:  I have reviewed the labs as listed.  CBC    Component Value Date/Time   WBC 3.4 (L) 10/23/2018 1001   RBC 4.32 10/23/2018 1001   HGB 13.4 10/23/2018 1001   HCT 40.3 10/23/2018 1001   PLT 167 10/23/2018 1001   MCV 93.3 10/23/2018 1001   MCH 31.0 10/23/2018 1001   MCHC 33.3 10/23/2018 1001   RDW 14.7 10/23/2018 1001   LYMPHSABS 1.4 10/23/2018 1001   MONOABS 0.4 10/23/2018 1001   EOSABS 0.1 10/23/2018 1001   BASOSABS 0.0 10/23/2018 1001   CMP Latest Ref Rng & Units 10/23/2018 10/06/2018 09/22/2018  Glucose 70 - 99 mg/dL 144(H) 151(H) 220(H)  BUN 6 - 20 mg/dL '18 14 13  ' Creatinine 0.61 - 1.24 mg/dL 0.78 1.03 0.92  Sodium 135 - 145 mmol/L 138 141 140  Potassium 3.5 - 5.1 mmol/L 4.3 4.1 3.8  Chloride 98  - 111 mmol/L 108 111 111  CO2 22 - 32 mmol/L 23 23 18(L)  Calcium 8.9 - 10.3 mg/dL 9.0 9.0 8.7(L)  Total Protein 6.5 - 8.1 g/dL 6.6 6.8 6.3(L)  Total Bilirubin 0.3 - 1.2 mg/dL 0.8 0.7 0.9  Alkaline Phos 38 - 126 U/L 52 55 51  AST 15 - 41 U/L 35 36 39  ALT 0 - 44 U/L 56(H) 57(H) 61(H)       DIAGNOSTIC IMAGING:  I have independently reviewed the scans and discussed with the patient.      ASSESSMENT & PLAN:   Malignant neoplasm of sigmoid colon ([place]) 1.  Stage III (PT3PN2B) sigmoid colon adenocarcinoma, MSI-stable: - Status post sigmoid colon segmental resection on 03/02/2018 by Dr. Arnoldo Morale, 14/35 lymph nodes positive. - PET CT scan on 03/30/2018 showed bilateral hypermetabolic pelvic sidewall lymph nodes.  There are subcentimeter not in the initial distribution pattern for sigmoid primary.  Lymph nodes are in the bilateral external iliac and right inguinal lymph node. -12 cycles of FOLFOX from 04/21/2018 through 10/06/2018. - CEA on 10/23/2018 was 2.9. - CT scan of the abdomen and pelvis with contrast on 10/23/2018 shows no evidence of recurrent tumor or metastatic disease.  Hepatic steatosis.  No pelvic or inguinal adenopathy. - I will see him back in 3 months for follow-up with a CEA level and LFTs.  I plan to repeat CT scan in 6 months.  2.  Peripheral neuropathy: -He developed constant numbness in the fingertips and toes.  These are not painful at this time. - If they become painful, will consider starting him on gabapentin/duloxetine.  Total time spent is 25 minutes with more than 50% of the time spent face-to-face discussing dose reduction, treatment plan and coordination of care.    Orders placed this encounter:  Orders Placed This Encounter  Procedures  . CBC with Differential/Platelet  . Comprehensive metabolic panel  . Magnesium  . CEA      Derek Jack, MD [facility] [phone]

## 2018-12-06 ENCOUNTER — Other Ambulatory Visit (HOSPITAL_COMMUNITY): Payer: Self-pay | Admitting: Hematology

## 2019-01-04 ENCOUNTER — Inpatient Hospital Stay (HOSPITAL_COMMUNITY): Payer: 59 | Attending: Hematology

## 2019-01-04 ENCOUNTER — Encounter (HOSPITAL_COMMUNITY): Payer: Self-pay

## 2019-01-04 ENCOUNTER — Other Ambulatory Visit: Payer: Self-pay

## 2019-01-04 VITALS — BP 136/90 | HR 79 | Temp 97.9°F | Resp 16

## 2019-01-04 DIAGNOSIS — Z452 Encounter for adjustment and management of vascular access device: Secondary | ICD-10-CM | POA: Insufficient documentation

## 2019-01-04 DIAGNOSIS — C187 Malignant neoplasm of sigmoid colon: Secondary | ICD-10-CM | POA: Diagnosis present

## 2019-01-04 DIAGNOSIS — Z95828 Presence of other vascular implants and grafts: Secondary | ICD-10-CM

## 2019-01-04 MED ORDER — HEPARIN SOD (PORK) LOCK FLUSH 100 UNIT/ML IV SOLN
500.0000 [IU] | Freq: Once | INTRAVENOUS | Status: AC
  Administered 2019-01-04: 500 [IU] via INTRAVENOUS

## 2019-01-04 MED ORDER — SODIUM CHLORIDE 0.9% FLUSH
10.0000 mL | Freq: Once | INTRAVENOUS | Status: AC
  Administered 2019-01-04: 11:00:00 10 mL via INTRAVENOUS

## 2019-01-04 NOTE — Progress Notes (Signed)
Patients port flushed without difficulty.  Good blood return noted with no bruising or swelling noted at site.  Band aid applied.  VSS with discharge and left ambulatory with no s/s of distress noted.  

## 2019-01-04 NOTE — Patient Instructions (Signed)
[facility] at [facility]  Discharge Instructions:   _______________________________________________________________  Thank you for choosing [facility] at [facility] to provide your oncology and hematology care.  To afford each patient quality time with our providers, please arrive at least 15 minutes before your scheduled appointment.  You need to re-schedule your appointment if you arrive 10 or more minutes late.  We strive to give you quality time with our providers, and arriving late affects you and other patients whose appointments are after yours.  Also, if you no show three or more times for appointments you may be dismissed from the clinic.  Again, thank you for choosing [facility] at [facility]. Our hope is that these requests will allow you access to exceptional care and in a timely manner. _______________________________________________________________  If you have questions after your visit, please contact our office at [phone] between the hours of 8:30 a.m. and 5:00 p.m. Voicemails left after 4:30 p.m. will not be returned until the following business day. _______________________________________________________________  For prescription refill requests, have your pharmacy contact our office. _______________________________________________________________  Recommendations made by the consultant and any test results will be sent to your referring physician. _______________________________________________________________ 

## 2019-01-05 ENCOUNTER — Encounter (HOSPITAL_COMMUNITY): Payer: BC Managed Care – PPO

## 2019-01-25 ENCOUNTER — Telehealth: Payer: Self-pay | Admitting: Gastroenterology

## 2019-01-25 NOTE — Telephone Encounter (Signed)
Repeat ct chest  

## 2019-01-26 ENCOUNTER — Telehealth: Payer: Self-pay

## 2019-01-26 ENCOUNTER — Telehealth: Payer: Self-pay | Admitting: Gastroenterology

## 2019-01-26 NOTE — Telephone Encounter (Signed)
Letter mailed

## 2019-01-26 NOTE — Telephone Encounter (Signed)
Pt on recall for CT Chest.  Pt had CT abd/pelvis in July 2020. Lung nodule seen on CT. Routing to [facility] to review.  Called pt, informed him [facility] will review CT abd/pelvis results and will notify him if any further is needed.

## 2019-01-26 NOTE — Telephone Encounter (Addendum)
PLEASE CALL PT. The CT SHOWED THE THE LEFT LOWER LUNG NODULE BUT NOT THE RIGHT  LOBE NODULE. HE NEEDS A CT OF THE CHEST

## 2019-01-26 NOTE — Telephone Encounter (Signed)
Pt received letter to call to schedule CT. [phone]

## 2019-01-26 NOTE — Telephone Encounter (Signed)
Pt states he thinks he is due for recall TCS. If he's due can you let him know if he needs OV.

## 2019-01-27 ENCOUNTER — Other Ambulatory Visit (HOSPITAL_COMMUNITY): Payer: BC Managed Care – PPO

## 2019-01-27 ENCOUNTER — Other Ambulatory Visit: Payer: Self-pay

## 2019-01-27 ENCOUNTER — Inpatient Hospital Stay (HOSPITAL_COMMUNITY): Payer: 59 | Attending: Hematology

## 2019-01-27 ENCOUNTER — Telehealth: Payer: Self-pay | Admitting: Gastroenterology

## 2019-01-27 ENCOUNTER — Encounter: Payer: Self-pay | Admitting: Gastroenterology

## 2019-01-27 DIAGNOSIS — Z85038 Personal history of other malignant neoplasm of large intestine: Secondary | ICD-10-CM | POA: Insufficient documentation

## 2019-01-27 DIAGNOSIS — G629 Polyneuropathy, unspecified: Secondary | ICD-10-CM | POA: Insufficient documentation

## 2019-01-27 DIAGNOSIS — C187 Malignant neoplasm of sigmoid colon: Secondary | ICD-10-CM

## 2019-01-27 LAB — CBC WITH DIFFERENTIAL/PLATELET
Abs Immature Granulocytes: 0.03 10*3/uL (ref 0.00–0.07)
Basophils Absolute: 0.1 10*3/uL (ref 0.0–0.1)
Basophils Relative: 1 %
Eosinophils Absolute: 0.5 10*3/uL (ref 0.0–0.5)
Eosinophils Relative: 6 %
HCT: 45.2 % (ref 39.0–52.0)
Hemoglobin: 15.3 g/dL (ref 13.0–17.0)
Immature Granulocytes: 0 %
Lymphocytes Relative: 29 %
Lymphs Abs: 2.2 10*3/uL (ref 0.7–4.0)
MCH: 30 pg (ref 26.0–34.0)
MCHC: 33.8 g/dL (ref 30.0–36.0)
MCV: 88.6 fL (ref 80.0–100.0)
Monocytes Absolute: 0.4 10*3/uL (ref 0.1–1.0)
Monocytes Relative: 6 %
Neutro Abs: 4.4 10*3/uL (ref 1.7–7.7)
Neutrophils Relative %: 58 %
Platelets: 172 10*3/uL (ref 150–400)
RBC: 5.1 MIL/uL (ref 4.22–5.81)
RDW: 12.5 % (ref 11.5–15.5)
WBC: 7.5 10*3/uL (ref 4.0–10.5)
nRBC: 0 % (ref 0.0–0.2)

## 2019-01-27 LAB — COMPREHENSIVE METABOLIC PANEL
ALT: 54 U/L — ABNORMAL HIGH (ref 0–44)
AST: 28 U/L (ref 15–41)
Albumin: 4.4 g/dL (ref 3.5–5.0)
Alkaline Phosphatase: 44 U/L (ref 38–126)
Anion gap: 8 (ref 5–15)
BUN: 18 mg/dL (ref 6–20)
CO2: 22 mmol/L (ref 22–32)
Calcium: 8.8 mg/dL — ABNORMAL LOW (ref 8.9–10.3)
Chloride: 107 mmol/L (ref 98–111)
Creatinine, Ser: 0.87 mg/dL (ref 0.61–1.24)
GFR calc Af Amer: >60 mL/min (ref >60)
GFR calc non Af Amer: >60 mL/min (ref >60)
Glucose, Bld: 97 mg/dL (ref 70–99)
Potassium: 3.7 mmol/L (ref 3.5–5.1)
Sodium: 137 mmol/L (ref 135–145)
Total Bilirubin: 0.6 mg/dL (ref 0.3–1.2)
Total Protein: 6.9 g/dL (ref 6.5–8.1)

## 2019-01-27 LAB — MAGNESIUM: Magnesium: 1.9 mg/dL (ref 1.7–2.4)

## 2019-01-27 NOTE — Telephone Encounter (Signed)
I forwarded to AS and JL to advise if he needed NV or OV.

## 2019-01-27 NOTE — Telephone Encounter (Signed)
OV made and letter mailed °

## 2019-01-27 NOTE — Telephone Encounter (Signed)
He was done with propofol last time and will need an OV.

## 2019-01-27 NOTE — Telephone Encounter (Signed)
Called and informed pt of [facility]'s recommendation. Pt doesn't want to schedule CT chest at this time. He wants to wait to discuss with Dr. [place] Pink at appt next week. States he has a lot of out of pocket expense right now and he can't afford anymore. He is aware of appt 02/15/19 to schedule TCS.

## 2019-01-27 NOTE — Telephone Encounter (Signed)
Pt is on the [facility] recall to repeat colonoscopy in one year. Will he need an OV or a Nurse Visit?

## 2019-01-28 LAB — CEA: CEA: 2.1 ng/mL (ref 0.0–4.7)

## 2019-01-28 NOTE — Telephone Encounter (Signed)
REVIEWED-NO ADDITIONAL RECOMMENDATIONS. 

## 2019-01-29 ENCOUNTER — Other Ambulatory Visit: Payer: Self-pay

## 2019-01-29 DIAGNOSIS — Z20822 Contact with and (suspected) exposure to covid-19: Secondary | ICD-10-CM

## 2019-01-30 LAB — NOVEL CORONAVIRUS, NAA: SARS-CoV-2, NAA: NOT DETECTED

## 2019-02-03 ENCOUNTER — Other Ambulatory Visit: Payer: Self-pay

## 2019-02-04 ENCOUNTER — Encounter (HOSPITAL_COMMUNITY): Payer: Self-pay | Admitting: Hematology

## 2019-02-04 ENCOUNTER — Inpatient Hospital Stay (HOSPITAL_BASED_OUTPATIENT_CLINIC_OR_DEPARTMENT_OTHER): Payer: 59 | Admitting: Hematology

## 2019-02-04 ENCOUNTER — Ambulatory Visit (HOSPITAL_COMMUNITY): Payer: BC Managed Care – PPO | Admitting: Hematology

## 2019-02-04 VITALS — BP 125/88 | HR 84 | Temp 97.9°F | Resp 20 | Wt 232.7 lb

## 2019-02-04 DIAGNOSIS — C187 Malignant neoplasm of sigmoid colon: Secondary | ICD-10-CM | POA: Diagnosis not present

## 2019-02-04 DIAGNOSIS — Z85038 Personal history of other malignant neoplasm of large intestine: Secondary | ICD-10-CM | POA: Diagnosis not present

## 2019-02-04 NOTE — Patient Instructions (Addendum)
[place] at [facility] Discharge Instructions  You were seen today by Dr. Delton Coombes. He went over your recent lab results. He will repeat CT scan in 3 months. He will see you back in 3 months for labs and follow up.   Thank you for choosing [place] at [facility] to provide your oncology and hematology care.  To afford each patient quality time with our provider, please arrive at least 15 minutes before your scheduled appointment time.   If you have a lab appointment with the [facility] please come in thru the  Main Entrance and check in at the main information desk  You need to re-schedule your appointment should you arrive 10 or more minutes late.  We strive to give you quality time with our providers, and arriving late affects you and other patients whose appointments are after yours.  Also, if you no show three or more times for appointments you may be dismissed from the clinic at the providers discretion.     Again, thank you for choosing [facility].  Our hope is that these requests will decrease the amount of time that you wait before being seen by our physicians.       _____________________________________________________________  Should you have questions after your visit to [facility], please contact our office at [phone] between the hours of 8:00 a.m. and 4:30 p.m.  Voicemails left after 4:00 p.m. will not be returned until the following business day.  For prescription refill requests, have your pharmacy contact our office and allow 72 hours.    Cancer Center Support Programs:   > Cancer Support Group  2nd Tuesday of the month 1pm-2pm, Journey Room

## 2019-02-04 NOTE — Progress Notes (Signed)
[address], [place] [address]   CLINIC:  Medical Oncology/Hematology  PCP:  Sharilyn Sites, [place] [address] [phone]   REASON FOR VISIT:  Follow-up for stage III colon cancer  CURRENT THERAPY:Surveillance post adjuvant chemotherapy.   BRIEF ONCOLOGIC HISTORY:  Oncology History  Malignant neoplasm of sigmoid colon [facility])   Initial Diagnosis   Cancer of sigmoid colon ([place])   03/27/2018 Genetic Testing   ALK c.350C>G VUS identified on the multicancer panel.  The Multi-Gene Panel offered by [facility] includes sequencing and/or deletion duplication testing of the following 85 genes: AIP, ALK, APC, ATM, AXIN2,BAP1,  BARD1, BLM, BMPR1A, BRCA1, BRCA2, BRIP1, CASR, CDC73, CDH1, CDK4, CDKN1B, CDKN1C, CDKN2A (p14ARF), CDKN2A (p16INK4a), CEBPA, CHEK2, CTNNA1, DICER1, DIS3L2, EGFR (c.2369C>T, p.Thr790Met variant only), EPCAM (Deletion/duplication testing only), FH, FLCN, GATA2, GPC3, GREM1 (Promoter region deletion/duplication testing only), HOXB13 (c.251G>A, p.Gly84Glu), HRAS, KIT, MAX, MEN1, MET, MITF (c.952G>A, p.Glu318Lys variant only), MLH1, MSH2, MSH3, MSH6, MUTYH, NBN, NF1, NF2, NTHL1, PALB2, PDGFRA, PHOX2B, PMS2, POLD1, POLE, POT1, PRKAR1A, PTCH1, PTEN, RAD50, RAD51C, RAD51D, RB1, RECQL4, RET, RNF43, RUNX1, SDHAF2, SDHA (sequence changes only), SDHB, SDHC, SDHD, SMAD4, SMARCA4, SMARCB1, SMARCE1, STK11, SUFU, TERC, TERT, TMEM127, TP53, TSC1, TSC2, VHL, WRN and WT1.   The report date is 03/27/2018.   04/21/2018 -  Chemotherapy   The patient had palonosetron (ALOXI) injection 0.25 mg, 0.25 mg, Intravenous,  Once, 13 of 13 cycles Administration: 0.25 mg (04/21/2018), 0.25 mg (05/05/2018), 0.25 mg (05/19/2018), 0.25 mg (06/02/2018), 0.25 mg (06/17/2018), 0.25 mg (06/30/2018), 0.25 mg (07/13/2018), 0.25 mg (08/11/2018), 0.25 mg (08/25/2018), 0.25 mg (09/08/2018), 0.25 mg (09/22/2018), 0.25 mg (10/06/2018) leucovorin 800 mg in dextrose 5 % 250 mL  infusion, 876 mg, Intravenous,  Once, 13 of 13 cycles Administration: 800 mg (04/21/2018), 900 mg (05/05/2018), 800 mg (05/19/2018), 800 mg (06/02/2018), 900 mg (06/17/2018), 900 mg (06/30/2018), 900 mg (07/13/2018), 900 mg (08/11/2018), 900 mg (08/25/2018), 876 mg (09/22/2018), 900 mg (09/08/2018), 876 mg (10/06/2018) oxaliplatin (ELOXATIN) 185 mg in dextrose 5 % 500 mL chemo infusion, 85 mg/m2 = 185 mg, Intravenous,  Once, 13 of 13 cycles Dose modification: 68 mg/m2 (80 % of original dose 85 mg/m2, Cycle 12, Reason: Other (see comments), Comment: neuropathy), 42.5 mg/m2 (50 % of original dose 85 mg/m2, Cycle 13, Reason: Provider Judgment) Administration: 185 mg (04/21/2018), 185 mg (05/05/2018), 185 mg (05/19/2018), 185 mg (06/02/2018), 185 mg (06/17/2018), 185 mg (06/30/2018), 185 mg (07/13/2018), 185 mg (08/11/2018), 185 mg (08/25/2018), 185 mg (09/08/2018), 150 mg (09/22/2018), 95 mg (10/06/2018) fluorouracil (ADRUCIL) chemo injection 900 mg, 400 mg/m2 = 900 mg, Intravenous,  Once, 13 of 13 cycles Administration: 900 mg (04/21/2018), 900 mg (05/05/2018), 900 mg (05/19/2018), 900 mg (06/02/2018), 900 mg (06/17/2018), 900 mg (06/30/2018), 900 mg (07/13/2018), 900 mg (08/11/2018), 900 mg (08/25/2018), 900 mg (09/08/2018), 900 mg (09/22/2018), 900 mg (10/06/2018) fosaprepitant (EMEND) 150 mg, dexamethasone (DECADRON) 12 mg in sodium chloride 0.9 % 145 mL IVPB, , Intravenous,  Once, 10 of 10 cycles Administration:  (06/02/2018),  (06/17/2018),  (06/30/2018),  (07/13/2018),  (08/11/2018),  (08/25/2018),  (09/08/2018),  (09/22/2018),  (10/06/2018) fluorouracil (ADRUCIL) 5,250 mg in sodium chloride 0.9 % 145 mL chemo infusion, 2,400 mg/m2 = 5,250 mg, Intravenous, 1 Day/Dose, 13 of 13 cycles Administration: 5,250 mg (04/21/2018), 5,250 mg (05/05/2018), 5,250 mg (05/19/2018), 5,250 mg (06/02/2018), 5,250 mg (06/17/2018), 5,250 mg (06/30/2018), 5,250 mg (07/13/2018), 5,250 mg (08/11/2018), 5,250 mg (08/25/2018), 5,250 mg (09/08/2018), 5,250 mg (09/22/2018), 5,250 mg (10/06/2018)  for chemotherapy treatment.       CANCER STAGING: Cancer Staging No matching staging information was found for the patient.   INTERVAL HISTORY:  Joel Jefferson 43 y.o. male seen for follow-up of colon cancer.  Denies any bleeding per rectum or melena.  No change in bowel habits reported.  Numbness in the feet and hands has been stable and slightly improved.  Appetite is 100%.  Energy levels are 75%.  He started working full-time as a EMT.  His neuropathy is not bothering him.  Denies any nausea, vomiting, diarrhea or constipation.  No new onset pains reported.    REVIEW OF SYSTEMS:  Review of Systems  Neurological: Positive for numbness.  All other systems reviewed and are negative.    PAST MEDICAL/SURGICAL HISTORY:  Past Medical History:  Diagnosis Date  . Family history of breast cancer   . GERD (gastroesophageal reflux disease)   . History of kidney stones   . Hypercholesterolemia   . Hypertension   . Urethral stricture    Past Surgical History:  Procedure Laterality Date  . BALLOON DILATION  04/06/2012   Procedure: BALLOON DILATION;  Surgeon: Joel A MacDiarmid, MD;  Location: [facility];  Service: Urology;  Laterality: N/A;  . BIOPSY  02/24/2018   Procedure: BIOPSY;  Surgeon: Jefferson, Joel L, MD;  Location: [facility];  Service: Endoscopy;;  colon  . COLONOSCOPY WITH PROPOFOL N/A 02/24/2018   Procedure: COLONOSCOPY WITH PROPOFOL;  Surgeon: Jefferson, Joel L, MD;  Location: [facility];  Service: Endoscopy;  Laterality: N/A;  9:30am  . ESOPHAGOGASTRODUODENOSCOPY (EGD) WITH PROPOFOL N/A 08/29/2015   Dr. Fields: possible proximal esophageal web s/p dilation, moderate gastritis, negative eosinophilic esophagitis   . PARTIAL COLECTOMY N/A 03/02/2018   Procedure: PARTIAL COLECTOMY;  Surgeon: Joel Jefferson, Mark, MD;  Location: [facility];  Service: General;  Laterality: N/A;  . PORTACATH PLACEMENT Left 04/13/2018   Procedure: INSERTION PORT-A-CATH;  Surgeon: Joel Jefferson,  Mark, MD;  Location: [facility];  Service: General;  Laterality: Left;  . SAVORY DILATION N/A 08/29/2015   Procedure: SAVORY DILATION;  Surgeon: Joel Jefferson Fields, MD;  Location: [facility];  Service: Endoscopy;  Laterality: N/A;  . URETHRAL DILATION    . WISDOM TOOTH EXTRACTION       SOCIAL HISTORY:  Social History   Socioeconomic History  . Marital status: Divorced    Spouse name: Not on file  . Number of children: Not on file  . Years of education: Not on file  . Highest education level: Not on file  Occupational History  . Not on file  Social Needs  . Financial resource strain: Not on file  . Food insecurity    Worry: Not on file    Inability: Not on file  . Transportation needs    Medical: Not on file    Non-medical: Not on file  Tobacco Use  . Smoking status: Former Smoker    Packs/day: 0.25    Years: 9.00    Pack years: 2.25    Types: Cigarettes    Quit date: 07/27/2011    Years since quitting: 7.5  . Smokeless tobacco: Never Used  Substance and Sexual Activity  . Alcohol use: Yes    Alcohol/week: 6.0 standard drinks    Types: 6 Cans of beer per week  . Drug use: No  . Sexual activity: Yes    Birth control/protection: None  Lifestyle  . Physical activity    Days per week: Not on file      Minutes per session: Not on file  . Stress: Not on file  Relationships  . Social connections    Talks on phone: Not on file    Gets together: Not on file    Attends religious service: Not on file    Active member of club or organization: Not on file    Attends meetings of clubs or organizations: Not on file    Relationship status: Not on file  . Intimate partner violence    Fear of current or ex partner: Not on file    Emotionally abused: Not on file    Physically abused: Not on file    Forced sexual activity: Not on file  Other Topics Concern  . Not on file  Social History Narrative  . Not on file    FAMILY HISTORY:  Family History  Problem Relation Age of Onset   . Dementia Mother   . Dementia Maternal Aunt   . Dementia Maternal Grandmother   . COPD Maternal Grandfather   . Breast cancer Maternal Aunt 42  . Breast cancer Maternal Aunt        dx in her 50s  . Lung cancer Maternal Aunt   . Healthy Son   . Colon cancer Neg Hx     CURRENT MEDICATIONS:  Outpatient Encounter Medications as of 02/04/2019  Medication Sig  . Ascorbic Acid (VITAMIN C) 1000 MG tablet Take 1,000 mg by mouth daily.  . CONTOUR NEXT TEST test strip USE TO CHECK BLOOD SUGAR BID  . esomeprazole (NEXIUM) 40 MG capsule Take 40 mg by mouth daily.   . glipiZIDE (GLUCOTROL XL) 5 MG 24 hr tablet TAKE 1 TABLET(5 MG) BY MOUTH DAILY WITH BREAKFAST  . lisinopril (PRINIVIL,ZESTRIL) 10 MG tablet Take 10 mg by mouth daily.   . Magnesium 500 MG CAPS Take 500 mg by mouth daily.   . Multiple Vitamin (MULTIVITAMIN) tablet Take 1 tablet by mouth daily.  . Potassium (POTASSIMIN PO) Take 1 tablet by mouth daily.   . pravastatin (PRAVACHOL) 20 MG tablet Take 20 mg by mouth daily.   . topiramate (TOPAMAX) 100 MG tablet Take 100 mg by mouth 2 (two) times daily.   . fluorouracil CALGB 80702 in sodium chloride 0.9 % 150 mL Inject 6,150 mg into the vein every 14 (fourteen) days. Over 46 hours  . LEUCOVORIN CALCIUM IV Inject 876 mg into the vein every 14 (fourteen) days.  . LORazepam (ATIVAN) 0.5 MG tablet Take 1 tablet (0.5 mg total) by mouth every 8 (eight) hours. (Patient not taking: Reported on 02/04/2019)  . ondansetron (ZOFRAN) 8 MG tablet Take 1 tablet (8 mg total) by mouth every 8 (eight) hours as needed for nausea or vomiting (may start taking as needed two days after chemotherapy). (Patient not taking: Reported on 02/04/2019)  . OXALIPLATIN IV Inject 185 mg into the vein every 14 (fourteen) days.  . promethazine (PHENERGAN) 25 MG tablet Take 1 tablet (25 mg total) by mouth every 6 (six) hours as needed for nausea or vomiting. (Patient not taking: Reported on 02/04/2019)  . [DISCONTINUED]  lidocaine-prilocaine (EMLA) cream Apply small amount to port site one hour prior to appointment. Cover with plastic wrap   Facility-Administered Encounter Medications as of 02/04/2019  Medication  . sodium chloride flush (NS) 0.9 % injection 10 mL    ALLERGIES:  No Known Allergies   PHYSICAL EXAM:  ECOG Performance status: 1  Vitals:   02/04/19 1520  BP: 125/88  Pulse: 84  Resp: 20    Temp: 97.9 F (36.6 C)  SpO2: 97%   Filed Weights   02/04/19 1520  Weight: 232 lb 11.2 oz (105.6 kg)    Physical Exam Vitals signs reviewed.  Constitutional:      Appearance: Normal appearance.  Cardiovascular:     Rate and Rhythm: Normal rate and regular rhythm.     Heart sounds: Normal heart sounds.  Pulmonary:     Effort: Pulmonary effort is normal.     Breath sounds: Normal breath sounds.  Abdominal:     General: There is no distension.     Palpations: Abdomen is soft. There is no mass.  Musculoskeletal:        General: No swelling.  Skin:    General: Skin is warm.  Neurological:     General: No focal deficit present.     Mental Status: He is alert and oriented to person, place, and time.  Psychiatric:        Mood and Affect: Mood normal.        Behavior: Behavior normal.      LABORATORY DATA:  I have reviewed the labs as listed.  CBC    Component Value Date/Time   WBC 7.5 01/27/2019 1558   RBC 5.10 01/27/2019 1558   HGB 15.3 01/27/2019 1558   HCT 45.2 01/27/2019 1558   PLT 172 01/27/2019 1558   MCV 88.6 01/27/2019 1558   MCH 30.0 01/27/2019 1558   MCHC 33.8 01/27/2019 1558   RDW 12.5 01/27/2019 1558   LYMPHSABS 2.2 01/27/2019 1558   MONOABS 0.4 01/27/2019 1558   EOSABS 0.5 01/27/2019 1558   BASOSABS 0.1 01/27/2019 1558   CMP Latest Ref Rng & Units 01/27/2019 10/23/2018 10/06/2018  Glucose 70 - 99 mg/dL 97 144(H) 151(H)  BUN 6 - 20 mg/dL 18 18 14  Creatinine 0.61 - 1.24 mg/dL 0.87 0.78 1.03  Sodium 135 - 145 mmol/Jefferson 137 138 141  Potassium 3.5 - 5.1 mmol/Jefferson 3.7  4.3 4.1  Chloride 98 - 111 mmol/Jefferson 107 108 111  CO2 22 - 32 mmol/Jefferson 22 23 23  Calcium 8.9 - 10.3 mg/dL 8.8(Jefferson) 9.0 9.0  Total Protein 6.5 - 8.1 g/dL 6.9 6.6 6.8  Total Bilirubin 0.3 - 1.2 mg/dL 0.6 0.8 0.7  Alkaline Phos 38 - 126 U/Jefferson 44 52 55  AST 15 - 41 U/Jefferson 28 35 36  ALT 0 - 44 U/Jefferson 54(H) 56(H) 57(H)       DIAGNOSTIC IMAGING:  I have independently reviewed the scans and discussed with the patient.      ASSESSMENT & PLAN:   Malignant neoplasm of sigmoid colon ([facility]) 1.  Stage III (PT3PN2B) sigmoid colon adenocarcinoma, MSI-stable: - Status post sigmoid colon segmental resection on 03/02/2018 by Dr. Jenkins, 14/35 lymph nodes positive. - PET CT scan on 03/30/2018 showed bilateral hypermetabolic pelvic sidewall lymph nodes.  There are subcentimeter not in the initial distribution pattern for sigmoid primary.  Lymph nodes are in the bilateral external iliac and right inguinal lymph node. -12 cycles of FOLFOX from 04/21/2018 through 10/06/2018. -CTAP on 10/23/2018 no evidence of recurrence or metastatic disease.  Hepatic steatosis. -He denies any change in bowel habits.  No bleeding per rectum or melena. -We reviewed his labs.  CEA is within normal limits.  Mildly elevated ALT is improving. -CT chest prior to diagnosis showed 2 lung nodules in the right lung which are benign. -I plan to see him back in 3 months for follow-up.  We will plan to repeat CT abdomen   and pelvis with CEA level. -He is gone back to work full-time.   2.  Peripheral neuropathy: -Constant numbness in the fingertips and toes has improved from last visit. - If they become painful, will consider starting him on gabapentin/duloxetine.      Orders placed this encounter:  Orders Placed This Encounter  Procedures  . CT Abdomen Pelvis W Contrast  . CBC with Differential/Platelet  . Comprehensive metabolic panel  . CEA      Derek Jack, MD [facility] [phone]

## 2019-02-04 NOTE — Assessment & Plan Note (Signed)
1.  Stage III (PT3PN2B) sigmoid colon adenocarcinoma, MSI-stable: - Status post sigmoid colon segmental resection on 03/02/2018 by Dr. Arnoldo Morale, 14/35 lymph nodes positive. - PET CT scan on 03/30/2018 showed bilateral hypermetabolic pelvic sidewall lymph nodes.  There are subcentimeter not in the initial distribution pattern for sigmoid primary.  Lymph nodes are in the bilateral external iliac and right inguinal lymph node. -12 cycles of FOLFOX from 04/21/2018 through 10/06/2018. -CTAP on 10/23/2018 no evidence of recurrence or metastatic disease.  Hepatic steatosis. -He denies any change in bowel habits.  No bleeding per rectum or melena. -We reviewed his labs.  CEA is within normal limits.  Mildly elevated ALT is improving. -CT chest prior to diagnosis showed 2 lung nodules in the right lung which are benign. -I plan to see him back in 3 months for follow-up.  We will plan to repeat CT abdomen and pelvis with CEA level. -He is gone back to work full-time.   2.  Peripheral neuropathy: -Constant numbness in the fingertips and toes has improved from last visit. - If they become painful, will consider starting him on gabapentin/duloxetine.

## 2019-02-15 ENCOUNTER — Other Ambulatory Visit: Payer: Self-pay

## 2019-02-15 ENCOUNTER — Encounter: Payer: Self-pay | Admitting: Gastroenterology

## 2019-02-15 ENCOUNTER — Telehealth: Payer: Self-pay | Admitting: Gastroenterology

## 2019-02-15 ENCOUNTER — Ambulatory Visit (INDEPENDENT_AMBULATORY_CARE_PROVIDER_SITE_OTHER): Payer: 59 | Admitting: Gastroenterology

## 2019-02-15 DIAGNOSIS — Z85038 Personal history of other malignant neoplasm of large intestine: Secondary | ICD-10-CM | POA: Diagnosis not present

## 2019-02-15 DIAGNOSIS — K760 Fatty (change of) liver, not elsewhere classified: Secondary | ICD-10-CM

## 2019-02-15 MED ORDER — SUPREP BOWEL PREP KIT 17.5-3.13-1.6 GM/177ML PO SOLN: 1.0000 | ORAL | 0 refills | Status: DC

## 2019-02-15 NOTE — Progress Notes (Addendum)
REVIEWED-NO ADDITIONAL RECOMMENDATIONS.  Primary Care Physician:  Sharilyn Sites, MD Primary GI:  Facility age limit for growth percentiles is 20 years.    Patient Location: Home  Provider Location: [place] office  Reason for Visit: surveillance colonoscopy  Persons present on the virtual encounter, with roles: Patient, myself (provider),Mindy Quincy Simmonds, CMA (updated meds and allergies)  Total time (minutes) spent on medical discussion: 20 minutes  Due to COVID-19, visit was conducted using Doxy.me method.  Visit was requested by patient.  Virtual Visit via Doxy.me  I connected with [name] on 02/15/19 at 11:30 AM EST by [email or web address] and verified that I am speaking with the correct person using two identifiers.   I discussed the limitations, risks, security and privacy concerns of performing an evaluation and management service by telephone/video and the availability of in person appointments. I also discussed with the patient that there may be a patient responsible charge related to this service. The patient expressed understanding and agreed to proceed.  Chief Complaint  Patient presents with   Consult    repeat TCS. Doing okay     HPI:   Joel Jefferson. is a 43 y.o. male who presents for virtual visit regarding to schedule one year surveillance colonoscopy.  Diagnosed with stage III sigmoid colon adenocarcinoma in November 2019.  Status post sigmoid colon resection November 2019 by Dr. Arnoldo Morale, 14 of 35 lymph nodes positive. PET CT scan on 03/30/2018 showed bilateral hypermetabolic pelvic sidewall lymph nodes.  There are subcentimeter not in the initial distribution pattern for sigmoid primary.  Lymph nodes are in the bilateral external iliac and right inguinal lymph node.  He has completed chemotherapy, last cycle October 06, 2018.  CT July 2020 with no evidence of recurrence or metastatic disease.  CT chest prior to diagnosis showed 2 lung nodules in the right lung which  were benign.  Plans for repeat CT in 3 months along with CEA.  Abnormal AST/ALT since December 2019.  AST is improved.  ALT remains elevated in the 50-60 range.  Fatty liver noted on imaging.  Weight is up 15 pounds since June.  Feels well.  No bowel concerns.  No abdominal pain.  Occasional bright red blood on the toilet tissue.  He feels this is related to hemorrhoids.  Heartburn well controlled on Nexium.  No dysphagia.  Appetite is good.  He is due for 1 year surveillance colonoscopy.  HE IS UNDER IMPRESSION THAT HE NEEDS COLONOSCOPY YEARLY DUE TO HIS COLON CANCER.   Reviewed Genetic counseling note dated 04/02/2018. Patient had negative genetic test and not clear why he had CRC at young age. "Genetic testing did detect a Variant of Unknown Significance in the ALK gene called c.350C>G. At this time, it is unknown if this variant is associated with increased cancer risk or if this is a normal finding, but most variants such as this get reclassified to being inconsequential." Recommendations for frequent colonoscopies as he may be at an increased risk for a second colonoscopy. Also advised to have upper endoscopy periodically.   PET 03/2018: esophageal wall thickening suggestive of esophagitis.     Current Outpatient Medications  Medication Sig Dispense Refill   Ascorbic Acid (VITAMIN C) 1000 MG tablet Take 1,000 mg by mouth. Twice a week     CONTOUR NEXT TEST test strip USE TO CHECK BLOOD SUGAR BID     esomeprazole (NEXIUM) 40 MG capsule Take 40 mg by mouth daily.  glipiZIDE (GLUCOTROL XL) 5 MG 24 hr tablet TAKE 1 TABLET(5 MG) BY MOUTH DAILY WITH BREAKFAST 30 tablet 3   lisinopril (PRINIVIL,ZESTRIL) 10 MG tablet Take 10 mg by mouth daily.   5   Magnesium 500 MG CAPS Take 500 mg by mouth daily.      Multiple Vitamin (MULTIVITAMIN) tablet Take 1 tablet by mouth daily.     Potassium (POTASSIMIN PO) Take 1 tablet by mouth daily.      pravastatin (PRAVACHOL) 20 MG tablet Take 20 mg  by mouth daily.   1   topiramate (TOPAMAX) 100 MG tablet Take 100 mg by mouth daily.      No current facility-administered medications for this visit.    Facility-Administered Medications Ordered in Other Visits  Medication Dose Route Frequency Provider Last Rate Last Dose   sodium chloride flush (NS) 0.9 % injection 10 mL  10 mL Intracatheter PRN Derek Jack, MD   10 mL at 09/10/18 1347    Past Medical History:  Diagnosis Date   Family history of breast cancer    GERD (gastroesophageal reflux disease)    History of kidney stones    Hypercholesterolemia    Hypertension    Urethral stricture     Past Surgical History:  Procedure Laterality Date   BALLOON DILATION  04/06/2012   Procedure: BALLOON DILATION;  Surgeon: Reece Packer, MD;  Location: [facility];  Service: Urology;  Laterality: N/A;   BIOPSY  02/24/2018   Procedure: BIOPSY;  Surgeon: Danie Binder, MD;  Location: [facility];  Service: Endoscopy;;  colon   COLONOSCOPY WITH PROPOFOL N/A 02/24/2018   Dr. Oneida Alar: fungating, infiltrative partially obstructing mass in sigmoid colon, area tattooed. ext/int hemorrhoids. Bx: adenocarcinoma   ESOPHAGOGASTRODUODENOSCOPY (EGD) WITH PROPOFOL N/A 08/29/2015   Dr. Oneida Alar: possible proximal esophageal web s/p dilation, moderate gastritis, negative eosinophilic esophagitis    PARTIAL COLECTOMY N/A 03/02/2018   Procedure: PARTIAL COLECTOMY;  Surgeon: Aviva Signs, MD;  Location: [facility];  Service: General;  Laterality: N/A;   PORTACATH PLACEMENT Left 04/13/2018   Procedure: INSERTION PORT-A-CATH;  Surgeon: Aviva Signs, MD;  Location: [facility];  Service: General;  Laterality: Left;   SAVORY DILATION N/A 08/29/2015   Procedure: SAVORY DILATION;  Surgeon: Danie Binder, MD;  Location: [facility];  Service: Endoscopy;  Laterality: N/A;   URETHRAL DILATION     WISDOM TOOTH EXTRACTION      Family History  Problem Relation Age of Onset    Dementia Mother    Dementia Maternal Aunt    Dementia Maternal Grandmother    COPD Maternal Grandfather    Breast cancer Maternal Aunt 42   Breast cancer Maternal Aunt        dx in her 19s   Lung cancer Maternal Aunt    Healthy Son    Colon cancer Neg Hx     Social History   Socioeconomic History   Marital status: Divorced    Spouse name: Not on file   Number of children: Not on file   Years of education: Not on file   Highest education level: Not on file  Occupational History   Not on file  Social Needs   Financial resource strain: Not on file   Food insecurity    Worry: Not on file    Inability: Not on file   Transportation needs    Medical: Not on file    Non-medical: Not on file  Tobacco Use  Smoking status: Former Smoker    Packs/day: 0.25    Years: 9.00    Pack years: 2.25    Types: Cigarettes    Quit date: 07/27/2011    Years since quitting: 7.5   Smokeless tobacco: Never Used  Substance and Sexual Activity   Alcohol use: Yes    Alcohol/week: 6.0 standard drinks    Types: 6 Cans of beer per week   Drug use: No   Sexual activity: Yes    Birth control/protection: None  Lifestyle   Physical activity    Days per week: Not on file    Minutes per session: Not on file   Stress: Not on file  Relationships   Social connections    Talks on phone: Not on file    Gets together: Not on file    Attends religious service: Not on file    Active member of club or organization: Not on file    Attends meetings of clubs or organizations: Not on file    Relationship status: Not on file   Intimate partner violence    Fear of current or ex partner: Not on file    Emotionally abused: Not on file    Physically abused: Not on file    Forced sexual activity: Not on file  Other Topics Concern   Not on file  Social History Narrative   Not on file      ROS:  General: Negative for anorexia, weight loss, fever, chills, fatigue,  weakness. Eyes: Negative for vision changes.  ENT: Negative for hoarseness, difficulty swallowing , nasal congestion. CV: Negative for chest pain, angina, palpitations, dyspnea on exertion, peripheral edema.  Respiratory: Negative for dyspnea at rest, dyspnea on exertion, cough, sputum, wheezing.  GI: See history of present illness. GU:  Negative for dysuria, hematuria, urinary incontinence, urinary frequency, nocturnal urination.  MS: Negative for joint pain, low back pain.  Derm: Negative for rash or itching.  Neuro: Negative for weakness, abnormal sensation, seizure, frequent headaches, memory loss, confusion.  Psych: Negative for anxiety, depression, suicidal ideation, hallucinations.  Endo: Negative for unusual weight change.  Heme: Negative for bruising or bleeding. Allergy: Negative for rash or hives.   Observations/Objective: Pleasant male, NAD. Good color. No SOB.   Assessment and Plan: Pleasant 43 year old gentleman with history of stage III adenocarcinoma of the sigmoid colon status post resection November 2019, completing chemotherapy in June 2020 presenting to schedule surveillance colonoscopy.  Doing well.  No GI complaints.  He is due for a 1 year surveillance colonoscopy.  Reviewed genetic counselor note from December 2019.  He was noted to have a variant of unknown significance in the ALK gene called c.350C>G.  This variant is unknown significance whether there is any association with increased cancer risk for normal finding there is classified as inconsequential.  Overall test was negative.  Recommendations for his medical management and screening include frequent colonoscopy screening as he would be at increased risk for second colorectal cancer in the future, consider upper endoscopy periodically.  Previous PET December 2019 suggestive of esophagitis.  Last EGD 2017.  Patient also under the impression that he is supposed to be receiving yearly colonoscopies.    Plan for  colonoscopy in the near future.  Plan for deep sedation as before.  I have discussed the risks, alternatives, benefits with regards to but not limited to the risk of reaction to medication, bleeding, infection, perforation and the patient is agreeable to proceed. Written consent to be obtained.  Will discuss need for future EGDs with Dr. Oneida Alar.   Patient also has a history of multiple abnormal LFTs, fatty liver on previous imaging. Recommend weight loss, exercise, limit etoh. Handout provided. Return to the office in six months. Consider further work up (ie viral markers, serologies etc) if persistent elevation of numbers at that time.   Follow Up Instructions:    I discussed the assessment and treatment plan with the patient. The patient was provided an opportunity to ask questions and all were answered. The patient agreed with the plan and demonstrated an understanding of the instructions. AVS mailed to patient's home address.   The patient was advised to call back or seek an in-person evaluation if the symptoms worsen or if the condition fails to improve as anticipated.  I provided 20 minutes of virtual face-to-face time during this encounter.   Joel Crouch, PA-C

## 2019-02-15 NOTE — Telephone Encounter (Signed)
Pt had a DOXY this morning and was calling to see when he could schedule his procedure. [phone]

## 2019-02-15 NOTE — Patient Instructions (Signed)
1. Colonoscopy as scheduled. See separate instructions.  2. Ask Dr. Delton Coombes regarding your concerns for need of yearly colonoscopy. Let me know what you find out. I will also discuss with Dr. Oneida Alar since this is a discrepancy from GI standpoint.  3. You also have fatty liver and mildly elevated ALT (liver lab) which has been present chronically. Try to be as active as possible. Exercise 30 minutes daily as tolerated. Try to drop 10-15 pounds and limit alcohol consumption. See below.  4. Return to the office in six months.    Fatty Liver Disease  Fatty liver disease occurs when too much fat has built up in your liver cells. Fatty liver disease is also called hepatic steatosis or steatohepatitis. The liver removes harmful substances from your bloodstream and produces fluids that your body needs. It also helps your body use and store energy from the food you eat. In many cases, fatty liver disease does not cause symptoms or problems. It is often diagnosed when tests are being done for other reasons. However, over time, fatty liver can cause inflammation that may lead to more serious liver problems, such as scarring of the liver (cirrhosis) and liver failure. Fatty liver is associated with insulin resistance, increased body fat, high blood pressure (hypertension), and high cholesterol. These are features of metabolic syndrome and increase your risk for stroke, diabetes, and heart disease. What are the causes? This condition may be caused by:  Drinking too much alcohol.  Poor nutrition.  Obesity.  Cushing's syndrome.  Diabetes.  High cholesterol.  Certain drugs.  Poisons.  Some viral infections.  Pregnancy. What increases the risk? You are more likely to develop this condition if you:  Abuse alcohol.  Are overweight.  Have diabetes.  Have hepatitis.  Have a high triglyceride level.  Are pregnant. What are the signs or symptoms? Fatty liver disease often does not cause  symptoms. If symptoms do develop, they can include:  Fatigue.  Weakness.  Weight loss.  Confusion.  Abdominal pain.  Nausea and vomiting.  Yellowing of your skin and the white parts of your eyes (jaundice).  Itchy skin. How is this diagnosed? This condition may be diagnosed by:  A physical exam and medical history.  Blood tests.  Imaging tests, such as an ultrasound, CT scan, or MRI.  A liver biopsy. A small sample of liver tissue is removed using a needle. The sample is then looked at under a microscope. How is this treated? Fatty liver disease is often caused by other health conditions. Treatment for fatty liver may involve medicines and lifestyle changes to manage conditions such as:  Alcoholism.  High cholesterol.  Diabetes.  Being overweight or obese. Follow these instructions at home:   Do not drink alcohol. If you have trouble quitting, ask your health care provider how to safely quit with the help of medicine or a supervised program. This is important to keep your condition from getting worse.  Eat a healthy diet as told by your health care provider. Ask your health care provider about working with a diet and nutrition specialist (dietitian) to develop an eating plan.  Exercise regularly. This can help you lose weight and control your cholesterol and diabetes. Talk to your health care provider about an exercise plan and which activities are best for you.  Take over-the-counter and prescription medicines only as told by your health care provider.  Keep all follow-up visits as told by your health care provider. This is important. Contact a health  care provider if: You have trouble controlling your:  Blood sugar. This is especially important if you have diabetes.  Cholesterol.  Drinking of alcohol. Get help right away if:  You have abdominal pain.  You have jaundice.  You have nausea and vomiting.  You vomit blood or material that looks like  coffee grounds.  You have stools that are black, tar-like, or bloody. Summary  Fatty liver disease develops when too much fat builds up in the cells of your liver.  Fatty liver disease often causes no symptoms or problems. However, over time, fatty liver can cause inflammation that may lead to more serious liver problems, such as scarring of the liver (cirrhosis).  You are more likely to develop this condition if you abuse alcohol, are pregnant, are overweight, have diabetes, have hepatitis, or have high triglyceride levels.  Contact your health care provider if you have trouble controlling your weight, blood sugar, cholesterol, or drinking of alcohol. This information is not intended to replace advice given to you by your health care provider. Make sure you discuss any questions you have with your health care provider. Document Released: 05/10/2005 Document Revised: 03/07/2017 Document Reviewed: 01/01/2017 Elsevier Patient Education  2020 [employer].   Nonalcoholic Fatty Liver Disease Diet, Adult Nonalcoholic fatty liver disease is a condition that causes fat to build up in and around the liver. The disease makes it harder for the liver to work the way that it should. Following a healthy diet can help to keep nonalcoholic fatty liver disease under control. It can also help to prevent or improve conditions that are associated with the disease, such as heart disease, diabetes, high blood pressure, and abnormal cholesterol levels. Along with regular exercise, this diet:  Promotes weight loss.  Helps to control blood sugar levels.  Helps to improve the way that the body uses insulin. What are tips for following this plan? Reading food labels Always check food labels for:  The amount of saturated fat in a food. You should limit your intake of saturated fat. Saturated fat is found in foods that come from animals, including meat and dairy products such as butter, cheese, and whole milk.   The amount of fiber in a food. You should choose high-fiber foods such as fruits, vegetables, and whole grains. Try to get 25-30 grams (g) of fiber a day.  Cooking  When cooking, use heart-healthy oils that are high in monounsaturated fats. These include olive oil, canola oil, and avocado oil.  Limit frying or deep-frying foods. Cook foods using healthy methods such as baking, boiling, steaming, and grilling instead. Meal planning  You may want to keep track of how many calories you take in. Eating the right amount of calories will help you achieve a healthy weight. Meeting with a registered dietitian can help you get started.  Limit how often you eat takeout and fast food. These foods are usually very high in fat, salt, and sugar.  Use the glycemic index (GI) to plan your meals. The index tells you how quickly a food will raise your blood sugar. Choose low-GI foods (GI less than 55). These foods take a longer time to raise blood sugar. A registered dietitian can help you identify foods lower on the GI scale. Lifestyle  You may want to follow a Mediterranean diet. This diet includes a lot of vegetables, lean meats or fish, whole grains, fruits, and healthy oils and fats. What foods can I eat?  Fruits Bananas. Apples. Oranges. Grapes. Papaya.  Mango. Pomegranate. Kiwi. Grapefruit. Cherries. Vegetables Lettuce. Spinach. Peas. Beets. Cauliflower. Cabbage. Broccoli. Carrots. Tomatoes. Squash. Eggplant. Herbs. Peppers. Onions. Cucumbers. Brussels sprouts. Yams and sweet potatoes. Beans. Lentils. Grains Whole wheat or whole-grain foods, including breads, crackers, cereals, and pasta. Stone-ground whole wheat. Unsweetened oatmeal. Bulgur. Barley. Quinoa. Brown or wild rice. Corn or whole wheat flour tortillas. Meats and other proteins Lean meats. Poultry. Tofu. Seafood and shellfish. Dairy Low-fat or fat-free dairy products, such as yogurt, cottage cheese, or cheese. Beverages Water. Sugar-free  drinks. Tea. Coffee. Low-fat or skim milk. Milk alternatives, such as soy or almond milk. Real fruit juice. Fats and oils Avocado. Canola or olive oil. Nuts and nut butters. Seeds. Seasonings and condiments Mustard. Relish. Low-fat, low-sugar ketchup and barbecue sauce. Low-fat or fat-free mayonnaise. Sweets and desserts Sugar-free sweets. The items listed above may not be a complete list of foods and beverages you can eat. Contact a dietitian for more information. What foods should I limit or avoid? Meats and other proteins Limit red meat to 1-2 times a week. Dairy [employer]. Fats and oils Palm oil and coconut oil. Fried foods. Other foods Processed foods. Foods that contain a lot of salt or sodium. Sweets and desserts Sweets that contain sugar. Beverages Sweetened drinks, such as sweet tea, milkshakes, iced sweet drinks, and sodas. Alcohol. The items listed above may not be a complete list of foods and beverages you should avoid. Contact a dietitian for more information. Where to find more information The [employer] of Diabetes and Digestive and Kidney Diseases: [employer] Summary  Nonalcoholic fatty liver disease is a condition that causes fat to build up in and around the liver.  Following a healthy diet can help to keep nonalcoholic fatty liver disease under control. Your diet should be rich in fruits, vegetables, whole grains, and lean proteins.  Limit your intake of saturated fat. Saturated fat is found in foods that come from animals, including meat and dairy products such as butter, cheese, and whole milk.  This diet promotes weight loss, helps to control blood sugar levels, and helps to improve the way that the body uses insulin. This information is not intended to replace advice given to you by your health care provider. Make sure you discuss any questions you have with your health care provider. Document Released: 08/09/2014 Document Revised: 07/17/2018  Document Reviewed: 04/16/2018 Elsevier Patient Education  2020 [employer].

## 2019-02-15 NOTE — Telephone Encounter (Signed)
Called pt, TCS w/Propofol w/SLF scheduled for 06/01/19 at 9:30am. Pt aware he has been added to cancellation list. Orders entered.

## 2019-02-16 ENCOUNTER — Other Ambulatory Visit: Payer: Self-pay

## 2019-02-16 ENCOUNTER — Telehealth: Payer: Self-pay

## 2019-02-16 NOTE — Telephone Encounter (Signed)
Pre-op appt and COVID test 05/31/19. Appt letter mailed with procedure instructions.

## 2019-02-16 NOTE — Telephone Encounter (Signed)
PA for TCS submitted via [facility] website. Case approved. PA# [id number], valid 06/01/19-08/30/19.

## 2019-02-16 NOTE — Telephone Encounter (Signed)
Called pt, offered to move up TCS to 02/23/19. He will call office back to let me know.

## 2019-02-16 NOTE — Telephone Encounter (Signed)
noted 

## 2019-02-16 NOTE — Telephone Encounter (Signed)
Pt called office and LMOVM, he is unable to have TCS 02/23/19. He requested to keep TCS 06/01/19 as scheduled.  FYI to LSL.

## 2019-04-10 ENCOUNTER — Other Ambulatory Visit (HOSPITAL_COMMUNITY): Payer: Self-pay | Admitting: Hematology

## 2019-04-19 ENCOUNTER — Ambulatory Visit: Payer: PRIVATE HEALTH INSURANCE | Attending: Internal Medicine

## 2019-04-19 ENCOUNTER — Other Ambulatory Visit: Payer: Self-pay

## 2019-04-19 DIAGNOSIS — Z20822 Contact with and (suspected) exposure to covid-19: Secondary | ICD-10-CM

## 2019-04-20 LAB — NOVEL CORONAVIRUS, NAA: SARS-CoV-2, NAA: DETECTED — AB

## 2019-04-21 ENCOUNTER — Telehealth: Payer: Self-pay | Admitting: Gastroenterology

## 2019-04-21 ENCOUNTER — Ambulatory Visit: Payer: Self-pay

## 2019-04-21 NOTE — Telephone Encounter (Signed)
Called patient. He tested positive for COVID 1/11. Procedure scheduled for February. Made aware he will still be able to have procedure done but he will not be retested for covid since this is within 90 days. He is aware he will still need to go for his pre-op appt. He voiced understanding. Called endo and LMOVM making aware.

## 2019-04-21 NOTE — Telephone Encounter (Signed)
Outgoing call to pt. Patient want to know how long he should be on isolation .  Reviewed isolation protocol  And ending protocol process with Patient.  Patient voiced understanding.

## 2019-04-21 NOTE — Telephone Encounter (Signed)
Pt said he was tested on Monday and is positive for Covid. He needs to cancel his procedure with SF for 2/23. He said he has been trying to get his colonoscopy scheduled since November and has concerns since he has a hx of colon cancer. I told him that I would let the scheduler be aware. [phone]

## 2019-04-21 NOTE — Telephone Encounter (Signed)
Attempted to contact patient. Left VM to return call to [phone]. Patient has positive COVID-19 test 1-11/21.Requesting advice on how long to isolate.

## 2019-04-21 NOTE — Telephone Encounter (Signed)
Pt called back to say his paperwork said if he tested positive he would have to wait 90 days before being retested and he is scheduled covid test on 05/31/2019. Please advise

## 2019-05-10 ENCOUNTER — Other Ambulatory Visit: Payer: Self-pay

## 2019-05-10 ENCOUNTER — Inpatient Hospital Stay (HOSPITAL_COMMUNITY): Payer: 59

## 2019-05-10 ENCOUNTER — Ambulatory Visit (HOSPITAL_COMMUNITY)
Admission: RE | Admit: 2019-05-10 | Discharge: 2019-05-10 | Disposition: A | Discharge status: Home / Self Care | Payer: 59 | Source: Ambulatory Visit | Attending: Hematology | Admitting: Hematology

## 2019-05-10 DIAGNOSIS — R7989 Other specified abnormal findings of blood chemistry: Secondary | ICD-10-CM | POA: Diagnosis not present

## 2019-05-10 DIAGNOSIS — C187 Malignant neoplasm of sigmoid colon: Secondary | ICD-10-CM | POA: Diagnosis not present

## 2019-05-10 DIAGNOSIS — Z79899 Other long term (current) drug therapy: Secondary | ICD-10-CM | POA: Diagnosis not present

## 2019-05-10 DIAGNOSIS — Z452 Encounter for adjustment and management of vascular access device: Secondary | ICD-10-CM | POA: Diagnosis not present

## 2019-05-10 DIAGNOSIS — Z85038 Personal history of other malignant neoplasm of large intestine: Secondary | ICD-10-CM | POA: Insufficient documentation

## 2019-05-10 DIAGNOSIS — R945 Abnormal results of liver function studies: Secondary | ICD-10-CM | POA: Insufficient documentation

## 2019-05-10 DIAGNOSIS — K760 Fatty (change of) liver, not elsewhere classified: Secondary | ICD-10-CM | POA: Insufficient documentation

## 2019-05-10 DIAGNOSIS — D696 Thrombocytopenia, unspecified: Secondary | ICD-10-CM | POA: Insufficient documentation

## 2019-05-10 DIAGNOSIS — G629 Polyneuropathy, unspecified: Secondary | ICD-10-CM | POA: Insufficient documentation

## 2019-05-10 LAB — CBC WITH DIFFERENTIAL/PLATELET
Abs Immature Granulocytes: 0.01 10*3/uL (ref 0.00–0.07)
Basophils Absolute: 0 10*3/uL (ref 0.0–0.1)
Basophils Relative: 1 %
Eosinophils Absolute: 0.3 10*3/uL (ref 0.0–0.5)
Eosinophils Relative: 6 %
HCT: 43.4 % (ref 39.0–52.0)
Hemoglobin: 14.6 g/dL (ref 13.0–17.0)
Immature Granulocytes: 0 %
Lymphocytes Relative: 43 %
Lymphs Abs: 2 10*3/uL (ref 0.7–4.0)
MCH: 29.6 pg (ref 26.0–34.0)
MCHC: 33.6 g/dL (ref 30.0–36.0)
MCV: 87.9 fL (ref 80.0–100.0)
Monocytes Absolute: 0.4 10*3/uL (ref 0.1–1.0)
Monocytes Relative: 8 %
Neutro Abs: 1.9 10*3/uL (ref 1.7–7.7)
Neutrophils Relative %: 42 %
Platelets: 130 10*3/uL — ABNORMAL LOW (ref 150–400)
RBC: 4.94 MIL/uL (ref 4.22–5.81)
RDW: 13.6 % (ref 11.5–15.5)
WBC: 4.6 10*3/uL (ref 4.0–10.5)
nRBC: 0 % (ref 0.0–0.2)

## 2019-05-10 LAB — COMPREHENSIVE METABOLIC PANEL
ALT: 137 U/L — ABNORMAL HIGH (ref 0–44)
AST: 61 U/L — ABNORMAL HIGH (ref 15–41)
Albumin: 4.3 g/dL (ref 3.5–5.0)
Alkaline Phosphatase: 45 U/L (ref 38–126)
Anion gap: 8 (ref 5–15)
BUN: 19 mg/dL (ref 6–20)
CO2: 26 mmol/L (ref 22–32)
Calcium: 9 mg/dL (ref 8.9–10.3)
Chloride: 107 mmol/L (ref 98–111)
Creatinine, Ser: 0.93 mg/dL (ref 0.61–1.24)
GFR calc Af Amer: >60 mL/min (ref >60)
GFR calc non Af Amer: >60 mL/min (ref >60)
Glucose, Bld: 141 mg/dL — ABNORMAL HIGH (ref 70–99)
Potassium: 3.8 mmol/L (ref 3.5–5.1)
Sodium: 141 mmol/L (ref 135–145)
Total Bilirubin: 0.7 mg/dL (ref 0.3–1.2)
Total Protein: 6.8 g/dL (ref 6.5–8.1)

## 2019-05-10 MED ORDER — IOHEXOL 300 MG/ML  SOLN
100.0000 mL | Freq: Once | INTRAMUSCULAR | Status: AC | PRN
  Administered 2019-05-10: 100 mL via INTRAVENOUS

## 2019-05-11 LAB — CEA: CEA: 2.5 ng/mL (ref 0.0–4.7)

## 2019-05-12 MED ORDER — OCTREOTIDE ACETATE 20 MG IM KIT
PACK | INTRAMUSCULAR | Status: AC
  Filled 2019-05-12: qty 1

## 2019-05-13 MED ORDER — OCTREOTIDE ACETATE 20 MG IM KIT
PACK | INTRAMUSCULAR | Status: AC
  Filled 2019-05-13: qty 1

## 2019-05-18 ENCOUNTER — Inpatient Hospital Stay (HOSPITAL_COMMUNITY): Payer: 59 | Attending: Hematology

## 2019-05-18 ENCOUNTER — Inpatient Hospital Stay (HOSPITAL_BASED_OUTPATIENT_CLINIC_OR_DEPARTMENT_OTHER): Payer: 59 | Admitting: Hematology

## 2019-05-18 ENCOUNTER — Other Ambulatory Visit: Payer: Self-pay

## 2019-05-18 ENCOUNTER — Encounter (HOSPITAL_COMMUNITY): Payer: Self-pay | Admitting: Hematology

## 2019-05-18 VITALS — BP 148/80 | HR 87 | Temp 97.1°F | Resp 18 | Wt 242.0 lb

## 2019-05-18 DIAGNOSIS — G629 Polyneuropathy, unspecified: Secondary | ICD-10-CM | POA: Insufficient documentation

## 2019-05-18 DIAGNOSIS — Z452 Encounter for adjustment and management of vascular access device: Secondary | ICD-10-CM | POA: Insufficient documentation

## 2019-05-18 DIAGNOSIS — Z79899 Other long term (current) drug therapy: Secondary | ICD-10-CM | POA: Insufficient documentation

## 2019-05-18 DIAGNOSIS — Z85038 Personal history of other malignant neoplasm of large intestine: Secondary | ICD-10-CM | POA: Insufficient documentation

## 2019-05-18 DIAGNOSIS — C187 Malignant neoplasm of sigmoid colon: Secondary | ICD-10-CM | POA: Diagnosis not present

## 2019-05-18 DIAGNOSIS — R7989 Other specified abnormal findings of blood chemistry: Secondary | ICD-10-CM | POA: Insufficient documentation

## 2019-05-18 MED ORDER — HEPARIN SOD (PORK) LOCK FLUSH 100 UNIT/ML IV SOLN
500.0000 [IU] | Freq: Once | INTRAVENOUS | Status: AC
  Administered 2019-05-18: 11:00:00 500 [IU] via INTRAVENOUS

## 2019-05-18 MED ORDER — SODIUM CHLORIDE 0.9% FLUSH
10.0000 mL | INTRAVENOUS | Status: DC | PRN
  Administered 2019-05-18: 10 mL via INTRAVENOUS

## 2019-05-18 NOTE — Progress Notes (Signed)
 [facility] [address] [place], [place] [address]   CLINIC:  Medical Oncology/Hematology  PCP:  Golding, John, MD [address] [place] [address] [phone]   REASON FOR VISIT:  Follow-up for stage III colon cancer  CURRENT THERAPY:Surveillance post adjuvant chemotherapy.   BRIEF ONCOLOGIC HISTORY:  Oncology History  Malignant neoplasm of sigmoid colon (HCC)   Initial Diagnosis   Cancer of sigmoid colon (HCC)   03/27/2018 Genetic Testing   ALK c.350C>G VUS identified on the multicancer panel.  The Multi-Gene Panel offered by [facility] includes sequencing and/or deletion duplication testing of the following 85 genes: AIP, ALK, APC, ATM, AXIN2,BAP1,  BARD1, BLM, BMPR1A, BRCA1, BRCA2, BRIP1, CASR, CDC73, CDH1, CDK4, CDKN1B, CDKN1C, CDKN2A (p14ARF), CDKN2A (p16INK4a), CEBPA, CHEK2, CTNNA1, DICER1, DIS3L2, EGFR (c.2369C>T, p.Thr790Met variant only), EPCAM (Deletion/duplication testing only), FH, FLCN, GATA2, GPC3, GREM1 (Promoter region deletion/duplication testing only), HOXB13 (c.251G>A, p.Gly84Glu), HRAS, KIT, MAX, MEN1, MET, MITF (c.952G>A, p.Glu318Lys variant only), MLH1, MSH2, MSH3, MSH6, MUTYH, NBN, NF1, NF2, NTHL1, PALB2, PDGFRA, PHOX2B, PMS2, POLD1, POLE, POT1, PRKAR1A, PTCH1, PTEN, RAD50, RAD51C, RAD51D, RB1, RECQL4, RET, RNF43, RUNX1, SDHAF2, SDHA (sequence changes only), SDHB, SDHC, SDHD, SMAD4, SMARCA4, SMARCB1, SMARCE1, STK11, SUFU, TERC, TERT, TMEM127, TP53, TSC1, TSC2, VHL, WRN and WT1.   The report date is 03/27/2018.   04/21/2018 -  Chemotherapy   The patient had palonosetron (ALOXI) injection 0.25 mg, 0.25 mg, Intravenous,  Once, 13 of 13 cycles Administration: 0.25 mg (04/21/2018), 0.25 mg (05/05/2018), 0.25 mg (05/19/2018), 0.25 mg (06/02/2018), 0.25 mg (06/17/2018), 0.25 mg (06/30/2018), 0.25 mg (07/13/2018), 0.25 mg (08/11/2018), 0.25 mg (08/25/2018), 0.25 mg (09/08/2018), 0.25 mg (09/22/2018), 0.25 mg (10/06/2018) leucovorin 800 mg in dextrose 5 % 250 mL  infusion, 876 mg, Intravenous,  Once, 13 of 13 cycles Administration: 800 mg (04/21/2018), 900 mg (05/05/2018), 800 mg (05/19/2018), 800 mg (06/02/2018), 900 mg (06/17/2018), 900 mg (06/30/2018), 900 mg (07/13/2018), 900 mg (08/11/2018), 900 mg (08/25/2018), 876 mg (09/22/2018), 900 mg (09/08/2018), 876 mg (10/06/2018) oxaliplatin (ELOXATIN) 185 mg in dextrose 5 % 500 mL chemo infusion, 85 mg/m2 = 185 mg, Intravenous,  Once, 13 of 13 cycles Dose modification: 68 mg/m2 (80 % of original dose 85 mg/m2, Cycle 12, Reason: Other (see comments), Comment: neuropathy), 42.5 mg/m2 (50 % of original dose 85 mg/m2, Cycle 13, Reason: Provider Judgment) Administration: 185 mg (04/21/2018), 185 mg (05/05/2018), 185 mg (05/19/2018), 185 mg (06/02/2018), 185 mg (06/17/2018), 185 mg (06/30/2018), 185 mg (07/13/2018), 185 mg (08/11/2018), 185 mg (08/25/2018), 185 mg (09/08/2018), 150 mg (09/22/2018), 95 mg (10/06/2018) fluorouracil (ADRUCIL) chemo injection 900 mg, 400 mg/m2 = 900 mg, Intravenous,  Once, 13 of 13 cycles Administration: 900 mg (04/21/2018), 900 mg (05/05/2018), 900 mg (05/19/2018), 900 mg (06/02/2018), 900 mg (06/17/2018), 900 mg (06/30/2018), 900 mg (07/13/2018), 900 mg (08/11/2018), 900 mg (08/25/2018), 900 mg (09/08/2018), 900 mg (09/22/2018), 900 mg (10/06/2018) fosaprepitant (EMEND) 150 mg, dexamethasone (DECADRON) 12 mg in sodium chloride 0.9 % 145 mL IVPB, , Intravenous,  Once, 10 of 10 cycles Administration:  (06/02/2018),  (06/17/2018),  (06/30/2018),  (07/13/2018),  (08/11/2018),  (08/25/2018),  (09/08/2018),  (09/22/2018),  (10/06/2018) fluorouracil (ADRUCIL) 5,250 mg in sodium chloride 0.9 % 145 mL chemo infusion, 2,400 mg/m2 = 5,250 mg, Intravenous, 1 Day/Dose, 13 of 13 cycles Administration: 5,250 mg (04/21/2018), 5,250 mg (05/05/2018), 5,250 mg (05/19/2018), 5,250 mg (06/02/2018), 5,250 mg (06/17/2018), 5,250 mg (06/30/2018), 5,250 mg (07/13/2018), 5,250 mg (08/11/2018), 5,250 mg (08/25/2018), 5,250 mg (09/08/2018), 5,250 mg (09/22/2018), 5,250 mg (10/06/2018)     for chemotherapy treatment.       CANCER STAGING: Cancer Staging No matching staging information was found for the patient.   INTERVAL HISTORY:  Mr. Martelle 44 y.o. male seen for follow-up of colon cancer.  Numbness in the hands and feet has been improving.  Appetite is 100%.  Energy levels are 75%.  Continue to work full-time.  No new onset pains reported.  Denies any nausea vomiting or diarrhea.  No change in bowel habits.  No bleeding per rectum or melena.    REVIEW OF SYSTEMS:  Review of Systems  Neurological: Positive for numbness.  All other systems reviewed and are negative.    PAST MEDICAL/SURGICAL HISTORY:  Past Medical History:  Diagnosis Date  . Family history of breast cancer   . GERD (gastroesophageal reflux disease)   . History of kidney stones   . Hypercholesterolemia   . Hypertension   . Urethral stricture    Past Surgical History:  Procedure Laterality Date  . BALLOON DILATION  04/06/2012   Procedure: BALLOON DILATION;  Surgeon: Reece Packer, MD;  Location: [facility];  Service: Urology;  Laterality: N/A;  . BIOPSY  02/24/2018   Procedure: BIOPSY;  Surgeon: Danie Binder, MD;  Location: [facility];  Service: Endoscopy;;  colon  . COLONOSCOPY WITH PROPOFOL N/A 02/24/2018   Dr. Oneida Alar: fungating, infiltrative partially obstructing mass in sigmoid colon, area tattooed. ext/int hemorrhoids. Bx: adenocarcinoma  . ESOPHAGOGASTRODUODENOSCOPY (EGD) WITH PROPOFOL N/A 08/29/2015   Dr. Oneida Alar: possible proximal esophageal web s/p dilation, moderate gastritis, negative eosinophilic esophagitis   . PARTIAL COLECTOMY N/A 03/02/2018   Procedure: PARTIAL COLECTOMY;  Surgeon: Aviva Signs, MD;  Location: [facility];  Service: General;  Laterality: N/A;  . PORTACATH PLACEMENT Left 04/13/2018   Procedure: INSERTION PORT-A-CATH;  Surgeon: Aviva Signs, MD;  Location: [facility];  Service: General;  Laterality: Left;  . SAVORY DILATION N/A 08/29/2015    Procedure: SAVORY DILATION;  Surgeon: Danie Binder, MD;  Location: [facility];  Service: Endoscopy;  Laterality: N/A;  . URETHRAL DILATION    . WISDOM TOOTH EXTRACTION       SOCIAL HISTORY:  Social History   Socioeconomic History  . Marital status: Divorced    Spouse name: Not on file  . Number of children: Not on file  . Years of education: Not on file  . Highest education level: Not on file  Occupational History  . Not on file  Tobacco Use  . Smoking status: Former Smoker    Packs/day: 0.25    Years: 9.00    Pack years: 2.25    Types: Cigarettes    Quit date: 07/27/2011    Years since quitting: 7.8  . Smokeless tobacco: Never Used  Substance and Sexual Activity  . Alcohol use: Yes    Alcohol/week: 6.0 standard drinks    Types: 6 Cans of beer per week  . Drug use: No  . Sexual activity: Yes    Birth control/protection: None  Other Topics Concern  . Not on file  Social History Narrative  . Not on file   Social Determinants of Health   Financial Resource Strain:   . Difficulty of Paying Living Expenses: Not on file  Food Insecurity:   . Worried About Charity fundraiser in the Last Year: Not on file  . Ran Out of Food in the Last Year: Not on file  Transportation Needs:   . Lack of Transportation (Medical): Not on file  .  Lack of Transportation (Non-Medical): Not on file  Physical Activity:   . Days of Exercise per Week: Not on file  . Minutes of Exercise per Session: Not on file  Stress:   . Feeling of Stress : Not on file  Social Connections:   . Frequency of Communication with Friends and Family: Not on file  . Frequency of Social Gatherings with Friends and Family: Not on file  . Attends Religious Services: Not on file  . Active Member of Clubs or Organizations: Not on file  . Attends Archivist Meetings: Not on file  . Marital Status: Not on file  Intimate Partner Violence:   . Fear of Current or Ex-Partner: Not on file  . Emotionally  Abused: Not on file  . Physically Abused: Not on file  . Sexually Abused: Not on file    FAMILY HISTORY:  Family History  Problem Relation Age of Onset  . Dementia Mother   . Dementia Maternal Aunt   . Dementia Maternal Grandmother   . COPD Maternal Grandfather   . Breast cancer Maternal Aunt 42  . Breast cancer Maternal Aunt        dx in her 73s  . Lung cancer Maternal Aunt   . Healthy Son   . Colon cancer Neg Hx     CURRENT MEDICATIONS:  Outpatient Encounter Medications as of 05/18/2019  Medication Sig  . CONTOUR NEXT TEST test strip USE TO CHECK BLOOD SUGAR BID  . glipiZIDE (GLUCOTROL XL) 5 MG 24 hr tablet TAKE 1 TABLET(5 MG) BY MOUTH DAILY WITH BREAKFAST  . losartan (COZAAR) 100 MG tablet Take 100 mg by mouth daily.  . Magnesium 500 MG CAPS Take 400 mg by mouth daily.   . Multiple Vitamin (MULTIVITAMIN) tablet Take 1 tablet by mouth daily.  Marland Kitchen omeprazole (PRILOSEC) 40 MG capsule Take 40 mg by mouth daily.  . Potassium (POTASSIMIN PO) Take 1 tablet by mouth daily.   . pravastatin (PRAVACHOL) 20 MG tablet Take 20 mg by mouth daily.   Marland Kitchen topiramate (TOPAMAX) 100 MG tablet Take 100 mg by mouth 2 (two) times daily.  . [DISCONTINUED] topiramate (TOPAMAX) 100 MG tablet Take 100 mg by mouth daily.   Marland Kitchen albuterol (VENTOLIN HFA) 108 (90 Base) MCG/ACT inhaler Inhale 2 puffs into the lungs every 6 (six) hours as needed.   . benzonatate (TESSALON) 200 MG capsule Take 200 mg by mouth as needed.   . [DISCONTINUED] Ascorbic Acid (VITAMIN C) 1000 MG tablet Take 1,000 mg by mouth. Twice a week  . [DISCONTINUED] chlorpheniramine-HYDROcodone ([place]) 10-8 MG/5ML SUER SMARTSIG:5 Milliliter(s) By Mouth Every 12 Hours PRN  . [DISCONTINUED] esomeprazole (NEXIUM) 40 MG capsule Take 40 mg by mouth daily.   . [DISCONTINUED] glipiZIDE (GLUCOTROL XL) 5 MG 24 hr tablet TAKE 1 TABLET(5 MG) BY MOUTH DAILY WITH BREAKFAST  . [DISCONTINUED] lisinopril (PRINIVIL,ZESTRIL) 10 MG tablet Take 10 mg by mouth daily.    . [DISCONTINUED] Magnesium Gluconate (MAGNESIUM 27) 500 (27 Mg) MG TABS Take by mouth.  . [DISCONTINUED] Na Sulfate-K Sulfate-Mg Sulf (SUPREP BOWEL PREP KIT) 17.5-3.13-1.6 GM/177ML SOLN Take 1 kit by mouth as directed.  . [DISCONTINUED] predniSONE (DELTASONE) 10 MG tablet    Facility-Administered Encounter Medications as of 05/18/2019  Medication  . sodium chloride flush (NS) 0.9 % injection 10 mL    ALLERGIES:  No Known Allergies   PHYSICAL EXAM:  ECOG Performance status: 1  Vitals:   05/18/19 1007  BP: (!) 148/80  Pulse: 87  Resp: 18  Temp: (!) 97.1 F (36.2 C)  SpO2: 98%   Filed Weights   05/18/19 1007  Weight: 242 lb (109.8 kg)    Physical Exam Vitals reviewed.  Constitutional:      Appearance: Normal appearance.  Cardiovascular:     Rate and Rhythm: Normal rate and regular rhythm.     Heart sounds: Normal heart sounds.  Pulmonary:     Effort: Pulmonary effort is normal.     Breath sounds: Normal breath sounds.  Abdominal:     General: There is no distension.     Palpations: Abdomen is soft. There is no mass.  Musculoskeletal:        General: No swelling.  Skin:    General: Skin is warm.  Neurological:     General: No focal deficit present.     Mental Status: He is alert and oriented to person, place, and time.  Psychiatric:        Mood and Affect: Mood normal.        Behavior: Behavior normal.      LABORATORY DATA:  I have reviewed the labs as listed.  CBC    Component Value Date/Time   WBC 4.6 05/10/2019 0908   RBC 4.94 05/10/2019 0908   HGB 14.6 05/10/2019 0908   HCT 43.4 05/10/2019 0908   PLT 130 (L) 05/10/2019 0908   MCV 87.9 05/10/2019 0908   MCH 29.6 05/10/2019 0908   MCHC 33.6 05/10/2019 0908   RDW 13.6 05/10/2019 0908   LYMPHSABS 2.0 05/10/2019 0908   MONOABS 0.4 05/10/2019 0908   EOSABS 0.3 05/10/2019 0908   BASOSABS 0.0 05/10/2019 0908   CMP Latest Ref Rng & Units 05/10/2019 01/27/2019 10/23/2018  Glucose 70 - 99 mg/dL 141(H) 97  144(H)  BUN 6 - 20 mg/dL '19 18 18  '$ Creatinine 0.61 - 1.24 mg/dL 0.93 0.87 0.78  Sodium 135 - 145 mmol/L 141 137 138  Potassium 3.5 - 5.1 mmol/L 3.8 3.7 4.3  Chloride 98 - 111 mmol/L 107 107 108  CO2 22 - 32 mmol/L '26 22 23  '$ Calcium 8.9 - 10.3 mg/dL 9.0 8.8(L) 9.0  Total Protein 6.5 - 8.1 g/dL 6.8 6.9 6.6  Total Bilirubin 0.3 - 1.2 mg/dL 0.7 0.6 0.8  Alkaline Phos 38 - 126 U/L 45 44 52  AST 15 - 41 U/L 61(H) 28 35  ALT 0 - 44 U/L 137(H) 54(H) 56(H)       DIAGNOSTIC IMAGING:  I have independently reviewed the scans and discussed with the patient.      ASSESSMENT & PLAN:   Malignant neoplasm of sigmoid colon ([place]) 1.  Stage III (PT3PN2B) sigmoid colon adenocarcinoma, MSI-stable: - Status post sigmoid colon segmental resection on 03/02/2018 by Dr. Arnoldo Morale, 14/35 lymph nodes positive. - PET CT scan on 03/30/2018 showed bilateral hypermetabolic pelvic sidewall lymph nodes.  There are subcentimeter not in the initial distribution pattern for sigmoid primary.  Lymph nodes are in the bilateral external iliac and right inguinal lymph node. -12 cycles of FOLFOX from 04/21/2018 through 10/06/2018. -Denies any change in bowel habits.  No bleeding per rectum or melena. -He is scheduled for repeat colonoscopy on 06/01/2019. -I reviewed CTAP from 05/10/2019.  Hepatic steatosis present.  No clear evidence of recurrence or metastatic disease. -CEA was 2.5.  I have reviewed his CBC which were within normal limits.  Mild thrombocytopenia of 130 is stable. -He will come back in 3 months for follow-up.  We will repeat CEA level at  that time.  I plan to repeat CT of the chest, abdomen and pelvis in 6 months.   2.  Peripheral neuropathy: -He has numbness in the fingertips and toes which is continuing to improve.  No medication needed.  3.  Elevated LFTs: -Most recent blood work showed AST of 61 and ALT of 137. -This is likely from fatty liver as noticed on the CT from 05/10/2019.      Orders placed  this encounter:  Orders Placed This Encounter  Procedures  . CBC with Differential/Platelet  . Comprehensive metabolic panel  . CEA      Derek Jack, MD [place] [phone]

## 2019-05-18 NOTE — Patient Instructions (Addendum)
[facility] at [facility] Discharge Instructions  You were seen today by Dr. Katragadda. He went over your recent test results. He will see you back in 3 months for labs and follow up.   Thank you for choosing [facility] at [facility] to provide your oncology and hematology care.  To afford each patient quality time with our provider, please arrive at least 15 minutes before your scheduled appointment time.   If you have a lab appointment with the Cancer Center please come in thru the  Main Entrance and check in at the main information desk  You need to re-schedule your appointment should you arrive 10 or more minutes late.  We strive to give you quality time with our providers, and arriving late affects you and other patients whose appointments are after yours.  Also, if you no show three or more times for appointments you may be dismissed from the clinic at the providers discretion.     Again, thank you for choosing [facility].  Our hope is that these requests will decrease the amount of time that you wait before being seen by our physicians.       _____________________________________________________________  Should you have questions after your visit to [facility], please contact our office at [phone] between the hours of 8:00 a.m. and 4:30 p.m.  Voicemails left after 4:00 p.m. will not be returned until the following business day.  For prescription refill requests, have your pharmacy contact our office and allow 72 hours.    Cancer Center Support Programs:   > Cancer Support Group  2nd Tuesday of the month 1pm-2pm, Journey Room    

## 2019-05-22 NOTE — Assessment & Plan Note (Signed)
1.  Stage III (PT3PN2B) sigmoid colon adenocarcinoma, MSI-stable: - Status post sigmoid colon segmental resection on 03/02/2018 by Dr. Arnoldo Morale, 14/35 lymph nodes positive. - PET CT scan on 03/30/2018 showed bilateral hypermetabolic pelvic sidewall lymph nodes.  There are subcentimeter not in the initial distribution pattern for sigmoid primary.  Lymph nodes are in the bilateral external iliac and right inguinal lymph node. -12 cycles of FOLFOX from 04/21/2018 through 10/06/2018. -Denies any change in bowel habits.  No bleeding per rectum or melena. -He is scheduled for repeat colonoscopy on 06/01/2019. -I reviewed CTAP from 05/10/2019.  Hepatic steatosis present.  No clear evidence of recurrence or metastatic disease. -CEA was 2.5.  I have reviewed his CBC which were within normal limits.  Mild thrombocytopenia of 130 is stable. -He will come back in 3 months for follow-up.  We will repeat CEA level at that time.  I plan to repeat CT of the chest, abdomen and pelvis in 6 months.   2.  Peripheral neuropathy: -He has numbness in the fingertips and toes which is continuing to improve.  No medication needed.  3.  Elevated LFTs: -Most recent blood work showed AST of 61 and ALT of 137. -This is likely from fatty liver as noticed on the CT from 05/10/2019.

## 2019-05-31 ENCOUNTER — Other Ambulatory Visit: Payer: Self-pay

## 2019-05-31 ENCOUNTER — Encounter (HOSPITAL_COMMUNITY): Payer: Self-pay

## 2019-05-31 ENCOUNTER — Encounter (HOSPITAL_COMMUNITY)
Admission: RE | Admit: 2019-05-31 | Discharge: 2019-05-31 | Disposition: A | Discharge status: Home / Self Care | Payer: 59 | Source: Ambulatory Visit | Attending: Gastroenterology | Admitting: Gastroenterology

## 2019-05-31 ENCOUNTER — Other Ambulatory Visit (HOSPITAL_COMMUNITY): Payer: PRIVATE HEALTH INSURANCE

## 2019-05-31 HISTORY — DX: Type 2 diabetes mellitus without complications: E11.9

## 2019-05-31 NOTE — Patient Instructions (Signed)
Joel Jefferson.  05/31/2019     @PREFPERIOPPHARMACY @   Your procedure is scheduled on  06/01/2019   Report to [facility] at  [place].M.  Call this number if you have problems the morning of surgery:  [phone]   Remember:  Follow the diet and prep instructions given to you by Dr Oneida Alar office.                     Take these medicines the morning of surgery with A SIP OF WATER  Losartan , prilosec, topamax.    Do not wear jewelry, make-up or nail polish.  Do not wear lotions, powders, or perfumes. Please wear deodorant and brush your teeth.  Do not shave 48 hours prior to surgery.  Men may shave face and neck.  Do not bring valuables to the hospital.  [facility] is not responsible for any belongings or valuables.  Contacts, dentures or bridgework may not be worn into surgery.  Leave your suitcase in the car.  After surgery it may be brought to your room.  For patients admitted to the hospital, discharge time will be determined by your treatment team.  Patients discharged the day of surgery will not be allowed to drive home.   Name and phone number of your driver:   family Special instructions:  DO NOT smoke the morning of your procedure.  Please read over the following fact sheets that you were given. Anesthesia Post-op Instructions and Care and Recovery After Surgery       Colonoscopy, Adult, Care After This sheet gives you information about how to care for yourself after your procedure. Your health care provider may also give you more specific instructions. If you have problems or questions, contact your health care provider. What can I expect after the procedure? After the procedure, it is common to have:  A small amount of blood in your stool for 24 hours after the procedure.  Some gas.  Mild cramping or bloating of your abdomen. Follow these instructions at home: Eating and drinking   Drink enough fluid to keep your urine pale  yellow.  Follow instructions from your health care provider about eating or drinking restrictions.  Resume your normal diet as instructed by your health care provider. Avoid heavy or fried foods that are hard to digest. Activity  Rest as told by your health care provider.  Avoid sitting for a long time without moving. Get up to take short walks every 1-2 hours. This is important to improve blood flow and breathing. Ask for help if you feel weak or unsteady.  Return to your normal activities as told by your health care provider. Ask your health care provider what activities are safe for you. Managing cramping and bloating   Try walking around when you have cramps or feel bloated.  Apply heat to your abdomen as told by your health care provider. Use the heat source that your health care provider recommends, such as a moist heat pack or a heating pad. ? Place a towel between your skin and the heat source. ? Leave the heat on for 20-30 minutes. ? Remove the heat if your skin turns bright red. This is especially important if you are unable to feel pain, heat, or cold. You may have a greater risk of getting burned. General instructions  For the first 24 hours after the procedure: ? Do not drive or use machinery. ?  Do not sign important documents. ? Do not drink alcohol. ? Do your regular daily activities at a slower pace than normal. ? Eat soft foods that are easy to digest.  Take over-the-counter and prescription medicines only as told by your health care provider.  Keep all follow-up visits as told by your health care provider. This is important. Contact a health care provider if:  You have blood in your stool 2-3 days after the procedure. Get help right away if you have:  More than a small spotting of blood in your stool.  Large blood clots in your stool.  Swelling of your abdomen.  Nausea or vomiting.  A fever.  Increasing pain in your abdomen that is not relieved with  medicine. Summary  After the procedure, it is common to have a small amount of blood in your stool. You may also have mild cramping and bloating of your abdomen.  For the first 24 hours after the procedure, do not drive or use machinery, sign important documents, or drink alcohol.  Get help right away if you have a lot of blood in your stool, nausea or vomiting, a fever, or increased pain in your abdomen. This information is not intended to replace advice given to you by your health care provider. Make sure you discuss any questions you have with your health care provider. Document Revised: 10/19/2018 Document Reviewed: 10/19/2018 Elsevier Patient Education  [place] After These instructions provide you with information about caring for yourself after your procedure. Your health care provider may also give you more specific instructions. Your treatment has been planned according to current medical practices, but problems sometimes occur. Call your health care provider if you have any problems or questions after your procedure. What can I expect after the procedure? After your procedure, you may:  Feel sleepy for several hours.  Feel clumsy and have poor balance for several hours.  Feel forgetful about what happened after the procedure.  Have poor judgment for several hours.  Feel nauseous or vomit.  Have a sore throat if you had a breathing tube during the procedure. Follow these instructions at home: For at least 24 hours after the procedure:      Have a responsible adult stay with you. It is important to have someone help care for you until you are awake and alert.  Rest as needed.  Do not: ? Participate in activities in which you could fall or become injured. ? Drive. ? Use heavy machinery. ? Drink alcohol. ? Take sleeping pills or medicines that cause drowsiness. ? Make important decisions or sign legal documents. ? Take care  of children on your own. Eating and drinking  Follow the diet that is recommended by your health care provider.  If you vomit, drink water, juice, or soup when you can drink without vomiting.  Make sure you have little or no nausea before eating solid foods. General instructions  Take over-the-counter and prescription medicines only as told by your health care provider.  If you have sleep apnea, surgery and certain medicines can increase your risk for breathing problems. Follow instructions from your health care provider about wearing your sleep device: ? Anytime you are sleeping, including during daytime naps. ? While taking prescription pain medicines, sleeping medicines, or medicines that make you drowsy.  If you smoke, do not smoke without supervision.  Keep all follow-up visits as told by your health care provider. This is important. Contact a health  care provider if:  You keep feeling nauseous or you keep vomiting.  You feel light-headed.  You develop a rash.  You have a fever. Get help right away if:  You have trouble breathing. Summary  For several hours after your procedure, you may feel sleepy and have poor judgment.  Have a responsible adult stay with you for at least 24 hours or until you are awake and alert. This information is not intended to replace advice given to you by your health care provider. Make sure you discuss any questions you have with your health care provider. Document Revised: 06/23/2017 Document Reviewed: 07/16/2015 Elsevier Patient Education  [place].

## 2019-06-01 ENCOUNTER — Ambulatory Visit (HOSPITAL_COMMUNITY): Payer: 59 | Admitting: Anesthesiology

## 2019-06-01 ENCOUNTER — Other Ambulatory Visit: Payer: Self-pay

## 2019-06-01 ENCOUNTER — Encounter (HOSPITAL_COMMUNITY): Payer: Self-pay | Admitting: Gastroenterology

## 2019-06-01 ENCOUNTER — Ambulatory Visit (HOSPITAL_COMMUNITY)
Admission: RE | Admit: 2019-06-01 | Discharge: 2019-06-01 | Disposition: A | Discharge status: Home / Self Care | Payer: 59 | Attending: Gastroenterology | Admitting: Gastroenterology

## 2019-06-01 ENCOUNTER — Encounter (HOSPITAL_COMMUNITY)
Admission: RE | Disposition: A | Discharge status: Home / Self Care | Payer: Self-pay | Source: Home / Self Care | Attending: Gastroenterology

## 2019-06-01 DIAGNOSIS — E7800 Pure hypercholesterolemia, unspecified: Secondary | ICD-10-CM | POA: Insufficient documentation

## 2019-06-01 DIAGNOSIS — Z1509 Genetic susceptibility to other malignant neoplasm: Secondary | ICD-10-CM | POA: Insufficient documentation

## 2019-06-01 DIAGNOSIS — Q438 Other specified congenital malformations of intestine: Secondary | ICD-10-CM | POA: Diagnosis not present

## 2019-06-01 DIAGNOSIS — I10 Essential (primary) hypertension: Secondary | ICD-10-CM | POA: Insufficient documentation

## 2019-06-01 DIAGNOSIS — Z9189 Other specified personal risk factors, not elsewhere classified: Secondary | ICD-10-CM

## 2019-06-01 DIAGNOSIS — D490 Neoplasm of unspecified behavior of digestive system: Secondary | ICD-10-CM

## 2019-06-01 DIAGNOSIS — E119 Type 2 diabetes mellitus without complications: Secondary | ICD-10-CM | POA: Insufficient documentation

## 2019-06-01 DIAGNOSIS — Z79899 Other long term (current) drug therapy: Secondary | ICD-10-CM | POA: Insufficient documentation

## 2019-06-01 DIAGNOSIS — Z7984 Long term (current) use of oral hypoglycemic drugs: Secondary | ICD-10-CM | POA: Diagnosis not present

## 2019-06-01 DIAGNOSIS — Z85038 Personal history of other malignant neoplasm of large intestine: Secondary | ICD-10-CM

## 2019-06-01 DIAGNOSIS — Z8616 Personal history of COVID-19: Secondary | ICD-10-CM | POA: Insufficient documentation

## 2019-06-01 DIAGNOSIS — Z801 Family history of malignant neoplasm of trachea, bronchus and lung: Secondary | ICD-10-CM | POA: Diagnosis not present

## 2019-06-01 DIAGNOSIS — Z87891 Personal history of nicotine dependence: Secondary | ICD-10-CM | POA: Diagnosis not present

## 2019-06-01 DIAGNOSIS — K219 Gastro-esophageal reflux disease without esophagitis: Secondary | ICD-10-CM | POA: Diagnosis not present

## 2019-06-01 DIAGNOSIS — Z803 Family history of malignant neoplasm of breast: Secondary | ICD-10-CM | POA: Diagnosis not present

## 2019-06-01 DIAGNOSIS — K644 Residual hemorrhoidal skin tags: Secondary | ICD-10-CM | POA: Diagnosis not present

## 2019-06-01 DIAGNOSIS — C189 Malignant neoplasm of colon, unspecified: Secondary | ICD-10-CM | POA: Insufficient documentation

## 2019-06-01 DIAGNOSIS — K648 Other hemorrhoids: Secondary | ICD-10-CM | POA: Insufficient documentation

## 2019-06-01 DIAGNOSIS — Z9049 Acquired absence of other specified parts of digestive tract: Secondary | ICD-10-CM | POA: Insufficient documentation

## 2019-06-01 HISTORY — PX: ESOPHAGOGASTRODUODENOSCOPY (EGD) WITH PROPOFOL: SHX5813

## 2019-06-01 HISTORY — PX: COLONOSCOPY WITH PROPOFOL: SHX5780

## 2019-06-01 HISTORY — PX: BIOPSY: SHX5522

## 2019-06-01 LAB — GLUCOSE, CAPILLARY
Glucose-Capillary: 133 mg/dL — ABNORMAL HIGH (ref 70–99)
Glucose-Capillary: 119 mg/dL — ABNORMAL HIGH (ref 70–99)

## 2019-06-01 SURGERY — COLONOSCOPY WITH PROPOFOL
Anesthesia: General

## 2019-06-01 MED ORDER — HYDROMORPHONE HCL 1 MG/ML IJ SOLN: 0.2500 mg | INTRAMUSCULAR | Status: DC | PRN

## 2019-06-01 MED ORDER — HYDROCODONE-ACETAMINOPHEN 7.5-325 MG PO TABS: 1.0000 | ORAL_TABLET | Freq: Once | ORAL | Status: DC | PRN

## 2019-06-01 MED ORDER — LACTATED RINGERS IV SOLN: INTRAVENOUS | Status: DC | PRN

## 2019-06-01 MED ORDER — PROPOFOL 10 MG/ML IV BOLUS
INTRAVENOUS | Status: DC | PRN
  Administered 2019-06-01: 100 ug via INTRAVENOUS

## 2019-06-01 MED ORDER — PROMETHAZINE HCL 25 MG/ML IJ SOLN: 6.2500 mg | INTRAMUSCULAR | Status: DC | PRN

## 2019-06-01 MED ORDER — MIDAZOLAM HCL 2 MG/2ML IJ SOLN: 0.5000 mg | Freq: Once | INTRAMUSCULAR | Status: DC | PRN

## 2019-06-01 MED ORDER — PROPOFOL 10 MG/ML IV BOLUS
INTRAVENOUS | Status: AC
  Filled 2019-06-01: qty 20

## 2019-06-01 MED ORDER — KETAMINE HCL 50 MG/5ML IJ SOSY
PREFILLED_SYRINGE | INTRAMUSCULAR | Status: AC
  Filled 2019-06-01: qty 5

## 2019-06-01 MED ORDER — KETAMINE HCL 10 MG/ML IJ SOLN
INTRAMUSCULAR | Status: DC | PRN
  Administered 2019-06-01: 20 mg via INTRAVENOUS

## 2019-06-01 MED ORDER — PROPOFOL 500 MG/50ML IV EMUL
INTRAVENOUS | Status: DC | PRN
  Administered 2019-06-01: 200 ug/kg/min via INTRAVENOUS

## 2019-06-01 MED ORDER — PHENYLEPHRINE HCL (PRESSORS) 10 MG/ML IV SOLN
INTRAVENOUS | Status: DC | PRN
  Administered 2019-06-01: 80 ug via INTRAVENOUS
  Administered 2019-06-01: 40 ug via INTRAVENOUS

## 2019-06-01 MED ORDER — PHENYLEPHRINE 40 MCG/ML (10ML) SYRINGE FOR IV PUSH (FOR BLOOD PRESSURE SUPPORT)
PREFILLED_SYRINGE | INTRAVENOUS | Status: AC
  Filled 2019-06-01: qty 10

## 2019-06-01 MED ORDER — STERILE WATER FOR IRRIGATION IR SOLN
Status: DC | PRN
  Administered 2019-06-01: 1.5 mL

## 2019-06-01 NOTE — Op Note (Signed)
[facility] Patient Name: Joel Jefferson Procedure Date: 06/01/2019 9:40 AM MRN: [id number] Date of Birth: October 14, 1975 Attending MD: Barney Drain MD, MD CSN: [id number] Age: 44 Admit Type: Outpatient Procedure:                Upper GI endoscopy WITH COLD FORCEPS BIOPSY Indications:              Personal history of malignant neoplasm of the GI                            tract-premature COLON CANCER, HIGH RISK FOR GENETIC                            COLON CANCER SYNDROME. Providers:                Barney Drain MD, MD, Charlsie Quest. Theda Sers RN, RN,                            Randa Spike, Technician Referring MD:             Derek Jack, MD Medicines:                Propofol per Anesthesia Complications:            No immediate complications. Estimated Blood Loss:     Estimated blood loss: none. Procedure:                Pre-Anesthesia Assessment:                           - Prior to the procedure, a History and Physical                            was performed, and patient medications and                            allergies were reviewed. The patient's tolerance of                            previous anesthesia was also reviewed. The risks                            and benefits of the procedure and the sedation                            options and risks were discussed with the patient.                            All questions were answered, and informed consent                            was obtained. Prior Anticoagulants: The patient has                            taken no previous anticoagulant or antiplatelet  agents. ASA Grade Assessment: II - A patient with                            mild systemic disease. After reviewing the risks                            and benefits, the patient was deemed in                            satisfactory condition to undergo the procedure.                            After obtaining informed consent, the  endoscope was                            passed under direct vision. Throughout the                            procedure, the patient's blood pressure, pulse, and                            oxygen saturations were monitored continuously. The                            [id number] scope was introduced through the                            mouth, and advanced to the second part of duodenum.                            The upper GI endoscopy was accomplished without                            difficulty. The patient tolerated the procedure                            well. Scope In: 9:42:18 AM Scope Out: 9:46:00 AM Total Procedure Duration: 0 hours 3 minutes 42 seconds  Findings:      The examined esophagus was normal.      The entire examined stomach was normal.      The examined duodenum was normal. Impression:               - Normal esophagus.                           - Normal stomach.                           - Normal examined duodenum. Moderate Sedation:      Per Anesthesia Care Recommendation:           - Patient has a contact number available for                            emergencies. The signs and symptoms of potential  delayed complications were discussed with the                            patient. Return to normal activities tomorrow.                            Written discharge instructions were provided to the                            patient.                           - High fiber diet.                           - Continue present medications.                           - Await pathology results. Procedure Code(s):        --- Professional ---                           [phone], Esophagogastroduodenoscopy, flexible,                            transoral; diagnostic, including collection of                            specimen(s) by brushing or washing, when performed                            (separate procedure) Diagnosis Code(s):         --- Professional ---                           Z85.00, Personal history of malignant neoplasm of                            unspecified digestive organ CPT copyright 2019 [employer]. All rights reserved. The codes documented in this report are preliminary and upon coder review may  be revised to meet current compliance requirements. Barney Drain, MD Barney Drain MD, MD 06/01/2019 10:13:04 AM This report has been signed electronically. Number of Addenda: 0

## 2019-06-01 NOTE — H&P (Signed)
Primary Care Physician:  Sharilyn Sites, MD Primary Gastroenterologist:  Dr. Oneida Alar  Pre-Procedure History & Physical: HPI:  Joel Jefferson. is a 44 y.o. male here for PERSONAL HISTORY OF COLON CANCER.  Past Medical History:  Diagnosis Date  . Diabetes mellitus without complication ([place])   . Family history of breast cancer   . GERD (gastroesophageal reflux disease)   . History of kidney stones   . Hypercholesterolemia   . Hypertension   . Urethral stricture     Past Surgical History:  Procedure Laterality Date  . BALLOON DILATION  04/06/2012   Procedure: BALLOON DILATION;  Surgeon: Reece Packer, MD;  Location: [facility];  Service: Urology;  Laterality: N/A;  . BIOPSY  02/24/2018   Procedure: BIOPSY;  Surgeon: Danie Binder, MD;  Location: [facility];  Service: Endoscopy;;  colon  . COLONOSCOPY WITH PROPOFOL N/A 02/24/2018   Dr. Oneida Alar: fungating, infiltrative partially obstructing mass in sigmoid colon, area tattooed. ext/int hemorrhoids. Bx: adenocarcinoma  . ESOPHAGOGASTRODUODENOSCOPY (EGD) WITH PROPOFOL N/A 08/29/2015   Dr. Oneida Alar: possible proximal esophageal web s/p dilation, moderate gastritis, negative eosinophilic esophagitis   . PARTIAL COLECTOMY N/A 03/02/2018   Procedure: PARTIAL COLECTOMY;  Surgeon: Aviva Signs, MD;  Location: [facility];  Service: General;  Laterality: N/A;  . PORTACATH PLACEMENT Left 04/13/2018   Procedure: INSERTION PORT-A-CATH;  Surgeon: Aviva Signs, MD;  Location: [facility];  Service: General;  Laterality: Left;  . SAVORY DILATION N/A 08/29/2015   Procedure: SAVORY DILATION;  Surgeon: Danie Binder, MD;  Location: [facility];  Service: Endoscopy;  Laterality: N/A;  . URETHRAL DILATION    . WISDOM TOOTH EXTRACTION      Prior to Admission medications   Medication Sig Start Date End Date Taking? Authorizing Provider  benzonatate (TESSALON) 200 MG capsule Take 200 mg by mouth 2 (two) times daily as needed for cough.   04/28/19  Yes [provider]  glipiZIDE (GLUCOTROL XL) 5 MG 24 hr tablet TAKE 1 TABLET(5 MG) BY MOUTH DAILY WITH BREAKFAST Patient taking differently: Take 5 mg by mouth daily with breakfast.  04/12/19  Yes Roger Shelter, FNP  losartan (COZAAR) 100 MG tablet Take 100 mg by mouth daily. 04/28/19  Yes [provider]  magnesium oxide (MAG-OX) 400 MG tablet Take 400 mg by mouth daily.   Yes [provider]  Multiple Vitamin (MULTIVITAMIN) tablet Take 1 tablet by mouth daily.   Yes [provider]  omeprazole (PRILOSEC) 40 MG capsule Take 40 mg by mouth daily. 04/01/19  Yes [provider]  Potassium 99 MG TABS Take 99 mg by mouth daily.   Yes [provider]  pravastatin (PRAVACHOL) 20 MG tablet Take 20 mg by mouth daily.  08/30/14  Yes [provider]  topiramate (TOPAMAX) 100 MG tablet Take 100 mg by mouth daily.    Yes [provider]  albuterol (VENTOLIN HFA) 108 (90 Base) MCG/ACT inhaler Inhale 2 puffs into the lungs every 6 (six) hours as needed for wheezing or shortness of breath.  05/13/19   [provider]  CONTOUR NEXT TEST test strip USE TO CHECK BLOOD SUGAR BID 08/25/18   [provider]  glipiZIDE (GLUCOTROL XL) 5 MG 24 hr tablet TAKE 1 TABLET(5 MG) BY MOUTH DAILY WITH BREAKFAST 12/07/18   Roger Shelter, FNP    Allergies as of 02/15/2019  . (No Known Allergies)    Family History  Problem Relation Age of Onset  .  Dementia Mother   . Dementia Maternal Aunt   . Dementia Maternal Grandmother   . COPD Maternal Grandfather   . Breast cancer Maternal Aunt 42  . Breast cancer Maternal Aunt        dx in her 67s  . Lung cancer Maternal Aunt   . Healthy Son   . Colon cancer Neg Hx     Social History   Socioeconomic History  . Marital status: Divorced    Spouse name: Not on file  . Number of children: Not on file  . Years of education: Not on file  . Highest education level: Not on file   Occupational History  . Not on file  Tobacco Use  . Smoking status: Former Smoker    Packs/day: 0.25    Years: 9.00    Pack years: 2.25    Types: Cigarettes    Quit date: 07/27/2011    Years since quitting: 7.8  . Smokeless tobacco: Never Used  Substance and Sexual Activity  . Alcohol use: Yes    Alcohol/week: 6.0 standard drinks    Types: 6 Cans of beer per week  . Drug use: No  . Sexual activity: Yes    Birth control/protection: None  Other Topics Concern  . Not on file  Social History Narrative  . Not on file   Social Determinants of Health   Financial Resource Strain:   . Difficulty of Paying Living Expenses: Not on file  Food Insecurity:   . Worried About Charity fundraiser in the Last Year: Not on file  . Ran Out of Food in the Last Year: Not on file  Transportation Needs:   . Lack of Transportation (Medical): Not on file  . Lack of Transportation (Non-Medical): Not on file  Physical Activity:   . Days of Exercise per Week: Not on file  . Minutes of Exercise per Session: Not on file  Stress:   . Feeling of Stress : Not on file  Social Connections:   . Frequency of Communication with Friends and Family: Not on file  . Frequency of Social Gatherings with Friends and Family: Not on file  . Attends Religious Services: Not on file  . Active Member of Clubs or Organizations: Not on file  . Attends Archivist Meetings: Not on file  . Marital Status: Not on file  Intimate Partner Violence:   . Fear of Current or Ex-Partner: Not on file  . Emotionally Abused: Not on file  . Physically Abused: Not on file  . Sexually Abused: Not on file    Review of Systems: See HPI, otherwise negative ROS   Physical Exam: BP 131/90   Pulse 81   Temp 98.4 F (36.9 C) (Oral)   Resp 17   SpO2 98%  General:   Alert,  pleasant and cooperative in NAD Head:  Normocephalic and atraumatic. Neck:  Supple; Lungs:  Clear throughout to auscultation.    Heart:  Regular  rate and rhythm. Abdomen:  Soft, nontender and nondistended. Normal bowel sounds, without guarding, and without rebound.   Neurologic:  Alert and  oriented x4;  grossly normal neurologically.  Impression/Plan:     PERSONAL HISTORY OF COLON CANCER  PLAN: 1. TCS TODAY. DISCUSSED PROCEDURE, BENEFITS, & RISKS: < 1% chance of medication reaction, bleeding, perforation, or rupture of spleen/liver.

## 2019-06-01 NOTE — Op Note (Signed)
[facility] Patient Name: Joel Jefferson Procedure Date: 06/01/2019 8:43 AM MRN: [id number] Date of Birth: October 23, 1975 Attending MD: Barney Drain MD, MD CSN: [id number] Age: 44 Admit Type: Outpatient Procedure:                Colonoscopy WITH COLD FORCEPS BIOPSY Indications:              Personal history of malignant neoplasm of the colon Providers:                Barney Drain MD, MD, Otis Peak B. Sharon Seller, RN, Aram Candela Referring MD:             Derek Jack, MD Medicines:                Propofol per Anesthesia Complications:            No immediate complications. Estimated Blood Loss:     Estimated blood loss was minimal. Procedure:                Pre-Anesthesia Assessment:                           - Prior to the procedure, a History and Physical                            was performed, and patient medications and                            allergies were reviewed. The patient's tolerance of                            previous anesthesia was also reviewed. The risks                            and benefits of the procedure and the sedation                            options and risks were discussed with the patient.                            All questions were answered, and informed consent                            was obtained. Prior Anticoagulants: The patient has                            taken no previous anticoagulant or antiplatelet                            agents. ASA Grade Assessment: II - A patient with                            mild systemic disease. After reviewing the risks  and benefits, the patient was deemed in                            satisfactory condition to undergo the procedure.                            After obtaining informed consent, the colonoscope                            was passed under direct vision. Throughout the                            procedure, the patient's blood  pressure, pulse, and                            oxygen saturations were monitored continuously. The                            [id number] scope was introduced through                            the anus and advanced to the the cecum, identified                            by appendiceal orifice and ileocecal valve. The                            colonoscopy was somewhat difficult due to a                            tortuous colon. Successful completion of the                            procedure was aided by straightening and shortening                            the scope to obtain bowel loop reduction and                            COLOWRAP. The patient tolerated the procedure well.                            The quality of the bowel preparation was excellent.                            The ileocecal valve, appendiceal orifice, and                            rectum were photographed. Scope In: 9:22:51 AM Scope Out: 9:36:53 AM Scope Withdrawal Time: 0 hours 12 minutes 35 seconds  Total Procedure Duration: 0 hours 14 minutes 2 seconds  Findings:      A polypoid lesion was found at the anastomosis. The lesion was polypoid       and ulcerated.  No bleeding was present. Biopsies were taken with a cold       forceps for histology.      External and internal hemorrhoids were found. The hemorrhoids were       moderate.      The recto-sigmoid colon and sigmoid colon were mildly tortuous. Impression:               - Likely malignant polypoid lesion at the colonic                            anastomosis. Biopsied.                           - External and internal hemorrhoids.                           - Tortuous colon. Moderate Sedation:      Per Anesthesia Care Recommendation:           - Patient has a contact number available for                            emergencies. The signs and symptoms of potential                            delayed complications were discussed with the                             patient. Return to normal activities tomorrow.                            Written discharge instructions were provided to the                            patient.                           - High fiber diet.                           - Continue present medications.                           - Await pathology results.                           - Repeat colonoscopy in 1 year for surveillance.                           - Refer to a surgeon at the next available                            appointment.                           - Refer to an oncologist at the next available  appointment. Procedure Code(s):        --- Professional ---                           [phone], Colonoscopy, flexible; with biopsy, single                            or multiple Diagnosis Code(s):        --- Professional ---                           D49.0, Neoplasm of unspecified behavior of                            digestive system                           K64.8, Other hemorrhoids                           Z85.038, Personal history of other malignant                            neoplasm of large intestine                           Q43.8, Other specified congenital malformations of                            intestine CPT copyright 2019 American Medical Association. All rights reserved. The codes documented in this report are preliminary and upon coder review may  be revised to meet current compliance requirements. Barney Drain, MD Barney Drain MD, MD 06/01/2019 10:01:46 AM This report has been signed electronically. Number of Addenda: 0

## 2019-06-01 NOTE — Transfer of Care (Signed)
Immediate Anesthesia Transfer of Care Note  Patient: Joel Jefferson.  Procedure(s) Performed: COLONOSCOPY WITH PROPOFOL (N/A ) ESOPHAGOGASTRODUODENOSCOPY (EGD) WITH PROPOFOL BIOPSY  Patient Location: PACU  Anesthesia Type:General  Level of Consciousness: awake, alert  and oriented  Airway & Oxygen Therapy: Patient Spontanous Breathing  Post-op Assessment: Report given to RN, Post -op Vital signs reviewed and stable and Patient moving all extremities X 4  Post vital signs: Reviewed and stable  Last Vitals:  Vitals Value Taken Time  BP    Temp    Pulse 70 06/01/19 1003  Resp 12 06/01/19 1003  SpO2 100 % 06/01/19 1003  Vitals shown include unvalidated device data.  Last Pain:  Vitals:   06/01/19 0918  TempSrc:   PainSc: 0-No pain         Complications: No apparent anesthesia complications

## 2019-06-01 NOTE — Anesthesia Preprocedure Evaluation (Signed)
Anesthesia Evaluation  Patient identified by MRN, date of birth, ID band Patient awake    Reviewed: Allergy & Precautions, NPO status , Patient's Chart, lab work & pertinent test results  Airway Mallampati: I  TM Distance: >3 FB Neck ROM: Full    Dental no notable dental hx. (+) Poor Dentition, Missing   Pulmonary neg pulmonary ROS, former smoker,  H/o Covid - last inhalers over a month ago  Feels better   Pulmonary exam normal breath sounds clear to auscultation       Cardiovascular Exercise Tolerance: Good hypertension, negative cardio ROS Normal cardiovascular examI Rhythm:Regular Rate:Normal     Neuro/Psych negative neurological ROS  negative psych ROS   GI/Hepatic Neg liver ROS, Bowel prep,GERD  Medicated and Controlled,H/o Colon Ca - s/p CT last 10/2018 Here for surveillance  Reports lymph spread at the time of resection   Endo/Other  negative endocrine ROSdiabetes, Type 2, Oral Hypoglycemic Agents  Renal/GU negative Renal ROS  negative genitourinary   Musculoskeletal negative musculoskeletal ROS (+)   Abdominal   Peds negative pediatric ROS (+)  Hematology negative hematology ROS (+)   Anesthesia Other Findings   Reproductive/Obstetrics negative OB ROS                             Anesthesia Physical Anesthesia Plan  ASA: III  Anesthesia Plan: General   Post-op Pain Management:    Induction: Intravenous  PONV Risk Score and Plan: 2 and Propofol infusion and TIVA  Airway Management Planned: Nasal Cannula and Simple Face Mask  Additional Equipment:   Intra-op Plan:   Post-operative Plan:   Informed Consent: I have reviewed the patients History and Physical, chart, labs and discussed the procedure including the risks, benefits and alternatives for the proposed anesthesia with the patient or authorized representative who has indicated his/her understanding and  acceptance.     Dental advisory given  Plan Discussed with: CRNA  Anesthesia Plan Comments: (Plan Full PPE use  Plan GA with GETA as needed d/w pt -WTP with same after Q&A)        Anesthesia Quick Evaluation

## 2019-06-01 NOTE — Discharge Instructions (Signed)
THE ANASTOMOSIS LOOKS ABNORMAL .I BIOPSIED THE ANASTOMOSIS. You have moderate size internal hemorrhoids. YOU DID NOT HAVE ANY POLYPS. THE UPPER ENDOSCOPY IS NORMAL.   DRINK WATER TO KEEP YOUR URINE LIGHT YELLOW.  FOLLOW A HIGH FIBER DIET. AVOID ITEMS THAT CAUSE BLOATING. SEE INFO BELOW.  USE PREPARATION H FOUR TIMES  A DAY IF NEEDED TO RELIEVE RECTAL PAIN/PRESSURE/BLEEDING.   YOUR BIOPSY RESULTS WILL BE BACK IN 5 BUSINESS DAYS. I WILL TALK WITH DR. KATRAGADDA. YOU MAY NEED MORE SURGERY IF THE COLON CANCER HAS RETURNED.  Next colonoscopy in 1 year.  Colonoscopy Care After Read the instructions outlined below and refer to this sheet in the next week. These discharge instructions provide you with general information on caring for yourself after you leave the hospital. While your treatment has been planned according to the most current medical practices available, unavoidable complications occasionally occur. If you have any problems or questions after discharge, call DR. Jamier Urbas, [phone].  ACTIVITY  You may resume your regular activity, but move at a slower pace for the next 24 hours.   Take frequent rest periods for the next 24 hours.   Walking will help get rid of the air and reduce the bloated feeling in your belly (abdomen).   No driving for 24 hours (because of the medicine (anesthesia) used during the test).   You may shower.   Do not sign any important legal documents or operate any machinery for 24 hours (because of the anesthesia used during the test).    NUTRITION  Drink plenty of fluids.   You may resume your normal diet as instructed by your doctor.   Begin with a light meal and progress to your normal diet. Heavy or fried foods are harder to digest and may make you feel sick to your stomach (nauseated).   Avoid alcoholic beverages for 24 hours or as instructed.    MEDICATIONS  You may resume your normal medications.   WHAT YOU CAN EXPECT TODAY  Some  feelings of bloating in the abdomen.   Passage of more gas than usual.   Spotting of blood in your stool or on the toilet paper  .  IF YOU HAD POLYPS REMOVED DURING THE COLONOSCOPY:  Eat a soft diet IF YOU HAVE NAUSEA, BLOATING, ABDOMINAL PAIN, OR VOMITING.    FINDING OUT THE RESULTS OF YOUR TEST Not all test results are available during your visit. DR. Oneida Alar WILL CALL YOU WITHIN 14 DAYS OF YOUR PROCEDUE WITH YOUR RESULTS. Do not assume everything is normal if you have not heard from DR. Lariah Fleer, CALL HER OFFICE AT [phone].  SEEK IMMEDIATE MEDICAL ATTENTION AND CALL THE OFFICE: [phone] IF:  You have more than a spotting of blood in your stool.   Your belly is swollen (abdominal distention).   You are nauseated or vomiting.   You have a temperature over 101F.   You have abdominal pain or discomfort that is severe or gets worse throughout the day.  High-Fiber Diet A high-fiber diet changes your normal diet to include more whole grains, legumes, fruits, and vegetables. Changes in the diet involve replacing refined carbohydrates with unrefined foods. The calorie level of the diet is essentially unchanged. The Dietary Reference Intake (recommended amount) for adult males is 38 grams per day. For adult females, it is 25 grams per day. Pregnant and lactating women should consume 28 grams of fiber per day. Fiber is the intact part of a plant that is not broken down during  digestion. Functional fiber is fiber that has been isolated from the plant to provide a beneficial effect in the body. PURPOSE  Increase stool bulk.   Ease and regulate bowel movements.   Lower cholesterol.   REDUCE RISK OF COLON CANCER  INDICATIONS THAT YOU NEED MORE FIBER  Constipation and hemorrhoids.   Uncomplicated diverticulosis (intestine condition) and irritable bowel syndrome.   Weight management.   As a protective measure against hardening of the arteries (atherosclerosis), diabetes, and  cancer.   GUIDELINES FOR INCREASING FIBER IN THE DIET  Start adding fiber to the diet slowly. A gradual increase of about 5 more grams (2 slices of whole-wheat bread, 2 servings of most fruits or vegetables, or 1 bowl of high-fiber cereal) per day is best. Too rapid an increase in fiber may result in constipation, flatulence, and bloating.   Drink enough water and fluids to keep your urine clear or pale yellow. Water, juice, or caffeine-free drinks are recommended. Not drinking enough fluid may cause constipation.   Eat a variety of high-fiber foods rather than one type of fiber.   Try to increase your intake of fiber through using high-fiber foods rather than fiber pills or supplements that contain small amounts of fiber.   The goal is to change the types of food eaten. Do not supplement your present diet with high-fiber foods, but replace foods in your present diet.    INCLUDE A VARIETY OF FIBER SOURCES  Replace refined and processed grains with whole grains, canned fruits with fresh fruits, and incorporate other fiber sources. White rice, white breads, and most bakery goods contain little or no fiber.   Brown whole-grain rice, buckwheat oats, and many fruits and vegetables are all good sources of fiber. These include: broccoli, Brussels sprouts, cabbage, cauliflower, beets, sweet potatoes, white potatoes (skin on), carrots, tomatoes, eggplant, squash, berries, fresh fruits, and dried fruits.   Cereals appear to be the richest source of fiber. Cereal fiber is found in whole grains and bran. Bran is the fiber-rich outer coat of cereal grain, which is largely removed in refining. In whole-grain cereals, the bran remains. In breakfast cereals, the largest amount of fiber is found in those with "bran" in their names. The fiber content is sometimes indicated on the label.   You may need to include additional fruits and vegetables each day.   In baking, for 1 cup white flour, you may use the  following substitutions:   1 cup whole-wheat flour minus 2 tablespoons.   1/2 cup white flour plus 1/2 cup whole-wheat flour.   Hemorrhoids Hemorrhoids are dilated (enlarged) veins around the rectum. Sometimes clots will form in the veins. This makes them swollen and painful. These are called thrombosed hemorrhoids. Causes of hemorrhoids include:  Constipation.   Straining to have a bowel movement.   HEAVY LIFTING  HOME CARE INSTRUCTIONS  Eat a well balanced diet and drink 6 to 8 glasses of water every day to avoid constipation. You may also use a bulk laxative.   Avoid straining to have bowel movements.   Keep anal area dry and clean.   Do not use a donut shaped pillow or sit on the toilet for long periods. This increases blood pooling and pain.   Move your bowels when your body has the urge; this will require less straining and will decrease pain and pressure.

## 2019-06-01 NOTE — Anesthesia Postprocedure Evaluation (Signed)
Anesthesia Post Note  Patient: Daegen Beachler.  Procedure(s) Performed: COLONOSCOPY WITH PROPOFOL (N/A ) ESOPHAGOGASTRODUODENOSCOPY (EGD) WITH PROPOFOL BIOPSY  Patient location during evaluation: PACU Anesthesia Type: General Level of consciousness: awake and alert and patient cooperative Pain management: satisfactory to patient Respiratory status: spontaneous breathing Cardiovascular status: stable Postop Assessment: no apparent nausea or vomiting Anesthetic complications: no     Last Vitals:  Vitals:   06/01/19 0723 06/01/19 1000  BP: 131/90 107/66  Pulse: 81 78  Resp: 17 16  Temp: 36.9 C 36.6 C  SpO2: 98% 100%    Last Pain:  Vitals:   06/01/19 1000  TempSrc:   PainSc: 0-No pain                 Leshaun Biebel

## 2019-06-02 ENCOUNTER — Telehealth: Payer: Self-pay

## 2019-06-02 LAB — SURGICAL PATHOLOGY

## 2019-06-02 NOTE — Telephone Encounter (Signed)
Noted  

## 2019-06-02 NOTE — Telephone Encounter (Signed)
Pt saw results in MY Chart and has questions.  I told him Dr. Oneida Alar has not sent me a message yet, but she will be calling him.

## 2019-06-02 NOTE — Telephone Encounter (Addendum)
Called patient TO DISCUSS results.WILL CALL DR. Arnoldo Morale AND DR. Raliegh Ip. PT HAS POSITIVE OUTLOOK.I BROUGHT UP DISCUSSION ABOUT GOING TO [facility] FOR RESEARCH PROTOCOL. PT WILL DISCUSS WITH DR. Raliegh Ip.  1524: RECOMMENDED ADDITIONAL IMAGING. CONSIDER  SUBTOTAL RESECTION IF NO DISEASE ELSEWHERE. WOULD LIKE TCS SENT TO HIM.

## 2019-06-03 ENCOUNTER — Other Ambulatory Visit (HOSPITAL_COMMUNITY): Payer: Self-pay | Admitting: *Deleted

## 2019-06-03 DIAGNOSIS — C187 Malignant neoplasm of sigmoid colon: Secondary | ICD-10-CM

## 2019-06-03 NOTE — Telephone Encounter (Signed)
CC'ED TCS AND PATH TO DR Arnoldo Morale

## 2019-06-21 ENCOUNTER — Ambulatory Visit (HOSPITAL_COMMUNITY): Payer: 59

## 2019-06-23 ENCOUNTER — Ambulatory Visit (HOSPITAL_COMMUNITY): Payer: PRIVATE HEALTH INSURANCE | Admitting: Hematology

## 2019-06-23 ENCOUNTER — Other Ambulatory Visit (HOSPITAL_COMMUNITY): Payer: Self-pay | Admitting: Nurse Practitioner

## 2019-07-05 ENCOUNTER — Encounter (HOSPITAL_COMMUNITY)
Admission: RE | Admit: 2019-07-05 | Discharge: 2019-07-05 | Disposition: A | Discharge status: Home / Self Care | Payer: 59 | Source: Ambulatory Visit | Attending: Hematology | Admitting: Hematology

## 2019-07-05 ENCOUNTER — Other Ambulatory Visit: Payer: Self-pay

## 2019-07-05 DIAGNOSIS — C187 Malignant neoplasm of sigmoid colon: Secondary | ICD-10-CM | POA: Insufficient documentation

## 2019-07-05 MED ORDER — FLUDEOXYGLUCOSE F - 18 (FDG) INJECTION
13.3900 | Freq: Once | INTRAVENOUS | Status: AC | PRN
  Administered 2019-07-05: 13.39 via INTRAVENOUS

## 2019-07-07 ENCOUNTER — Inpatient Hospital Stay (HOSPITAL_COMMUNITY): Payer: 59 | Attending: Hematology | Admitting: Hematology

## 2019-07-07 DIAGNOSIS — R918 Other nonspecific abnormal finding of lung field: Secondary | ICD-10-CM | POA: Diagnosis not present

## 2019-07-07 DIAGNOSIS — C187 Malignant neoplasm of sigmoid colon: Secondary | ICD-10-CM | POA: Insufficient documentation

## 2019-07-07 DIAGNOSIS — R945 Abnormal results of liver function studies: Secondary | ICD-10-CM | POA: Insufficient documentation

## 2019-07-07 NOTE — Progress Notes (Signed)
 [facility] [address] [place], [place] [address]   CLINIC:  Medical Oncology/Hematology  PCP:  [facility], John, MD [address] [place] [address] [phone]   REASON FOR VISIT:  Follow-up for stage III colon cancer  CURRENT THERAPY:Surveillance post adjuvant chemotherapy.   BRIEF ONCOLOGIC HISTORY:  Oncology History  Malignant neoplasm of sigmoid colon (HCC)   Initial Diagnosis   Cancer of sigmoid colon (HCC)   03/27/2018 Genetic Testing   ALK c.350C>G VUS identified on the multicancer panel.  The Multi-Gene Panel offered by [facility] includes sequencing and/or deletion duplication testing of the following 85 genes: AIP, ALK, APC, ATM, AXIN2,BAP1,  BARD1, BLM, BMPR1A, BRCA1, BRCA2, BRIP1, CASR, CDC73, CDH1, CDK4, CDKN1B, CDKN1C, CDKN2A (p14ARF), CDKN2A (p16INK4a), CEBPA, CHEK2, CTNNA1, DICER1, DIS3L2, EGFR (c.2369C>T, p.Thr790Met variant only), EPCAM (Deletion/duplication testing only), FH, FLCN, GATA2, GPC3, GREM1 (Promoter region deletion/duplication testing only), HOXB13 (c.251G>A, p.Gly84Glu), HRAS, KIT, MAX, MEN1, MET, MITF (c.952G>A, p.Glu318Lys variant only), MLH1, MSH2, MSH3, MSH6, MUTYH, NBN, NF1, NF2, NTHL1, PALB2, PDGFRA, PHOX2B, PMS2, POLD1, POLE, POT1, PRKAR1A, PTCH1, PTEN, RAD50, RAD51C, RAD51D, RB1, RECQL4, RET, RNF43, RUNX1, SDHAF2, SDHA (sequence changes only), SDHB, SDHC, SDHD, SMAD4, SMARCA4, SMARCB1, SMARCE1, STK11, SUFU, TERC, TERT, TMEM127, TP53, TSC1, TSC2, VHL, WRN and WT1.   The report date is 03/27/2018.   04/21/2018 -  Chemotherapy   The patient had palonosetron (ALOXI) injection 0.25 mg, 0.25 mg, Intravenous,  Once, 13 of 13 cycles Administration: 0.25 mg (04/21/2018), 0.25 mg (05/05/2018), 0.25 mg (05/19/2018), 0.25 mg (06/02/2018), 0.25 mg (06/17/2018), 0.25 mg (06/30/2018), 0.25 mg (07/13/2018), 0.25 mg (08/11/2018), 0.25 mg (08/25/2018), 0.25 mg (09/08/2018), 0.25 mg (09/22/2018), 0.25 mg (10/06/2018) leucovorin 800 mg in dextrose 5 % 250 mL  infusion, 876 mg, Intravenous,  Once, 13 of 13 cycles Administration: 800 mg (04/21/2018), 900 mg (05/05/2018), 800 mg (05/19/2018), 800 mg (06/02/2018), 900 mg (06/17/2018), 900 mg (06/30/2018), 900 mg (07/13/2018), 900 mg (08/11/2018), 900 mg (08/25/2018), 876 mg (09/22/2018), 900 mg (09/08/2018), 876 mg (10/06/2018) oxaliplatin (ELOXATIN) 185 mg in dextrose 5 % 500 mL chemo infusion, 85 mg/m2 = 185 mg, Intravenous,  Once, 13 of 13 cycles Dose modification: 68 mg/m2 (80 % of original dose 85 mg/m2, Cycle 12, Reason: Other (see comments), Comment: neuropathy), 42.5 mg/m2 (50 % of original dose 85 mg/m2, Cycle 13, Reason: Provider Judgment) Administration: 185 mg (04/21/2018), 185 mg (05/05/2018), 185 mg (05/19/2018), 185 mg (06/02/2018), 185 mg (06/17/2018), 185 mg (06/30/2018), 185 mg (07/13/2018), 185 mg (08/11/2018), 185 mg (08/25/2018), 185 mg (09/08/2018), 150 mg (09/22/2018), 95 mg (10/06/2018) fluorouracil (ADRUCIL) chemo injection 900 mg, 400 mg/m2 = 900 mg, Intravenous,  Once, 13 of 13 cycles Administration: 900 mg (04/21/2018), 900 mg (05/05/2018), 900 mg (05/19/2018), 900 mg (06/02/2018), 900 mg (06/17/2018), 900 mg (06/30/2018), 900 mg (07/13/2018), 900 mg (08/11/2018), 900 mg (08/25/2018), 900 mg (09/08/2018), 900 mg (09/22/2018), 900 mg (10/06/2018) fosaprepitant (EMEND) 150 mg, dexamethasone (DECADRON) 12 mg in sodium chloride 0.9 % 145 mL IVPB, , Intravenous,  Once, 10 of 10 cycles Administration:  (06/02/2018),  (06/17/2018),  (06/30/2018),  (07/13/2018),  (08/11/2018),  (08/25/2018),  (09/08/2018),  (09/22/2018),  (10/06/2018) fluorouracil (ADRUCIL) 5,250 mg in sodium chloride 0.9 % 145 mL chemo infusion, 2,400 mg/m2 = 5,250 mg, Intravenous, 1 Day/Dose, 13 of 13 cycles Administration: 5,250 mg (04/21/2018), 5,250 mg (05/05/2018), 5,250 mg (05/19/2018), 5,250 mg (06/02/2018), 5,250 mg (06/17/2018), 5,250 mg (06/30/2018), 5,250 mg (07/13/2018), 5,250 mg (08/11/2018), 5,250 mg (08/25/2018), 5,250 mg (09/08/2018), 5,250 mg (09/22/2018), 5,250 mg (10/06/2018)     for chemotherapy treatment.       CANCER STAGING: Cancer Staging No matching staging information was found for the patient.   INTERVAL HISTORY:  [name] 44 y.o. male seen for follow-up of for colon cancer.  He had a repeat colonoscopy which showed recurrence of tumor at the anastomosis.  Appetite and energy levels are 75%.  Reports some constipation.  Sees some blood in the stools occasionally.  Denies any new onset pains.  Chronic neuropathy stable.    REVIEW OF SYSTEMS:  Review of Systems  Gastrointestinal: Positive for constipation.  Neurological: Positive for numbness.  All other systems reviewed and are negative.    PAST MEDICAL/SURGICAL HISTORY:  Past Medical History:  Diagnosis Date  . Diabetes mellitus without complication ([facility])   . Family history of breast cancer   . GERD (gastroesophageal reflux disease)   . History of kidney stones   . Hypercholesterolemia   . Hypertension   . Urethral stricture    Past Surgical History:  Procedure Laterality Date  . BALLOON DILATION  04/06/2012   Procedure: BALLOON DILATION;  Surgeon: Reece Packer, MD;  Location: [facility];  Service: Urology;  Laterality: N/A;  . BIOPSY  02/24/2018   Procedure: BIOPSY;  Surgeon: Danie Binder, MD;  Location: [facility];  Service: Endoscopy;;  colon  . BIOPSY  06/01/2019   Procedure: BIOPSY;  Surgeon: Danie Binder, MD;  Location: [facility];  Service: Endoscopy;;  . COLONOSCOPY WITH PROPOFOL N/A 02/24/2018   Dr. Oneida Alar: fungating, infiltrative partially obstructing mass in sigmoid colon, area tattooed. ext/int hemorrhoids. Bx: adenocarcinoma  . COLONOSCOPY WITH PROPOFOL N/A 06/01/2019   Procedure: COLONOSCOPY WITH PROPOFOL;  Surgeon: Danie Binder, MD;  Location: [facility];  Service: Endoscopy;  Laterality: N/A;  9:30am  . ESOPHAGOGASTRODUODENOSCOPY (EGD) WITH PROPOFOL N/A 08/29/2015   Dr. Oneida Alar: possible proximal esophageal web s/p dilation, moderate  gastritis, negative eosinophilic esophagitis   . ESOPHAGOGASTRODUODENOSCOPY (EGD) WITH PROPOFOL  06/01/2019   Procedure: ESOPHAGOGASTRODUODENOSCOPY (EGD) WITH PROPOFOL;  Surgeon: Danie Binder, MD;  Location: [facility];  Service: Endoscopy;;  . PARTIAL COLECTOMY N/A 03/02/2018   Procedure: PARTIAL COLECTOMY;  Surgeon: Joel Signs, MD;  Location: [facility];  Service: General;  Laterality: N/A;  . PORTACATH PLACEMENT Left 04/13/2018   Procedure: INSERTION PORT-A-CATH;  Surgeon: Joel Signs, MD;  Location: [facility];  Service: General;  Laterality: Left;  . SAVORY DILATION N/A 08/29/2015   Procedure: SAVORY DILATION;  Surgeon: Danie Binder, MD;  Location: [facility];  Service: Endoscopy;  Laterality: N/A;  . URETHRAL DILATION    . WISDOM TOOTH EXTRACTION       SOCIAL HISTORY:  Social History   Socioeconomic History  . Marital status: Divorced    Spouse name: Not on file  . Number of children: Not on file  . Years of education: Not on file  . Highest education level: Not on file  Occupational History  . Not on file  Tobacco Use  . Smoking status: Former Smoker    Packs/day: 0.25    Years: 9.00    Pack years: 2.25    Types: Cigarettes    Quit date: 07/27/2011    Years since quitting: 7.9  . Smokeless tobacco: Never Used  Substance and Sexual Activity  . Alcohol use: Yes    Alcohol/week: 6.0 standard drinks    Types: 6 Cans of beer per week  . Drug use: No  . Sexual activity: Yes  Birth control/protection: None  Other Topics Concern  . Not on file  Social History Narrative  . Not on file   Social Determinants of Health   Financial Resource Strain:   . Difficulty of Paying Living Expenses:   Food Insecurity:   . Worried About Charity fundraiser in the Last Year:   . Arboriculturist in the Last Year:   Transportation Needs:   . Film/video editor (Medical):   Marland Kitchen Lack of Transportation (Non-Medical):   Physical Activity:   . Days of Exercise per Week:   .  Minutes of Exercise per Session:   Stress:   . Feeling of Stress :   Social Connections:   . Frequency of Communication with Friends and Family:   . Frequency of Social Gatherings with Friends and Family:   . Attends Religious Services:   . Active Member of Clubs or Organizations:   . Attends Archivist Meetings:   Marland Kitchen Marital Status:   Intimate Partner Violence:   . Fear of Current or Ex-Partner:   . Emotionally Abused:   Marland Kitchen Physically Abused:   . Sexually Abused:     FAMILY HISTORY:  Family History  Problem Relation Age of Onset  . Dementia Mother   . Dementia Maternal Aunt   . Dementia Maternal Grandmother   . COPD Maternal Grandfather   . Breast cancer Maternal Aunt 42  . Breast cancer Maternal Aunt        dx in her 19s  . Lung cancer Maternal Aunt   . Healthy Son   . Colon cancer Neg Hx     CURRENT MEDICATIONS:  Outpatient Encounter Medications as of 07/07/2019  Medication Sig  . glipiZIDE (GLUCOTROL XL) 5 MG 24 hr tablet TAKE 1 TABLET(5 MG) BY MOUTH DAILY WITH BREAKFAST (Patient taking differently: Take 5 mg by mouth daily with breakfast. )  . losartan (COZAAR) 100 MG tablet Take 100 mg by mouth daily.  . magnesium oxide (MAG-OX) 400 MG tablet Take 400 mg by mouth daily.  . Multiple Vitamin (MULTIVITAMIN) tablet Take 1 tablet by mouth daily.  Marland Kitchen omeprazole (PRILOSEC) 40 MG capsule Take 40 mg by mouth daily.  . Potassium 99 MG TABS Take 99 mg by mouth daily.  . pravastatin (PRAVACHOL) 20 MG tablet Take 20 mg by mouth daily.   Marland Kitchen topiramate (TOPAMAX) 100 MG tablet Take 100 mg by mouth daily.   Marland Kitchen albuterol (VENTOLIN HFA) 108 (90 Base) MCG/ACT inhaler Inhale 2 puffs into the lungs every 6 (six) hours as needed for wheezing or shortness of breath.   . benzonatate (TESSALON) 200 MG capsule Take 200 mg by mouth 2 (two) times daily as needed for cough.   . CONTOUR NEXT TEST test strip USE TO CHECK BLOOD SUGAR BID  . [DISCONTINUED] glipiZIDE (GLUCOTROL XL) 5 MG 24 hr  tablet TAKE 1 TABLET(5 MG) BY MOUTH DAILY WITH BREAKFAST   Facility-Administered Encounter Medications as of 07/07/2019  Medication  . sodium chloride flush (NS) 0.9 % injection 10 mL    ALLERGIES:  No Known Allergies   PHYSICAL EXAM:  ECOG Performance status: 1  Vitals:   07/07/19 1009  BP: (!) 143/96  Pulse: 98  Resp: 16  Temp: (!) 96.4 F (35.8 C)  SpO2: 98%   Filed Weights   07/07/19 1009  Weight: 247 lb 1.6 oz (112.1 kg)    Physical Exam Vitals reviewed.  Constitutional:      Appearance: Normal appearance.  Cardiovascular:  Rate and Rhythm: Normal rate and regular rhythm.     Heart sounds: Normal heart sounds.  Pulmonary:     Effort: Pulmonary effort is normal.     Breath sounds: Normal breath sounds.  Abdominal:     General: There is no distension.     Palpations: Abdomen is soft. There is no mass.  Musculoskeletal:        General: No swelling.  Skin:    General: Skin is warm.  Neurological:     General: No focal deficit present.     Mental Status: He is alert and oriented to person, place, and time.  Psychiatric:        Mood and Affect: Mood normal.        Behavior: Behavior normal.      LABORATORY DATA:  I have reviewed the labs as listed.  CBC    Component Value Date/Time   WBC 4.6 05/10/2019 0908   RBC 4.94 05/10/2019 0908   HGB 14.6 05/10/2019 0908   HCT 43.4 05/10/2019 0908   PLT 130 (L) 05/10/2019 0908   MCV 87.9 05/10/2019 0908   MCH 29.6 05/10/2019 0908   MCHC 33.6 05/10/2019 0908   RDW 13.6 05/10/2019 0908   LYMPHSABS 2.0 05/10/2019 0908   MONOABS 0.4 05/10/2019 0908   EOSABS 0.3 05/10/2019 0908   BASOSABS 0.0 05/10/2019 0908   CMP Latest Ref Rng & Units 05/10/2019 01/27/2019 10/23/2018  Glucose 70 - 99 mg/dL 141(H) 97 144(H)  BUN 6 - 20 mg/dL '19 18 18  '$ Creatinine 0.61 - 1.24 mg/dL 0.93 0.87 0.78  Sodium 135 - 145 mmol/L 141 137 138  Potassium 3.5 - 5.1 mmol/L 3.8 3.7 4.3  Chloride 98 - 111 mmol/L 107 107 108  CO2 22 - 32  mmol/L '26 22 23  '$ Calcium 8.9 - 10.3 mg/dL 9.0 8.8(L) 9.0  Total Protein 6.5 - 8.1 g/dL 6.8 6.9 6.6  Total Bilirubin 0.3 - 1.2 mg/dL 0.7 0.6 0.8  Alkaline Phos 38 - 126 U/L 45 44 52  AST 15 - 41 U/L 61(H) 28 35  ALT 0 - 44 U/L 137(H) 54(H) 56(H)       DIAGNOSTIC IMAGING:  I have reviewed the scans.      ASSESSMENT & PLAN:   Malignant neoplasm of sigmoid colon ([facility]) 1.  Stage III (PT3PN2B) sigmoid colon adenocarcinoma, MSI-stable: - Status post sigmoid colon segmental resection on 03/02/2018 by Dr. Arnoldo Morale, 14/35 lymph nodes positive. - PET CT scan on 03/30/2018 showed bilateral hypermetabolic pelvic sidewall lymph nodes.  There are subcentimeter not in the initial distribution pattern for sigmoid primary.  Lymph nodes are in the bilateral external iliac and right inguinal lymph node. -12 cycles of FOLFOX from 04/21/2018 through 10/06/2018. -Repeat colonoscopy on 06/01/2019 showed recurrence at the anastomotic area.  Biopsy was consistent with adenocarcinoma. -We reviewed results of PET scan from 07/05/2019.  Increased uptake in the anastomotic area.  There are 2 small 5 mm nodules in the right upper lobe and left upper lobe which are new.  These are not hypermetabolic on PET scan given the small size.  Patient had COVID-19 infection earlier this year.  I do not know if these are sequela of that infection. -We will follow up on these nodules down the line. -I have recommended surgical resection.  We have made a referral to Dr. Arnoldo Morale.  I will see him back 4 weeks after the surgery.   2.  Peripheral neuropathy: -He has some numbness in the fingertips  and toes which is continuing to improve.  No medications required.  3.  Elevated LFTs: -Most recent blood work showed mild elevation of AST and ALT.  Likely from fatty liver noted on the most recent CT scan.      Orders placed this encounter:  No orders of the defined types were placed in this encounter.     Derek Jack,  MD [facility] [phone]

## 2019-07-07 NOTE — Assessment & Plan Note (Signed)
1.  Stage III (PT3PN2B) sigmoid colon adenocarcinoma, MSI-stable: - Status post sigmoid colon segmental resection on 03/02/2018 by Dr. Arnoldo Morale, 14/35 lymph nodes positive. - PET CT scan on 03/30/2018 showed bilateral hypermetabolic pelvic sidewall lymph nodes.  There are subcentimeter not in the initial distribution pattern for sigmoid primary.  Lymph nodes are in the bilateral external iliac and right inguinal lymph node. -12 cycles of FOLFOX from 04/21/2018 through 10/06/2018. -Repeat colonoscopy on 06/01/2019 showed recurrence at the anastomotic area.  Biopsy was consistent with adenocarcinoma. -We reviewed results of PET scan from 07/05/2019.  Increased uptake in the anastomotic area.  There are 2 small 5 mm nodules in the right upper lobe and left upper lobe which are new.  These are not hypermetabolic on PET scan given the small size.  Patient had COVID-19 infection earlier this year.  I do not know if these are sequela of that infection. -We will follow up on these nodules down the line. -I have recommended surgical resection.  We have made a referral to Dr. Arnoldo Morale.  I will see him back 4 weeks after the surgery.   2.  Peripheral neuropathy: -He has some numbness in the fingertips and toes which is continuing to improve.  No medications required.  3.  Elevated LFTs: -Most recent blood work showed mild elevation of AST and ALT.  Likely from fatty liver noted on the most recent CT scan.

## 2019-07-07 NOTE — Patient Instructions (Addendum)
[place] at [facility] Discharge Instructions  You were seen today by Dr. Delton Coombes. He went over your recent PET results. He will refer you back to Dr. Arnoldo Morale for surgery. He will see you back in a month after your surgery.   Thank you for choosing [place] at [facility] to provide your oncology and hematology care.  To afford each patient quality time with our provider, please arrive at least 15 minutes before your scheduled appointment time.   If you have a lab appointment with the [facility] please come in thru the  Main Entrance and check in at the main information desk  You need to re-schedule your appointment should you arrive 10 or more minutes late.  We strive to give you quality time with our providers, and arriving late affects you and other patients whose appointments are after yours.  Also, if you no show three or more times for appointments you may be dismissed from the clinic at the providers discretion.     Again, thank you for choosing [facility].  Our hope is that these requests will decrease the amount of time that you wait before being seen by our physicians.       _____________________________________________________________  Should you have questions after your visit to [facility], please contact our office at [phone] between the hours of 8:00 a.m. and 4:30 p.m.  Voicemails left after 4:00 p.m. will not be returned until the following business day.  For prescription refill requests, have your pharmacy contact our office and allow 72 hours.    Cancer Center Support Programs:   > Cancer Support Group  2nd Tuesday of the month 1pm-2pm, Journey Room

## 2019-07-22 ENCOUNTER — Telehealth: Payer: Self-pay

## 2019-07-22 ENCOUNTER — Encounter: Payer: Self-pay | Admitting: General Surgery

## 2019-07-22 ENCOUNTER — Ambulatory Visit (INDEPENDENT_AMBULATORY_CARE_PROVIDER_SITE_OTHER): Payer: No Typology Code available for payment source | Admitting: General Surgery

## 2019-07-22 ENCOUNTER — Other Ambulatory Visit: Payer: Self-pay

## 2019-07-22 VITALS — BP 127/87 | HR 94 | Temp 98.0°F | Resp 12 | Ht 70.0 in | Wt 242.0 lb

## 2019-07-22 DIAGNOSIS — C186 Malignant neoplasm of descending colon: Secondary | ICD-10-CM | POA: Diagnosis not present

## 2019-07-22 MED ORDER — NEOMYCIN SULFATE 500 MG PO TABS: 1000.0000 mg | ORAL_TABLET | Freq: Three times a day (TID) | ORAL | 0 refills | Status: DC

## 2019-07-22 MED ORDER — GOLYTELY 236 G PO SOLR: 4000.0000 mL | Freq: Once | ORAL | 0 refills | Status: DC

## 2019-07-22 MED ORDER — METRONIDAZOLE 500 MG PO TABS: 500.0000 mg | ORAL_TABLET | Freq: Three times a day (TID) | ORAL | 0 refills | Status: DC

## 2019-07-22 MED ORDER — SUPREP BOWEL PREP KIT 17.5-3.13-1.6 GM/177ML PO SOLN: 1.0000 | Freq: Once | ORAL | 0 refills | Status: AC

## 2019-07-22 NOTE — Patient Instructions (Signed)
Clear liquids the day before surgery.  Start drinking bowel prep at noon the day before surgery until bowel movements clear.  Take antibiotics the day before surgery as prescribed.

## 2019-07-22 NOTE — Telephone Encounter (Signed)
Disability paperwork completed and faxed to [facility] [phone]. Begin date 07/26/19. Placed in scan bin.

## 2019-07-23 MED ORDER — LACTATED RINGERS IV SOLN: INTRAVENOUS | Status: DC

## 2019-07-23 NOTE — H&P (Signed)
Joel Jefferson.; [id number]; [birth year]   HPI Patient is a 44 year old white male who was referred back to my care by Dr. Delton Coombes of oncology and Dr. Hilma Favors for evaluation and treatment of recurrent colon cancer.  Patient is status post a sigmoid colectomy in 2019 for a T3N2M0 adenocarcinoma of the colon.  He underwent chemotherapy after the surgery.  He had a follow-up colonoscopy recently which showed recurrence of the cancer at the anastomosis.  Recent PET scan showed uptake at the anastomosis, with multiple lung nodules that did not meet the criteria for metastatic disease.  Patient currently has 0 out of 10 abdominal pain. Past Medical History:  Diagnosis Date  . Diabetes mellitus without complication ([place])   . Family history of breast cancer   . GERD (gastroesophageal reflux disease)   . History of kidney stones   . Hypercholesterolemia   . Hypertension   . Urethral stricture     Past Surgical History:  Procedure Laterality Date  . BALLOON DILATION  04/06/2012   Procedure: BALLOON DILATION;  Surgeon: Reece Packer, MD;  Location: [facility];  Service: Urology;  Laterality: N/A;  . BIOPSY  02/24/2018   Procedure: BIOPSY;  Surgeon: Danie Binder, MD;  [facility];  Service: Endoscopy;;  colon  . BIOPSY  06/01/2019   Procedure: BIOPSY;  Surgeon: Danie Binder, MD;  [facility];  Service: Endoscopy;;  . COLONOSCOPY WITH PROPOFOL N/A 02/24/2018   Dr. Oneida Alar: fungating, infiltrative partially obstructing mass in sigmoid colon, area tattooed. ext/int hemorrhoids. Bx: adenocarcinoma  . COLONOSCOPY WITH PROPOFOL N/A 06/01/2019   Procedure: COLONOSCOPY WITH PROPOFOL;  Surgeon: Danie Binder, MD;  [facility];  Service: Endoscopy;  Laterality: N/A;  9:30am  . ESOPHAGOGASTRODUODENOSCOPY (EGD) WITH PROPOFOL N/A 08/29/2015   Dr. Oneida Alar: possible proximal esophageal web s/p dilation, moderate gastritis, negative eosinophilic  esophagitis   . ESOPHAGOGASTRODUODENOSCOPY (EGD) WITH PROPOFOL  06/01/2019   Procedure: ESOPHAGOGASTRODUODENOSCOPY (EGD) WITH PROPOFOL;  Surgeon: Danie Binder, MD;  [facility];  Service: Endoscopy;;  . PARTIAL COLECTOMY N/A 03/02/2018   Procedure: PARTIAL COLECTOMY;  Surgeon: Aviva Signs, MD;  [facility];  Service: General;  Laterality: N/A;  . PORTACATH PLACEMENT Left 04/13/2018   Procedure: INSERTION PORT-A-CATH;  Surgeon: Aviva Signs, MD;  [facility];  Service: General;  Laterality: Left;  . SAVORY DILATION N/A 08/29/2015   Procedure: SAVORY DILATION;  Surgeon: Danie Binder, MD;  [facility];  Service: Endoscopy;  Laterality: N/A;  . URETHRAL DILATION    . WISDOM TOOTH EXTRACTION      Family History  Problem Relation Age of Onset  . Dementia Mother   . Dementia Maternal Aunt   . Dementia Maternal Grandmother   . COPD Maternal Grandfather   . Breast cancer Maternal Aunt 42  . Breast cancer Maternal Aunt        dx in her 44s  . Lung cancer Maternal Aunt   . Healthy Son   . Colon cancer Neg Hx     Current Outpatient Medications on File Prior to Visit  Medication Sig Dispense Refill  . albuterol (VENTOLIN HFA) 108 (90 Base) MCG/ACT inhaler Inhale 2 puffs into the lungs every 6 (six) hours as needed for wheezing or shortness of breath.     . CONTOUR NEXT TEST test strip USE TO CHECK BLOOD SUGAR BID    . glipiZIDE (GLUCOTROL XL) 5 MG 24 hr tablet TAKE  1 TABLET(5 MG) BY MOUTH DAILY WITH BREAKFAST (Patient taking differently: Take 5 mg by mouth daily with breakfast. ) 30 tablet 3  . losartan (COZAAR) 100 MG tablet Take 100 mg by mouth daily.    . magnesium oxide (MAG-OX) 400 MG tablet Take 400 mg by mouth daily.    . Multiple Vitamin (MULTIVITAMIN) tablet Take 1 tablet by mouth daily.    . pantoprazole (PROTONIX) 40 MG tablet Take 40 mg by mouth daily.    . Potassium 99 MG TABS Take 99 mg by mouth daily.    . pravastatin (PRAVACHOL) 20 MG  tablet Take 20 mg by mouth daily.   1  . topiramate (TOPAMAX) 100 MG tablet Take 100 mg by mouth daily.     . [DISCONTINUED] glipiZIDE (GLUCOTROL XL) 5 MG 24 hr tablet TAKE 1 TABLET(5 MG) BY MOUTH DAILY WITH BREAKFAST 30 tablet 3   Current Facility-Administered Medications on File Prior to Visit  Medication Dose Route Frequency Provider Last Rate Last Admin  . sodium chloride flush (NS) 0.9 % injection 10 mL  10 mL Intracatheter PRN Derek Jack, MD   10 mL at 09/10/18 1347    No Known Allergies  Social History   Substance and Sexual Activity  Alcohol Use Yes  . Alcohol/week: 6.0 standard drinks  . Types: 6 Cans of beer per week    Social History   Tobacco Use  Smoking Status Former Smoker  . Packs/day: 0.25  . Years: 9.00  . Pack years: 2.25  . Types: Cigarettes  . Quit date: 07/27/2011  . Years since quitting: 7.9  Smokeless Tobacco Never Used    Review of Systems  Constitutional: Negative.   HENT: Negative.   Eyes: Negative.   Respiratory: Negative.   Cardiovascular: Negative.   Gastrointestinal: Negative.   Genitourinary: Negative.   Musculoskeletal: Negative.   Skin: Negative.   Neurological: Negative.   Endo/Heme/Allergies: Negative.   Psychiatric/Behavioral: Negative.     Objective   Vitals:   07/22/19 0828  BP: 127/87  Pulse: 94  Resp: 12  Temp: 98 F (36.7 C)  SpO2: 97%    Physical Exam Vitals reviewed.  Constitutional:      Appearance: Normal appearance. He is not ill-appearing.  HENT:     Head: Normocephalic and atraumatic.  Cardiovascular:     Rate and Rhythm: Normal rate and regular rhythm.     Heart sounds: Normal heart sounds. No murmur. No friction rub. No gallop.   Pulmonary:     Effort: Pulmonary effort is normal. No respiratory distress.     Breath sounds: Normal breath sounds. No stridor. No wheezing, rhonchi or rales.  Abdominal:     General: Bowel sounds are normal. There is no distension.     Palpations: Abdomen is  soft. There is no mass.     Tenderness: There is no abdominal tenderness. There is no guarding or rebound.     Hernia: No hernia is present.  Skin:    General: Skin is warm and dry.  Neurological:     Mental Status: He is alert and oriented to person, place, and time.   Colonoscopy and pathology reports reviewed.  Oncology notes reviewed.  Operative note from 2019 reviewed.  Assessment  Recurrent colon carcinoma Plan   Patient is scheduled for low anterior resection on 07/28/2019.  The risks and benefits of the procedure including bleeding, infection, the possible need for blood transfusion, and the possible need for a colostomy were fully explained to  the patient, who gave informed consent.  Suprep, neomycin, and Flagyl have been ordered preoperatively.

## 2019-07-23 NOTE — Progress Notes (Signed)
Joel Jefferson.; [id number]; 18-Apr-1975   HPI Patient is a 44 year old white male who was referred back to my care by Dr. Delton Coombes of oncology and Dr. Hilma Favors for evaluation and treatment of recurrent colon cancer.  Patient is status post a sigmoid colectomy in 2019 for a T3N2M0 adenocarcinoma of the colon.  He underwent chemotherapy after the surgery.  He had a follow-up colonoscopy recently which showed recurrence of the cancer at the anastomosis.  Recent PET scan showed uptake at the anastomosis, with multiple lung nodules that did not meet the criteria for metastatic disease.  Patient currently has 0 out of 10 abdominal pain. Past Medical History:  Diagnosis Date  . Diabetes mellitus without complication ([place])   . Family history of breast cancer   . GERD (gastroesophageal reflux disease)   . History of kidney stones   . Hypercholesterolemia   . Hypertension   . Urethral stricture     Past Surgical History:  Procedure Laterality Date  . BALLOON DILATION  04/06/2012   Procedure: BALLOON DILATION;  Surgeon: Reece Packer, MD;  Location: [facility];  Service: Urology;  Laterality: N/A;  . BIOPSY  02/24/2018   Procedure: BIOPSY;  Surgeon: Danie Binder, MD;  [facility];  Service: Endoscopy;;  colon  . BIOPSY  06/01/2019   Procedure: BIOPSY;  Surgeon: Danie Binder, MD;  [facility];  Service: Endoscopy;;  . COLONOSCOPY WITH PROPOFOL N/A 02/24/2018   Dr. Oneida Alar: fungating, infiltrative partially obstructing mass in sigmoid colon, area tattooed. ext/int hemorrhoids. Bx: adenocarcinoma  . COLONOSCOPY WITH PROPOFOL N/A 06/01/2019   Procedure: COLONOSCOPY WITH PROPOFOL;  Surgeon: Danie Binder, MD;  [facility];  Service: Endoscopy;  Laterality: N/A;  9:30am  . ESOPHAGOGASTRODUODENOSCOPY (EGD) WITH PROPOFOL N/A 08/29/2015   Dr. Oneida Alar: possible proximal esophageal web s/p dilation, moderate gastritis, negative eosinophilic  esophagitis   . ESOPHAGOGASTRODUODENOSCOPY (EGD) WITH PROPOFOL  06/01/2019   Procedure: ESOPHAGOGASTRODUODENOSCOPY (EGD) WITH PROPOFOL;  Surgeon: Danie Binder, MD;  [facility];  Service: Endoscopy;;  . PARTIAL COLECTOMY N/A 03/02/2018   Procedure: PARTIAL COLECTOMY;  Surgeon: Aviva Signs, MD;  [facility];  Service: General;  Laterality: N/A;  . PORTACATH PLACEMENT Left 04/13/2018   Procedure: INSERTION PORT-A-CATH;  Surgeon: Aviva Signs, MD;  [facility];  Service: General;  Laterality: Left;  . SAVORY DILATION N/A 08/29/2015   Procedure: SAVORY DILATION;  Surgeon: Danie Binder, MD;  [facility];  Service: Endoscopy;  Laterality: N/A;  . URETHRAL DILATION    . WISDOM TOOTH EXTRACTION      Family History  Problem Relation Age of Onset  . Dementia Mother   . Dementia Maternal Aunt   . Dementia Maternal Grandmother   . COPD Maternal Grandfather   . Breast cancer Maternal Aunt 42  . Breast cancer Maternal Aunt        dx in her 45s  . Lung cancer Maternal Aunt   . Healthy Son   . Colon cancer Neg Hx     Current Outpatient Medications on File Prior to Visit  Medication Sig Dispense Refill  . albuterol (VENTOLIN HFA) 108 (90 Base) MCG/ACT inhaler Inhale 2 puffs into the lungs every 6 (six) hours as needed for wheezing or shortness of breath.     . CONTOUR NEXT TEST test strip USE TO CHECK BLOOD SUGAR BID    . glipiZIDE (GLUCOTROL XL) 5 MG 24 hr tablet TAKE  1 TABLET(5 MG) BY MOUTH DAILY WITH BREAKFAST (Patient taking differently: Take 5 mg by mouth daily with breakfast. ) 30 tablet 3  . losartan (COZAAR) 100 MG tablet Take 100 mg by mouth daily.    . magnesium oxide (MAG-OX) 400 MG tablet Take 400 mg by mouth daily.    . Multiple Vitamin (MULTIVITAMIN) tablet Take 1 tablet by mouth daily.    . pantoprazole (PROTONIX) 40 MG tablet Take 40 mg by mouth daily.    . Potassium 99 MG TABS Take 99 mg by mouth daily.    . pravastatin (PRAVACHOL) 20 MG  tablet Take 20 mg by mouth daily.   1  . topiramate (TOPAMAX) 100 MG tablet Take 100 mg by mouth daily.     . [DISCONTINUED] glipiZIDE (GLUCOTROL XL) 5 MG 24 hr tablet TAKE 1 TABLET(5 MG) BY MOUTH DAILY WITH BREAKFAST 30 tablet 3   Current Facility-Administered Medications on File Prior to Visit  Medication Dose Route Frequency Provider Last Rate Last Admin  . sodium chloride flush (NS) 0.9 % injection 10 mL  10 mL Intracatheter PRN Derek Jack, MD   10 mL at 09/10/18 1347    No Known Allergies  Social History   Substance and Sexual Activity  Alcohol Use Yes  . Alcohol/week: 6.0 standard drinks  . Types: 6 Cans of beer per week    Social History   Tobacco Use  Smoking Status Former Smoker  . Packs/day: 0.25  . Years: 9.00  . Pack years: 2.25  . Types: Cigarettes  . Quit date: 07/27/2011  . Years since quitting: 7.9  Smokeless Tobacco Never Used    Review of Systems  Constitutional: Negative.   HENT: Negative.   Eyes: Negative.   Respiratory: Negative.   Cardiovascular: Negative.   Gastrointestinal: Negative.   Genitourinary: Negative.   Musculoskeletal: Negative.   Skin: Negative.   Neurological: Negative.   Endo/Heme/Allergies: Negative.   Psychiatric/Behavioral: Negative.     Objective   Vitals:   07/22/19 0828  BP: 127/87  Pulse: 94  Resp: 12  Temp: 98 F (36.7 C)  SpO2: 97%    Physical Exam Vitals reviewed.  Constitutional:      Appearance: Normal appearance. He is not ill-appearing.  HENT:     Head: Normocephalic and atraumatic.  Cardiovascular:     Rate and Rhythm: Normal rate and regular rhythm.     Heart sounds: Normal heart sounds. No murmur. No friction rub. No gallop.   Pulmonary:     Effort: Pulmonary effort is normal. No respiratory distress.     Breath sounds: Normal breath sounds. No stridor. No wheezing, rhonchi or rales.  Abdominal:     General: Bowel sounds are normal. There is no distension.     Palpations: Abdomen is  soft. There is no mass.     Tenderness: There is no abdominal tenderness. There is no guarding or rebound.     Hernia: No hernia is present.  Skin:    General: Skin is warm and dry.  Neurological:     Mental Status: He is alert and oriented to person, place, and time.   Colonoscopy and pathology reports reviewed.  Oncology notes reviewed.  Operative note from 2019 reviewed.  Assessment  Recurrent colon carcinoma Plan   Patient is scheduled for low anterior resection on 07/28/2019.  The risks and benefits of the procedure including bleeding, infection, the possible need for blood transfusion, and the possible need for a colostomy were fully explained to  the patient, who gave informed consent.  Suprep, neomycin, and Flagyl have been ordered preoperatively.

## 2019-07-23 NOTE — Patient Instructions (Signed)
Joel Jefferson.  07/23/2019     @PREFPERIOPPHARMACY @   Your procedure is scheduled on   07/28/2019   Report to [facility] at  [place].M.  Call this number if you have problems the morning of surgery:  [phone]   Remember:  Follow the diet and prep instructions given to you by Dr Arnoldo Morale office.                     Take these medicines the morning of surgery with A SIP OF WATER  Protonix, topamax.    Do not wear jewelry, make-up or nail polish.  Do not wear lotions, powders, or perfumes. Please wear deodorant and brush your teeth.  Do not shave 48 hours prior to surgery.  Men may shave face and neck.  Do not bring valuables to the hospital.  [facility] is not responsible for any belongings or valuables.  Contacts, dentures or bridgework may not be worn into surgery.  Leave your suitcase in the car.  After surgery it may be brought to your room.  For patients admitted to the hospital, discharge time will be determined by your treatment team.  Patients discharged the day of surgery will not be allowed to drive home.   Name and phone number of your driver:   family Special instructions:  DO NOT smoke the morning of your procedure.  Please read over the following fact sheets that you were given. Anesthesia Post-op Instructions and Care and Recovery After Surgery       Open Colectomy, Care After This sheet gives you information about how to care for yourself after your procedure. Your health care provider may also give you more specific instructions. If you have problems or questions, contact your health care provider. What can I expect after the procedure? After the procedure, it is common to have:  Pain in your abdomen, especially along your incision.  Tiredness. Your energy level will return to normal over the next several weeks.  Constipation.  Nausea.  Difficulty urinating. Follow these instructions at home: Activity  You may be able to return  to most of your normal activities within 1-2 weeks, such as working, walking up stairs, and sexual activity.  Avoid activities that require a lot of energy for 4-6 weeks after surgery, such as running, climbing, and lifting heavy objects. Ask your health care provider what activities are safe for you.  Take rest breaks during the day as needed.  Do not drive for 1-2 weeks or until your health care provider says that it is safe.  Do not drive or use heavy machinery while taking prescription pain medicines.  Do not lift anything that is heavier than 10 lb (4.3 kg) until your health care provider says that it is safe. Incision care   Follow instructions from your health care provider about how to take care of your incision. Make sure you: ? Wash your hands with soap and water before you change your bandage (dressing). If soap and water are not available, use hand sanitizer. ? Change your dressing as told by your health care provider. ? Leave stitches (sutures) or staples in place. These skin closures may need to stay in place for 2 weeks or longer.  Avoid wearing tight clothing around your incision.  Protect your incision area from the sun.  Check your incision area every day for signs of infection. Check for: ? More redness, swelling, or pain. ?  More fluid or blood. ? Warmth. ? Pus or a bad smell. General instructions  Do not take baths, swim, or use a hot tub until your health care provider approves. Ask your health care provider when you may shower.  Take over-the-counter and prescription medicines, including stool softeners, only as told by your health care provider.  Eat a low-fat and low-fiber diet for the first 4 weeks after surgery.  Keep all follow-up visits as told by your health care provider. This is important. Contact a health care provider if:  You have more redness, swelling, or pain around your incision.  You have more fluid or blood coming from your  incision.  Your incision feels warm to the touch.  You have pus or a bad smell coming from your incision.  You have a fever or chills.  You do not have a bowel movement 2-3 days after surgery.  You cannot eat or drink for 24 hours or more.  You have persistent nausea and vomiting.  You have abdominal pain that gets worse and does not get better with medicine. Get help right away if:  You have chest pain.  You have shortness of breath.  You have pain or swelling in your legs.  Your incision breaks open after your sutures or staples have been removed.  You have bleeding from the rectum. This information is not intended to replace advice given to you by your health care provider. Make sure you discuss any questions you have with your health care provider. Document Revised: 03/07/2017 Document Reviewed: 12/25/2015 Elsevier Patient Education  2020 [place] Anesthesia, Adult, Care After This sheet gives you information about how to care for yourself after your procedure. Your health care provider may also give you more specific instructions. If you have problems or questions, contact your health care provider. What can I expect after the procedure? After the procedure, the following side effects are common:  Pain or discomfort at the IV site.  Nausea.  Vomiting.  Sore throat.  Trouble concentrating.  Feeling cold or chills.  Weak or tired.  Sleepiness and fatigue.  Soreness and body aches. These side effects can affect parts of the body that were not involved in surgery. Follow these instructions at home:  For at least 24 hours after the procedure:  Have a responsible adult stay with you. It is important to have someone help care for you until you are awake and alert.  Rest as needed.  Do not: ? Participate in activities in which you could fall or become injured. ? Drive. ? Use heavy machinery. ? Drink alcohol. ? Take sleeping pills or  medicines that cause drowsiness. ? Make important decisions or sign legal documents. ? Take care of children on your own. Eating and drinking  Follow any instructions from your health care provider about eating or drinking restrictions.  When you feel hungry, start by eating small amounts of foods that are soft and easy to digest (bland), such as toast. Gradually return to your regular diet.  Drink enough fluid to keep your urine pale yellow.  If you vomit, rehydrate by drinking water, juice, or clear broth. General instructions  If you have sleep apnea, surgery and certain medicines can increase your risk for breathing problems. Follow instructions from your health care provider about wearing your sleep device: ? Anytime you are sleeping, including during daytime naps. ? While taking prescription pain medicines, sleeping medicines, or medicines that make you drowsy.  Return to  your normal activities as told by your health care provider. Ask your health care provider what activities are safe for you.  Take over-the-counter and prescription medicines only as told by your health care provider.  If you smoke, do not smoke without supervision.  Keep all follow-up visits as told by your health care provider. This is important. Contact a health care provider if:  You have nausea or vomiting that does not get better with medicine.  You cannot eat or drink without vomiting.  You have pain that does not get better with medicine.  You are unable to pass urine.  You develop a skin rash.  You have a fever.  You have redness around your IV site that gets worse. Get help right away if:  You have difficulty breathing.  You have chest pain.  You have blood in your urine or stool, or you vomit blood. Summary  After the procedure, it is common to have a sore throat or nausea. It is also common to feel tired.  Have a responsible adult stay with you for the first 24 hours after general  anesthesia. It is important to have someone help care for you until you are awake and alert.  When you feel hungry, start by eating small amounts of foods that are soft and easy to digest (bland), such as toast. Gradually return to your regular diet.  Drink enough fluid to keep your urine pale yellow.  Return to your normal activities as told by your health care provider. Ask your health care provider what activities are safe for you. This information is not intended to replace advice given to you by your health care provider. Make sure you discuss any questions you have with your health care provider. Document Revised: 03/28/2017 Document Reviewed: 11/08/2016 Elsevier Patient Education  [place]. How to Use Chlorhexidine for Bathing Chlorhexidine gluconate (CHG) is a germ-killing (antiseptic) solution that is used to clean the skin. It can get rid of the bacteria that normally live on the skin and can keep them away for about 24 hours. To clean your skin with CHG, you may be given:  A CHG solution to use in the shower or as part of a sponge bath.  A prepackaged cloth that contains CHG. Cleaning your skin with CHG may help lower the risk for infection:  While you are staying in the intensive care unit of the hospital.  If you have a vascular access, such as a central line, to provide short-term or long-term access to your veins.  If you have a catheter to drain urine from your bladder.  If you are on a ventilator. A ventilator is a machine that helps you breathe by moving air in and out of your lungs.  After surgery. What are the risks? Risks of using CHG include:  A skin reaction.  Hearing loss, if CHG gets in your ears.  Eye injury, if CHG gets in your eyes and is not rinsed out.  The CHG product catching fire. Make sure that you avoid smoking and flames after applying CHG to your skin. Do not use CHG:  If you have a chlorhexidine allergy or have previously reacted  to chlorhexidine.  On babies younger than 48 months of age. How to use CHG solution  Use CHG only as told by your health care provider, and follow the instructions on the label.  Use the full amount of CHG as directed. Usually, this is one bottle. During a shower Follow  these steps when using CHG solution during a shower (unless your health care provider gives you different instructions): 1. Start the shower. 2. Use your normal soap and shampoo to wash your face and hair. 3. Turn off the shower or move out of the shower stream. 4. Pour the CHG onto a clean washcloth. Do not use any type of brush or rough-edged sponge. 5. Starting at your neck, lather your body down to your toes. Make sure you follow these instructions: ? If you will be having surgery, pay special attention to the part of your body where you will be having surgery. Scrub this area for at least 1 minute. ? Do not use CHG on your head or face. If the solution gets into your ears or eyes, rinse them well with water. ? Avoid your genital area. ? Avoid any areas of skin that have broken skin, cuts, or scrapes. ? Scrub your back and under your arms. Make sure to wash skin folds. 6. Let the lather sit on your skin for 1-2 minutes or as long as told by your health care provider. 7. Thoroughly rinse your entire body in the shower. Make sure that all body creases and crevices are rinsed well. 8. Dry off with a clean towel. Do not put any substances on your body afterward--such as powder, lotion, or perfume--unless you are told to do so by your health care provider. Only use lotions that are recommended by the manufacturer. 9. Put on clean clothes or pajamas. 10. If it is the night before your surgery, sleep in clean sheets.  During a sponge bath Follow these steps when using CHG solution during a sponge bath (unless your health care provider gives you different instructions): 1. Use your normal soap and shampoo to wash your face and  hair. 2. Pour the CHG onto a clean washcloth. 3. Starting at your neck, lather your body down to your toes. Make sure you follow these instructions: ? If you will be having surgery, pay special attention to the part of your body where you will be having surgery. Scrub this area for at least 1 minute. ? Do not use CHG on your head or face. If the solution gets into your ears or eyes, rinse them well with water. ? Avoid your genital area. ? Avoid any areas of skin that have broken skin, cuts, or scrapes. ? Scrub your back and under your arms. Make sure to wash skin folds. 4. Let the lather sit on your skin for 1-2 minutes or as long as told by your health care provider. 5. Using a different clean, wet washcloth, thoroughly rinse your entire body. Make sure that all body creases and crevices are rinsed well. 6. Dry off with a clean towel. Do not put any substances on your body afterward--such as powder, lotion, or perfume--unless you are told to do so by your health care provider. Only use lotions that are recommended by the manufacturer. 7. Put on clean clothes or pajamas. 8. If it is the night before your surgery, sleep in clean sheets. How to use CHG prepackaged cloths  Only use CHG cloths as told by your health care provider, and follow the instructions on the label.  Use the CHG cloth on clean, dry skin.  Do not use the CHG cloth on your head or face unless your health care provider tells you to.  When washing with the CHG cloth: ? Avoid your genital area. ? Avoid any areas of skin that  have broken skin, cuts, or scrapes. Before surgery Follow these steps when using a CHG cloth to clean before surgery (unless your health care provider gives you different instructions): 1. Using the CHG cloth, vigorously scrub the part of your body where you will be having surgery. Scrub using a back-and-forth motion for 3 minutes. The area on your body should be completely wet with CHG when you are done  scrubbing. 2. Do not rinse. Discard the cloth and let the area air-dry. Do not put any substances on the area afterward, such as powder, lotion, or perfume. 3. Put on clean clothes or pajamas. 4. If it is the night before your surgery, sleep in clean sheets.  For general bathing Follow these steps when using CHG cloths for general bathing (unless your health care provider gives you different instructions). 1. Use a separate CHG cloth for each area of your body. Make sure you wash between any folds of skin and between your fingers and toes. Wash your body in the following order, switching to a new cloth after each step: ? The front of your neck, shoulders, and chest. ? Both of your arms, under your arms, and your hands. ? Your stomach and groin area, avoiding the genitals. ? Your right leg and foot. ? Your left leg and foot. ? The back of your neck, your back, and your buttocks. 2. Do not rinse. Discard the cloth and let the area air-dry. Do not put any substances on your body afterward--such as powder, lotion, or perfume--unless you are told to do so by your health care provider. Only use lotions that are recommended by the manufacturer. 3. Put on clean clothes or pajamas. Contact a health care provider if:  Your skin gets irritated after scrubbing.  You have questions about using your solution or cloth. Get help right away if:  Your eyes become very red or swollen.  Your eyes itch badly.  Your skin itches badly and is red or swollen.  Your hearing changes.  You have trouble seeing.  You have swelling or tingling in your mouth or throat.  You have trouble breathing.  You swallow any chlorhexidine. Summary  Chlorhexidine gluconate (CHG) is a germ-killing (antiseptic) solution that is used to clean the skin. Cleaning your skin with CHG may help to lower your risk for infection.  You may be given CHG to use for bathing. It may be in a bottle or in a prepackaged cloth to use on  your skin. Carefully follow your health care provider's instructions and the instructions on the product label.  Do not use CHG if you have a chlorhexidine allergy.  Contact your health care provider if your skin gets irritated after scrubbing. This information is not intended to replace advice given to you by your health care provider. Make sure you discuss any questions you have with your health care provider. Document Revised: 06/11/2018 Document Reviewed: 02/20/2017 Elsevier Patient Education  [place].

## 2019-07-26 ENCOUNTER — Ambulatory Visit (HOSPITAL_COMMUNITY)
Admission: RE | Admit: 2019-07-26 | Discharge: 2019-07-26 | Disposition: A | Discharge status: Home / Self Care | Payer: 59 | Source: Ambulatory Visit | Attending: General Surgery | Admitting: General Surgery

## 2019-07-26 ENCOUNTER — Other Ambulatory Visit: Payer: Self-pay

## 2019-07-26 ENCOUNTER — Other Ambulatory Visit (HOSPITAL_COMMUNITY)
Admission: RE | Admit: 2019-07-26 | Discharge: 2019-07-26 | Disposition: A | Discharge status: Home / Self Care | Payer: 59 | Source: Ambulatory Visit | Attending: General Surgery | Admitting: General Surgery

## 2019-07-26 ENCOUNTER — Encounter (HOSPITAL_COMMUNITY)
Admission: RE | Admit: 2019-07-26 | Discharge: 2019-07-26 | Disposition: A | Discharge status: Home / Self Care | Payer: 59 | Source: Ambulatory Visit | Attending: General Surgery | Admitting: General Surgery

## 2019-07-26 DIAGNOSIS — E119 Type 2 diabetes mellitus without complications: Secondary | ICD-10-CM | POA: Diagnosis not present

## 2019-07-26 DIAGNOSIS — Z87891 Personal history of nicotine dependence: Secondary | ICD-10-CM | POA: Insufficient documentation

## 2019-07-26 DIAGNOSIS — Z01818 Encounter for other preprocedural examination: Secondary | ICD-10-CM | POA: Insufficient documentation

## 2019-07-26 DIAGNOSIS — Z20822 Contact with and (suspected) exposure to covid-19: Secondary | ICD-10-CM | POA: Insufficient documentation

## 2019-07-26 DIAGNOSIS — I10 Essential (primary) hypertension: Secondary | ICD-10-CM | POA: Insufficient documentation

## 2019-07-26 DIAGNOSIS — C189 Malignant neoplasm of colon, unspecified: Secondary | ICD-10-CM | POA: Insufficient documentation

## 2019-07-26 LAB — CBC WITH DIFFERENTIAL/PLATELET
Abs Immature Granulocytes: 0.02 10*3/uL (ref 0.00–0.07)
Basophils Absolute: 0 10*3/uL (ref 0.0–0.1)
Basophils Relative: 1 %
Eosinophils Absolute: 0.4 10*3/uL (ref 0.0–0.5)
Eosinophils Relative: 9 %
HCT: 43.4 % (ref 39.0–52.0)
Hemoglobin: 14.5 g/dL (ref 13.0–17.0)
Immature Granulocytes: 0 %
Lymphocytes Relative: 31 %
Lymphs Abs: 1.6 10*3/uL (ref 0.7–4.0)
MCH: 28.8 pg (ref 26.0–34.0)
MCHC: 33.4 g/dL (ref 30.0–36.0)
MCV: 86.1 fL (ref 80.0–100.0)
Monocytes Absolute: 0.3 10*3/uL (ref 0.1–1.0)
Monocytes Relative: 6 %
Neutro Abs: 2.6 10*3/uL (ref 1.7–7.7)
Neutrophils Relative %: 53 %
Platelets: 152 10*3/uL (ref 150–400)
RBC: 5.04 MIL/uL (ref 4.22–5.81)
RDW: 13.5 % (ref 11.5–15.5)
WBC: 5 10*3/uL (ref 4.0–10.5)
nRBC: 0 % (ref 0.0–0.2)

## 2019-07-26 LAB — COMPREHENSIVE METABOLIC PANEL
ALT: 70 U/L — ABNORMAL HIGH (ref 0–44)
AST: 43 U/L — ABNORMAL HIGH (ref 15–41)
Albumin: 3.6 g/dL (ref 3.5–5.0)
Alkaline Phosphatase: 48 U/L (ref 38–126)
Anion gap: 9 (ref 5–15)
BUN: 14 mg/dL (ref 6–20)
CO2: 21 mmol/L — ABNORMAL LOW (ref 22–32)
Calcium: 8.6 mg/dL — ABNORMAL LOW (ref 8.9–10.3)
Chloride: 108 mmol/L (ref 98–111)
Creatinine, Ser: 0.78 mg/dL (ref 0.61–1.24)
GFR calc Af Amer: >60 mL/min (ref >60)
GFR calc non Af Amer: >60 mL/min (ref >60)
Glucose, Bld: 315 mg/dL — ABNORMAL HIGH (ref 70–99)
Potassium: 4.1 mmol/L (ref 3.5–5.1)
Sodium: 138 mmol/L (ref 135–145)
Total Bilirubin: 0.5 mg/dL (ref 0.3–1.2)
Total Protein: 6 g/dL — ABNORMAL LOW (ref 6.5–8.1)

## 2019-07-26 LAB — HEMOGLOBIN A1C
Hgb A1c MFr Bld: 8.4 % — ABNORMAL HIGH (ref 4.8–5.6)
Mean Plasma Glucose: 194.38 mg/dL

## 2019-07-26 LAB — SARS CORONAVIRUS 2 (TAT 6-24 HRS): SARS Coronavirus 2: NEGATIVE

## 2019-07-26 LAB — PREPARE RBC (CROSSMATCH)

## 2019-07-26 LAB — ABO/RH: ABO/RH(D): B POS

## 2019-07-26 NOTE — Progress Notes (Signed)
Dr Arnoldo Morale notified of HGB A1C 8.4 and glucose 315

## 2019-07-26 NOTE — Progress Notes (Signed)
Instructed patient to be sure not to miss any doses of diabetic medication follow his diabetic diet

## 2019-07-28 ENCOUNTER — Encounter (HOSPITAL_COMMUNITY)
Admission: RE | Disposition: A | Discharge status: Home / Self Care | Payer: Self-pay | Source: Home / Self Care | Attending: General Surgery

## 2019-07-28 ENCOUNTER — Inpatient Hospital Stay (HOSPITAL_COMMUNITY)
Admission: RE | Admit: 2019-07-28 | Discharge: 2019-07-31 | DRG: 330 | Disposition: A | Discharge status: Home / Self Care | Payer: No Typology Code available for payment source | Attending: General Surgery | Admitting: General Surgery

## 2019-07-28 ENCOUNTER — Encounter (HOSPITAL_COMMUNITY): Payer: Self-pay | Admitting: General Surgery

## 2019-07-28 ENCOUNTER — Inpatient Hospital Stay (HOSPITAL_COMMUNITY): Payer: No Typology Code available for payment source | Admitting: Anesthesiology

## 2019-07-28 ENCOUNTER — Other Ambulatory Visit: Payer: Self-pay

## 2019-07-28 DIAGNOSIS — Z85038 Personal history of other malignant neoplasm of large intestine: Secondary | ICD-10-CM | POA: Diagnosis not present

## 2019-07-28 DIAGNOSIS — C186 Malignant neoplasm of descending colon: Principal | ICD-10-CM

## 2019-07-28 DIAGNOSIS — Z803 Family history of malignant neoplasm of breast: Secondary | ICD-10-CM | POA: Diagnosis not present

## 2019-07-28 DIAGNOSIS — Z7984 Long term (current) use of oral hypoglycemic drugs: Secondary | ICD-10-CM

## 2019-07-28 DIAGNOSIS — E1165 Type 2 diabetes mellitus with hyperglycemia: Secondary | ICD-10-CM | POA: Diagnosis not present

## 2019-07-28 DIAGNOSIS — Z9221 Personal history of antineoplastic chemotherapy: Secondary | ICD-10-CM | POA: Diagnosis not present

## 2019-07-28 DIAGNOSIS — Z825 Family history of asthma and other chronic lower respiratory diseases: Secondary | ICD-10-CM

## 2019-07-28 DIAGNOSIS — Z87891 Personal history of nicotine dependence: Secondary | ICD-10-CM | POA: Diagnosis not present

## 2019-07-28 DIAGNOSIS — E7800 Pure hypercholesterolemia, unspecified: Secondary | ICD-10-CM | POA: Diagnosis present

## 2019-07-28 DIAGNOSIS — C772 Secondary and unspecified malignant neoplasm of intra-abdominal lymph nodes: Secondary | ICD-10-CM | POA: Diagnosis present

## 2019-07-28 DIAGNOSIS — Z79899 Other long term (current) drug therapy: Secondary | ICD-10-CM | POA: Diagnosis not present

## 2019-07-28 DIAGNOSIS — I10 Essential (primary) hypertension: Secondary | ICD-10-CM | POA: Diagnosis present

## 2019-07-28 DIAGNOSIS — Z87442 Personal history of urinary calculi: Secondary | ICD-10-CM | POA: Diagnosis not present

## 2019-07-28 DIAGNOSIS — K219 Gastro-esophageal reflux disease without esophagitis: Secondary | ICD-10-CM | POA: Diagnosis present

## 2019-07-28 DIAGNOSIS — Z9049 Acquired absence of other specified parts of digestive tract: Secondary | ICD-10-CM

## 2019-07-28 DIAGNOSIS — Z801 Family history of malignant neoplasm of trachea, bronchus and lung: Secondary | ICD-10-CM

## 2019-07-28 DIAGNOSIS — K660 Peritoneal adhesions (postprocedural) (postinfection): Secondary | ICD-10-CM | POA: Diagnosis present

## 2019-07-28 HISTORY — PX: BOWEL RESECTION: SHX1257

## 2019-07-28 LAB — GLUCOSE, CAPILLARY
Glucose-Capillary: 192 mg/dL — ABNORMAL HIGH (ref 70–99)
Glucose-Capillary: 256 mg/dL — ABNORMAL HIGH (ref 70–99)
Glucose-Capillary: 270 mg/dL — ABNORMAL HIGH (ref 70–99)
Glucose-Capillary: 269 mg/dL — ABNORMAL HIGH (ref 70–99)
Glucose-Capillary: 335 mg/dL — ABNORMAL HIGH (ref 70–99)

## 2019-07-28 LAB — MRSA PCR SCREENING: MRSA by PCR: NEGATIVE

## 2019-07-28 SURGERY — RESECTION, RECTUM, LOW ANTERIOR
Anesthesia: General

## 2019-07-28 MED ORDER — INSULIN ASPART 100 UNIT/ML ~~LOC~~ SOLN
0.0000 [IU] | Freq: Every day | SUBCUTANEOUS | Status: DC
  Administered 2019-07-28: 4 [IU] via SUBCUTANEOUS

## 2019-07-28 MED ORDER — ACETAMINOPHEN 325 MG PO TABS: 650.0000 mg | ORAL_TABLET | Freq: Four times a day (QID) | ORAL | Status: DC | PRN

## 2019-07-28 MED ORDER — BUPIVACAINE LIPOSOME 1.3 % IJ SUSP
INTRAMUSCULAR | Status: AC
  Filled 2019-07-28: qty 20

## 2019-07-28 MED ORDER — TOPIRAMATE 100 MG PO TABS
100.0000 mg | ORAL_TABLET | Freq: Every day | ORAL | Status: DC
  Administered 2019-07-29 – 2019-07-31 (×3): 100 mg via ORAL
  Filled 2019-07-28 (×4): qty 1

## 2019-07-28 MED ORDER — LORAZEPAM 2 MG/ML IJ SOLN
1.0000 mg | INTRAMUSCULAR | Status: DC | PRN
  Administered 2019-07-28 – 2019-07-29 (×2): 1 mg via INTRAVENOUS
  Filled 2019-07-28 (×2): qty 1

## 2019-07-28 MED ORDER — OXYCODONE-ACETAMINOPHEN 5-325 MG PO TABS
1.0000 | ORAL_TABLET | ORAL | Status: DC | PRN
  Administered 2019-07-28 – 2019-07-31 (×10): 2 via ORAL
  Filled 2019-07-28 (×4): qty 2
  Filled 2019-07-28: qty 1
  Filled 2019-07-28 (×3): qty 2
  Filled 2019-07-28: qty 1
  Filled 2019-07-28 (×2): qty 2

## 2019-07-28 MED ORDER — PANTOPRAZOLE SODIUM 40 MG PO TBEC
40.0000 mg | DELAYED_RELEASE_TABLET | Freq: Every day | ORAL | Status: DC
  Administered 2019-07-29 – 2019-07-31 (×3): 40 mg via ORAL
  Filled 2019-07-28 (×4): qty 1

## 2019-07-28 MED ORDER — SIMETHICONE 80 MG PO CHEW: 40.0000 mg | CHEWABLE_TABLET | Freq: Four times a day (QID) | ORAL | Status: DC | PRN

## 2019-07-28 MED ORDER — HYDROMORPHONE HCL 1 MG/ML IJ SOLN
0.2500 mg | INTRAMUSCULAR | Status: DC | PRN
  Administered 2019-07-28 (×3): 0.5 mg via INTRAVENOUS
  Filled 2019-07-28 (×3): qty 0.5

## 2019-07-28 MED ORDER — ALVIMOPAN 12 MG PO CAPS
ORAL_CAPSULE | ORAL | Status: AC
  Filled 2019-07-28: qty 1

## 2019-07-28 MED ORDER — HYDROMORPHONE HCL 1 MG/ML IJ SOLN
1.0000 mg | INTRAMUSCULAR | Status: DC | PRN
  Administered 2019-07-28 – 2019-07-30 (×12): 1 mg via INTRAVENOUS
  Filled 2019-07-28 (×14): qty 1

## 2019-07-28 MED ORDER — LACTATED RINGERS IV SOLN: INTRAVENOUS | Status: DC

## 2019-07-28 MED ORDER — MAGNESIUM OXIDE 400 (241.3 MG) MG PO TABS
400.0000 mg | ORAL_TABLET | Freq: Every day | ORAL | Status: DC
  Administered 2019-07-28 – 2019-07-31 (×4): 400 mg via ORAL
  Filled 2019-07-28 (×7): qty 1

## 2019-07-28 MED ORDER — LIDOCAINE 2% (20 MG/ML) 5 ML SYRINGE
INTRAMUSCULAR | Status: DC | PRN
  Administered 2019-07-28: 100 mg via INTRAVENOUS

## 2019-07-28 MED ORDER — KETOROLAC TROMETHAMINE 30 MG/ML IJ SOLN
30.0000 mg | Freq: Four times a day (QID) | INTRAMUSCULAR | Status: DC | PRN
  Filled 2019-07-28: qty 1

## 2019-07-28 MED ORDER — ONDANSETRON HCL 4 MG/2ML IJ SOLN
INTRAMUSCULAR | Status: DC | PRN
  Administered 2019-07-28: 4 mg via INTRAVENOUS

## 2019-07-28 MED ORDER — SODIUM CHLORIDE 0.9 % IV SOLN
2.0000 g | INTRAVENOUS | Status: AC
  Administered 2019-07-28: 2 g via INTRAVENOUS

## 2019-07-28 MED ORDER — DIPHENHYDRAMINE HCL 25 MG PO CAPS: 25.0000 mg | ORAL_CAPSULE | Freq: Four times a day (QID) | ORAL | Status: DC | PRN

## 2019-07-28 MED ORDER — ONDANSETRON HCL 4 MG/2ML IJ SOLN: 4.0000 mg | Freq: Four times a day (QID) | INTRAMUSCULAR | Status: DC | PRN

## 2019-07-28 MED ORDER — PROPOFOL 10 MG/ML IV BOLUS
INTRAVENOUS | Status: DC | PRN
  Administered 2019-07-28: 150 mg via INTRAVENOUS

## 2019-07-28 MED ORDER — BUPIVACAINE LIPOSOME 1.3 % IJ SUSP
INTRAMUSCULAR | Status: DC | PRN
  Administered 2019-07-28: 20 mL

## 2019-07-28 MED ORDER — POVIDONE-IODINE 10 % OINT PACKET
TOPICAL_OINTMENT | CUTANEOUS | Status: DC | PRN
  Administered 2019-07-28: 1 via TOPICAL

## 2019-07-28 MED ORDER — INSULIN ASPART 100 UNIT/ML ~~LOC~~ SOLN
0.0000 [IU] | Freq: Three times a day (TID) | SUBCUTANEOUS | Status: DC
  Administered 2019-07-29: 3 [IU] via SUBCUTANEOUS
  Administered 2019-07-29: 4 [IU] via SUBCUTANEOUS
  Administered 2019-07-29: 3 [IU] via SUBCUTANEOUS
  Administered 2019-07-30: 4 [IU] via SUBCUTANEOUS
  Administered 2019-07-30: 3 [IU] via SUBCUTANEOUS
  Administered 2019-07-30: 7 [IU] via SUBCUTANEOUS
  Administered 2019-07-31: 11 [IU] via SUBCUTANEOUS

## 2019-07-28 MED ORDER — ENOXAPARIN SODIUM 40 MG/0.4ML ~~LOC~~ SOLN: 40.0000 mg | SUBCUTANEOUS | Status: DC

## 2019-07-28 MED ORDER — ROCURONIUM BROMIDE 10 MG/ML (PF) SYRINGE
PREFILLED_SYRINGE | INTRAVENOUS | Status: AC
  Filled 2019-07-28: qty 20

## 2019-07-28 MED ORDER — SURGILUBE EX GEL
CUTANEOUS | Status: DC | PRN
  Administered 2019-07-28: 1 via TOPICAL

## 2019-07-28 MED ORDER — FENTANYL CITRATE (PF) 100 MCG/2ML IJ SOLN
INTRAMUSCULAR | Status: DC | PRN
  Administered 2019-07-28 (×3): 50 ug via INTRAVENOUS
  Administered 2019-07-28: 100 ug via INTRAVENOUS
  Administered 2019-07-28 (×2): 50 ug via INTRAVENOUS

## 2019-07-28 MED ORDER — POVIDONE-IODINE 10 % EX OINT
TOPICAL_OINTMENT | CUTANEOUS | Status: AC
  Filled 2019-07-28: qty 1

## 2019-07-28 MED ORDER — LACTATED RINGERS IV SOLN: INTRAVENOUS | Status: DC | PRN

## 2019-07-28 MED ORDER — MIDAZOLAM HCL 2 MG/2ML IJ SOLN
INTRAMUSCULAR | Status: AC
  Filled 2019-07-28: qty 2

## 2019-07-28 MED ORDER — KETOROLAC TROMETHAMINE 30 MG/ML IJ SOLN
30.0000 mg | Freq: Four times a day (QID) | INTRAMUSCULAR | Status: AC
  Administered 2019-07-28: 30 mg via INTRAVENOUS
  Filled 2019-07-28: qty 1

## 2019-07-28 MED ORDER — CHLORHEXIDINE GLUCONATE CLOTH 2 % EX PADS: 6.0000 | MEDICATED_PAD | Freq: Once | CUTANEOUS | Status: DC

## 2019-07-28 MED ORDER — SODIUM CHLORIDE 0.9 % IR SOLN
Status: DC | PRN
  Administered 2019-07-28 (×3): 1000 mL

## 2019-07-28 MED ORDER — LIDOCAINE 2% (20 MG/ML) 5 ML SYRINGE
INTRAMUSCULAR | Status: AC
  Filled 2019-07-28: qty 10

## 2019-07-28 MED ORDER — SODIUM CHLORIDE 0.9 % IV SOLN: INTRAVENOUS | Status: DC

## 2019-07-28 MED ORDER — METHOCARBAMOL 500 MG PO TABS
500.0000 mg | ORAL_TABLET | Freq: Four times a day (QID) | ORAL | Status: DC | PRN
  Administered 2019-07-28: 500 mg via ORAL

## 2019-07-28 MED ORDER — ENOXAPARIN SODIUM 40 MG/0.4ML ~~LOC~~ SOLN
40.0000 mg | Freq: Once | SUBCUTANEOUS | Status: AC
  Administered 2019-07-28: 40 mg via SUBCUTANEOUS
  Filled 2019-07-28: qty 0.4

## 2019-07-28 MED ORDER — ONDANSETRON HCL 4 MG/2ML IJ SOLN: 4.0000 mg | Freq: Once | INTRAMUSCULAR | Status: DC | PRN

## 2019-07-28 MED ORDER — ALVIMOPAN 12 MG PO CAPS
12.0000 mg | ORAL_CAPSULE | Freq: Two times a day (BID) | ORAL | Status: DC
  Administered 2019-07-29 (×2): 12 mg via ORAL
  Filled 2019-07-28 (×3): qty 1

## 2019-07-28 MED ORDER — METHOCARBAMOL 500 MG PO TABS
ORAL_TABLET | ORAL | Status: AC
  Filled 2019-07-28: qty 1

## 2019-07-28 MED ORDER — SUGAMMADEX SODIUM 200 MG/2ML IV SOLN
INTRAVENOUS | Status: DC | PRN
  Administered 2019-07-28: 200 mg via INTRAVENOUS

## 2019-07-28 MED ORDER — ENOXAPARIN SODIUM 60 MG/0.6ML ~~LOC~~ SOLN
55.0000 mg | SUBCUTANEOUS | Status: DC
  Administered 2019-07-29 – 2019-07-31 (×3): 55 mg via SUBCUTANEOUS
  Filled 2019-07-28 (×3): qty 0.6

## 2019-07-28 MED ORDER — ALVIMOPAN 12 MG PO CAPS
12.0000 mg | ORAL_CAPSULE | ORAL | Status: AC
  Administered 2019-07-28: 12 mg via ORAL

## 2019-07-28 MED ORDER — ACETAMINOPHEN 650 MG RE SUPP: 650.0000 mg | Freq: Four times a day (QID) | RECTAL | Status: DC | PRN

## 2019-07-28 MED ORDER — FENTANYL CITRATE (PF) 250 MCG/5ML IJ SOLN
INTRAMUSCULAR | Status: AC
  Filled 2019-07-28: qty 5

## 2019-07-28 MED ORDER — SODIUM CHLORIDE 0.9 % IV SOLN
INTRAVENOUS | Status: AC
  Filled 2019-07-28: qty 2

## 2019-07-28 MED ORDER — DEXAMETHASONE SODIUM PHOSPHATE 10 MG/ML IJ SOLN
INTRAMUSCULAR | Status: DC | PRN
  Administered 2019-07-28: 10 mg via INTRAVENOUS

## 2019-07-28 MED ORDER — ROCURONIUM BROMIDE 50 MG/5ML IV SOSY
PREFILLED_SYRINGE | INTRAVENOUS | Status: DC | PRN
  Administered 2019-07-28 (×2): 20 mg via INTRAVENOUS
  Administered 2019-07-28: 60 mg via INTRAVENOUS

## 2019-07-28 MED ORDER — ONDANSETRON 4 MG PO TBDP: 4.0000 mg | ORAL_TABLET | Freq: Four times a day (QID) | ORAL | Status: DC | PRN

## 2019-07-28 MED ORDER — MIDAZOLAM HCL 5 MG/5ML IJ SOLN
INTRAMUSCULAR | Status: DC | PRN
  Administered 2019-07-28: 2 mg via INTRAVENOUS

## 2019-07-28 MED ORDER — PRAVASTATIN SODIUM 10 MG PO TABS
20.0000 mg | ORAL_TABLET | Freq: Every day | ORAL | Status: DC
  Administered 2019-07-28 – 2019-07-31 (×4): 20 mg via ORAL
  Filled 2019-07-28 (×4): qty 2

## 2019-07-28 MED ORDER — LOSARTAN POTASSIUM 50 MG PO TABS
100.0000 mg | ORAL_TABLET | Freq: Every day | ORAL | Status: DC
  Administered 2019-07-29 – 2019-07-31 (×3): 100 mg via ORAL
  Filled 2019-07-28 (×4): qty 2

## 2019-07-28 MED ORDER — ADULT MULTIVITAMIN W/MINERALS CH
1.0000 | ORAL_TABLET | Freq: Every day | ORAL | Status: DC
  Administered 2019-07-28 – 2019-07-31 (×4): 1 via ORAL
  Filled 2019-07-28 (×4): qty 1

## 2019-07-28 MED ORDER — DIPHENHYDRAMINE HCL 50 MG/ML IJ SOLN: 25.0000 mg | Freq: Four times a day (QID) | INTRAMUSCULAR | Status: DC | PRN

## 2019-07-28 MED ORDER — PHENYLEPHRINE HCL (PRESSORS) 10 MG/ML IV SOLN
INTRAVENOUS | Status: DC | PRN
  Administered 2019-07-28: 80 ug via INTRAVENOUS

## 2019-07-28 MED ORDER — INSULIN ASPART 100 UNIT/ML ~~LOC~~ SOLN
0.0000 [IU] | Freq: Three times a day (TID) | SUBCUTANEOUS | Status: DC
  Administered 2019-07-28 (×2): 11 [IU] via SUBCUTANEOUS
  Filled 2019-07-28: qty 0.2

## 2019-07-28 MED ORDER — ALBUTEROL SULFATE (2.5 MG/3ML) 0.083% IN NEBU
3.0000 mL | INHALATION_SOLUTION | Freq: Four times a day (QID) | RESPIRATORY_TRACT | Status: DC | PRN
  Administered 2019-07-29: 3 mL via RESPIRATORY_TRACT
  Filled 2019-07-28: qty 3

## 2019-07-28 MED ORDER — FENTANYL CITRATE (PF) 100 MCG/2ML IJ SOLN
INTRAMUSCULAR | Status: AC
  Filled 2019-07-28: qty 2

## 2019-07-28 SURGICAL SUPPLY — 46 items
BLADE SURG SZ10 CARB STEEL (BLADE) ×2 IMPLANT
COVER LIGHT HANDLE STERIS (MISCELLANEOUS) ×8 IMPLANT
COVER WAND RF STERILE (DRAPES) ×2 IMPLANT
DRSG OPSITE POSTOP 4X10 (GAUZE/BANDAGES/DRESSINGS) ×2 IMPLANT
ELECT REM PT RETURN 9FT ADLT (ELECTROSURGICAL) ×2
ELECTRODE REM PT RTRN 9FT ADLT (ELECTROSURGICAL) ×1 IMPLANT
GAUZE SPONGE 4X4 12PLY STRL (GAUZE/BANDAGES/DRESSINGS) ×2 IMPLANT
GLOVE BIO SURGEON STRL SZ 6.5 (GLOVE) ×4 IMPLANT
GLOVE BIO SURGEON STRL SZ7 (GLOVE) ×3 IMPLANT
GLOVE BIOGEL PI IND STRL 6.5 (GLOVE) IMPLANT
GLOVE BIOGEL PI IND STRL 7.0 (GLOVE) ×6 IMPLANT
GLOVE BIOGEL PI INDICATOR 6.5 (GLOVE) ×2
GLOVE BIOGEL PI INDICATOR 7.0 (GLOVE) ×6
GLOVE SURG SS PI 7.5 STRL IVOR (GLOVE) ×5 IMPLANT
GLOVE SURG SS PI 8.0 STRL IVOR (GLOVE) ×1 IMPLANT
GOWN STRL REUS W/TWL LRG LVL3 (GOWN DISPOSABLE) ×15 IMPLANT
INST SET MAJOR GENERAL (KITS) ×2 IMPLANT
KIT BLADEGUARD II DBL (SET/KITS/TRAYS/PACK) ×2 IMPLANT
KIT TURNOVER KIT A (KITS) ×2 IMPLANT
LIGASURE IMPACT 36 18CM CVD LR (INSTRUMENTS) ×2 IMPLANT
MANIFOLD NEPTUNE II (INSTRUMENTS) ×3 IMPLANT
NEEDLE HYPO 18GX1.5 BLUNT FILL (NEEDLE) ×2 IMPLANT
NEEDLE HYPO 22GX1.5 SAFETY (NEEDLE) ×2 IMPLANT
NS IRRIG 1000ML POUR BTL (IV SOLUTION) ×5 IMPLANT
PACK COLON (CUSTOM PROCEDURE TRAY) ×2 IMPLANT
PAD ARMBOARD 7.5X6 YLW CONV (MISCELLANEOUS) ×2 IMPLANT
PENCIL SMOKE EVACUATOR COATED (MISCELLANEOUS) ×2 IMPLANT
RELOAD PROXIMATE 75MM BLUE (ENDOMECHANICALS) ×4 IMPLANT
RETRACTOR WND ALEXIS 25 LRG (MISCELLANEOUS) IMPLANT
RTRCTR WOUND ALEXIS 25CM LRG (MISCELLANEOUS) ×2
SHEET LAVH (DRAPES) ×2 IMPLANT
SPONGE INTESTINAL PEANUT (DISPOSABLE) ×1 IMPLANT
SPONGE LAP 18X18 RF (DISPOSABLE) ×4 IMPLANT
STAPLER CIRC CVD 29MM 37CM (STAPLE) ×2 IMPLANT
STAPLER CUT CVD 40MM GREEN (STAPLE) ×1 IMPLANT
STAPLER PROXIMATE 75MM BLUE (STAPLE) ×1 IMPLANT
STAPLER RELOADABLE 65 2-0 SUT (MISCELLANEOUS) IMPLANT
STAPLER SYS INTERNAL RELOAD SS (MISCELLANEOUS) ×2 IMPLANT
STAPLER VISISTAT (STAPLE) ×2 IMPLANT
SURGILUBE 2OZ TUBE FLIPTOP (MISCELLANEOUS) ×2 IMPLANT
SUT PDS AB 0 CTX 60 (SUTURE) ×4 IMPLANT
SUT SILK 0 FSL (SUTURE) ×1 IMPLANT
SUT SILK 3 0 SH CR/8 (SUTURE) ×2 IMPLANT
SYR 20ML LL LF (SYRINGE) ×4 IMPLANT
TRAY FOLEY MTR SLVR 16FR STAT (SET/KITS/TRAYS/PACK) ×2 IMPLANT
YANKAUER SUCT BULB TIP 10FT TU (MISCELLANEOUS) ×4 IMPLANT

## 2019-07-28 NOTE — Interval H&P Note (Signed)
History and Physical Interval Note:  07/28/2019 8:16 AM  Joel Jefferson.  has presented today for surgery, with the diagnosis of Colon Cancer.  The various methods of treatment have been discussed with the patient and family. After consideration of risks, benefits and other options for treatment, the patient has consented to  Procedure(s): LOW ANTERIOR BOWEL RESECTION (N/A) as a surgical intervention.  The patient's history has been reviewed, patient examined, no change in status, stable for surgery.  I have reviewed the patient's chart and labs.  Questions were answered to the patient's satisfaction.     Aviva Signs

## 2019-07-28 NOTE — Anesthesia Preprocedure Evaluation (Signed)
Anesthesia Evaluation  Patient identified by MRN, date of birth, ID band Patient awake    Reviewed: Allergy & Precautions, H&P , NPO status , Patient's Chart, lab work & pertinent test results, reviewed documented beta blocker date and time   Airway Mallampati: II  TM Distance: >3 FB Neck ROM: full    Dental no notable dental hx. (+) Teeth Intact   Pulmonary neg pulmonary ROS, former smoker,    Pulmonary exam normal breath sounds clear to auscultation       Cardiovascular Exercise Tolerance: Good hypertension, negative cardio ROS   Rhythm:regular Rate:Normal     Neuro/Psych negative neurological ROS  negative psych ROS   GI/Hepatic Neg liver ROS, GERD  Medicated,  Endo/Other  negative endocrine ROSdiabetes, Type 2  Renal/GU negative Renal ROS  negative genitourinary   Musculoskeletal   Abdominal   Peds  Hematology negative hematology ROS (+)   Anesthesia Other Findings   Reproductive/Obstetrics negative OB ROS                             Anesthesia Physical Anesthesia Plan  ASA: II  Anesthesia Plan: General   Post-op Pain Management:    Induction:   PONV Risk Score and Plan: 2  Airway Management Planned:   Additional Equipment:   Intra-op Plan:   Post-operative Plan:   Informed Consent: I have reviewed the patients History and Physical, chart, labs and discussed the procedure including the risks, benefits and alternatives for the proposed anesthesia with the patient or authorized representative who has indicated his/her understanding and acceptance.     Dental Advisory Given  Plan Discussed with: CRNA  Anesthesia Plan Comments:         Anesthesia Quick Evaluation

## 2019-07-28 NOTE — Clinical Social Work Note (Signed)
CSW completed [facility] Emergency Notification.  Armanie Martine, LCSW Transitions of Care Clinical Social Worker [facility] Ph: [phone] 

## 2019-07-28 NOTE — Op Note (Signed)
Patient:  Joel Jefferson.  DOB:  1975-08-17  MRN:  [id number]   Preop Diagnosis: Recurrent colon carcinoma  Postop Diagnosis: Same  Procedure: Low anterior resection  Surgeon: Aviva Signs, MD  Assistant: Curlene Labrum, MD  Anes: General endotracheal  Indications: Patient is a 44 year old white male status post a sigmoid colectomy in the past for colon carcinoma who was found recently on colonoscopy to have recurrence of the colon cancer at the anastomosis.  The patient now presents for a low anterior resection.  The risks and benefits of the procedure including bleeding, infection, cardiopulmonary difficulties, the possibility of a blood transfusion, the possibility of an anastomotic leak, the possibility of a colostomy were fully explained to the patient, who gave informed consent.  Procedure note: The patient was placed in the lithotomy position after induction of general endotracheal anesthesia.  The abdomen and perineum were prepped and draped using the usual sterile technique with ChloraPrep and Betadine.  Surgical site confirmation was performed.  A midline incision was made through the previous lower midline surgical site.  The peritoneal cavity was entered into with moderate difficulty due to adhesive disease.  A single loop of small bowel was noted to be adherent to the incision and this was freed away using sharp dissection without difficulty.  No serosal tear was noted.  The remaining descending colon was mobilized along the peritoneal reflection down to the pelvis.  We were able to identify the left ureter.  The anastomosis was palpated and a hard area was noted within it.  The peritoneum on either side of the mesentery of the colorectal region was incised.  The descending colon was then mobilized bluntly up to the splenic flexure.  A GIA 75 stapler was placed across the descending colon at the level of the pelvis and fired.  The contour TA stapler was placed across the  rectum distal to the anastomosis and fired.  The mesentery was then divided using the LigaSure.  Again, care was taken to avoid the left ureter.  The specimen was then opened in the operating room and good margins were noted around the previous anastomosis.  A suture was placed proximally for orientation purposes.  It was then elected to proceed with a #29 circular EEA stapled end-to-end anastomosis.  An auto pursestring was used to place the top portion of the stapler proximally.  The anvil was then passed transanally up to the staple line of the rectal stump.  The 2 ends were brought together and the EEA stapler was fired.  On extraction of the stapler, 2 good donuts were found.  Air was then instilled transrectally and no leak was noted.  The staple line was then bolstered using 3-0 silk sutures.  The abdominal cavity was then copiously irrigated normal saline.  All operating personnel then changed her gown and gloves.  A new set up was used for closure.  The bowel was returned into the abdominal cavity in an orderly fashion.  The fascia was reapproximated using a looped 0 PDS running suture.  Subcutaneous layer was irrigated with normal saline.  Exparel was instilled into the surrounding wound.  The skin was closed using staples.  Betadine ointment and dry sterile dressings were applied.  All tape needle counts were correct at the end of the procedure.  The patient was extubated in the operating room and transferred to PACU in stable condition.  Complications: None  EBL: 75 cc  Specimen: Colorectal anastomosis, suture proximal

## 2019-07-28 NOTE — Progress Notes (Signed)
Patient complaining of intense pain and pressure in his abdomen/bladder area. States he feels like he has to pee. He is aware of Foley being present but still feels a lot of pressure. He received robaxin in PACU at 1252. Oxycodone/Acetaminophen administered at 1452. He was very fidgety and still in a lot of pain at 1530 so Dilaudid administered. He stated that the Dilaudid is helping the pain but he is still feeling a lot of pressure in his bladder. States he has kidney stones and is wondering if that could be causing it. Dr. Arnoldo Morale notified and verbal order received to remove Foley. Foley removed at 1600. Patient reports some relief from pressure sensation.

## 2019-07-28 NOTE — Transfer of Care (Signed)
Immediate Anesthesia Transfer of Care Note  Patient: Joel Jefferson.  Procedure(s) Performed: LOW ANTERIOR BOWEL RESECTION (N/A )  Patient Location: PACU  Anesthesia Type:General  Level of Consciousness: awake, alert , oriented and patient cooperative  Airway & Oxygen Therapy: Patient Spontanous Breathing and Patient connected to nasal cannula oxygen  Post-op Assessment: Report given to RN and Post -op Vital signs reviewed and stable  Post vital signs: Reviewed and stable  Last Vitals:  Vitals Value Taken Time  BP    Temp    Pulse 96 07/28/19 1140  Resp 17 07/28/19 1140  SpO2 97 % 07/28/19 1140  Vitals shown include unvalidated device data.  Last Pain:  Vitals:   07/28/19 0732  TempSrc: Oral  PainSc: 0-No pain      Patients Stated Pain Goal: 8 ([id number])  Complications: No apparent anesthesia complications

## 2019-07-28 NOTE — Anesthesia Postprocedure Evaluation (Signed)
Anesthesia Post Note  Patient: Joel Jefferson.  Procedure(s) Performed: LOW ANTERIOR BOWEL RESECTION (N/A )  Patient location during evaluation: PACU Anesthesia Type: General Level of consciousness: awake and alert, oriented and patient cooperative Pain management: pain level not controlled Vital Signs Assessment: post-procedure vital signs reviewed and stable Respiratory status: spontaneous breathing, respiratory function stable, nonlabored ventilation and patient connected to nasal cannula oxygen Cardiovascular status: blood pressure returned to baseline and stable Postop Assessment: no apparent nausea or vomiting Anesthetic complications: no     Last Vitals:  Vitals:   07/28/19 0732  BP: 126/88  Resp: (!) 22  Temp: 36.7 C  SpO2: 99%    Last Pain:  Vitals:   07/28/19 0732  TempSrc: Oral  PainSc: 0-No pain                 Markez Dowland

## 2019-07-29 LAB — CBC
HCT: 38.1 % — ABNORMAL LOW (ref 39.0–52.0)
Hemoglobin: 12.4 g/dL — ABNORMAL LOW (ref 13.0–17.0)
MCH: 28.4 pg (ref 26.0–34.0)
MCHC: 32.5 g/dL (ref 30.0–36.0)
MCV: 87.4 fL (ref 80.0–100.0)
Platelets: 222 10*3/uL (ref 150–400)
RBC: 4.36 MIL/uL (ref 4.22–5.81)
RDW: 13.7 % (ref 11.5–15.5)
WBC: 10.4 10*3/uL (ref 4.0–10.5)
nRBC: 0 % (ref 0.0–0.2)

## 2019-07-29 LAB — BASIC METABOLIC PANEL
Anion gap: 9 (ref 5–15)
BUN: 18 mg/dL (ref 6–20)
CO2: 22 mmol/L (ref 22–32)
Calcium: 8.5 mg/dL — ABNORMAL LOW (ref 8.9–10.3)
Chloride: 105 mmol/L (ref 98–111)
Creatinine, Ser: 0.93 mg/dL (ref 0.61–1.24)
GFR calc Af Amer: >60 mL/min (ref >60)
GFR calc non Af Amer: >60 mL/min (ref >60)
Glucose, Bld: 174 mg/dL — ABNORMAL HIGH (ref 70–99)
Potassium: 3.8 mmol/L (ref 3.5–5.1)
Sodium: 136 mmol/L (ref 135–145)

## 2019-07-29 LAB — MAGNESIUM: Magnesium: 2.2 mg/dL (ref 1.7–2.4)

## 2019-07-29 LAB — GLUCOSE, CAPILLARY
Glucose-Capillary: 141 mg/dL — ABNORMAL HIGH (ref 70–99)
Glucose-Capillary: 172 mg/dL — ABNORMAL HIGH (ref 70–99)
Glucose-Capillary: 122 mg/dL — ABNORMAL HIGH (ref 70–99)
Glucose-Capillary: 124 mg/dL — ABNORMAL HIGH (ref 70–99)

## 2019-07-29 LAB — PHOSPHORUS: Phosphorus: 4.1 mg/dL (ref 2.5–4.6)

## 2019-07-29 MED ORDER — GLIPIZIDE ER 5 MG PO TB24
5.0000 mg | ORAL_TABLET | Freq: Every day | ORAL | Status: DC
  Administered 2019-07-29 – 2019-07-31 (×3): 5 mg via ORAL
  Filled 2019-07-29 (×3): qty 1

## 2019-07-29 MED ORDER — CHLORHEXIDINE GLUCONATE CLOTH 2 % EX PADS
6.0000 | MEDICATED_PAD | Freq: Every day | CUTANEOUS | Status: DC
  Administered 2019-07-29 – 2019-07-31 (×2): 6 via TOPICAL

## 2019-07-29 NOTE — Plan of Care (Signed)
  Problem: Education: Goal: Knowledge of General Education information will improve Description: Including pain rating scale, medication(s)/side effects and non-pharmacologic comfort measures Outcome: Progressing   Problem: Nutrition: Goal: Adequate nutrition will be maintained Outcome: Progressing   Problem: Skin Integrity: Goal: Demonstration of wound healing without infection will improve Outcome: Progressing

## 2019-07-29 NOTE — Progress Notes (Signed)
   07/29/19 1253  Incision (Closed) 07/28/19 Abdomen Other (Comment);Mid;Lower  Date First Assessed/Time First Assessed: 07/28/19 0933   Location: Abdomen  Location Orientation: Other (Comment);Mid;Lower  Dressing Type Honeycomb;Transparent dressing  Dressing New drainage (MD notified )  Dressing Change Frequency PRN  Site / Wound Assessment Bleeding (MD aware)  Closure Staples;Approximated  Drainage Amount Moderate  Drainage Description Sanguineous   Dr  Arnoldo Morale notified of new bleeding from surgical site. Per Dr Arnoldo Morale okay to change dressing.  New dressing obtained and applied at 1500.  1700 Patient ambulated again to the bathroom and surgical site started to ooze blood again. MD made aware again of bleeding from surgical site. 50 cent piece size drainage noted to honeycomb dressing. Per MD, keep the dressing on it and it should stop bleeding.

## 2019-07-29 NOTE — Progress Notes (Signed)
1 Day Post-Op  Subjective: Patient has moderate incisional pain.  He did not tolerate the Foley and it was removed last night.  Is able to void without difficulty.  He is passing gas.  Tolerating clear liquid diet well.  Objective: Vital Jefferson in last 24 hours: Temp:  [97.6 F (36.4 C)-98.5 F (36.9 C)] 98.3 F (36.8 C) (04/22 0400) Pulse Rate:  [98-103] 99 (04/21 1350) Resp:  [12-21] 18 (04/21 1350) BP: (121-155)/(60-94) 131/93 (04/22 0400) SpO2:  [93 %-99 %] 93 % (04/21 2015) Weight:  [111.2 kg-111.4 kg] 111.2 kg (04/22 0500) Last BM Date: 07/29/19  Intake/Output from previous day: 04/21 0701 - 04/22 0700 In: 2771.4 [P.O.:120; I.V.:2551.4; IV Piggyback:100] Out: 1340 [Urine:1265; Blood:75] Intake/Output this shift: Total I/O In: 96.6 [I.V.:96.6] Out: -   General appearance: alert, cooperative and no distress Resp: clear to auscultation bilaterally Cardio: regular rate and rhythm, S1, S2 normal, no murmur, click, rub or gallop GI: Soft, incision healing well.  Bowel sounds active.  Lab Results:  Recent Labs    07/29/19 0409  WBC 10.4  HGB 12.4*  HCT 38.1*  PLT 222   BMET Recent Labs    07/29/19 0409  NA 136  K 3.8  CL 105  CO2 22  GLUCOSE 174*  BUN 18  CREATININE 0.93  CALCIUM 8.5*   PT/INR No results for input(s): LABPROT, INR in the last 72 hours.  Studies/Results: No results found.  Anti-infectives: Anti-infectives (From admission, onward)   Start     Dose/Rate Route Frequency Ordered Stop   07/28/19 0830  cefoTEtan (CEFOTAN) 2 g in sodium chloride 0.9 % 100 mL IVPB     2 g 200 mL/hr over 30 Minutes Intravenous On call to O.R. 07/28/19 [id number] 07/28/19 0856      Assessment/Plan: s/p Procedure(s): LOW ANTERIOR BOWEL RESECTION Impression: Stable on postoperative day 1.  Patient progressing well.  Tolerating clear liquid diet well.  Will advance to full liquid diet.  Hyperglycemia being controlled with sliding scale.  Will reorder oral  hypoglycemics.  LOS: 1 day    Joel Jefferson 07/29/2019

## 2019-07-30 LAB — CBC
HCT: 34.8 % — ABNORMAL LOW (ref 39.0–52.0)
Hemoglobin: 11.3 g/dL — ABNORMAL LOW (ref 13.0–17.0)
MCH: 28.5 pg (ref 26.0–34.0)
MCHC: 32.5 g/dL (ref 30.0–36.0)
MCV: 87.7 fL (ref 80.0–100.0)
Platelets: 145 10*3/uL — ABNORMAL LOW (ref 150–400)
RBC: 3.97 MIL/uL — ABNORMAL LOW (ref 4.22–5.81)
RDW: 13.8 % (ref 11.5–15.5)
WBC: 6 10*3/uL (ref 4.0–10.5)
nRBC: 0 % (ref 0.0–0.2)

## 2019-07-30 LAB — GLUCOSE, CAPILLARY
Glucose-Capillary: 163 mg/dL — ABNORMAL HIGH (ref 70–99)
Glucose-Capillary: 202 mg/dL — ABNORMAL HIGH (ref 70–99)
Glucose-Capillary: 128 mg/dL — ABNORMAL HIGH (ref 70–99)

## 2019-07-30 LAB — MAGNESIUM: Magnesium: 2.1 mg/dL (ref 1.7–2.4)

## 2019-07-30 LAB — BASIC METABOLIC PANEL
Anion gap: 8 (ref 5–15)
BUN: 14 mg/dL (ref 6–20)
CO2: 24 mmol/L (ref 22–32)
Calcium: 8.3 mg/dL — ABNORMAL LOW (ref 8.9–10.3)
Chloride: 109 mmol/L (ref 98–111)
Creatinine, Ser: 1.01 mg/dL (ref 0.61–1.24)
GFR calc Af Amer: >60 mL/min (ref >60)
GFR calc non Af Amer: >60 mL/min (ref >60)
Glucose, Bld: 148 mg/dL — ABNORMAL HIGH (ref 70–99)
Potassium: 3.5 mmol/L (ref 3.5–5.1)
Sodium: 141 mmol/L (ref 135–145)

## 2019-07-30 LAB — PHOSPHORUS: Phosphorus: 3.1 mg/dL (ref 2.5–4.6)

## 2019-07-30 NOTE — Progress Notes (Signed)
2 Days Post-Op  Subjective: Patient has had multiple bowel movements over the past 24 hours.  He is tolerating full liquid diet well.  His abdominal pain has decreased and is well controlled with pain medication.  He denies any nausea.  Objective: Vital signs in last 24 hours: Temp:  [97.9 F (36.6 C)-98.6 F (37 C)] 98.5 F (36.9 C) (04/23 0441) Pulse Rate:  [81-96] 96 (04/23 0441) Resp:  [16-18] 16 (04/23 0441) BP: (114-128)/(71-86) 116/74 (04/23 0441) SpO2:  [97 %-100 %] 98 % (04/23 0744) Last BM Date: 07/30/19  Intake/Output from previous day: 04/22 0701 - 04/23 0700 In: 2120.7 [P.O.:360; I.V.:1760.7] Out: 100 [Urine:100] Intake/Output this shift: No intake/output data recorded.  General appearance: alert, cooperative and no distress Resp: clear to auscultation bilaterally Cardio: regular rate and rhythm, S1, S2 normal, no murmur, click, rub or gallop GI: Soft, incision healing well.  Bowel sounds active.  Lab Results:  Recent Labs    07/29/19 0409 07/30/19 0638  WBC 10.4 6.0  HGB 12.4* 11.3*  HCT 38.1* 34.8*  PLT 222 145*   BMET Recent Labs    07/29/19 0409 07/30/19 0638  NA 136 141  K 3.8 3.5  CL 105 109  CO2 22 24  GLUCOSE 174* 148*  BUN 18 14  CREATININE 0.93 1.01  CALCIUM 8.5* 8.3*   PT/INR No results for input(s): LABPROT, INR in the last 72 hours.  Studies/Results: No results found.  Anti-infectives: Anti-infectives (From admission, onward)   Start     Dose/Rate Route Frequency Ordered Stop   07/28/19 0830  cefoTEtan (CEFOTAN) 2 g in sodium chloride 0.9 % 100 mL IVPB     2 g 200 mL/hr over 30 Minutes Intravenous On call to O.R. 07/28/19 [id number] 07/28/19 0856      Assessment/Plan: s/p Procedure(s): LOW ANTERIOR BOWEL RESECTION Impression: Progressing well on postoperative day 2.  Final pathology pending.  Bowel function has returned.  Will advance to carb modified diet.  Anticipate discharge in next 24 to 48 hours.  LOS: 2 days    Aviva Signs 07/30/2019

## 2019-07-30 NOTE — Progress Notes (Signed)
Pt stated pain 6/10 has not changed since last PRN administration (see mar). Pt stated he does not want to be in pain and restless. PRN medication given (see mar). Will reassess.

## 2019-07-30 NOTE — Progress Notes (Signed)
Pt expressed concern with pain management during the night. Pt stated he did not rest well during prev night. I educated pt on PRN order and pain medication is not scheduled

## 2019-07-31 LAB — GLUCOSE, CAPILLARY: Glucose-Capillary: 277 mg/dL — ABNORMAL HIGH (ref 70–99)

## 2019-07-31 MED ORDER — OXYCODONE-ACETAMINOPHEN 7.5-325 MG PO TABS: 1.0000 | ORAL_TABLET | Freq: Four times a day (QID) | ORAL | 0 refills | Status: DC | PRN

## 2019-07-31 MED ORDER — GLIPIZIDE ER 5 MG PO TB24: ORAL_TABLET | ORAL | 3 refills | Status: AC

## 2019-07-31 NOTE — Discharge Instructions (Signed)
Open Colectomy, Care After This sheet gives you information about how to care for yourself after your procedure. Your health care provider may also give you more specific instructions. If you have problems or questions, contact your health care provider. What can I expect after the procedure? After the procedure, it is common to have:  Pain in your abdomen, especially along your incision.  Tiredness. Your energy level will return to normal over the next several weeks.  Constipation.  Nausea.  Difficulty urinating. Follow these instructions at home: Activity  You may be able to return to most of your normal activities within 1-2 weeks, such as working, walking up stairs, and sexual activity.  Avoid activities that require a lot of energy for 4-6 weeks after surgery, such as running, climbing, and lifting heavy objects. Ask your health care provider what activities are safe for you.  Take rest breaks during the day as needed.  Do not drive for 1-2 weeks or until your health care provider says that it is safe.  Do not drive or use heavy machinery while taking prescription pain medicines.  Do not lift anything that is heavier than 10 lb (4.3 kg) until your health care provider says that it is safe. Incision care   Follow instructions from your health care provider about how to take care of your incision. Make sure you: ? Wash your hands with soap and water before you change your bandage (dressing). If soap and water are not available, use hand sanitizer. ? Change your dressing as told by your health care provider. ? Leave stitches (sutures) or staples in place. These skin closures may need to stay in place for 2 weeks or longer.  Avoid wearing tight clothing around your incision.  Protect your incision area from the sun.  Check your incision area every day for signs of infection. Check for: ? More redness, swelling, or pain. ? More fluid or blood. ? Warmth. ? Pus or a bad  smell. General instructions  Do not take baths, swim, or use a hot tub until your health care provider approves. Ask your health care provider when you may shower.  Take over-the-counter and prescription medicines, including stool softeners, only as told by your health care provider.  Eat a low-fat and low-fiber diet for the first 4 weeks after surgery.  Keep all follow-up visits as told by your health care provider. This is important. Contact a health care provider if:  You have more redness, swelling, or pain around your incision.  You have more fluid or blood coming from your incision.  Your incision feels warm to the touch.  You have pus or a bad smell coming from your incision.  You have a fever or chills.  You do not have a bowel movement 2-3 days after surgery.  You cannot eat or drink for 24 hours or more.  You have persistent nausea and vomiting.  You have abdominal pain that gets worse and does not get better with medicine. Get help right away if:  You have chest pain.  You have shortness of breath.  You have pain or swelling in your legs.  Your incision breaks open after your sutures or staples have been removed.  You have bleeding from the rectum. This information is not intended to replace advice given to you by your health care provider. Make sure you discuss any questions you have with your health care provider. Document Revised: 03/07/2017 Document Reviewed: 12/25/2015 Elsevier Patient Education  2020 Elsevier   Inc.  

## 2019-07-31 NOTE — Progress Notes (Signed)
Nsg Discharge Note  Admit Date:  07/28/2019 Discharge date: 07/31/2019   Ardi Alling. to be D/C'd Home per MD order.  AVS completed.  Copy for chart, and copy for patient signed, and dated. Patient/caregiver able to verbalize understanding.  Discharge Medication: Allergies as of 07/31/2019   No Known Allergies     Medication List    STOP taking these medications   metroNIDAZOLE 500 MG tablet Commonly known as: Flagyl   neomycin 500 MG tablet Commonly known as: MYCIFRADIN     TAKE these medications   albuterol 108 (90 Base) MCG/ACT inhaler Commonly known as: VENTOLIN HFA Inhale 2 puffs into the lungs every 6 (six) hours as needed for wheezing or shortness of breath.   Contour Next Test test strip Generic drug: glucose blood USE TO CHECK BLOOD SUGAR BID   glipiZIDE 5 MG 24 hr tablet Commonly known as: GLUCOTROL XL Take two tablets daily until you see Dr. Hilma Favors What changed: See the new instructions.   losartan 100 MG tablet Commonly known as: COZAAR Take 100 mg by mouth daily.   magnesium oxide 400 MG tablet Commonly known as: MAG-OX Take 400 mg by mouth daily.   multivitamin tablet Take 1 tablet by mouth daily.   oxyCODONE-acetaminophen 7.5-325 MG tablet Commonly known as: Percocet Take 1 tablet by mouth every 6 (six) hours as needed.   pantoprazole 40 MG tablet Commonly known as: PROTONIX Take 40 mg by mouth daily.   Potassium 99 MG Tabs Take 99 mg by mouth daily.   pravastatin 20 MG tablet Commonly known as: PRAVACHOL Take 20 mg by mouth daily.   topiramate 100 MG tablet Commonly known as: TOPAMAX Take 100 mg by mouth daily.       Discharge Assessment: Vitals:   07/30/19 2132 07/31/19 0454  BP: 135/89 126/87  Pulse: 76 77  Resp:  16  Temp: 98.9 F (37.2 C) 98.1 F (36.7 C)  SpO2: 98% 99%   Skin clean, dry and intact without evidence of skin break down, no evidence of skin tears noted. IV catheter discontinued intact. Site  without signs and symptoms of complications - no redness or edema noted at insertion site, patient denies c/o pain - only slight tenderness at site.  Dressing with slight pressure applied.  D/c Instructions-Education: Discharge instructions given to patient/family with verbalized understanding. D/c education completed with patient/family including follow up instructions, medication list, d/c activities limitations if indicated, with other d/c instructions as indicated by MD - patient able to verbalize understanding, all questions fully answered. Patient instructed to return to ED, call 911, or call MD for any changes in condition.  Patient escorted via [place], and D/C home via private auto.  Zenaida Deed, RN 07/31/2019 10:26 AM

## 2019-07-31 NOTE — Discharge Summary (Signed)
Physician Discharge Summary  Patient ID: Joel Jefferson. MRN: [id number] DOB/AGE: 08/26/1975 44 y.o.  Admit date: 07/28/2019 Discharge date: 07/31/2019  Admission Diagnoses: Recurrent colon carcinoma  Discharge Diagnoses: Same Active Problems:   S/P partial colectomy   Malignant neoplasm of descending colon (HCC) Diabetes mellitus  Discharged Condition: good  Hospital Course: Patient is a 44 year old white male status post a sigmoid colectomy in the past for colon cancer who now has recurrent colon cancer at the anastomosis.  He underwent a low anterior resection on 07/28/2019.  He tolerated the surgery well.  His postoperative course has been unremarkable except for hyperglycemia.  This was controlled with sliding scale.  His diet was advanced out difficulty once his bowel function returned.  Final pathology reveals a fully excised colon cancer with 3 out of 6 lymph nodes positive.  He will be referred back to Dr. Delton Coombes of oncology for further evaluation and treatment.  The patient is being discharged home on 07/31/2019 in good and improving condition.  Treatments: surgery: Low anterior resection on 07/28/2019  Discharge Exam: Blood pressure 126/87, pulse 77, temperature 98.1 F (36.7 C), temperature source Oral, resp. rate 16, height 5\' 10"  (1.778 m), weight 111.2 kg, SpO2 99 %. General appearance: alert, cooperative and no distress Resp: clear to auscultation bilaterally Cardio: regular rate and rhythm, S1, S2 normal, no murmur, click, rub or gallop GI: Soft, incision healing well.  Bowel sounds active.  Disposition: Discharge disposition: 01-Home or Self Care       Discharge Instructions    Diet - low sodium heart healthy   Complete by: As directed    Increase activity slowly   Complete by: As directed      Allergies as of 07/31/2019   No Known Allergies     Medication List    STOP taking these medications   metroNIDAZOLE 500 MG tablet Commonly known as:  Flagyl   neomycin 500 MG tablet Commonly known as: MYCIFRADIN     TAKE these medications   albuterol 108 (90 Base) MCG/ACT inhaler Commonly known as: VENTOLIN HFA Inhale 2 puffs into the lungs every 6 (six) hours as needed for wheezing or shortness of breath.   Contour Next Test test strip Generic drug: glucose blood USE TO CHECK BLOOD SUGAR BID   glipiZIDE 5 MG 24 hr tablet Commonly known as: GLUCOTROL XL Take two tablets daily until you see Dr. Hilma Favors What changed: See the new instructions.   losartan 100 MG tablet Commonly known as: COZAAR Take 100 mg by mouth daily.   magnesium oxide 400 MG tablet Commonly known as: MAG-OX Take 400 mg by mouth daily.   multivitamin tablet Take 1 tablet by mouth daily.   oxyCODONE-acetaminophen 7.5-325 MG tablet Commonly known as: Percocet Take 1 tablet by mouth every 6 (six) hours as needed.   pantoprazole 40 MG tablet Commonly known as: PROTONIX Take 40 mg by mouth daily.   Potassium 99 MG Tabs Take 99 mg by mouth daily.   pravastatin 20 MG tablet Commonly known as: PRAVACHOL Take 20 mg by mouth daily.   topiramate 100 MG tablet Commonly known as: TOPAMAX Take 100 mg by mouth daily.      Follow-up Information    Aviva Signs, MD. Schedule an appointment as soon as possible for a visit on 08/12/2019.   Specialty: General Surgery Contact information: [id number] [id number] [phone]           Signed: Aviva Signs 07/31/2019, 9:14 AM

## 2019-08-03 ENCOUNTER — Telehealth: Payer: Self-pay

## 2019-08-03 NOTE — Telephone Encounter (Signed)
Bowel resection 07/28/19. Patient stated he passed small amount of blood with stool and wanted to know if this was normal.  Discussed with patient due to having bowel resection is normal. Continue to monitor and if blood in stool becomes consistent and lager amounts or abdominal distention then we would need to know. Denies fever or chills. Notifed Dr.Jenkins.

## 2019-08-05 ENCOUNTER — Other Ambulatory Visit: Payer: Self-pay

## 2019-08-05 ENCOUNTER — Ambulatory Visit (INDEPENDENT_AMBULATORY_CARE_PROVIDER_SITE_OTHER): Payer: Self-pay | Admitting: General Surgery

## 2019-08-05 ENCOUNTER — Encounter: Payer: Self-pay | Admitting: General Surgery

## 2019-08-05 VITALS — BP 126/86 | HR 94 | Temp 98.1°F | Resp 14 | Ht 70.0 in | Wt 234.0 lb

## 2019-08-05 DIAGNOSIS — Z09 Encounter for follow-up examination after completed treatment for conditions other than malignant neoplasm: Secondary | ICD-10-CM

## 2019-08-05 MED ORDER — OXYCODONE HCL 5 MG PO TABS: 5.0000 mg | ORAL_TABLET | ORAL | 0 refills | Status: DC | PRN

## 2019-08-05 NOTE — Progress Notes (Signed)
Subjective:     Joel Jefferson.  Patient here for postoperative visit.  Is doing well.  Is having intermittent episodes of constipation versus loose stools, though these seem to be normalizing.  Blood per rectum has decreased.  He has some tenderness along the lower aspect of his midline incision.  No drainage has been noted. Objective:    BP 126/86   Pulse 94   Temp 98.1 F (36.7 C) (Oral)   Resp 14   Ht 5\' 10"  (1.778 m)   Wt 234 lb (106.1 kg)   SpO2 94%   BMI 33.58 kg/m   General:  alert, cooperative and no distress  Abdomen soft.  Incision healing well.  Question small hematoma inferiorly.  No drainage is noted.  Staples removed, Steri-Strips applied. Final pathology already reviewed with patient     Assessment:    Doing well postoperatively.    Plan:   Will reorder oxycodone due to incisional pain.  Reassured patient that he was progressing well.  Follow-up here in 2 weeks.  He already has a scheduled appointment to see oncology later in May.

## 2019-08-06 LAB — BPAM RBC
Blood Product Expiration Date: 202105042359
Blood Product Expiration Date: 202105052359
ISSUE DATE / TIME: 202104231458
Unit Type and Rh: 1700
Unit Type and Rh: 1700

## 2019-08-06 LAB — TYPE AND SCREEN
ABO/RH(D): B POS
Antibody Screen: NEGATIVE
Unit division: 0
Unit division: 0

## 2019-08-07 ENCOUNTER — Emergency Department (HOSPITAL_COMMUNITY): Payer: No Typology Code available for payment source

## 2019-08-07 ENCOUNTER — Emergency Department (HOSPITAL_COMMUNITY): Payer: No Typology Code available for payment source | Admitting: Anesthesiology

## 2019-08-07 ENCOUNTER — Encounter (HOSPITAL_COMMUNITY): Payer: Self-pay

## 2019-08-07 ENCOUNTER — Encounter (HOSPITAL_COMMUNITY)
Admission: EM | Disposition: A | Discharge status: Home / Self Care | Payer: Self-pay | Source: Home / Self Care | Attending: Emergency Medicine

## 2019-08-07 ENCOUNTER — Other Ambulatory Visit: Payer: Self-pay

## 2019-08-07 ENCOUNTER — Ambulatory Visit (HOSPITAL_COMMUNITY)
Admission: EM | Admit: 2019-08-07 | Discharge: 2019-08-07 | Disposition: A | Discharge status: Home / Self Care | Payer: No Typology Code available for payment source | Attending: Emergency Medicine | Admitting: Emergency Medicine

## 2019-08-07 ENCOUNTER — Ambulatory Visit (HOSPITAL_COMMUNITY): Payer: No Typology Code available for payment source

## 2019-08-07 DIAGNOSIS — I10 Essential (primary) hypertension: Secondary | ICD-10-CM | POA: Diagnosis not present

## 2019-08-07 DIAGNOSIS — N200 Calculus of kidney: Secondary | ICD-10-CM

## 2019-08-07 DIAGNOSIS — Z87891 Personal history of nicotine dependence: Secondary | ICD-10-CM | POA: Insufficient documentation

## 2019-08-07 DIAGNOSIS — N201 Calculus of ureter: Secondary | ICD-10-CM

## 2019-08-07 DIAGNOSIS — E119 Type 2 diabetes mellitus without complications: Secondary | ICD-10-CM | POA: Insufficient documentation

## 2019-08-07 DIAGNOSIS — N132 Hydronephrosis with renal and ureteral calculous obstruction: Secondary | ICD-10-CM | POA: Diagnosis not present

## 2019-08-07 DIAGNOSIS — R1032 Left lower quadrant pain: Secondary | ICD-10-CM | POA: Diagnosis not present

## 2019-08-07 DIAGNOSIS — Z20822 Contact with and (suspected) exposure to covid-19: Secondary | ICD-10-CM | POA: Diagnosis not present

## 2019-08-07 DIAGNOSIS — Z87442 Personal history of urinary calculi: Secondary | ICD-10-CM | POA: Insufficient documentation

## 2019-08-07 DIAGNOSIS — Z85038 Personal history of other malignant neoplasm of large intestine: Secondary | ICD-10-CM | POA: Diagnosis not present

## 2019-08-07 HISTORY — PX: CYSTOSCOPY WITH HOLMIUM LASER LITHOTRIPSY: SHX6639

## 2019-08-07 HISTORY — PX: CYSTOSCOPY/RETROGRADE/URETEROSCOPY/STONE EXTRACTION WITH BASKET: SHX5317

## 2019-08-07 LAB — COMPREHENSIVE METABOLIC PANEL
ALT: 63 U/L — ABNORMAL HIGH (ref 0–44)
AST: 29 U/L (ref 15–41)
Albumin: 4.1 g/dL (ref 3.5–5.0)
Alkaline Phosphatase: 58 U/L (ref 38–126)
Anion gap: 8 (ref 5–15)
BUN: 22 mg/dL — ABNORMAL HIGH (ref 6–20)
CO2: 24 mmol/L (ref 22–32)
Calcium: 8.7 mg/dL — ABNORMAL LOW (ref 8.9–10.3)
Chloride: 104 mmol/L (ref 98–111)
Creatinine, Ser: 1.01 mg/dL (ref 0.61–1.24)
GFR calc Af Amer: >60 mL/min (ref >60)
GFR calc non Af Amer: >60 mL/min (ref >60)
Glucose, Bld: 246 mg/dL — ABNORMAL HIGH (ref 70–99)
Potassium: 3.2 mmol/L — ABNORMAL LOW (ref 3.5–5.1)
Sodium: 136 mmol/L (ref 135–145)
Total Bilirubin: 0.4 mg/dL (ref 0.3–1.2)
Total Protein: 7.5 g/dL (ref 6.5–8.1)

## 2019-08-07 LAB — URINALYSIS, ROUTINE W REFLEX MICROSCOPIC
Bacteria, UA: NONE SEEN
Bilirubin Urine: NEGATIVE
Glucose, UA: 50 mg/dL — AB
Ketones, ur: NEGATIVE mg/dL
Leukocytes,Ua: NEGATIVE
Nitrite: NEGATIVE
Protein, ur: NEGATIVE mg/dL
Specific Gravity, Urine: >1.046 — ABNORMAL HIGH (ref 1.005–1.030)
pH: 5 (ref 5.0–8.0)

## 2019-08-07 LAB — CBC WITH DIFFERENTIAL/PLATELET
Abs Immature Granulocytes: 0.04 10*3/uL (ref 0.00–0.07)
Basophils Absolute: 0 10*3/uL (ref 0.0–0.1)
Basophils Relative: 1 %
Eosinophils Absolute: 0.4 10*3/uL (ref 0.0–0.5)
Eosinophils Relative: 6 %
HCT: 40.8 % (ref 39.0–52.0)
Hemoglobin: 13.7 g/dL (ref 13.0–17.0)
Immature Granulocytes: 1 %
Lymphocytes Relative: 25 %
Lymphs Abs: 1.7 10*3/uL (ref 0.7–4.0)
MCH: 28.5 pg (ref 26.0–34.0)
MCHC: 33.6 g/dL (ref 30.0–36.0)
MCV: 85 fL (ref 80.0–100.0)
Monocytes Absolute: 0.4 10*3/uL (ref 0.1–1.0)
Monocytes Relative: 6 %
Neutro Abs: 4.2 10*3/uL (ref 1.7–7.7)
Neutrophils Relative %: 61 %
Platelets: 235 10*3/uL (ref 150–400)
RBC: 4.8 MIL/uL (ref 4.22–5.81)
RDW: 13 % (ref 11.5–15.5)
WBC: 6.8 10*3/uL (ref 4.0–10.5)
nRBC: 0 % (ref 0.0–0.2)

## 2019-08-07 LAB — CBG MONITORING, ED: Glucose-Capillary: 233 mg/dL — ABNORMAL HIGH (ref 70–99)

## 2019-08-07 LAB — GLUCOSE, CAPILLARY: Glucose-Capillary: 170 mg/dL — ABNORMAL HIGH (ref 70–99)

## 2019-08-07 LAB — RESPIRATORY PANEL BY RT PCR (FLU A&B, COVID)
Influenza A by PCR: NEGATIVE
Influenza B by PCR: NEGATIVE
SARS Coronavirus 2 by RT PCR: NEGATIVE

## 2019-08-07 SURGERY — CYSTOSCOPY, WITH CALCULUS REMOVAL USING BASKET
Anesthesia: General | Laterality: Left

## 2019-08-07 MED ORDER — LIDOCAINE HCL (CARDIAC) PF 100 MG/5ML IV SOSY
PREFILLED_SYRINGE | INTRAVENOUS | Status: DC | PRN
Start: 2019-08-07 — End: 2019-08-07
  Administered 2019-08-07: 100 mg via INTRATRACHEAL

## 2019-08-07 MED ORDER — MIDAZOLAM HCL 2 MG/2ML IJ SOLN
INTRAMUSCULAR | Status: AC
  Filled 2019-08-07: qty 2

## 2019-08-07 MED ORDER — ROCURONIUM BROMIDE 10 MG/ML (PF) SYRINGE
PREFILLED_SYRINGE | INTRAVENOUS | Status: DC | PRN
  Administered 2019-08-07: 40 mg via INTRAVENOUS

## 2019-08-07 MED ORDER — LACTATED RINGERS IV SOLN: Freq: Once | INTRAVENOUS | Status: DC

## 2019-08-07 MED ORDER — ROCURONIUM BROMIDE 10 MG/ML (PF) SYRINGE
PREFILLED_SYRINGE | INTRAVENOUS | Status: AC
  Filled 2019-08-07: qty 10

## 2019-08-07 MED ORDER — KETOROLAC TROMETHAMINE 30 MG/ML IJ SOLN
15.0000 mg | Freq: Once | INTRAMUSCULAR | Status: AC
  Administered 2019-08-07: 15 mg via INTRAVENOUS
  Filled 2019-08-07: qty 1

## 2019-08-07 MED ORDER — KETOROLAC TROMETHAMINE 15 MG/ML IJ SOLN
INTRAMUSCULAR | Status: AC
  Filled 2019-08-07: qty 1

## 2019-08-07 MED ORDER — HYDROMORPHONE HCL 1 MG/ML IJ SOLN: 0.2500 mg | INTRAMUSCULAR | Status: DC | PRN

## 2019-08-07 MED ORDER — PROMETHAZINE HCL 25 MG/ML IJ SOLN: 6.2500 mg | INTRAMUSCULAR | Status: DC | PRN

## 2019-08-07 MED ORDER — DEXMEDETOMIDINE HCL 200 MCG/2ML IV SOLN
INTRAVENOUS | Status: DC | PRN
  Administered 2019-08-07 (×3): 4 ug via INTRAVENOUS

## 2019-08-07 MED ORDER — MEPERIDINE HCL 50 MG/ML IJ SOLN: 6.2500 mg | INTRAMUSCULAR | Status: DC | PRN

## 2019-08-07 MED ORDER — NALOXONE HCL 0.4 MG/ML IJ SOLN
INTRAMUSCULAR | Status: DC | PRN
  Administered 2019-08-07 (×2): 40 ug via INTRAVENOUS

## 2019-08-07 MED ORDER — MORPHINE SULFATE (PF) 4 MG/ML IV SOLN
6.0000 mg | Freq: Once | INTRAVENOUS | Status: AC
  Administered 2019-08-07: 6 mg via INTRAVENOUS
  Filled 2019-08-07: qty 2

## 2019-08-07 MED ORDER — FENTANYL CITRATE (PF) 250 MCG/5ML IJ SOLN
INTRAMUSCULAR | Status: DC | PRN
  Administered 2019-08-07: 100 ug via INTRAVENOUS
  Administered 2019-08-07 (×2): 50 ug via INTRAVENOUS

## 2019-08-07 MED ORDER — TAMSULOSIN HCL 0.4 MG PO CAPS
0.4000 mg | ORAL_CAPSULE | Freq: Once | ORAL | Status: AC
  Administered 2019-08-07: 0.4 mg via ORAL
  Filled 2019-08-07: qty 1

## 2019-08-07 MED ORDER — SODIUM CHLORIDE 0.9 % IR SOLN
Status: DC | PRN
  Administered 2019-08-07: 3000 mL

## 2019-08-07 MED ORDER — IOHEXOL 300 MG/ML  SOLN
100.0000 mL | Freq: Once | INTRAMUSCULAR | Status: AC | PRN
  Administered 2019-08-07: 100 mL via INTRAVENOUS

## 2019-08-07 MED ORDER — KETOROLAC TROMETHAMINE 15 MG/ML IJ SOLN
15.0000 mg | Freq: Once | INTRAMUSCULAR | Status: AC
  Administered 2019-08-07: 15 mg via INTRAVENOUS

## 2019-08-07 MED ORDER — PROPOFOL 10 MG/ML IV BOLUS
INTRAVENOUS | Status: AC
  Filled 2019-08-07: qty 20

## 2019-08-07 MED ORDER — STERILE WATER FOR IRRIGATION IR SOLN
Status: DC | PRN
  Administered 2019-08-07: 1000 mL

## 2019-08-07 MED ORDER — CEFAZOLIN SODIUM-DEXTROSE 2-4 GM/100ML-% IV SOLN
2.0000 g | Freq: Once | INTRAVENOUS | Status: AC
  Administered 2019-08-07: 2 g via INTRAVENOUS
  Filled 2019-08-07: qty 100

## 2019-08-07 MED ORDER — PROPOFOL 10 MG/ML IV BOLUS
INTRAVENOUS | Status: DC | PRN
  Administered 2019-08-07: 200 mg via INTRAVENOUS

## 2019-08-07 MED ORDER — ACETAMINOPHEN 10 MG/ML IV SOLN
1000.0000 mg | Freq: Once | INTRAVENOUS | Status: AC
  Administered 2019-08-07: 1000 mg via INTRAVENOUS

## 2019-08-07 MED ORDER — ONDANSETRON HCL 4 MG/2ML IJ SOLN
INTRAMUSCULAR | Status: AC
  Filled 2019-08-07: qty 2

## 2019-08-07 MED ORDER — HYDROMORPHONE HCL 1 MG/ML IJ SOLN
1.0000 mg | Freq: Once | INTRAMUSCULAR | Status: AC
  Administered 2019-08-07: 03:00:00 1 mg via INTRAVENOUS
  Filled 2019-08-07: qty 1

## 2019-08-07 MED ORDER — SODIUM CHLORIDE 0.9 % IV SOLN: INTRAVENOUS | Status: DC

## 2019-08-07 MED ORDER — SUCCINYLCHOLINE CHLORIDE 200 MG/10ML IV SOSY
PREFILLED_SYRINGE | INTRAVENOUS | Status: AC
  Filled 2019-08-07: qty 10

## 2019-08-07 MED ORDER — HYDROMORPHONE HCL 1 MG/ML IJ SOLN
1.0000 mg | Freq: Once | INTRAMUSCULAR | Status: AC
  Administered 2019-08-07: 1 mg via INTRAVENOUS
  Filled 2019-08-07: qty 1

## 2019-08-07 MED ORDER — LIDOCAINE HCL URETHRAL/MUCOSAL 2 % EX GEL
CUTANEOUS | Status: AC
  Filled 2019-08-07: qty 10

## 2019-08-07 MED ORDER — ACETAMINOPHEN 10 MG/ML IV SOLN
INTRAVENOUS | Status: AC
  Filled 2019-08-07: qty 100

## 2019-08-07 MED ORDER — FENTANYL CITRATE (PF) 250 MCG/5ML IJ SOLN
INTRAMUSCULAR | Status: AC
  Filled 2019-08-07: qty 5

## 2019-08-07 MED ORDER — DIATRIZOATE MEGLUMINE 30 % UR SOLN
URETHRAL | Status: AC
  Filled 2019-08-07: qty 100

## 2019-08-07 MED ORDER — HYDROMORPHONE HCL 1 MG/ML IJ SOLN
1.0000 mg | INTRAMUSCULAR | Status: DC | PRN
  Administered 2019-08-07: 1 mg via INTRAVENOUS
  Filled 2019-08-07: qty 1

## 2019-08-07 MED ORDER — SUCCINYLCHOLINE CHLORIDE 200 MG/10ML IV SOSY
PREFILLED_SYRINGE | INTRAVENOUS | Status: DC | PRN
  Administered 2019-08-07: 120 mg via INTRAVENOUS

## 2019-08-07 MED ORDER — LACTATED RINGERS IV SOLN
INTRAVENOUS | Status: DC | PRN
Start: 2019-08-07 — End: 2019-08-07

## 2019-08-07 MED ORDER — LIDOCAINE 2% (20 MG/ML) 5 ML SYRINGE
INTRAMUSCULAR | Status: AC
  Filled 2019-08-07: qty 5

## 2019-08-07 MED ORDER — MIDAZOLAM HCL 5 MG/5ML IJ SOLN
INTRAMUSCULAR | Status: DC | PRN
  Administered 2019-08-07: 2 mg via INTRAVENOUS

## 2019-08-07 MED ORDER — METOCLOPRAMIDE HCL 5 MG/ML IJ SOLN
INTRAMUSCULAR | Status: DC | PRN
Start: 2019-08-07 — End: 2019-08-07
  Administered 2019-08-07: 10 mg via INTRAVENOUS

## 2019-08-07 MED ORDER — ONDANSETRON HCL 4 MG/2ML IJ SOLN
INTRAMUSCULAR | Status: DC | PRN
  Administered 2019-08-07: 4 mg via INTRAVENOUS

## 2019-08-07 MED ORDER — LACTATED RINGERS IV BOLUS
1000.0000 mL | Freq: Once | INTRAVENOUS | Status: AC
  Administered 2019-08-07: 1000 mL via INTRAVENOUS

## 2019-08-07 MED ORDER — SUGAMMADEX SODIUM 200 MG/2ML IV SOLN
INTRAVENOUS | Status: DC | PRN
Start: 2019-08-07 — End: 2019-08-07
  Administered 2019-08-07: 200 mg via INTRAVENOUS

## 2019-08-07 MED ORDER — METOCLOPRAMIDE HCL 5 MG/ML IJ SOLN
INTRAMUSCULAR | Status: AC
  Filled 2019-08-07: qty 2

## 2019-08-07 MED ORDER — DIATRIZOATE MEGLUMINE 30 % UR SOLN
URETHRAL | Status: DC | PRN
  Administered 2019-08-07: 50 mL via URETHRAL

## 2019-08-07 SURGICAL SUPPLY — 34 items
BAG DRAIN URO TABLE W/ADPT NS (BAG) ×3 IMPLANT
BAG DRN 8 ADPR NS SKTRN CSTL (BAG) ×2
BASKET LASER NITINOL 1.9FR (BASKET) IMPLANT
BASKET STNLS GEMINI 4WIRE 3FR (BASKET) IMPLANT
BASKET ZERO TIP NITINOL 2.4FR (BASKET) ×3 IMPLANT
BSKT STON RTRVL 120 1.9FR (BASKET)
BSKT STON RTRVL GEM 120X11 3FR (BASKET)
BSKT STON RTRVL ZERO TP 2.4FR (BASKET) ×2
CATH INTERMIT  6FR 70CM (CATHETERS) ×2 IMPLANT
CATH URET 5FR 28IN CONE TIP (BALLOONS)
CATH URET 5FR 70CM CONE TIP (BALLOONS) IMPLANT
CLOTH BEACON ORANGE TIMEOUT ST (SAFETY) ×3 IMPLANT
DECANTER SPIKE VIAL GLASS SM (MISCELLANEOUS) ×3 IMPLANT
ELECT REM PT RETURN 9FT ADLT (ELECTROSURGICAL)
ELECTRODE REM PT RTRN 9FT ADLT (ELECTROSURGICAL) IMPLANT
FIBER LASER FLEXIVA 200 (UROLOGICAL SUPPLIES) ×2 IMPLANT
GLOVE BIO SURGEON STRL SZ 6.5 (GLOVE) ×4 IMPLANT
GLOVE BIO SURGEON STRL SZ7.5 (GLOVE) ×4 IMPLANT
GLOVE BIOGEL PI IND STRL 6.5 (GLOVE) ×1 IMPLANT
GLOVE BIOGEL PI INDICATOR 6.5 (GLOVE) ×1
GOWN STRL REUS W/ TWL LRG LVL3 (GOWN DISPOSABLE) ×2 IMPLANT
GOWN STRL REUS W/ TWL XL LVL3 (GOWN DISPOSABLE) ×2 IMPLANT
GOWN STRL REUS W/TWL LRG LVL3 (GOWN DISPOSABLE) ×3
GOWN STRL REUS W/TWL XL LVL3 (GOWN DISPOSABLE) ×3
GUIDEWIRE ANG ZIPWIRE 038X150 (WIRE) IMPLANT
GUIDEWIRE STR DUAL SENSOR (WIRE) ×3 IMPLANT
IV NS IRRIG 3000ML ARTHROMATIC (IV SOLUTION) ×6 IMPLANT
KIT TURNOVER CYSTO (KITS) ×3 IMPLANT
MANIFOLD NEPTUNE II (INSTRUMENTS) ×3 IMPLANT
PAD ARMBOARD 7.5X6 YLW CONV (MISCELLANEOUS) ×3 IMPLANT
SHEATH URET ACCESS 12FR/35CM (UROLOGICAL SUPPLIES) IMPLANT
STENT URET 6FRX26 CONTOUR (STENTS) ×2 IMPLANT
SYRINGE IRR TOOMEY STRL 70CC (SYRINGE) IMPLANT
TRAY CYSTO PACK (CUSTOM PROCEDURE TRAY) ×3 IMPLANT

## 2019-08-07 NOTE — Consult Note (Signed)
Full consult to follow. 6 mm left distal stone and poorly controlled pain with NSAIDs and opiates. Will plan for cystoscopy, left retrograde pyelogram, left ureteral stent, ureteroscopy, possible laser lithotripsy and stone removal this AM.   Per [facility] - needs Covid test until we can schedule.

## 2019-08-07 NOTE — ED Provider Notes (Signed)
Emergency Department Provider Note   I have reviewed the triage vital signs and the nursing notes.   HISTORY  Chief Complaint Flank Pain   HPI Joel Jefferson. is a 44 y.o. male who presents to the emergency department today secondary to left flank pain that radiates towards his groin.  Patient states he has a history of kidney stones and this feels exactly similar to that.  States he feels nauseous as well but does not have any vomiting.  Started a couple hours ago and tried some oxycodone which did not help.  He recently had a partial large bowel obstruction secondary to some type of colon cancer.  He states his abdomen overall feels fine just the left side groin area that is bothering him.  Incision is fine.  No fevers.  He is sweating but he thinks is hot in the room and related to the pain.   No other associated or modifying symptoms.    Past Medical History:  Diagnosis Date  . Diabetes mellitus without complication ([place])   . Family history of breast cancer   . GERD (gastroesophageal reflux disease)   . History of kidney stones   . Hypercholesterolemia   . Hypertension   . Urethral stricture     Patient Active Problem List   Diagnosis Date Noted  . Malignant neoplasm of descending colon ([place])   . Colon cancer high risk   . Fatty liver 02/15/2019  . Personal history of colon cancer, stage III 02/15/2019  . Genetic testing 04/02/2018  . Family history of breast cancer   . S/P partial colectomy 03/02/2018  . Malignant neoplasm of sigmoid colon ([place])   . Rectal bleeding 02/17/2018  . LLQ pain 02/17/2018  . Abnormal computed tomography of sigmoid colon 02/17/2018  . Constipation 02/17/2018  . Dysphagia 10/13/2014  . GERD (gastroesophageal reflux disease) 10/13/2014    Past Surgical History:  Procedure Laterality Date  . BALLOON DILATION  04/06/2012   Procedure: BALLOON DILATION;  Surgeon: Reece Packer, MD;  Location: [facility];   Service: Urology;  Laterality: N/A;  . BIOPSY  02/24/2018   Procedure: BIOPSY;  Surgeon: Danie Binder, MD;  [facility];  Service: Endoscopy;;  colon  . BIOPSY  06/01/2019   Procedure: BIOPSY;  Surgeon: Danie Binder, MD;  [facility];  Service: Endoscopy;;  . BOWEL RESECTION N/A 07/28/2019   Procedure: LOW ANTERIOR BOWEL RESECTION;  Surgeon: Aviva Signs, MD;  Location: [facility];  Service: General;  Laterality: N/A;  . COLONOSCOPY WITH PROPOFOL N/A 02/24/2018   Dr. Oneida Alar: fungating, infiltrative partially obstructing mass in sigmoid colon, area tattooed. ext/int hemorrhoids. Bx: adenocarcinoma  . COLONOSCOPY WITH PROPOFOL N/A 06/01/2019   Procedure: COLONOSCOPY WITH PROPOFOL;  Surgeon: Danie Binder, MD;  [facility];  Service: Endoscopy;  Laterality: N/A;  9:30am  . ESOPHAGOGASTRODUODENOSCOPY (EGD) WITH PROPOFOL N/A 08/29/2015   Dr. Oneida Alar: possible proximal esophageal web s/p dilation, moderate gastritis, negative eosinophilic esophagitis   . ESOPHAGOGASTRODUODENOSCOPY (EGD) WITH PROPOFOL  06/01/2019   Procedure: ESOPHAGOGASTRODUODENOSCOPY (EGD) WITH PROPOFOL;  Surgeon: Danie Binder, MD;  [facility];  Service: Endoscopy;;  . PARTIAL COLECTOMY N/A 03/02/2018   Procedure: PARTIAL COLECTOMY;  Surgeon: Aviva Signs, MD;  Location: [facility];  Service: General;  Laterality: N/A;  . PORTACATH PLACEMENT Left 04/13/2018   Procedure: INSERTION PORT-A-CATH;  Surgeon: Aviva Signs, MD;  Location: [facility];  Service: General;  Laterality: Left;  .  SAVORY DILATION N/A 08/29/2015   Procedure: SAVORY DILATION;  Surgeon: Danie Binder, MD;  [facility];  Service: Endoscopy;  Laterality: N/A;  . URETHRAL DILATION    . WISDOM TOOTH EXTRACTION      Current Outpatient Rx  . Order #: [id number] Class: Historical Med  . Order #: [id number] Class: Historical Med  . Order #: [id number] Class: Normal  . Order #: [id number] Class: Historical Med  . Order #:  [id number] Class: Historical Med  . Order #: [id number] Class: Historical Med  . Order #: [id number] Class: Historical Med  . Order #: [id number] Class: Normal  . Order #: [id number] Class: Normal  . Order #: [id number] Class: Historical Med  . Order #: [id number] Class: Historical Med  . Order #: [id number] Class: Historical Med  . Order #: [id number] Class: Historical Med    Allergies Patient has no known allergies.  Family History  Problem Relation Age of Onset  . Dementia Mother   . Dementia Maternal Aunt   . Dementia Maternal Grandmother   . COPD Maternal Grandfather   . Breast cancer Maternal Aunt 42  . Breast cancer Maternal Aunt        dx in her 9s  . Lung cancer Maternal Aunt   . Healthy Son   . Colon cancer Neg Hx     Social History Social History   Tobacco Use  . Smoking status: Former Smoker    Packs/day: 0.25    Years: 9.00    Pack years: 2.25    Types: Cigarettes    Quit date: 07/27/2011    Years since quitting: 8.0  . Smokeless tobacco: Never Used  Substance Use Topics  . Alcohol use: Yes    Alcohol/week: 6.0 standard drinks    Types: 6 Cans of beer per week  . Drug use: No    Review of Systems  All other systems negative except as documented in the HPI. All pertinent positives and negatives as reviewed in the HPI. ____________________________________________   PHYSICAL EXAM:  VITAL SIGNS: ED Triage Vitals  Enc Vitals Group     BP 08/07/19 0203 (!) 134/93     Pulse Rate 08/07/19 0203 100     Resp 08/07/19 0203 18     Temp 08/07/19 0203 98.2 F (36.8 C)     Temp Source 08/07/19 0203 Oral     SpO2 08/07/19 0203 100 %     Weight 08/07/19 0202 238 lb (108 kg)     Height 08/07/19 0202 5\' 10"  (1.778 m)    Constitutional: Alert and oriented. Well appearing and in no mild distress secondary to pain. Pacing the room. Eyes: Conjunctivae are normal. PERRL. EOMI. Head: Atraumatic. Nose: No congestion/rhinnorhea. Mouth/Throat: Mucous membranes are moist.   Oropharynx non-erythematous. Neck: No stridor.  No meningeal signs.   Cardiovascular: Normal rate, regular rhythm. Good peripheral circulation. Grossly normal heart sounds.   Respiratory: Normal respiratory effort.  No retractions. Lungs CTAB. Gastrointestinal: Soft and nontender. No distention.  Musculoskeletal: No lower extremity tenderness nor edema. No gross deformities of extremities. Neurologic:  Normal speech and language. No gross focal neurologic deficits are appreciated.  Skin:  Skin is warm, diaphoretic and intact. No rash noted.  ____________________________________________   LABS (all labs ordered are listed, but only abnormal results are displayed)  Labs Reviewed  COMPREHENSIVE METABOLIC PANEL - Abnormal; Notable for the following components:      Result Value   Potassium 3.2 (*)    Glucose, Bld 246 (*)    BUN 22 (*)    Calcium  8.7 (*)    ALT 63 (*)    All other components within normal limits  URINALYSIS, ROUTINE W REFLEX MICROSCOPIC - Abnormal; Notable for the following components:   Specific Gravity, Urine >1.046 (*)    Glucose, UA 50 (*)    Hgb urine dipstick MODERATE (*)    All other components within normal limits  CBC WITH DIFFERENTIAL/PLATELET   ____________________________________________  EKG   EKG Interpretation  Date/Time:    Ventricular Rate:    PR Interval:    QRS Duration:   QT Interval:    QTC Calculation:   R Axis:     Text Interpretation:         ____________________________________________  RADIOLOGY  CT ABDOMEN PELVIS W CONTRAST  Result Date: 08/07/2019 CLINICAL DATA:  Left flank pain EXAM: CT ABDOMEN AND PELVIS WITH CONTRAST TECHNIQUE: Multidetector CT imaging of the abdomen and pelvis was performed using the standard protocol following bolus administration of intravenous contrast. CONTRAST:  162mL OMNIPAQUE IOHEXOL 300 MG/ML  SOLN COMPARISON:  None. FINDINGS: LOWER CHEST: Normal. HEPATOBILIARY: Normal hepatic contours. No intra-  or extrahepatic biliary dilatation. The gallbladder is normal. PANCREAS: Normal pancreas. No ductal dilatation or peripancreatic fluid collection. SPLEEN: Normal. ADRENALS/URINARY TRACT: The adrenal glands are normal. There is a stone within the distal left ureter measuring 6 mm, causing moderate hydroureteronephrosis and moderate perinephric stranding. The urinary bladder is normal for degree of distention STOMACH/BOWEL: There is no hiatal hernia. Normal duodenal course and caliber. No small bowel dilatation or inflammation. Status post recent colonic resection. Mild inflammatory change near the rectal anastomotic site. Soft tissue within the rectum measures 5.9 x 4.6 cm. Normal appendix. VASCULAR/LYMPHATIC: Normal course and caliber of the major abdominal vessels. No abdominal or pelvic lymphadenopathy. REPRODUCTIVE: Normal prostate size with symmetric seminal vesicles. MUSCULOSKELETAL. No bony spinal canal stenosis or focal osseous abnormality. OTHER: Ventral abdominal incision. IMPRESSION: 1. A 6 mm stone within the distal left ureter causing moderate hydroureteronephrosis and moderate perinephric stranding. 2. Masslike soft tissue within the rectum just distal to the anastomotic site. 3. Fat stranding surrounding the rectal anastomosis, likely postsurgical. Electronically Signed   By: Ulyses Jarred M.D.   On: 08/07/2019 04:14    ____________________________________________   PROCEDURES  Procedure(s) performed:   Procedures   ____________________________________________   INITIAL IMPRESSION / ASSESSMENT AND PLAN / ED COURSE  Likely kidney stone, will ct 2/2 recent colon surgery to ensure no complications from same.   CT consistent with distal 6 mm stone. No surgical complications. Pain is not able to be controlled with multiple doses of IV pain medication even after oral pain medicine at home.  Still diaphoretic and patient who not able to get comfortable.  Will consult urology for further  management.  Urology to take to OR. Patient still in quite a bit of pain, slightly better than previously.   Pertinent labs & imaging results that were available during my care of the patient were reviewed by me and considered in my medical decision making (see chart for details). ____________________________________________  FINAL CLINICAL IMPRESSION(S) / ED DIAGNOSES  Final diagnoses:  Ureterolithiasis    MEDICATIONS GIVEN DURING THIS VISIT:  Medications  HYDROmorphone (DILAUDID) injection 1 mg (1 mg Intravenous Given 08/07/19 0236)  lactated ringers bolus 1,000 mL (0 mLs Intravenous Stopped 08/07/19 0435)  iohexol (OMNIPAQUE) 300 MG/ML solution 100 mL (100 mLs Intravenous Contrast Given 08/07/19 0340)  morphine 4 MG/ML injection 6 mg (6 mg Intravenous Given 08/07/19 0334)  tamsulosin (FLOMAX) capsule 0.4  mg (0.4 mg Oral Given 08/07/19 0527)  ketorolac (TORADOL) 30 MG/ML injection 15 mg (15 mg Intravenous Given 08/07/19 0527)  HYDROmorphone (DILAUDID) injection 1 mg (1 mg Intravenous Given 08/07/19 0527)     NEW OUTPATIENT MEDICATIONS STARTED DURING THIS VISIT:  New Prescriptions   No medications on file    Note:  This note was prepared with assistance of Dragon voice recognition software. Occasional wrong-word or sound-a-like substitutions may have occurred due to the inherent limitations of voice recognition software.   Asra Gambrel, Corene Cornea, MD 08/07/19 [phone]

## 2019-08-07 NOTE — Progress Notes (Signed)
Per new Standard of Work guidelines OR team called after Negative covid results were documented in chart. AC contacted Dr. Junious Silk following negative results to set time for procedure. Procedure to be done at 9am per Dr. Lyndal Rainbow request and OR team notified by [facility] between [phone].

## 2019-08-07 NOTE — Anesthesia Postprocedure Evaluation (Signed)
Anesthesia Post Note  Patient: Joel Jefferson.  Procedure(s) Performed: CYSTOSCOPY/RETROGRADE/URETEROSCOPY/STONE EXTRACTION WITH BASKET (Left ) CYSTOSCOPY WITH HOLMIUM LASER LITHOTRIPSY  Patient location during evaluation: Phase II Anesthesia Type: General Level of consciousness: awake and alert and oriented Pain management: pain level controlled Vital Signs Assessment: post-procedure vital signs reviewed and stable Respiratory status: spontaneous breathing and respiratory function stable Cardiovascular status: blood pressure returned to baseline and stable Postop Assessment: no apparent nausea or vomiting and adequate PO intake Anesthetic complications: no     Last Vitals:  Vitals:   08/07/19 1146 08/07/19 1200  BP: (!) 148/93 130/90  Pulse: (!) 111 92  Resp: 18 13  Temp:    SpO2: 98% 95%    Last Pain:  Vitals:   08/07/19 1200  TempSrc:   PainSc: 3                  Remigio Mcmillon C Euclide Granito

## 2019-08-07 NOTE — Anesthesia Procedure Notes (Signed)
Procedure Name: Intubation Date/Time: 08/07/2019 10:18 AM Performed by: Denese Killings, MD Pre-anesthesia Checklist: Patient identified, Emergency Drugs available, Suction available and Patient being monitored Patient Re-evaluated:Patient Re-evaluated prior to induction Oxygen Delivery Method: Circle system utilized Preoxygenation: Pre-oxygenation with 100% oxygen Induction Type: IV induction Laryngoscope Size: Mac and 3 Tube type: Oral Tube size: 7.0 mm Number of attempts: 1 Airway Equipment and Method: Stylet and Oral airway Placement Confirmation: ETT inserted through vocal cords under direct vision,  positive ETCO2 and breath sounds checked- equal and bilateral Tube secured with: Tape Dental Injury: Teeth and Oropharynx as per pre-operative assessment

## 2019-08-07 NOTE — Anesthesia Preprocedure Evaluation (Signed)
Anesthesia Evaluation  Patient identified by MRN, date of birth, ID band Patient awake    Reviewed: Allergy & Precautions, H&P , NPO status , Patient's Chart, lab work & pertinent test results, reviewed documented beta blocker date and time   History of Anesthesia Complications Negative for: history of anesthetic complications  Airway Mallampati: II  TM Distance: >3 FB Neck ROM: full    Dental  (+) Poor Dentition, Dental Advisory Given, Missing   Pulmonary neg pulmonary ROS, former smoker,    Pulmonary exam normal breath sounds clear to auscultation       Cardiovascular Exercise Tolerance: Good hypertension, Pt. on medications  Rhythm:regular Rate:Normal     Neuro/Psych negative neurological ROS  negative psych ROS   GI/Hepatic GERD  Medicated and Controlled,Elevated LFTs   Endo/Other  negative endocrine ROSdiabetes, Well Controlled, Type 2  Renal/GU Renal disease (left ureteral stone, left hydronephrosis)  negative genitourinary   Musculoskeletal negative musculoskeletal ROS (+)   Abdominal   Peds  Hematology negative hematology ROS (+)   Anesthesia Other Findings   Reproductive/Obstetrics negative OB ROS                             Anesthesia Physical  Anesthesia Plan  ASA: II and emergent  Anesthesia Plan: General   Post-op Pain Management:    Induction: Intravenous  PONV Risk Score and Plan: 4 or greater and Ondansetron and Midazolam  Airway Management Planned: Oral ETT  Additional Equipment:   Intra-op Plan:   Post-operative Plan: Extubation in OR  Informed Consent: I have reviewed the patients History and Physical, chart, labs and discussed the procedure including the risks, benefits and alternatives for the proposed anesthesia with the patient or authorized representative who has indicated his/her understanding and acceptance.     Dental advisory given  Plan  Discussed with: CRNA and Surgeon  Anesthesia Plan Comments:         Anesthesia Quick Evaluation

## 2019-08-07 NOTE — Op Note (Signed)
Preoperative diagnosis: Left distal ureteral stone, left hydronephrosis   Postoperative diagnosis: Same  Procedure: Left ureteroscopy, laser lithotripsy, stone basket extraction, left ureteral stent placement  Surgeon: Junious Silk  Anesthesia: General  Indication for procedure: Joel Jefferson is a 44 year old male with uncontrolled pain from the left distal stone.  Of note he has a history of BX and his wife does CIC to keep that meatus open.  She does a good job with that.  She also notes he voids frequently.  Findings: On cystoscopy the meatus and fossa navicularis were small but accommodated the 22 French rigid cystoscope.  Otherwise urethra unremarkable.  Interestingly, he had a tight bladder neck and the bladder and ureters were somewhat fixed similar to pelvic radiation.  It may be postop changes from his recent colon surgery.  I was however able to negotiate the ureteroscope into the left distal ureter where the stone was obstructed at the left UVJ.  I was able to fragment the stone and drop all the pieces sequentially into the bladder.  I did not need to shoot a retrograde pyelogram as he had a standing antegrade nephrogram with the parenchyma visible, collecting system was visible and ureter was visible all the way down to the UVJ and the stone with moderate hydroureteronephrosis.  After wire placement the parenchyma quickly disappeared as contrast made its way into the collecting system and then drained.  Wire was visualized in the proper lumen of the ureter with the ureteroscope.  Description of procedure: After consent was obtained patient brought to the operating room.  After adequate anesthesia he was placed in lithotomy position and prepped and draped in the usual sterile fashion.  A timeout was performed to confirm the patient and procedure.  The cystoscope was passed per urethra.  Interestingly had sort of a high tight fixed bladder neck.  Ureteral orifices were sort of more lateral than  normal.  I could see the stone in the standing nephrogram and ureterogram on scout imaging.  A sensor wire was passed but would not pass the stone.  I had to brace it with a 6 [place] open-ended catheter and then the sensor wire advanced without difficulty.  I then drained the bladder and took the cystoscope out.  A semirigid was passed adjacent to the wire was slow steady progress I was able to make it to the stone.  It was then broken up at 0.8 and 8 with a 200 m holmium laser fiber.  I then took a 0 tip basket and dropped fragment sequentially into the bladder.  Inspection up toward the iliacs noted there to be no other stone fragments.  There was some edema and inflammation from the stone impaction and the ureteroscope passage.  I backloaded the wire on the cystoscope and a 6 x 26 cm stent advanced.  The wire was removed with a good coil seen in the kidney and a good coil in the bladder.  With pulling the pusher out the string got snagged and I pulled the stent and therefore the cystoscope was backed out and the stent came out through the meatus.  I repassed the wire into the collecting system which still had some contrast in it and backloaded it again on the cystoscope.  The stent was repassed and 626 placed.  Good coil seen in the kidney good coil in the bladder.  Bladder drained and the scope removed.  I stated I left a string on the stent.  As his wife does CIC she  says she want to have any trouble pulling the stent.  He was awakened taken recovery room in stable condition.  I did put some lidocaine jelly per urethra.  Complication: None  Blood loss: Minimal  Specimens: None.  I irrigated the stone fragments out of the bladder but there were any significant stone fragments to send.  Drains: 6 x 26 cm left ureteral stent with string   Disposition: Patient stable to PACU

## 2019-08-07 NOTE — Discharge Instructions (Signed)
Ureteral Stent Implantation, Care After This sheet gives you information about how to care for yourself after your procedure. Your health care provider may also give you more specific instructions. If you have problems or questions, contact your health care provider.  Remove the stent by pulling the string as instructed, on Tuesday morning, Aug 10, 2019   What can I expect after the procedure? After the procedure, it is common to have:  Nausea.  Mild pain when you urinate. You may feel this pain in your lower back or lower abdomen. The pain should stop within a few minutes after you urinate. This may last for up to 1 week.  A small amount of blood in your urine for several days. Follow these instructions at home: Medicines  Take over-the-counter and prescription medicines only as told by your health care provider.  If you were prescribed an antibiotic medicine, take it as told by your health care provider. Do not stop taking the antibiotic even if you start to feel better.  Do not drive for 24 hours if you were given a sedative during your procedure.  Ask your health care provider if the medicine prescribed to you requires you to avoid driving or using heavy machinery. Activity  Rest as told by your health care provider.  Avoid sitting for a long time without moving. Get up to take short walks every 1-2 hours. This is important to improve blood flow and breathing. Ask for help if you feel weak or unsteady.  Return to your normal activities as told by your health care provider. Ask your health care provider what activities are safe for you. General instructions   Watch for any blood in your urine. Call your health care provider if the amount of blood in your urine increases.  If you have a catheter: ? Follow instructions from your health care provider about taking care of your catheter and collection bag. ? Do not take baths, swim, or use a hot tub until your health care provider  approves. Ask your health care provider if you may take showers. You may only be allowed to take sponge baths.  Drink enough fluid to keep your urine pale yellow.  Do not use any products that contain nicotine or tobacco, such as cigarettes, e-cigarettes, and chewing tobacco. These can delay healing after surgery. If you need help quitting, ask your health care provider.  Keep all follow-up visits as told by your health care provider. This is important. Contact a health care provider if:  You have pain that gets worse or does not get better with medicine, especially pain when you urinate.  You have difficulty urinating.  You feel nauseous or you vomit repeatedly during a period of more than 2 days after the procedure. Get help right away if:  Your urine is dark red or has blood clots in it.  You are leaking urine (have incontinence).  The end of the stent comes out of your urethra.  You cannot urinate.  You have sudden, sharp, or severe pain in your abdomen or lower back.  You have a fever.  You have swelling or pain in your legs.  You have difficulty breathing. Summary  After the procedure, it is common to have mild pain when you urinate that goes away within a few minutes after you urinate. This may last for up to 1 week.  Watch for any blood in your urine. Call your health care provider if the amount of blood in your  urine increases.  Take over-the-counter and prescription medicines only as told by your health care provider.  Drink enough fluid to keep your urine pale yellow. This information is not intended to replace advice given to you by your health care provider. Make sure you discuss any questions you have with your health care provider. Document Revised: 12/30/2017 Document Reviewed: 12/31/2017 Elsevier Patient Education  2020 [employer].

## 2019-08-07 NOTE — ED Triage Notes (Signed)
History of kidney stones, reports left flank pain that started a couple of hours ago.  Nausea, no vomiting.

## 2019-08-07 NOTE — Transfer of Care (Signed)
Immediate Anesthesia Transfer of Care Note  Patient: Joel Jefferson.  Procedure(s) Performed: CYSTOSCOPY/RETROGRADE/URETEROSCOPY/STONE EXTRACTION WITH BASKET (Left ) CYSTOSCOPY WITH HOLMIUM LASER LITHOTRIPSY  Patient Location: PACU  Anesthesia Type:General  Level of Consciousness: awake, alert , oriented and sedated  Airway & Oxygen Therapy: Patient Spontanous Breathing  Post-op Assessment: Report given to RN and Post -op Vital signs reviewed and stable  Post vital signs: Reviewed and stable  Last Vitals:  Vitals Value Taken Time  BP 150/96 08/07/19 1138  Temp    Pulse 103 08/07/19 1140  Resp 13 08/07/19 1140  SpO2 98 % 08/07/19 1140  Vitals shown include unvalidated device data.  Last Pain:  Vitals:   08/07/19 0911  TempSrc: Oral  PainSc:          Complications: No apparent anesthesia complications

## 2019-08-07 NOTE — H&P (Signed)
H&P  Chief Complaint:  Left ureteral stone  History of Present Illness: Joel Jefferson is a 44 year old white male who developed sudden onset of left flank pain last night about midnight.  He has had uncontrolled pain all morning with Dilaudid and Toradol.  CT scan of the abdomen and pelvis was obtained which showed a 6 mm left distal stone.  He was brought for cystoscopy with left ureteral stent, left ureteroscopy possible laser lithotripsy and stone removal.  He is well without dysuria or fever.  No gross hematuria.  Of note he has a history of BXO  and follows with Dr. Vikki Ports. He had had colon cancer surgery last week and a Foley catheter was able to be placed and removed, although patient does report a weak stream.  Past Medical History:  Diagnosis Date  . Diabetes mellitus without complication ([place])   . Family history of breast cancer   . GERD (gastroesophageal reflux disease)   . History of kidney stones   . Hypercholesterolemia   . Hypertension   . Urethral stricture    Past Surgical History:  Procedure Laterality Date  . BALLOON DILATION  04/06/2012   Procedure: BALLOON DILATION;  Surgeon: Reece Packer, MD;  Location: [facility];  Service: Urology;  Laterality: N/A;  . BIOPSY  02/24/2018   Procedure: BIOPSY;  Surgeon: Danie Binder, MD;  [facility];  Service: Endoscopy;;  colon  . BIOPSY  06/01/2019   Procedure: BIOPSY;  Surgeon: Danie Binder, MD;  [facility];  Service: Endoscopy;;  . BOWEL RESECTION N/A 07/28/2019   Procedure: LOW ANTERIOR BOWEL RESECTION;  Surgeon: Aviva Signs, MD;  Location: [facility];  Service: General;  Laterality: N/A;  . COLONOSCOPY WITH PROPOFOL N/A 02/24/2018   Dr. Oneida Alar: fungating, infiltrative partially obstructing mass in sigmoid colon, area tattooed. ext/int hemorrhoids. Bx: adenocarcinoma  . COLONOSCOPY WITH PROPOFOL N/A 06/01/2019   Procedure: COLONOSCOPY WITH PROPOFOL;  Surgeon: Danie Binder, MD;   [facility];  Service: Endoscopy;  Laterality: N/A;  9:30am  . ESOPHAGOGASTRODUODENOSCOPY (EGD) WITH PROPOFOL N/A 08/29/2015   Dr. Oneida Alar: possible proximal esophageal web s/p dilation, moderate gastritis, negative eosinophilic esophagitis   . ESOPHAGOGASTRODUODENOSCOPY (EGD) WITH PROPOFOL  06/01/2019   Procedure: ESOPHAGOGASTRODUODENOSCOPY (EGD) WITH PROPOFOL;  Surgeon: Danie Binder, MD;  [facility];  Service: Endoscopy;;  . PARTIAL COLECTOMY N/A 03/02/2018   Procedure: PARTIAL COLECTOMY;  Surgeon: Aviva Signs, MD;  Location[facility];  Service: General;  Laterality: N/A;  . PORTACATH PLACEMENT Left 04/13/2018   Procedure: INSERTION PORT-A-CATH;  Surgeon: Aviva Signs, MD;  Location[facility];  Service: General;  Laterality: Left;  . SAVORY DILATION N/A 08/29/2015   Procedure: SAVORY DILATION;  Surgeon: Danie Binder, MD;  [facility];  Service: Endoscopy;  Laterality: N/A;  . URETHRAL DILATION    . WISDOM TOOTH EXTRACTION      Home Medications:  (Not in a hospital admission)  Allergies: No Known Allergies  Family History  Problem Relation Age of Onset  . Dementia Mother   . Dementia Maternal Aunt   . Dementia Maternal Grandmother   . COPD Maternal Grandfather   . Breast cancer Maternal Aunt 42  . Breast cancer Maternal Aunt        dx in her 22s  . Lung cancer Maternal Aunt   . Healthy Son   . Colon cancer Neg Hx    Social History:  reports that he  quit smoking about 8 years ago. His smoking use included cigarettes. He has a 2.25 pack-year smoking history. He has never used smokeless tobacco. He reports current alcohol use of about 6.0 standard drinks of alcohol per week. He reports that he does not use drugs.  ROS: A complete review of systems was performed.  All systems are negative except for pertinent findings as noted. Review of Systems  All other systems reviewed and are negative.    Physical Exam:  Vital signs in last 24  hours: Temp:  [97.8 F (36.6 C)-98.2 F (36.8 C)] 97.8 F (36.6 C) (05/01 0911) Pulse Rate:  [94-118] 101 (05/01 0911) Resp:  [16-18] 18 (05/01 0911) BP: (130-178)/(84-105) 130/90 (05/01 0911) SpO2:  [94 %-100 %] 97 % (05/01 0911) Weight:  NG:[id number] kg] 108 kg (05/01 0202) General:  Alert and oriented, No acute distress HEENT: Normocephalic, atraumatic Neck: No JVD or lymphadenopathy Cardiovascular: Regular rate and rhythm Lungs: Regular rate and effort Abdomen: Soft, nontender, nondistended, no abdominal masses Back: No CVA tenderness Extremities: No edema Neurologic: Grossly intact  Laboratory Data:  Results for orders placed or performed during the hospital encounter of 08/07/19 (from the past 24 hour(s))  CBC with Differential     Status: None   Collection Time: 08/07/19  2:26 AM  Result Value Ref Range   WBC 6.8 4.0 - 10.5 K/uL   RBC 4.80 4.22 - 5.81 MIL/uL   Hemoglobin 13.7 13.0 - 17.0 g/dL   HCT 40.8 39.0 - 52.0 %   MCV 85.0 80.0 - 100.0 fL   MCH 28.5 26.0 - 34.0 pg   MCHC 33.6 30.0 - 36.0 g/dL   RDW 13.0 11.5 - 15.5 %   Platelets 235 150 - 400 K/uL   nRBC 0.0 0.0 - 0.2 %   Neutrophils Relative % 61 %   Neutro Abs 4.2 1.7 - 7.7 K/uL   Lymphocytes Relative 25 %   Lymphs Abs 1.7 0.7 - 4.0 K/uL   Monocytes Relative 6 %   Monocytes Absolute 0.4 0.1 - 1.0 K/uL   Eosinophils Relative 6 %   Eosinophils Absolute 0.4 0.0 - 0.5 K/uL   Basophils Relative 1 %   Basophils Absolute 0.0 0.0 - 0.1 K/uL   Immature Granulocytes 1 %   Abs Immature Granulocytes 0.04 0.00 - 0.07 K/uL  Comprehensive metabolic panel     Status: Abnormal   Collection Time: 08/07/19  2:26 AM  Result Value Ref Range   Sodium 136 135 - 145 mmol/L   Potassium 3.2 (L) 3.5 - 5.1 mmol/L   Chloride 104 98 - 111 mmol/L   CO2 24 22 - 32 mmol/L   Glucose, Bld 246 (H) 70 - 99 mg/dL   BUN 22 (H) 6 - 20 mg/dL   Creatinine, Ser 1.01 0.61 - 1.24 mg/dL   Calcium 8.7 (L) 8.9 - 10.3 mg/dL   Total Protein 7.5 6.5 -  8.1 g/dL   Albumin 4.1 3.5 - 5.0 g/dL   AST 29 15 - 41 U/L   ALT 63 (H) 0 - 44 U/L   Alkaline Phosphatase 58 38 - 126 U/L   Total Bilirubin 0.4 0.3 - 1.2 mg/dL   GFR calc non Af Amer >60 >60 mL/min   GFR calc Af Amer >60 >60 mL/min   Anion gap 8 5 - 15  Urinalysis, Routine w reflex microscopic     Status: Abnormal   Collection Time: 08/07/19  4:27 AM  Result Value Ref Range   Color,  Urine YELLOW YELLOW   APPearance CLEAR CLEAR   Specific Gravity, Urine >1.046 (H) 1.005 - 1.030   pH 5.0 5.0 - 8.0   Glucose, UA 50 (A) NEGATIVE mg/dL   Hgb urine dipstick MODERATE (A) NEGATIVE   Bilirubin Urine NEGATIVE NEGATIVE   Ketones, ur NEGATIVE NEGATIVE mg/dL   Protein, ur NEGATIVE NEGATIVE mg/dL   Nitrite NEGATIVE NEGATIVE   Leukocytes,Ua NEGATIVE NEGATIVE   RBC / HPF 6-10 0 - 5 RBC/hpf   WBC, UA 0-5 0 - 5 WBC/hpf   Bacteria, UA NONE SEEN NONE SEEN   Mucus PRESENT    Ca Oxalate Crys, UA PRESENT   Respiratory Panel by RT PCR (Flu A&B, Covid) - Nasopharyngeal Swab     Status: None   Collection Time: 08/07/19  6:35 AM   Specimen: Nasopharyngeal Swab  Result Value Ref Range   SARS Coronavirus 2 by RT PCR NEGATIVE NEGATIVE   Influenza A by PCR NEGATIVE NEGATIVE   Influenza B by PCR NEGATIVE NEGATIVE  CBG monitoring, ED     Status: Abnormal   Collection Time: 08/07/19  9:09 AM  Result Value Ref Range   Glucose-Capillary 233 (H) 70 - 99 mg/dL   Recent Results (from the past 240 hour(s))  MRSA PCR Screening     Status: None   Collection Time: 07/28/19  2:51 PM   Specimen: Nasopharyngeal  Result Value Ref Range Status   MRSA by PCR NEGATIVE NEGATIVE Final    Comment:        The GeneXpert MRSA Assay (FDA approved for NASAL specimens only), is one component of a comprehensive MRSA colonization surveillance program. It is not intended to diagnose MRSA infection nor to guide or monitor treatment for MRSA infections. Performed at [facility], [address] [place], [place] [address]    Respiratory Panel by RT PCR (Flu A&B, Covid) - Nasopharyngeal Swab     Status: None   Collection Time: 08/07/19  6:35 AM   Specimen: Nasopharyngeal Swab  Result Value Ref Range Status   SARS Coronavirus 2 by RT PCR NEGATIVE NEGATIVE Final    Comment: (NOTE) SARS-CoV-2 target nucleic acids are NOT DETECTED. The SARS-CoV-2 RNA is generally detectable in upper respiratoy specimens during the acute phase of infection. The lowest concentration of SARS-CoV-2 viral copies this assay can detect is 131 copies/mL. A negative result does not preclude SARS-Cov-2 infection and should not be used as the sole basis for treatment or other patient management decisions. A negative result may occur with  improper specimen collection/handling, submission of specimen other than nasopharyngeal swab, presence of viral mutation(s) within the areas targeted by this assay, and inadequate number of viral copies (<131 copies/mL). A negative result must be combined with clinical observations, patient history, and epidemiological information. The expected result is Negative. Fact Sheet for Patients:  PinkCheek.be Fact Sheet for Healthcare Providers:  GravelBags.it This test is not yet ap proved or cleared by the [place] FDA and  has been authorized for detection and/or diagnosis of SARS-CoV-2 by FDA under an Emergency Use Authorization (EUA). This EUA will remain  in effect (meaning this test can be used) for the duration of the COVID-19 declaration under Section 564(b)(1) of the Act, 21 U.S.C. section 360bbb-3(b)(1), unless the authorization is terminated or revoked sooner.    Influenza A by PCR NEGATIVE NEGATIVE Final   Influenza B by PCR NEGATIVE NEGATIVE Final    Comment: (NOTE) The Xpert Xpress SARS-CoV-2/FLU/RSV assay is intended as an aid in  the diagnosis of influenza from Nasopharyngeal swab specimens and  should not be used as a sole  basis for treatment. Nasal washings and  aspirates are unacceptable for Xpert Xpress SARS-CoV-2/FLU/RSV  testing. Fact Sheet for Patients: PinkCheek.be Fact Sheet for Healthcare Providers: GravelBags.it This test is not yet approved or cleared by the [place] FDA and  has been authorized for detection and/or diagnosis of SARS-CoV-2 by  FDA under an Emergency Use Authorization (EUA). This EUA will remain  in effect (meaning this test can be used) for the duration of the  Covid-19 declaration under Section 564(b)(1) of the Act, 21  U.S.C. section 360bbb-3(b)(1), unless the authorization is  terminated or revoked. Performed at [facility], [address] [place], [place] [address]    Creatinine: Recent Labs    08/07/19 0226  CREATININE 1.01    Impression/Assessment/plan:  I discussed with the patient the nature, potential benefits, risks and alternatives to  left ureteral stent, left ureteroscopy possible laser lithotripsy and stone removal.  Possible urethral dilation and Foley catheter placement, including side effects of the proposed treatment, the likelihood of the patient achieving the goals of the procedure, and any potential problems that might occur during the procedure or recuperation. All questions answered. Patient elects to proceed.  Discussed stent postoperatively and that he may need a staged procedure.  Also discussed all of these details with his wife.   Festus Aloe 08/07/2019, 10:01 AM

## 2019-08-16 ENCOUNTER — Other Ambulatory Visit (HOSPITAL_COMMUNITY): Payer: PRIVATE HEALTH INSURANCE

## 2019-08-17 ENCOUNTER — Other Ambulatory Visit (HOSPITAL_COMMUNITY): Payer: PRIVATE HEALTH INSURANCE

## 2019-08-17 ENCOUNTER — Telehealth: Payer: Self-pay

## 2019-08-17 ENCOUNTER — Ambulatory Visit: Payer: PRIVATE HEALTH INSURANCE | Admitting: Gastroenterology

## 2019-08-17 NOTE — Telephone Encounter (Signed)
Faxed last office notes and Op note to Adria Dill [phone].

## 2019-08-19 ENCOUNTER — Encounter: Payer: Self-pay | Admitting: General Surgery

## 2019-08-19 ENCOUNTER — Other Ambulatory Visit: Payer: Self-pay

## 2019-08-19 ENCOUNTER — Ambulatory Visit (INDEPENDENT_AMBULATORY_CARE_PROVIDER_SITE_OTHER): Payer: Self-pay | Admitting: General Surgery

## 2019-08-19 VITALS — BP 129/86 | HR 90 | Temp 98.5°F | Ht 70.0 in | Wt 234.0 lb

## 2019-08-19 DIAGNOSIS — Z09 Encounter for follow-up examination after completed treatment for conditions other than malignant neoplasm: Secondary | ICD-10-CM

## 2019-08-19 NOTE — Progress Notes (Signed)
Subjective:     Joel Jefferson.  Doing well from surgery standpoint.  Recently had kidney stone removed by urology.  He states he is much better. Objective:    BP 129/86   Pulse 90   Temp 98.5 F (36.9 C)   Ht 5\' 10"  (1.778 m)   Wt 234 lb (106.1 kg)   SpO2 97%   BMI 33.58 kg/m   General:  alert, cooperative and no distress  Abdomen soft, incision healing well.     Assessment:    Doing well postoperatively.    Plan:   To see oncology on 08/30/2019.  Follow-up here as needed.

## 2019-08-23 ENCOUNTER — Other Ambulatory Visit: Payer: Self-pay

## 2019-08-23 ENCOUNTER — Inpatient Hospital Stay (HOSPITAL_COMMUNITY): Payer: No Typology Code available for payment source | Attending: Hematology

## 2019-08-23 ENCOUNTER — Encounter (HOSPITAL_COMMUNITY): Payer: Self-pay

## 2019-08-23 DIAGNOSIS — C187 Malignant neoplasm of sigmoid colon: Secondary | ICD-10-CM | POA: Diagnosis present

## 2019-08-23 DIAGNOSIS — E1142 Type 2 diabetes mellitus with diabetic polyneuropathy: Secondary | ICD-10-CM | POA: Diagnosis not present

## 2019-08-23 DIAGNOSIS — R7401 Elevation of levels of liver transaminase levels: Secondary | ICD-10-CM | POA: Diagnosis not present

## 2019-08-23 LAB — COMPREHENSIVE METABOLIC PANEL
ALT: 49 U/L — ABNORMAL HIGH (ref 0–44)
AST: 27 U/L (ref 15–41)
Albumin: 4.2 g/dL (ref 3.5–5.0)
Alkaline Phosphatase: 44 U/L (ref 38–126)
Anion gap: 8 (ref 5–15)
BUN: 16 mg/dL (ref 6–20)
CO2: 23 mmol/L (ref 22–32)
Calcium: 8.8 mg/dL — ABNORMAL LOW (ref 8.9–10.3)
Chloride: 108 mmol/L (ref 98–111)
Creatinine, Ser: 0.83 mg/dL (ref 0.61–1.24)
GFR calc Af Amer: >60 mL/min (ref >60)
GFR calc non Af Amer: >60 mL/min (ref >60)
Glucose, Bld: 125 mg/dL — ABNORMAL HIGH (ref 70–99)
Potassium: 3.8 mmol/L (ref 3.5–5.1)
Sodium: 139 mmol/L (ref 135–145)
Total Bilirubin: 0.6 mg/dL (ref 0.3–1.2)
Total Protein: 6.6 g/dL (ref 6.5–8.1)

## 2019-08-23 LAB — CBC WITH DIFFERENTIAL/PLATELET
Abs Immature Granulocytes: 0.01 10*3/uL (ref 0.00–0.07)
Basophils Absolute: 0 10*3/uL (ref 0.0–0.1)
Basophils Relative: 1 %
Eosinophils Absolute: 0.3 10*3/uL (ref 0.0–0.5)
Eosinophils Relative: 5 %
HCT: 40.8 % (ref 39.0–52.0)
Hemoglobin: 13.4 g/dL (ref 13.0–17.0)
Immature Granulocytes: 0 %
Lymphocytes Relative: 30 %
Lymphs Abs: 1.8 10*3/uL (ref 0.7–4.0)
MCH: 27.7 pg (ref 26.0–34.0)
MCHC: 32.8 g/dL (ref 30.0–36.0)
MCV: 84.5 fL (ref 80.0–100.0)
Monocytes Absolute: 0.3 10*3/uL (ref 0.1–1.0)
Monocytes Relative: 5 %
Neutro Abs: 3.5 10*3/uL (ref 1.7–7.7)
Neutrophils Relative %: 59 %
Platelets: 201 10*3/uL (ref 150–400)
RBC: 4.83 MIL/uL (ref 4.22–5.81)
RDW: 13.1 % (ref 11.5–15.5)
WBC: 6 10*3/uL (ref 4.0–10.5)
nRBC: 0 % (ref 0.0–0.2)

## 2019-08-23 MED ORDER — HEPARIN SOD (PORK) LOCK FLUSH 100 UNIT/ML IV SOLN
500.0000 [IU] | Freq: Once | INTRAVENOUS | Status: AC
  Administered 2019-08-23: 500 [IU] via INTRAVENOUS

## 2019-08-23 MED ORDER — SODIUM CHLORIDE 0.9% FLUSH
20.0000 mL | INTRAVENOUS | Status: DC | PRN
  Administered 2019-08-23: 20 mL via INTRAVENOUS

## 2019-08-23 NOTE — Progress Notes (Signed)
Joel Jefferson. tolerated port lab draw well without complaints or incident. Port accessed with 20 gauge needle with blood drawn for labs ordered then flushed easily per protocol and de-accessed. VSS Pt discharged self ambulatory in satisfactory condition

## 2019-08-23 NOTE — Patient Instructions (Signed)
[facility] at [facility] Discharge Instructions  Labs drawn from portacath today. Follow-up as scheduled. Call clinic for any questions or concerns   Thank you for choosing [facility] at [facility] to provide your oncology and hematology care.  To afford each patient quality time with our provider, please arrive at least 15 minutes before your scheduled appointment time.   If you have a lab appointment with the Cancer Center please come in thru the Main Entrance and check in at the main information desk.  You need to re-schedule your appointment should you arrive 10 or more minutes late.  We strive to give you quality time with our providers, and arriving late affects you and other patients whose appointments are after yours.  Also, if you no show three or more times for appointments you may be dismissed from the clinic at the providers discretion.     Again, thank you for choosing [facility].  Our hope is that these requests will decrease the amount of time that you wait before being seen by our physicians.       _____________________________________________________________  Should you have questions after your visit to [facility], please contact our office at [phone] between the hours of 8:00 a.m. and 4:30 p.m.  Voicemails left after 4:00 p.m. will not be returned until the following business day.  For prescription refill requests, have your pharmacy contact our office and allow 72 hours.    Due to Covid, you will need to wear a mask upon entering the hospital. If you do not have a mask, a mask will be given to you at the Main Entrance upon arrival. For doctor visits, patients may have 1 support person with them. For treatment visits, patients can not have anyone with them due to social distancing guidelines and our immunocompromised population.     

## 2019-08-24 ENCOUNTER — Ambulatory Visit (HOSPITAL_COMMUNITY): Payer: PRIVATE HEALTH INSURANCE | Admitting: Hematology

## 2019-08-24 LAB — CEA: CEA: 1.2 ng/mL (ref 0.0–4.7)

## 2019-08-30 ENCOUNTER — Other Ambulatory Visit: Payer: Self-pay

## 2019-08-30 ENCOUNTER — Inpatient Hospital Stay (HOSPITAL_BASED_OUTPATIENT_CLINIC_OR_DEPARTMENT_OTHER): Payer: No Typology Code available for payment source | Admitting: Hematology

## 2019-08-30 ENCOUNTER — Encounter (HOSPITAL_COMMUNITY): Payer: Self-pay | Admitting: Hematology

## 2019-08-30 DIAGNOSIS — C187 Malignant neoplasm of sigmoid colon: Secondary | ICD-10-CM

## 2019-08-30 NOTE — Patient Instructions (Addendum)
[place] at [facility] Discharge Instructions  You were seen today by Dr. Delton Coombes. He went over your recent lab and scan results. He reviewed how your surgery went and what the results were of your surgery. This is considered a stage 3 colon cancer because it has moved to the lymph nodes. Dr Delton Coombes will reach out to [facility] and [facility] to see if they have any clinical trials that you may qualify for. He will see you back in 2 weeks for follow up.   Thank you for choosing [place] at [facility] to provide your oncology and hematology care.  To afford each patient quality time with our provider, please arrive at least 15 minutes before your scheduled appointment time.   If you have a lab appointment with the [facility] please come in thru the  Main Entrance and check in at the main information desk  You need to re-schedule your appointment should you arrive 10 or more minutes late.  We strive to give you quality time with our providers, and arriving late affects you and other patients whose appointments are after yours.  Also, if you no show three or more times for appointments you may be dismissed from the clinic at the providers discretion.     Again, thank you for choosing [facility].  Our hope is that these requests will decrease the amount of time that you wait before being seen by our physicians.       _____________________________________________________________  Should you have questions after your visit to [facility], please contact our office at [phone] between the hours of 8:00 a.m. and 4:30 p.m.  Voicemails left after 4:00 p.m. will not be returned until the following business day.  For prescription refill requests, have your pharmacy contact our office and allow 72 hours.    Cancer Center Support Programs:   > Cancer Support Group  2nd Tuesday of the month 1pm-2pm, Journey Room

## 2019-08-30 NOTE — Progress Notes (Signed)
[place] [place], [place] [address]   CLINIC:  Medical Oncology/Hematology  PCP:  Sharilyn Sites, [place] [address] [phone]   REASON FOR VISIT:  Recurrent colon cancer.  CURRENT THERAPY:Further recommendations for treatment pending.   BRIEF ONCOLOGIC HISTORY:  Oncology History  Malignant neoplasm of sigmoid colon [facility])   Initial Diagnosis   Cancer of sigmoid colon ([place])   03/27/2018 Genetic Testing   ALK c.350C>G VUS identified on the multicancer panel.  The Multi-Gene Panel offered by [facility] includes sequencing and/or deletion duplication testing of the following 85 genes: AIP, ALK, APC, ATM, AXIN2,BAP1,  BARD1, BLM, BMPR1A, BRCA1, BRCA2, BRIP1, CASR, CDC73, CDH1, CDK4, CDKN1B, CDKN1C, CDKN2A (p14ARF), CDKN2A (p16INK4a), CEBPA, CHEK2, CTNNA1, DICER1, DIS3L2, EGFR (c.2369C>T, p.Thr790Met variant only), EPCAM (Deletion/duplication testing only), FH, FLCN, GATA2, GPC3, GREM1 (Promoter region deletion/duplication testing only), HOXB13 (c.251G>A, p.Gly84Glu), HRAS, KIT, MAX, MEN1, MET, MITF (c.952G>A, p.Glu318Lys variant only), MLH1, MSH2, MSH3, MSH6, MUTYH, NBN, NF1, NF2, NTHL1, PALB2, PDGFRA, PHOX2B, PMS2, POLD1, POLE, POT1, PRKAR1A, PTCH1, PTEN, RAD50, RAD51C, RAD51D, RB1, RECQL4, RET, RNF43, RUNX1, SDHAF2, SDHA (sequence changes only), SDHB, SDHC, SDHD, SMAD4, SMARCA4, SMARCB1, SMARCE1, STK11, SUFU, TERC, TERT, TMEM127, TP53, TSC1, TSC2, VHL, WRN and WT1.   The report date is 03/27/2018.   04/21/2018 -  Chemotherapy   The patient had palonosetron (ALOXI) injection 0.25 mg, 0.25 mg, Intravenous,  Once, 13 of 13 cycles Administration: 0.25 mg (04/21/2018), 0.25 mg (05/05/2018), 0.25 mg (05/19/2018), 0.25 mg (06/02/2018), 0.25 mg (06/17/2018), 0.25 mg (06/30/2018), 0.25 mg (07/13/2018), 0.25 mg (08/11/2018), 0.25 mg (08/25/2018), 0.25 mg (09/08/2018), 0.25 mg (09/22/2018), 0.25 mg (10/06/2018) leucovorin 800 mg in dextrose 5 % 250 mL  infusion, 876 mg, Intravenous,  Once, 13 of 13 cycles Administration: 800 mg (04/21/2018), 900 mg (05/05/2018), 800 mg (05/19/2018), 800 mg (06/02/2018), 900 mg (06/17/2018), 900 mg (06/30/2018), 900 mg (07/13/2018), 900 mg (08/11/2018), 900 mg (08/25/2018), 876 mg (09/22/2018), 900 mg (09/08/2018), 876 mg (10/06/2018) oxaliplatin (ELOXATIN) 185 mg in dextrose 5 % 500 mL chemo infusion, 85 mg/m2 = 185 mg, Intravenous,  Once, 13 of 13 cycles Dose modification: 68 mg/m2 (80 % of original dose 85 mg/m2, Cycle 12, Reason: Other (see comments), Comment: neuropathy), 42.5 mg/m2 (50 % of original dose 85 mg/m2, Cycle 13, Reason: Provider Judgment) Administration: 185 mg (04/21/2018), 185 mg (05/05/2018), 185 mg (05/19/2018), 185 mg (06/02/2018), 185 mg (06/17/2018), 185 mg (06/30/2018), 185 mg (07/13/2018), 185 mg (08/11/2018), 185 mg (08/25/2018), 185 mg (09/08/2018), 150 mg (09/22/2018), 95 mg (10/06/2018) fluorouracil (ADRUCIL) chemo injection 900 mg, 400 mg/m2 = 900 mg, Intravenous,  Once, 13 of 13 cycles Administration: 900 mg (04/21/2018), 900 mg (05/05/2018), 900 mg (05/19/2018), 900 mg (06/02/2018), 900 mg (06/17/2018), 900 mg (06/30/2018), 900 mg (07/13/2018), 900 mg (08/11/2018), 900 mg (08/25/2018), 900 mg (09/08/2018), 900 mg (09/22/2018), 900 mg (10/06/2018) fosaprepitant (EMEND) 150 mg, dexamethasone (DECADRON) 12 mg in sodium chloride 0.9 % 145 mL IVPB, , Intravenous,  Once, 10 of 10 cycles Administration:  (06/02/2018),  (06/17/2018),  (06/30/2018),  (07/13/2018),  (08/11/2018),  (08/25/2018),  (09/08/2018),  (09/22/2018),  (10/06/2018) fluorouracil (ADRUCIL) 5,250 mg in sodium chloride 0.9 % 145 mL chemo infusion, 2,400 mg/m2 = 5,250 mg, Intravenous, 1 Day/Dose, 13 of 13 cycles Administration: 5,250 mg (04/21/2018), 5,250 mg (05/05/2018), 5,250 mg (05/19/2018), 5,250 mg (06/02/2018), 5,250 mg (06/17/2018), 5,250 mg (06/30/2018), 5,250 mg (07/13/2018), 5,250 mg (08/11/2018), 5,250 mg (08/25/2018), 5,250 mg (09/08/2018), 5,250 mg (09/22/2018), 5,250 mg (10/06/2018)   for chemotherapy  treatment.       CANCER STAGING: Cancer Staging No matching staging information was found for the patient.   INTERVAL HISTORY:  Mr. Rahming 44 y.o. male seen for follow-up after recent colon surgery.  Reports appetite 75%.  Energy level 75%.  Has constipation alternating with diarrhea.  Overall he reported that he tolerated the second surgery better than his first colon surgery.  He reports complete resolution of neuropathy.    REVIEW OF SYSTEMS:  Review of Systems  Gastrointestinal: Positive for constipation and diarrhea.  Neurological: Positive for dizziness.  All other systems reviewed and are negative.    PAST MEDICAL/SURGICAL HISTORY:  Past Medical History:  Diagnosis Date  . Diabetes mellitus without complication ([place])   . Family history of breast cancer   . GERD (gastroesophageal reflux disease)   . History of kidney stones   . Hypercholesterolemia   . Hypertension   . Urethral stricture    Past Surgical History:  Procedure Laterality Date  . BALLOON DILATION  04/06/2012   Procedure: BALLOON DILATION;  Surgeon: Reece Packer, MD;  [facility];  Service: Urology;  Laterality: N/A;  . BIOPSY  02/24/2018   Procedure: BIOPSY;  Surgeon: Danie Binder, MD;  [facility];  Service: Endoscopy;;  colon  . BIOPSY  06/01/2019   Procedure: BIOPSY;  Surgeon: Danie Binder, MD;  [facility];  Service: Endoscopy;;  . BOWEL RESECTION N/A 07/28/2019   Procedure: LOW ANTERIOR BOWEL RESECTION;  Surgeon: Aviva Signs, MD;  [facility];  Service: General;  Laterality: N/A;  . COLONOSCOPY WITH PROPOFOL N/A 02/24/2018   Dr. Oneida Alar: fungating, infiltrative partially obstructing mass in sigmoid colon, area tattooed. ext/int hemorrhoids. Bx: adenocarcinoma  . COLONOSCOPY WITH PROPOFOL N/A 06/01/2019   Procedure: COLONOSCOPY WITH PROPOFOL;  Surgeon: Danie Binder, MD;  [facility];  Service:  Endoscopy;  Laterality: N/A;  9:30am  . CYSTOSCOPY WITH HOLMIUM LASER LITHOTRIPSY  08/07/2019   Procedure: CYSTOSCOPY WITH HOLMIUM LASER LITHOTRIPSY;  Surgeon: Festus Aloe, MD;  [facility];  Service: Urology;;  . CYSTOSCOPY/RETROGRADE/URETEROSCOPY/STONE EXTRACTION WITH BASKET Left 08/07/2019   Procedure: CYSTOSCOPY/RETROGRADE/URETEROSCOPY/STONE EXTRACTION WITH BASKET;  Surgeon: Festus Aloe, MD;  [facility];  Service: Urology;  Laterality: Left;  . ESOPHAGOGASTRODUODENOSCOPY (EGD) WITH PROPOFOL N/A 08/29/2015   Dr. Oneida Alar: possible proximal esophageal web s/p dilation, moderate gastritis, negative eosinophilic esophagitis   . ESOPHAGOGASTRODUODENOSCOPY (EGD) WITH PROPOFOL  06/01/2019   Procedure: ESOPHAGOGASTRODUODENOSCOPY (EGD) WITH PROPOFOL;  Surgeon: Danie Binder, MD;  [facility];  Service: Endoscopy;;  . PARTIAL COLECTOMY N/A 03/02/2018   Procedure: PARTIAL COLECTOMY;  Surgeon: Aviva Signs, MD;  [facility];  Service: General;  Laterality: N/A;  . PORTACATH PLACEMENT Left 04/13/2018   Procedure: INSERTION PORT-A-CATH;  Surgeon: Aviva Signs, MD;  [facility];  Service: General;  Laterality: Left;  . SAVORY DILATION N/A 08/29/2015   Procedure: SAVORY DILATION;  Surgeon: Danie Binder, MD;  [facility];  Service: Endoscopy;  Laterality: N/A;  . URETHRAL DILATION    . WISDOM TOOTH EXTRACTION       SOCIAL HISTORY:  Social History   Socioeconomic History  . Marital status: Divorced    Spouse name: Not on file  . Number of children: Not on file  . Years of education: Not on file  . Highest education level: Not on file  Occupational History  . Not on file  Tobacco Use  .  Smoking status: Former Smoker    Packs/day: 0.25    Years: 9.00    Pack years: 2.25    Types: Cigarettes    Quit date: 07/27/2011    Years since quitting: 8.0  . Smokeless tobacco: Never Used  Substance and Sexual Activity  . Alcohol use: Yes     Alcohol/week: 6.0 standard drinks    Types: 6 Cans of beer per week  . Drug use: No  . Sexual activity: Yes    Birth control/protection: None  Other Topics Concern  . Not on file  Social History Narrative  . Not on file   Social Determinants of Health   Financial Resource Strain:   . Difficulty of Paying Living Expenses:   Food Insecurity:   . Worried About Charity fundraiser in the Last Year:   . Arboriculturist in the Last Year:   Transportation Needs:   . Film/video editor (Medical):   Marland Kitchen Lack of Transportation (Non-Medical):   Physical Activity:   . Days of Exercise per Week:   . Minutes of Exercise per Session:   Stress:   . Feeling of Stress :   Social Connections:   . Frequency of Communication with Friends and Family:   . Frequency of Social Gatherings with Friends and Family:   . Attends Religious Services:   . Active Member of Clubs or Organizations:   . Attends Archivist Meetings:   Marland Kitchen Marital Status:   Intimate Partner Violence:   . Fear of Current or Ex-Partner:   . Emotionally Abused:   Marland Kitchen Physically Abused:   . Sexually Abused:     FAMILY HISTORY:  Family History  Problem Relation Age of Onset  . Dementia Mother   . Dementia Maternal Aunt   . Dementia Maternal Grandmother   . COPD Maternal Grandfather   . Breast cancer Maternal Aunt 42  . Breast cancer Maternal Aunt        dx in her 53s  . Lung cancer Maternal Aunt   . Healthy Son   . Colon cancer Neg Hx     CURRENT MEDICATIONS:  Outpatient Encounter Medications as of 08/30/2019  Medication Sig  . CONTOUR NEXT TEST test strip USE TO CHECK BLOOD SUGAR BID  . glipiZIDE (GLUCOTROL XL) 5 MG 24 hr tablet Take two tablets daily until you see Dr. Hilma Favors (Patient taking differently: 5 mg daily. )  . JENTADUETO 2.5-500 MG TABS   . losartan (COZAAR) 100 MG tablet Take 100 mg by mouth daily.  . magnesium oxide (MAG-OX) 400 MG tablet Take 400 mg by mouth daily.  . Multiple Vitamin  (MULTIVITAMIN) tablet Take 1 tablet by mouth daily.  . pantoprazole (PROTONIX) 40 MG tablet Take 40 mg by mouth daily.  . Potassium 99 MG TABS Take 99 mg by mouth daily.  . pravastatin (PRAVACHOL) 20 MG tablet Take 20 mg by mouth daily.   Marland Kitchen topiramate (TOPAMAX) 100 MG tablet Take 100 mg by mouth daily.   Marland Kitchen albuterol (VENTOLIN HFA) 108 (90 Base) MCG/ACT inhaler Inhale 2 puffs into the lungs every 6 (six) hours as needed for wheezing or shortness of breath.   . [DISCONTINUED] glipiZIDE (GLUCOTROL XL) 5 MG 24 hr tablet TAKE 1 TABLET(5 MG) BY MOUTH DAILY WITH BREAKFAST   Facility-Administered Encounter Medications as of 08/30/2019  Medication  . sodium chloride flush (NS) 0.9 % injection 10 mL    ALLERGIES:  No Known Allergies   PHYSICAL EXAM:  ECOG Performance status: 1  Vitals:   08/30/19 1414  BP: 129/83  Pulse: 98  Resp: 19  Temp: (!) 97.1 F (36.2 C)  SpO2: 98%   Filed Weights   08/30/19 1414  Weight: 235 lb 3.2 oz (106.7 kg)    Physical Exam Vitals reviewed.  Constitutional:      Appearance: Normal appearance.  Cardiovascular:     Rate and Rhythm: Normal rate and regular rhythm.     Heart sounds: Normal heart sounds.  Pulmonary:     Effort: Pulmonary effort is normal.     Breath sounds: Normal breath sounds.  Abdominal:     General: There is no distension.     Palpations: Abdomen is soft. There is no mass.  Musculoskeletal:        General: No swelling.  Skin:    General: Skin is warm.  Neurological:     General: No focal deficit present.     Mental Status: He is alert and oriented to person, place, and time.  Psychiatric:        Mood and Affect: Mood normal.        Behavior: Behavior normal.      LABORATORY DATA:  I have reviewed the labs as listed.  CBC    Component Value Date/Time   WBC 6.0 08/23/2019 1147   RBC 4.83 08/23/2019 1147   HGB 13.4 08/23/2019 1147   HCT 40.8 08/23/2019 1147   PLT 201 08/23/2019 1147   MCV 84.5 08/23/2019 1147    MCH 27.7 08/23/2019 1147   MCHC 32.8 08/23/2019 1147   RDW 13.1 08/23/2019 1147   LYMPHSABS 1.8 08/23/2019 1147   MONOABS 0.3 08/23/2019 1147   EOSABS 0.3 08/23/2019 1147   BASOSABS 0.0 08/23/2019 1147   CMP Latest Ref Rng & Units 08/23/2019 08/07/2019 07/30/2019  Glucose 70 - 99 mg/dL 125(H) 246(H) 148(H)  BUN 6 - 20 mg/dL 16 22(H) 14  Creatinine 0.61 - 1.24 mg/dL 0.83 1.01 1.01  Sodium 135 - 145 mmol/L 139 136 141  Potassium 3.5 - 5.1 mmol/L 3.8 3.2(L) 3.5  Chloride 98 - 111 mmol/L 108 104 109  CO2 22 - 32 mmol/L _0 Calcium 8.9 - 10.3 mg/dL 8.8(L) 8.7(L) 8.3(L)  Total Protein 6.5 - 8.1 g/dL 6.6 7.5 -  Total Bilirubin 0.3 - 1.2 mg/dL 0.6 0.4 -  Alkaline Phos 38 - 126 U/L 44 58 -  AST 15 - 41 U/L 27 29 -  ALT 0 - 44 U/L 49(H) 63(H) -       DIAGNOSTIC IMAGING:  I have reviewed scans.      ASSESSMENT & PLAN:   Malignant neoplasm of sigmoid colon (HCC) Assessment: 1.  Stage III (PT3PN2B) sigmoid colon adenocarcinoma, MSI stable, status post sigmoid colon resection on 03/02/2018, 14/35 lymph nodes positive. -12 cycles of FOLFOX from 04/21/2018 through 10/06/2018. -Repeat colonoscopy on 06/01/2019 showed recurrence at the anastomotic area, biopsy consistent with adenocarcinoma. -PET scan on 07/05/2019 shows increased uptake in the anastomotic area.  There are 2 small 5 mm nodules in the right upper lobe and left upper lobe which are new.  These are not hypermetabolic on PET scan given the small size.  Patient also had COVID-19 infection earlier this year.  Plan: 1. Recurrent sigmoid colon adenocarcinoma: -We reviewed pathology from 07/28/2018 which showed recurrent moderately differentiated adenocarcinoma, metastatic carcinoma in 3/6 lymph nodes.  Margins are negative.  Grade 2.  RpT 2, RpN1B. -We will obtain MMR testing.  CEA  was normal at 1.2.  He will be candidate for 3 months of XELOX-based regimen based on idea collaboration study. -We will reach out to [employer]  and [facility] to see if there are any clinical trials available. -We will see him back in 2 weeks for follow-up and discuss further.  2.  Peripheral neuropathy: -He had some numbness in the fingertips and toes which has completely resolved at this time.  3.  Elevated LFTs: -He has mildly elevated ALT which is likely from fatty liver noted on previous scans.      Orders placed this encounter:  No orders of the defined types were placed in this encounter.     Derek Jack, MD [facility] [phone]

## 2019-08-30 NOTE — Assessment & Plan Note (Signed)
Assessment: 1.  Stage III (PT3PN2B) sigmoid colon adenocarcinoma, MSI stable, status post sigmoid colon resection on 03/02/2018, 14/35 lymph nodes positive. -12 cycles of FOLFOX from 04/21/2018 through 10/06/2018. -Repeat colonoscopy on 06/01/2019 showed recurrence at the anastomotic area, biopsy consistent with adenocarcinoma. -PET scan on 07/05/2019 shows increased uptake in the anastomotic area.  There are 2 small 5 mm nodules in the right upper lobe and left upper lobe which are new.  These are not hypermetabolic on PET scan given the small size.  Patient also had COVID-19 infection earlier this year.  Plan: 1. Recurrent sigmoid colon adenocarcinoma: -We reviewed pathology from 07/28/2018 which showed recurrent moderately differentiated adenocarcinoma, metastatic carcinoma in 3/6 lymph nodes.  Margins are negative.  Grade 2.  RpT 2, RpN1B. -We will obtain MMR testing.  CEA was normal at 1.2.  He will be candidate for 3 months of XELOX-based regimen based on idea collaboration study. -We will reach out to [employer] and [facility] to see if there are any clinical trials available. -We will see him back in 2 weeks for follow-up and discuss further.  2.  Peripheral neuropathy: -He had some numbness in the fingertips and toes which has completely resolved at this time.  3.  Elevated LFTs: -He has mildly elevated ALT which is likely from fatty liver noted on previous scans.

## 2019-08-31 ENCOUNTER — Encounter (HOSPITAL_COMMUNITY): Payer: Self-pay | Admitting: *Deleted

## 2019-08-31 NOTE — Progress Notes (Signed)
I emailed pathology and requested MMR testing on specimen [id number].

## 2019-09-01 LAB — SURGICAL PATHOLOGY

## 2019-09-09 ENCOUNTER — Encounter (HOSPITAL_COMMUNITY): Payer: Self-pay | Admitting: Emergency Medicine

## 2019-09-09 ENCOUNTER — Telehealth: Payer: Self-pay | Admitting: Gastroenterology

## 2019-09-09 ENCOUNTER — Encounter: Payer: Self-pay | Admitting: Family Medicine

## 2019-09-09 ENCOUNTER — Emergency Department (HOSPITAL_COMMUNITY): Payer: No Typology Code available for payment source

## 2019-09-09 ENCOUNTER — Emergency Department (HOSPITAL_COMMUNITY)
Admission: EM | Admit: 2019-09-09 | Discharge: 2019-09-09 | Disposition: A | Discharge status: Home / Self Care | Payer: No Typology Code available for payment source | Attending: Emergency Medicine | Admitting: Emergency Medicine

## 2019-09-09 ENCOUNTER — Other Ambulatory Visit: Payer: Self-pay

## 2019-09-09 DIAGNOSIS — I10 Essential (primary) hypertension: Secondary | ICD-10-CM | POA: Diagnosis not present

## 2019-09-09 DIAGNOSIS — Z87891 Personal history of nicotine dependence: Secondary | ICD-10-CM | POA: Diagnosis not present

## 2019-09-09 DIAGNOSIS — R197 Diarrhea, unspecified: Secondary | ICD-10-CM

## 2019-09-09 DIAGNOSIS — K921 Melena: Secondary | ICD-10-CM | POA: Diagnosis not present

## 2019-09-09 DIAGNOSIS — E119 Type 2 diabetes mellitus without complications: Secondary | ICD-10-CM | POA: Insufficient documentation

## 2019-09-09 DIAGNOSIS — R1031 Right lower quadrant pain: Secondary | ICD-10-CM | POA: Diagnosis present

## 2019-09-09 LAB — COMPREHENSIVE METABOLIC PANEL
ALT: 51 U/L — ABNORMAL HIGH (ref 0–44)
AST: 28 U/L (ref 15–41)
Albumin: 4.5 g/dL (ref 3.5–5.0)
Alkaline Phosphatase: 51 U/L (ref 38–126)
Anion gap: 11 (ref 5–15)
BUN: 11 mg/dL (ref 6–20)
CO2: 23 mmol/L (ref 22–32)
Calcium: 9.3 mg/dL (ref 8.9–10.3)
Chloride: 104 mmol/L (ref 98–111)
Creatinine, Ser: 0.97 mg/dL (ref 0.61–1.24)
GFR calc Af Amer: >60 mL/min (ref >60)
GFR calc non Af Amer: >60 mL/min (ref >60)
Glucose, Bld: 135 mg/dL — ABNORMAL HIGH (ref 70–99)
Potassium: 3.9 mmol/L (ref 3.5–5.1)
Sodium: 138 mmol/L (ref 135–145)
Total Bilirubin: 0.7 mg/dL (ref 0.3–1.2)
Total Protein: 7.5 g/dL (ref 6.5–8.1)

## 2019-09-09 LAB — URINALYSIS, ROUTINE W REFLEX MICROSCOPIC
Bilirubin Urine: NEGATIVE
Glucose, UA: NEGATIVE mg/dL
Hgb urine dipstick: NEGATIVE
Ketones, ur: NEGATIVE mg/dL
Leukocytes,Ua: NEGATIVE
Nitrite: NEGATIVE
Protein, ur: NEGATIVE mg/dL
Specific Gravity, Urine: 1.017 (ref 1.005–1.030)
pH: 8 (ref 5.0–8.0)

## 2019-09-09 LAB — CBC
HCT: 44.4 % (ref 39.0–52.0)
Hemoglobin: 14.6 g/dL (ref 13.0–17.0)
MCH: 27.9 pg (ref 26.0–34.0)
MCHC: 32.9 g/dL (ref 30.0–36.0)
MCV: 84.7 fL (ref 80.0–100.0)
Platelets: 189 10*3/uL (ref 150–400)
RBC: 5.24 MIL/uL (ref 4.22–5.81)
RDW: 13.4 % (ref 11.5–15.5)
WBC: 7.6 10*3/uL (ref 4.0–10.5)
nRBC: 0 % (ref 0.0–0.2)

## 2019-09-09 LAB — LACTIC ACID, PLASMA: Lactic Acid, Venous: 1.6 mmol/L (ref 0.5–1.9)

## 2019-09-09 LAB — LIPASE, BLOOD: Lipase: 41 U/L (ref 11–51)

## 2019-09-09 MED ORDER — SODIUM CHLORIDE 0.9 % IV BOLUS
1000.0000 mL | Freq: Once | INTRAVENOUS | Status: AC
  Administered 2019-09-09: 1000 mL via INTRAVENOUS

## 2019-09-09 MED ORDER — IOHEXOL 300 MG/ML  SOLN
100.0000 mL | Freq: Once | INTRAMUSCULAR | Status: AC | PRN
  Administered 2019-09-09: 100 mL via INTRAVENOUS

## 2019-09-09 MED ORDER — AMOXICILLIN-POT CLAVULANATE 875-125 MG PO TABS
1.0000 | ORAL_TABLET | Freq: Once | ORAL | Status: AC
  Administered 2019-09-09: 1 via ORAL
  Filled 2019-09-09: qty 1

## 2019-09-09 MED ORDER — AMOXICILLIN-POT CLAVULANATE 875-125 MG PO TABS
1.0000 | ORAL_TABLET | Freq: Two times a day (BID) | ORAL | 0 refills | Status: AC
Start: 2019-09-09 — End: 2019-09-16

## 2019-09-09 NOTE — Telephone Encounter (Signed)
Called notified pt and Pt stated he is currently at the er

## 2019-09-09 NOTE — ED Provider Notes (Signed)
[facility] Emergency Department Provider Note MRN:  [id number]  Arrival date & time: 09/09/19     Chief Complaint   Abdominal Pain   History of Present Illness   Joel Chesnut. is a 44 y.o. year-old male with a history of diabetes, hypertension presenting to the ED with chief complaint of abdominal pain.  Right lower quadrant abdominal pain, fever, blood in stool for the past 2 days.  Bright red blood.  Diarrhea.  Denies chest pain, shortness of breath, no headache or vision change.  Pain mild to moderate severity, constant.  No sick contacts, no recent robotics, no recent travel.  Had partial colectomy for colon cancer back in April.  Review of Systems  A complete 10 system review of systems was obtained and all systems are negative except as noted in the HPI and PMH.   Patient's Health History    Past Medical History:  Diagnosis Date  . Diabetes mellitus without complication ([place])   . Family history of breast cancer   . GERD (gastroesophageal reflux disease)   . History of kidney stones   . Hypercholesterolemia   . Hypertension   . Urethral stricture     Past Surgical History:  Procedure Laterality Date  . BALLOON DILATION  04/06/2012   Procedure: BALLOON DILATION;  Surgeon: Reece Packer, MD;  Location: [facility];  Service: Urology;  Laterality: N/A;  . BIOPSY  02/24/2018   Procedure: BIOPSY;  Surgeon: Danie Binder, MD;  [facility];  Service: Endoscopy;;  colon  . BIOPSY  06/01/2019   Procedure: BIOPSY;  Surgeon: Danie Binder, MD;  [facility];  Service: Endoscopy;;  . BOWEL RESECTION N/A 07/28/2019   Procedure: LOW ANTERIOR BOWEL RESECTION;  Surgeon: Aviva Signs, MD;  Location: [facility];  Service: General;  Laterality: N/A;  . COLONOSCOPY WITH PROPOFOL N/A 02/24/2018   Dr. Oneida Alar: fungating, infiltrative partially obstructing mass in sigmoid colon, area tattooed. ext/int hemorrhoids. Bx:  adenocarcinoma  . COLONOSCOPY WITH PROPOFOL N/A 06/01/2019   Procedure: COLONOSCOPY WITH PROPOFOL;  Surgeon: Danie Binder, MD;  [facility];  Service: Endoscopy;  Laterality: N/A;  9:30am  . CYSTOSCOPY WITH HOLMIUM LASER LITHOTRIPSY  08/07/2019   Procedure: CYSTOSCOPY WITH HOLMIUM LASER LITHOTRIPSY;  Surgeon: Festus Aloe, MD;  Location: [facility];  Service: Urology;;  . CYSTOSCOPY/RETROGRADE/URETEROSCOPY/STONE EXTRACTION WITH BASKET Left 08/07/2019   Procedure: CYSTOSCOPY/RETROGRADE/URETEROSCOPY/STONE EXTRACTION WITH BASKET;  Surgeon: Festus Aloe, MD;  Location: [facility];  Service: Urology;  Laterality: Left;  . ESOPHAGOGASTRODUODENOSCOPY (EGD) WITH PROPOFOL N/A 08/29/2015   Dr. Oneida Alar: possible proximal esophageal web s/p dilation, moderate gastritis, negative eosinophilic esophagitis   . ESOPHAGOGASTRODUODENOSCOPY (EGD) WITH PROPOFOL  06/01/2019   Procedure: ESOPHAGOGASTRODUODENOSCOPY (EGD) WITH PROPOFOL;  Surgeon: Danie Binder, MD;  [facility];  Service: Endoscopy;;  . PARTIAL COLECTOMY N/A 03/02/2018   Procedure: PARTIAL COLECTOMY;  Surgeon: Aviva Signs, MD;  Location: [facility];  Service: General;  Laterality: N/A;  . PORTACATH PLACEMENT Left 04/13/2018   Procedure: INSERTION PORT-A-CATH;  Surgeon: Aviva Signs, MD;  Location: [facility];  Service: General;  Laterality: Left;  . SAVORY DILATION N/A 08/29/2015   Procedure: SAVORY DILATION;  Surgeon: Danie Binder, MD;  [facility];  Service: Endoscopy;  Laterality: N/A;  . URETHRAL DILATION    . WISDOM TOOTH EXTRACTION      Family History  Problem Relation Age of Onset  . Dementia Mother   .  Dementia Maternal Aunt   . Dementia Maternal Grandmother   . COPD Maternal Grandfather   . Breast cancer Maternal Aunt 42  . Breast cancer Maternal Aunt        dx in her 58s  . Lung cancer Maternal Aunt   . Healthy Son   . Colon cancer Neg Hx     Social History   Socioeconomic History  . Marital status:  Divorced    Spouse name: Not on file  . Number of children: Not on file  . Years of education: Not on file  . Highest education level: Not on file  Occupational History  . Not on file  Tobacco Use  . Smoking status: Former Smoker    Packs/day: 0.25    Years: 9.00    Pack years: 2.25    Types: Cigarettes    Quit date: 07/27/2011    Years since quitting: 8.1  . Smokeless tobacco: Never Used  Substance and Sexual Activity  . Alcohol use: Yes    Alcohol/week: 6.0 standard drinks    Types: 6 Cans of beer per week  . Drug use: No  . Sexual activity: Yes    Birth control/protection: None  Other Topics Concern  . Not on file  Social History Narrative  . Not on file   Social Determinants of Health   Financial Resource Strain:   . Difficulty of Paying Living Expenses:   Food Insecurity:   . Worried About Charity fundraiser in the Last Year:   . Arboriculturist in the Last Year:   Transportation Needs:   . Film/video editor (Medical):   Marland Kitchen Lack of Transportation (Non-Medical):   Physical Activity:   . Days of Exercise per Week:   . Minutes of Exercise per Session:   Stress:   . Feeling of Stress :   Social Connections:   . Frequency of Communication with Friends and Family:   . Frequency of Social Gatherings with Friends and Family:   . Attends Religious Services:   . Active Member of Clubs or Organizations:   . Attends Archivist Meetings:   Marland Kitchen Marital Status:   Intimate Partner Violence:   . Fear of Current or Ex-Partner:   . Emotionally Abused:   Marland Kitchen Physically Abused:   . Sexually Abused:      Physical Exam   Vitals:   09/09/19 1500 09/09/19 1530  BP: 127/82 127/89  Pulse: 91 91  Resp: (!) 26 (!) 24  Temp:    SpO2: 97% 97%    CONSTITUTIONAL: Well-appearing, NAD NEURO:  Alert and oriented x 3, no focal deficits EYES:  eyes equal and reactive ENT/NECK:  no LAD, no JVD CARDIO: Tachycardic rate, well-perfused, normal S1 and S2 PULM:  CTAB no  wheezing or rhonchi GI/GU:  normal bowel sounds, non-distended, non-tender MSK/SPINE:  No gross deformities, no edema SKIN:  no rash, atraumatic PSYCH:  Appropriate speech and behavior  *Additional and/or pertinent findings included in MDM below  Diagnostic and Interventional Summary    EKG Interpretation  Date/Time:    Ventricular Rate:    PR Interval:    QRS Duration:   QT Interval:    QTC Calculation:   R Axis:     Text Interpretation:        Labs Reviewed  COMPREHENSIVE METABOLIC PANEL - Abnormal; Notable for the following components:      Result Value   Glucose, Bld 135 (*)    ALT 51 (*)  All other components within normal limits  URINALYSIS, ROUTINE W REFLEX MICROSCOPIC - Abnormal; Notable for the following components:   APPearance CLOUDY (*)    All other components within normal limits  CULTURE, BLOOD (SINGLE)  CBC  LIPASE, BLOOD  LACTIC ACID, PLASMA    CT ABDOMEN PELVIS W CONTRAST  Final Result      Medications  sodium chloride 0.9 % bolus 1,000 mL (1,000 mLs Intravenous New Bag/Given 09/09/19 1216)  amoxicillin-clavulanate (AUGMENTIN) 875-125 MG per tablet 1 tablet (1 tablet Oral Given 09/09/19 1332)  iohexol (OMNIPAQUE) 300 MG/ML solution 100 mL (100 mLs Intravenous Contrast Given 09/09/19 1257)     Procedures  /  Critical Care Procedures  ED Course and Medical Decision Making  I have reviewed the triage vital signs, the nursing notes, and pertinent available records from the EMR.  Listed above are laboratory and imaging tests that I personally ordered, reviewed, and interpreted and then considered in my medical decision making (see below for details).      Suspect bacterial etiology of diarrhea such as hemorrhagic E. coli or Shigella.  However given patient's recent partial colectomy, will CT to exclude anastomotic bleed.  CT is without acute surgical process.  There is question of intramural hematoma versus residual tumor, I suspect this is hematoma  given the density measured.  These findings are explained to patient, I sent a personal message to Dr. Arnoldo Morale to hopefully make him aware of the CT imaging and ensure no further actions are needed.  Patient is appropriate for discharge on Augmentin.  Continues to look and feel well, nontoxic, labs reassuring, no tachycardia.  Joel Jefferson, Joel Jefferson [email or web address]  Final Clinical Impressions(s) / ED Diagnoses     ICD-10-CM   1. Bloody diarrhea  R19.7     ED Discharge Orders         Ordered    amoxicillin-clavulanate (AUGMENTIN) 875-125 MG tablet  Every 12 hours     09/09/19 1549           Discharge Instructions Discussed with and Provided to Patient:     Discharge Instructions     You were evaluated in the Emergency Department and after careful evaluation, we did not find any emergent condition requiring admission or further testing in the hospital.  Your exam/testing today is overall reassuring.  Your symptoms seem to be due to a bacterial diarrhea.  Please take the antibiotics as directed and use Tylenol or Motrin at home for discomfort.  I sent a message to Dr. Arnoldo Morale regarding your CT imaging.  If you do not hear from him in 1 week, we recommend calling the office for his thoughts.  Please return to the Emergency Department if you experience any worsening of your condition.  We encourage you to follow up with a primary care provider.  Thank you for allowing [place] to be a part of your care.       Maudie Flakes, MD 09/09/19 [phone]

## 2019-09-09 NOTE — Telephone Encounter (Signed)
This encounter was created in error - please disregard.

## 2019-09-09 NOTE — ED Triage Notes (Signed)
Pt present with abdominal pain, fever, blood in stool x 2 days. Recent colon surgery in April

## 2019-09-09 NOTE — Telephone Encounter (Signed)
Pt had tcs done a few months ago, hx of colon cancer. He said for a couple of days he has noticed blood in his stools, he has hemorrhoids, but now he is running a fever and having abdominal pain. Please advise. [phone]

## 2019-09-09 NOTE — Discharge Instructions (Addendum)
You were evaluated in the Emergency Department and after careful evaluation, we did not find any emergent condition requiring admission or further testing in the hospital.  Your exam/testing today is overall reassuring.  Your symptoms seem to be due to a bacterial diarrhea.  Please take the antibiotics as directed and use Tylenol or Motrin at home for discomfort.  I sent a message to Dr. Arnoldo Morale regarding your CT imaging.  If you do not hear from him in 1 week, we recommend calling the office for his thoughts.  Please return to the Emergency Department if you experience any worsening of your condition.  We encourage you to follow up with a primary care provider.  Thank you for allowing [place] to be a part of your care.

## 2019-09-09 NOTE — ED Notes (Signed)
Pt back from CT

## 2019-09-09 NOTE — ED Notes (Signed)
Pt aware we need urine sample, urinal at bedside.  

## 2019-09-09 NOTE — Telephone Encounter (Signed)
Pt called stated doctor jenkins did surgery on him in April and since he is running a fever he did not know who he needed to f/u with. GI or doctor jenkins. Notified pt to please f/u with doctor jenkins or if he is running a fever with rectal bleeding he may need to go to the er. But that I would send the last provider her saw here in our office a message to see if they suggest anything different and call him back. Pt stated he understood and thanked me for the call

## 2019-09-09 NOTE — Telephone Encounter (Signed)
Patient in the ER.

## 2019-09-14 ENCOUNTER — Inpatient Hospital Stay (HOSPITAL_COMMUNITY): Payer: No Typology Code available for payment source | Attending: Hematology | Admitting: Hematology

## 2019-09-14 ENCOUNTER — Other Ambulatory Visit: Payer: Self-pay

## 2019-09-14 VITALS — BP 128/79 | Temp 98.0°F | Resp 18 | Wt 235.1 lb

## 2019-09-14 DIAGNOSIS — Z5111 Encounter for antineoplastic chemotherapy: Secondary | ICD-10-CM | POA: Insufficient documentation

## 2019-09-14 DIAGNOSIS — C187 Malignant neoplasm of sigmoid colon: Secondary | ICD-10-CM | POA: Diagnosis not present

## 2019-09-14 DIAGNOSIS — R7989 Other specified abnormal findings of blood chemistry: Secondary | ICD-10-CM | POA: Diagnosis not present

## 2019-09-14 DIAGNOSIS — E119 Type 2 diabetes mellitus without complications: Secondary | ICD-10-CM | POA: Diagnosis not present

## 2019-09-14 DIAGNOSIS — G620 Drug-induced polyneuropathy: Secondary | ICD-10-CM | POA: Insufficient documentation

## 2019-09-14 LAB — CULTURE, BLOOD (SINGLE)
Culture: NO GROWTH
Special Requests: ADEQUATE

## 2019-09-14 NOTE — Progress Notes (Addendum)
Joel Jefferson, Joel Jefferson [id number]   CLINIC:  Medical Oncology/Hematology  PCP:  Sharilyn Sites, [place] / [place] [address] [phone]   REASON FOR VISIT:  Follow-up for recurrent colon cancer  CURRENT THERAPY: XELOX.  BRIEF ONCOLOGIC HISTORY:  Oncology History  Malignant neoplasm of sigmoid colon [facility])   Initial Diagnosis   Cancer of sigmoid colon ([place])   03/27/2018 Genetic Testing   ALK c.350C>G VUS identified on the multicancer panel.  The Multi-Gene Panel offered by [facility] includes sequencing and/or deletion duplication testing of the following 85 genes: AIP, ALK, APC, ATM, AXIN2,BAP1,  BARD1, BLM, BMPR1A, BRCA1, BRCA2, BRIP1, CASR, CDC73, CDH1, CDK4, CDKN1B, CDKN1C, CDKN2A (p14ARF), CDKN2A (p16INK4a), CEBPA, CHEK2, CTNNA1, DICER1, DIS3L2, EGFR (c.2369C>T, p.Thr790Met variant only), EPCAM (Deletion/duplication testing only), FH, FLCN, GATA2, GPC3, GREM1 (Promoter region deletion/duplication testing only), HOXB13 (c.251G>A, p.Gly84Glu), HRAS, KIT, MAX, MEN1, MET, MITF (c.952G>A, p.Glu318Lys variant only), MLH1, MSH2, MSH3, MSH6, MUTYH, NBN, NF1, NF2, NTHL1, PALB2, PDGFRA, PHOX2B, PMS2, POLD1, POLE, POT1, PRKAR1A, PTCH1, PTEN, RAD50, RAD51C, RAD51D, RB1, RECQL4, RET, RNF43, RUNX1, SDHAF2, SDHA (sequence changes only), SDHB, SDHC, SDHD, SMAD4, SMARCA4, SMARCB1, SMARCE1, STK11, SUFU, TERC, TERT, TMEM127, TP53, TSC1, TSC2, VHL, WRN and WT1.   The report date is 03/27/2018.   04/21/2018 -  Chemotherapy   The patient had palonosetron (ALOXI) injection 0.25 mg, 0.25 mg, Intravenous,  Once, 13 of 13 cycles Administration: 0.25 mg (04/21/2018), 0.25 mg (05/05/2018), 0.25 mg (05/19/2018), 0.25 mg (06/02/2018), 0.25 mg (06/17/2018), 0.25 mg (06/30/2018), 0.25 mg (07/13/2018), 0.25 mg (08/11/2018), 0.25 mg (08/25/2018), 0.25 mg (09/08/2018), 0.25 mg (09/22/2018), 0.25 mg (10/06/2018) leucovorin 800 mg in dextrose 5 % 250 mL infusion, 876 mg, Intravenous,   Once, 13 of 13 cycles Administration: 800 mg (04/21/2018), 900 mg (05/05/2018), 800 mg (05/19/2018), 800 mg (06/02/2018), 900 mg (06/17/2018), 900 mg (06/30/2018), 900 mg (07/13/2018), 900 mg (08/11/2018), 900 mg (08/25/2018), 876 mg (09/22/2018), 900 mg (09/08/2018), 876 mg (10/06/2018) oxaliplatin (ELOXATIN) 185 mg in dextrose 5 % 500 mL chemo infusion, 85 mg/m2 = 185 mg, Intravenous,  Once, 13 of 13 cycles Dose modification: 68 mg/m2 (80 % of original dose 85 mg/m2, Cycle 12, Reason: Other (see comments), Comment: neuropathy), 42.5 mg/m2 (50 % of original dose 85 mg/m2, Cycle 13, Reason: Provider Judgment) Administration: 185 mg (04/21/2018), 185 mg (05/05/2018), 185 mg (05/19/2018), 185 mg (06/02/2018), 185 mg (06/17/2018), 185 mg (06/30/2018), 185 mg (07/13/2018), 185 mg (08/11/2018), 185 mg (08/25/2018), 185 mg (09/08/2018), 150 mg (09/22/2018), 95 mg (10/06/2018) fluorouracil (ADRUCIL) chemo injection 900 mg, 400 mg/m2 = 900 mg, Intravenous,  Once, 13 of 13 cycles Administration: 900 mg (04/21/2018), 900 mg (05/05/2018), 900 mg (05/19/2018), 900 mg (06/02/2018), 900 mg (06/17/2018), 900 mg (06/30/2018), 900 mg (07/13/2018), 900 mg (08/11/2018), 900 mg (08/25/2018), 900 mg (09/08/2018), 900 mg (09/22/2018), 900 mg (10/06/2018) fosaprepitant (EMEND) 150 mg, dexamethasone (DECADRON) 12 mg in sodium chloride 0.9 % 145 mL IVPB, , Intravenous,  Once, 10 of 10 cycles Administration:  (06/02/2018),  (06/17/2018),  (06/30/2018),  (07/13/2018),  (08/11/2018),  (08/25/2018),  (09/08/2018),  (09/22/2018),  (10/06/2018) fluorouracil (ADRUCIL) 5,250 mg in sodium chloride 0.9 % 145 mL chemo infusion, 2,400 mg/m2 = 5,250 mg, Intravenous, 1 Day/Dose, 13 of 13 cycles Administration: 5,250 mg (04/21/2018), 5,250 mg (05/05/2018), 5,250 mg (05/19/2018), 5,250 mg (06/02/2018), 5,250 mg (06/17/2018), 5,250 mg (06/30/2018), 5,250 mg (07/13/2018), 5,250 mg (08/11/2018), 5,250 mg (08/25/2018), 5,250 mg (09/08/2018), 5,250 mg (09/22/2018), 5,250 mg (10/06/2018)  for chemotherapy treatment.  CANCER STAGING: Cancer Staging No matching staging information was found for the patient.  INTERVAL HISTORY:  Joel Jefferson., a 44 y.o. male, returns for routine follow-up of his recurrent colon cancer. Kirill was last seen on 08/30/2019.  He reports feeling well today. He went to [facility] on 09/09/2019 after feeling feverish and having RLQ abdominal pain and blood in his stool the day before. The bleeding and abdominal pain has resolved. He is still taking Augmentin for 2 more days. His neuropathy and numbness has completely resolved. He has a good appetite and has x2-3 loose stools every day.   REVIEW OF SYSTEMS:  Review of Systems  Constitutional: Positive for fatigue (mild). Negative for appetite change.  Gastrointestinal: Negative for blood in stool.  Neurological: Negative for numbness.  All other systems reviewed and are negative.   PAST MEDICAL/SURGICAL HISTORY:  Past Medical History:  Diagnosis Date  . Diabetes mellitus without complication ([place])   . Family history of breast cancer   . GERD (gastroesophageal reflux disease)   . History of kidney stones   . Hypercholesterolemia   . Hypertension   . Urethral stricture    Past Surgical History:  Procedure Laterality Date  . BALLOON DILATION  04/06/2012   Procedure: BALLOON DILATION;  Surgeon: Reece Packer, MD;  [facility];  Service: Urology;  Laterality: N/A;  . BIOPSY  02/24/2018   Procedure: BIOPSY;  Surgeon: Danie Binder, MD;  [facility];  Service: Endoscopy;;  colon  . BIOPSY  06/01/2019   Procedure: BIOPSY;  Surgeon: Danie Binder, MD;  [facility];  Service: Endoscopy;;  . BOWEL RESECTION N/A 07/28/2019   Procedure: LOW ANTERIOR BOWEL RESECTION;  Surgeon: Aviva Signs, MD;  Location: [facility];  Service: General;  Laterality: N/A;  . COLONOSCOPY WITH PROPOFOL N/A 02/24/2018   Dr. Oneida Alar: fungating, infiltrative partially obstructing mass in  sigmoid colon, area tattooed. ext/int hemorrhoids. Bx: adenocarcinoma  . COLONOSCOPY WITH PROPOFOL N/A 06/01/2019   Procedure: COLONOSCOPY WITH PROPOFOL;  Surgeon: Danie Binder, MD;  [facility];  Service: Endoscopy;  Laterality: N/A;  9:30am  . CYSTOSCOPY WITH HOLMIUM LASER LITHOTRIPSY  08/07/2019   Procedure: CYSTOSCOPY WITH HOLMIUM LASER LITHOTRIPSY;  Surgeon: Festus Aloe, MD;  Location[facility];  Service: Urology;;  . CYSTOSCOPY/RETROGRADE/URETEROSCOPY/STONE EXTRACTION WITH BASKET Left 08/07/2019   Procedure: CYSTOSCOPY/RETROGRADE/URETEROSCOPY/STONE EXTRACTION WITH BASKET;  Surgeon: Festus Aloe, MD;  Location[facility];  Service: Urology;  Laterality: Left;  . ESOPHAGOGASTRODUODENOSCOPY (EGD) WITH PROPOFOL N/A 08/29/2015   Dr. Oneida Alar: possible proximal esophageal web s/p dilation, moderate gastritis, negative eosinophilic esophagitis   . ESOPHAGOGASTRODUODENOSCOPY (EGD) WITH PROPOFOL  06/01/2019   Procedure: ESOPHAGOGASTRODUODENOSCOPY (EGD) WITH PROPOFOL;  Surgeon: Danie Binder, MD;  [facility];  Service: Endoscopy;;  . PARTIAL COLECTOMY N/A 03/02/2018   Procedure: PARTIAL COLECTOMY;  Surgeon: Aviva Signs, MD;  Location[facility];  Service: General;  Laterality: N/A;  . PORTACATH PLACEMENT Left 04/13/2018   Procedure: INSERTION PORT-A-CATH;  Surgeon: Aviva Signs, MD;  Location: [facility];  Service: General;  Laterality: Left;  . SAVORY DILATION N/A 08/29/2015   Procedure: SAVORY DILATION;  Surgeon: Danie Binder, MD;  [facility];  Service: Endoscopy;  Laterality: N/A;  . URETHRAL DILATION    . WISDOM TOOTH EXTRACTION      SOCIAL HISTORY:  Social History   Socioeconomic History  . Marital status: Divorced    Spouse name: Not on file  .  Number of children: Not on file  . Years of education: Not on file  . Highest education level: Not on file  Occupational History  . Not on file  Tobacco Use  . Smoking status: Former Smoker    Packs/day:  0.25    Years: 9.00    Pack years: 2.25    Types: Cigarettes    Quit date: 07/27/2011    Years since quitting: 8.1  . Smokeless tobacco: Never Used  Substance and Sexual Activity  . Alcohol use: Yes    Alcohol/week: 6.0 standard drinks    Types: 6 Cans of beer per week  . Drug use: No  . Sexual activity: Yes    Birth control/protection: None  Other Topics Concern  . Not on file  Social History Narrative  . Not on file   Social Determinants of Health   Financial Resource Strain:   . Difficulty of Paying Living Expenses:   Food Insecurity:   . Worried About Charity fundraiser in the Last Year:   . Arboriculturist in the Last Year:   Transportation Needs:   . Film/video editor (Medical):   Marland Kitchen Lack of Transportation (Non-Medical):   Physical Activity:   . Days of Exercise per Week:   . Minutes of Exercise per Session:   Stress:   . Feeling of Stress :   Social Connections:   . Frequency of Communication with Friends and Family:   . Frequency of Social Gatherings with Friends and Family:   . Attends Religious Services:   . Active Member of Clubs or Organizations:   . Attends Archivist Meetings:   Marland Kitchen Marital Status:   Intimate Partner Violence:   . Fear of Current or Ex-Partner:   . Emotionally Abused:   Marland Kitchen Physically Abused:   . Sexually Abused:     FAMILY HISTORY:  Family History  Problem Relation Age of Onset  . Dementia Mother   . Dementia Maternal Aunt   . Dementia Maternal Grandmother   . COPD Maternal Grandfather   . Breast cancer Maternal Aunt 42  . Breast cancer Maternal Aunt        dx in her 20s  . Lung cancer Maternal Aunt   . Healthy Son   . Colon cancer Neg Hx     CURRENT MEDICATIONS:  Current Outpatient Medications  Medication Sig Dispense Refill  . albuterol (VENTOLIN HFA) 108 (90 Base) MCG/ACT inhaler Inhale 2 puffs into the lungs every 6 (six) hours as needed for wheezing or shortness of breath.     Marland Kitchen amoxicillin-clavulanate  (AUGMENTIN) 875-125 MG tablet Take 1 tablet by mouth every 12 (twelve) hours for 7 days. 14 tablet 0  . CONTOUR NEXT TEST test strip USE TO CHECK BLOOD SUGAR BID    . glipiZIDE (GLUCOTROL XL) 5 MG 24 hr tablet Take two tablets daily until you see Dr. Hilma Favors (Patient taking differently: 5 mg daily. ) 30 tablet 3  . JENTADUETO 2.5-500 MG TABS Take 1 tablet by mouth 2 (two) times daily.     Marland Kitchen losartan (COZAAR) 100 MG tablet Take 100 mg by mouth daily.    . magnesium oxide (MAG-OX) 400 MG tablet Take 400 mg by mouth daily.    . Multiple Vitamin (MULTIVITAMIN) tablet Take 1 tablet by mouth daily.    . pantoprazole (PROTONIX) 40 MG tablet Take 40 mg by mouth daily.    . Potassium 99 MG TABS Take 99 mg by mouth daily.    Marland Kitchen  pravastatin (PRAVACHOL) 20 MG tablet Take 20 mg by mouth daily.   1  . topiramate (TOPAMAX) 100 MG tablet Take 100 mg by mouth daily.      No current facility-administered medications for this visit.   Facility-Administered Medications Ordered in Other Visits  Medication Dose Route Frequency Provider Last Rate Last Admin  . sodium chloride flush (NS) 0.9 % injection 10 mL  10 mL Intracatheter PRN Derek Jack, MD   10 mL at 09/10/18 1347    ALLERGIES:  No Known Allergies  PHYSICAL EXAM:  Performance status (ECOG): 1 - Symptomatic but completely ambulatory  Vitals:   09/14/19 1546  BP: 128/79  Resp: 18  Temp: 98 F (36.7 C)  SpO2: 98%   Wt Readings from Last 3 Encounters:  09/14/19 235 lb 1.6 oz (106.6 kg)  09/09/19 235 lb (106.6 kg)  08/30/19 235 lb 3.2 oz (106.7 kg)   Physical Exam Vitals reviewed.  Constitutional:      Appearance: Normal appearance. He is obese.  Cardiovascular:     Rate and Rhythm: Normal rate and regular rhythm.     Pulses: Normal pulses.     Heart sounds: Normal heart sounds.  Pulmonary:     Effort: Pulmonary effort is normal.     Breath sounds: Normal breath sounds.  Abdominal:     Palpations: Abdomen is soft. There is no  mass.     Tenderness: There is no abdominal tenderness.  Musculoskeletal:     Right lower leg: No edema.     Left lower leg: No edema.  Neurological:     General: No focal deficit present.     Mental Status: He is alert and oriented to person, place, and time.  Psychiatric:        Mood and Affect: Mood normal.        Behavior: Behavior normal.      LABORATORY DATA:  I have reviewed the labs as listed.  CBC Latest Ref Rng & Units 09/09/2019 08/23/2019 08/07/2019  WBC 4.0 - 10.5 K/uL 7.6 6.0 6.8  Hemoglobin 13.0 - 17.0 g/dL 14.6 13.4 13.7  Hematocrit 39.0 - 52.0 % 44.4 40.8 40.8  Platelets 150 - 400 K/uL 189 201 235   CMP Latest Ref Rng & Units 09/09/2019 08/23/2019 08/07/2019  Glucose 70 - 99 mg/dL 135(H) 125(H) 246(H)  BUN 6 - 20 mg/dL 11 16 22(H)  Creatinine 0.61 - 1.24 mg/dL 0.97 0.83 1.01  Sodium 135 - 145 mmol/L 138 139 136  Potassium 3.5 - 5.1 mmol/L 3.9 3.8 3.2(L)  Chloride 98 - 111 mmol/L 104 108 104  CO2 22 - 32 mmol/L '23 23 24  ' Calcium 8.9 - 10.3 mg/dL 9.3 8.8(L) 8.7(L)  Total Protein 6.5 - 8.1 g/dL 7.5 6.6 7.5  Total Bilirubin 0.3 - 1.2 mg/dL 0.7 0.6 0.4  Alkaline Phos 38 - 126 U/L 51 44 58  AST 15 - 41 U/L '28 27 29  ' ALT 0 - 44 U/L 51(H) 49(H) 63(H)    DIAGNOSTIC IMAGING:  I have independently reviewed the scans and discussed with the patient. CT ABDOMEN PELVIS W CONTRAST  Result Date: 09/09/2019 CLINICAL DATA:  Right lower quadrant pain with bloody diarrhea for the past 2 days. History of sigmoid colon cancer status post initial partial colectomy in November 0263, complicated by recurrence requiring LAR in April 2021. EXAM: CT ABDOMEN AND PELVIS WITH CONTRAST TECHNIQUE: Multidetector CT imaging of the abdomen and pelvis was performed using the standard protocol following bolus administration of  intravenous contrast. CONTRAST:  130m OMNIPAQUE IOHEXOL 300 MG/ML  SOLN COMPARISON:  CT abdomen pelvis dated Aug 07, 2019. FINDINGS: Lower chest: No acute abnormality. Hepatobiliary:  Unchanged hepatic steatosis. No focal liver abnormality. The gallbladder is unremarkable. No biliary dilatation. Pancreas: Unremarkable. No pancreatic ductal dilatation or surrounding inflammatory changes. Spleen: Normal in size without focal abnormality. Adrenals/Urinary Tract: Adrenal glands are unremarkable. Kidneys are normal, without renal calculi, focal lesion, or hydronephrosis. Bladder is unremarkable. Stomach/Bowel: Postsurgical changes from low anterior resection. Previous area of 5.9 x 4.6 cm mass-like soft tissue thickening within the rectum appears decreased in size, chronic measuring 4.8 x 2.5 cm. The internal density of the mass-like area has also decreased, now measuring around 35 Hounsfield units, previously 60 Hounsfield units. Similar appearing perirectal stranding and scarring. No obstruction. Normal stomach. Normal appendix. Vascular/Lymphatic: No significant vascular findings are present. No enlarged abdominal or pelvic lymph nodes. Reproductive: Prostate is unremarkable. Other: No abdominal wall hernia or abnormality. No abdominopelvic ascites. No pneumoperitoneum. Musculoskeletal: No acute or significant osseous findings. IMPRESSION: 1. No acute intra-abdominal process. 2. Previous area of mass-like soft tissue thickening within the rectum appears decreased in size, with decreased internal density. While this could reflect resolving postsurgical intramural hematoma, treated residual tumor is not excluded. 3. Unchanged hepatic steatosis. Electronically Signed   By: WTitus DubinM.D.   On: 09/09/2019 13:50     ASSESSMENT:  1.  Stage III (PT3PN2B) sigmoid colon adenocarcinoma, MSI stable, status post sigmoid colon resection on 03/02/2018, 14/35 lymph nodes positive. -12 cycles of FOLFOX from 04/21/2018 through 10/06/2018. -Repeat colonoscopy on 06/01/2019 showed recurrence at the anastomotic area, biopsy consistent with adenocarcinoma. -PET scan on 07/05/2019 shows increased uptake in the  anastomotic area.  There are 2 small 5 mm nodules in the right upper lobe and left upper lobe which are new.  These are not hypermetabolic on PET scan given the small size.  Patient also had COVID-19 infection earlier this year. -LAR on 07/28/2019, pathology showing recurrent moderately differentiated adenocarcinoma, metastatic carcinoma in 3/6 lymph nodes, margins negative.  Grade 2.  RPT2, RPN1B. -MMR is preserved.   PLAN:  1. Recurrent sigmoid colon adenocarcinoma: -We reviewed results of MMR which shows preserved. -Reviewed CT of the abdomen and pelvis from 09/09/2019 when he went to the ER with rectal bleeding and abdominal pain.  No acute pathology was seen. -He is continuing Augmentin.  He has 2-3 loose stools per day.  He will complete Augmentin on 09/16/2019. -I have reached out to [facility]  They have a trial for resected stage III colon cancer with standard treatment with or without vitamin D. -[facility] -We do not have any clinical trials that fits him. -I have recommended standard treatment with XELOX for 3 months based on [employer] collaboration study. -We'll likely start his chemotherapy next week. -We have discussed side effects of Xeloda including hand-foot skin reactions.  We will likely start him on 1500 mg twice daily, 2 weeks on 1 week of.  Full dose is 2 g twice daily.  We'll see him 1 to 2 weeks after start of therapy.  2.  Peripheral neuropathy: -Neuropathy from previous chemotherapy has completely resolved.  3.  Elevated LFTs: -Mildly elevated ALT from fatty liver has been stable.   Orders placed this encounter:  No orders of the defined types were placed in this encounter.    SDerek Jack MD [phone]  I, DQuillian Quince  K[place], am acting as a scribe for Dr. Sanda Linger.  I, Derek Jack MD, have reviewed the above documentation for accuracy and completeness,  and I agree with the above.

## 2019-09-14 NOTE — Patient Instructions (Signed)
[place] at [facility] Discharge Instructions  You were seen today by Dr. Delton Coombes. He went over your recent results. Please continue your usual care with your primary care physician. Dr. Delton Coombes will see you back in for labs and follow up.   Thank you for choosing [place] at [facility] to provide your oncology and hematology care.  To afford each patient quality time with our provider, please arrive at least 15 minutes before your scheduled appointment time.   If you have a lab appointment with the [place] please come in thru the Main Entrance and check in at the main information desk  You need to re-schedule your appointment should you arrive 10 or more minutes late.  We strive to give you quality time with our providers, and arriving late affects you and other patients whose appointments are after yours.  Also, if you no show three or more times for appointments you may be dismissed from the clinic at the providers discretion.     Again, thank you for choosing [facility].  Our hope is that these requests will decrease the amount of time that you wait before being seen by our physicians.       _____________________________________________________________  Should you have questions after your visit to [facility], please contact our office at [phone] between the hours of 8:00 a.m. and 4:30 p.m.  Voicemails left after 4:00 p.m. will not be returned until the following business day.  For prescription refill requests, have your pharmacy contact our office and allow 72 hours.    Cancer Center Support Programs:   > Cancer Support Group  2nd Tuesday of the month 1pm-2pm, Journey Room

## 2019-09-16 ENCOUNTER — Other Ambulatory Visit (HOSPITAL_COMMUNITY): Payer: Self-pay | Admitting: *Deleted

## 2019-09-16 DIAGNOSIS — C187 Malignant neoplasm of sigmoid colon: Secondary | ICD-10-CM

## 2019-09-16 DIAGNOSIS — C186 Malignant neoplasm of descending colon: Secondary | ICD-10-CM

## 2019-09-16 MED ORDER — CAPECITABINE 500 MG PO TABS: 850.0000 mg/m2 | ORAL_TABLET | Freq: Two times a day (BID) | ORAL | 3 refills | Status: DC

## 2019-09-16 NOTE — Progress Notes (Signed)
ON PATHWAY REGIMEN - Colorectal  No Change  Continue With Treatment as Ordered.     A cycle is every 14 days:     Oxaliplatin      Leucovorin      5-Fluorouracil      5-Fluorouracil   **Always confirm dose/schedule in your pharmacy ordering system**  Patient Characteristics: Postoperative without Neoadjuvant Therapy (Pathologic Staging), Colon, Stage III, High Risk (pT4 or pN2) Therapeutic Status: Postoperative without Neoadjuvant Therapy (Pathologic Staging) Tumor Location: Colon AJCC M Category: cM0 AJCC T Category: pT3 AJCC N Category: pN2b AJCC 8 Stage Grouping: IIIC Intent of Therapy: Curative Intent, Discussed with Patient

## 2019-09-16 NOTE — Progress Notes (Signed)
DISCONTINUE ON PATHWAY REGIMEN - Colorectal     A cycle is every 14 days:     Oxaliplatin      Leucovorin      5-Fluorouracil      5-Fluorouracil   **Always confirm dose/schedule in your pharmacy ordering system**  REASON: Other Reason PRIOR TREATMENT: COS67: mFOLFOX6 q14 Days x 6 Months TREATMENT RESPONSE: N/A - Adjuvant Therapy  START ON PATHWAY REGIMEN - Colorectal     A cycle is every 21 days:     Capecitabine      Oxaliplatin   **Always confirm dose/schedule in your pharmacy ordering system**  Patient Characteristics: Postoperative without Neoadjuvant Therapy (Pathologic Staging), Colon, Stage III, Low Risk (pT1-3, pN1) Tumor Location: Colon Therapeutic Status: Postoperative without Neoadjuvant Therapy (Pathologic Staging) AJCC M Category: cM0 AJCC T Category: pT2 AJCC N Category: pN1b AJCC 8 Stage Grouping: IIIA Intent of Therapy: Curative Intent, Discussed with Patient

## 2019-09-17 ENCOUNTER — Telehealth (HOSPITAL_COMMUNITY): Payer: Self-pay | Admitting: Pharmacy Technician

## 2019-09-17 ENCOUNTER — Telehealth (HOSPITAL_COMMUNITY): Payer: Self-pay | Admitting: Pharmacist

## 2019-09-17 DIAGNOSIS — C186 Malignant neoplasm of descending colon: Secondary | ICD-10-CM

## 2019-09-17 DIAGNOSIS — C187 Malignant neoplasm of sigmoid colon: Secondary | ICD-10-CM

## 2019-09-17 MED ORDER — CAPECITABINE 500 MG PO TABS: 850.0000 mg/m2 | ORAL_TABLET | Freq: Two times a day (BID) | ORAL | 3 refills | Status: DC

## 2019-09-17 NOTE — Telephone Encounter (Signed)
Oral Chemotherapy Pharmacist Encounter   Spoke with Mr. Cutsforth and let him know about the Xeloda prescription being sent to [place]. He would like to use his [facility] benefit to fill the Xeloda. Prescription redirected to the [place]. He knows the [place] may take a while to process the prescription and he like will not be able to get his prescription from them in time to start with his Iv infusion on 6/18. He plans on getting the Xeloda from [facility] for the first cycle if there is no cost issue then, use the [place] for the remaining cycles.   Darl Pikes, PharmD, BCPS, BCOP, CPP Hematology/Oncology Clinical Pharmacist [facility] [phone]  09/17/2019 1:28 PM

## 2019-09-17 NOTE — Telephone Encounter (Signed)
Oral Chemotherapy Pharmacist Encounter  Patient Education I spoke with patient for overview of new oral chemotherapy medication: Xeloda for the adjuvant treatment of Colon Cancer in conjunction with IV Oxaliplatin, planned duration 3 months.   Pt is doing well. Counseled patient on administration, dosing, side effects, monitoring, drug-food interactions, safe handling, storage, and disposal. Patient will take 4 tablets (2,000 mg total) by mouth 2 (two) times daily after a meal. Take for 14 days, then hold for 7 days. Repeat every 21 days. - Patient will start at 3 tablets, then increase to 4 tablets as tolerated  Side effects include but not limited to: diarrhea, hand-foot syndrome, edema, decreased wbc, fatigue, N/V.    Reviewed with patient importance of keeping a medication schedule and plan for any missed doses.  Joel Jefferson voiced understanding and appreciation. All questions answered. Medication handout placed in the mail.  Provided patient with Oral [facility] phone number. Patient knows to call the office with questions or concerns. Oral Chemotherapy [facility] will continue to follow.  Darl Pikes, PharmD, BCPS, BCOP, CPP Hematology/Oncology Clinical Pharmacist Practitioner [facility] [phone]  09/17/2019 2:16 PM

## 2019-09-17 NOTE — Telephone Encounter (Signed)
Oral Oncology Patient Advocate Encounter  Test claim for Xeloda rejected at [facility].  Per insurance, patient must use [facility] to fill medication.  Prescription will be sent to [facility] and I will follow up for copay and any financial assistance that may be needed.   [place] Patient [place] Phone [phone] Fax [phone] 09/17/2019 9:49 AM

## 2019-09-17 NOTE — Telephone Encounter (Addendum)
Oral Chemotherapy Pharmacist Encounter  Due to insurance restriction the medication could not be filled at [facility]. Prescription has been e-scribed to [facility].  Supportive information was faxed to [facility]. We will continue to follow medication access.   Attempted to call patient to let him know, no answer, LVM for patient to call me back.   Darl Pikes, PharmD, BCPS, [facility] Hematology/Oncology Clinical Pharmacist [facility] [phone]  09/17/2019 11:40 AM

## 2019-09-17 NOTE — Telephone Encounter (Signed)
Oral Oncology Pharmacy Student Encounter  Received new prescription for Xeloda for the adjuvant treatment of Colon Cancer in conjunction with IV Oxaliplatin, planned duration 3 months.  Labs from 09/09/19 assessed, CBC, hepatic, and renal. No remarkable discovery with CBC. BUN and SrCr are within normal range. ALT is a bit elevated at 51 U/L. Based on this lab assessment, no dose adjustments are needed. Prescription dose and frequency assessed.   Current medication list in Epic reviewed, Xeloda DDIs with Pantoprozole identified:  - Pantoprozole: Proton Pump Inhibitors (PPI) may diminish the therapeutic effect of capecitabine. Recommend evaluating the need for a PPI/acid suppression. If acid suppression is needed, recommend switching to a H2 antagonist (eg, famotidine) to avoid this DDI.    Prescription has been e-scribed to the [facility] for benefits analysis and approval.  Oral Oncology Clinic will continue to follow for insurance authorization, copayment issues, initial counseling and start date.  Horton Finer, PharmD Candidate [facility] Oral Chemotherapy [facility] [phone]  09/17/2019 11:13 AM

## 2019-09-20 ENCOUNTER — Other Ambulatory Visit (HOSPITAL_COMMUNITY): Payer: Self-pay | Admitting: *Deleted

## 2019-09-20 DIAGNOSIS — C187 Malignant neoplasm of sigmoid colon: Secondary | ICD-10-CM

## 2019-09-20 MED ORDER — LIDOCAINE-PRILOCAINE 2.5-2.5 % EX CREA: TOPICAL_CREAM | CUTANEOUS | 3 refills | Status: DC

## 2019-09-21 ENCOUNTER — Other Ambulatory Visit (HOSPITAL_COMMUNITY): Payer: Self-pay | Admitting: *Deleted

## 2019-09-21 MED ORDER — ONDANSETRON HCL 8 MG PO TABS
8.0000 mg | ORAL_TABLET | Freq: Three times a day (TID) | ORAL | 2 refills | Status: DC | PRN
Start: 2019-09-21 — End: 2020-03-16

## 2019-09-21 NOTE — Telephone Encounter (Signed)
Patient called clinic asking for something to help with nausea for him to take before taking the xeloda that he starts on Friday.  He states that he took ativan in the past but does not want anything that strong.  He would like zofran.  I have sent that in to his pharmacy but advised that if it does not control his nausea to let us know.  He verbalizes understanding.

## 2019-09-21 NOTE — Telephone Encounter (Signed)
Per Candice at [facility], Xeloda is to be delivered today 09/21/19.  Patient had a $0.00 copay.

## 2019-09-21 NOTE — Progress Notes (Signed)
.   Pharmacist Chemotherapy Monitoring - Initial Assessment    Anticipated start date: 09/24/19   Regimen:  . Are orders appropriate based on the patient's diagnosis, regimen, and cycle? Yes . Does the plan date match the patient's scheduled date? Yes . Is the sequencing of drugs appropriate? Yes . Are the premedications appropriate for the patient's regimen? Yes . Prior Authorization for treatment is: Approved o If applicable, is the correct biosimilar selected based on the patient's insurance? yes  Organ Function and Labs: Marland Kitchen Are dose adjustments needed based on the patient's renal function, hepatic function, or hematologic function? No . Are appropriate labs ordered prior to the start of patient's treatment? Yes . Other organ system assessment, if indicated: N/A . The following baseline labs, if indicated, have been ordered: N/A  Dose Assessment: . Are the drug doses appropriate? Yes . Are the following correct: o Drug concentrations Yes o IV fluid compatible with drug Yes o Administration routes Yes o Timing of therapy Yes . If applicable, does the patient have documented access for treatment and/or plans for port-a-cath placement? not applicable . If applicable, have lifetime cumulative doses been properly documented and assessed? not applicable Lifetime Dose Tracking  . Oxaliplatin: 943.938 mg/m2 (2,095 mg) = 157.32 % of the maximum lifetime dose of 600 mg/m2  o   Toxicity Monitoring/Prevention: . The patient has the following take home antiemetics prescribed: N/A . The patient has the following take home medications prescribed: N/A . Medication allergies and previous infusion related reactions, if applicable, have been reviewed and addressed. Yes . The patient's current medication list has been assessed for drug-drug interactions with their chemotherapy regimen. no significant drug-drug interactions were identified on review.  Order Review: . Are the treatment plan orders  signed? No . Is the patient scheduled to see a provider prior to their treatment? Yes  . Need home anti-emetics sent to pharmacy.  I verify that I have reviewed each item in the above checklist and answered each question accordingly.  Wynona Neat 09/21/2019 4:19 PM

## 2019-09-23 ENCOUNTER — Encounter (HOSPITAL_COMMUNITY): Payer: Self-pay | Admitting: *Deleted

## 2019-09-24 ENCOUNTER — Inpatient Hospital Stay (HOSPITAL_COMMUNITY): Payer: No Typology Code available for payment source

## 2019-09-24 VITALS — BP 138/79 | HR 89 | Temp 97.8°F | Resp 18 | Wt 237.4 lb

## 2019-09-24 DIAGNOSIS — C187 Malignant neoplasm of sigmoid colon: Secondary | ICD-10-CM

## 2019-09-24 DIAGNOSIS — Z5111 Encounter for antineoplastic chemotherapy: Secondary | ICD-10-CM | POA: Diagnosis not present

## 2019-09-24 DIAGNOSIS — C186 Malignant neoplasm of descending colon: Secondary | ICD-10-CM

## 2019-09-24 LAB — CBC WITH DIFFERENTIAL/PLATELET
Abs Immature Granulocytes: 0.02 10*3/uL (ref 0.00–0.07)
Basophils Absolute: 0 10*3/uL (ref 0.0–0.1)
Basophils Relative: 1 %
Eosinophils Absolute: 0.4 10*3/uL (ref 0.0–0.5)
Eosinophils Relative: 6 %
HCT: 40 % (ref 39.0–52.0)
Hemoglobin: 13 g/dL (ref 13.0–17.0)
Immature Granulocytes: 0 %
Lymphocytes Relative: 25 %
Lymphs Abs: 1.4 10*3/uL (ref 0.7–4.0)
MCH: 27.8 pg (ref 26.0–34.0)
MCHC: 32.5 g/dL (ref 30.0–36.0)
MCV: 85.5 fL (ref 80.0–100.0)
Monocytes Absolute: 0.3 10*3/uL (ref 0.1–1.0)
Monocytes Relative: 5 %
Neutro Abs: 3.6 10*3/uL (ref 1.7–7.7)
Neutrophils Relative %: 63 %
Platelets: 178 10*3/uL (ref 150–400)
RBC: 4.68 MIL/uL (ref 4.22–5.81)
RDW: 13.5 % (ref 11.5–15.5)
WBC: 5.8 10*3/uL (ref 4.0–10.5)
nRBC: 0 % (ref 0.0–0.2)

## 2019-09-24 LAB — COMPREHENSIVE METABOLIC PANEL
ALT: 51 U/L — ABNORMAL HIGH (ref 0–44)
AST: 32 U/L (ref 15–41)
Albumin: 3.8 g/dL (ref 3.5–5.0)
Alkaline Phosphatase: 47 U/L (ref 38–126)
Anion gap: 12 (ref 5–15)
BUN: 16 mg/dL (ref 6–20)
CO2: 21 mmol/L — ABNORMAL LOW (ref 22–32)
Calcium: 8.8 mg/dL — ABNORMAL LOW (ref 8.9–10.3)
Chloride: 103 mmol/L (ref 98–111)
Creatinine, Ser: 1.02 mg/dL (ref 0.61–1.24)
GFR calc Af Amer: >60 mL/min (ref >60)
GFR calc non Af Amer: >60 mL/min (ref >60)
Glucose, Bld: 254 mg/dL — ABNORMAL HIGH (ref 70–99)
Potassium: 4 mmol/L (ref 3.5–5.1)
Sodium: 136 mmol/L (ref 135–145)
Total Bilirubin: 0.6 mg/dL (ref 0.3–1.2)
Total Protein: 6.3 g/dL — ABNORMAL LOW (ref 6.5–8.1)

## 2019-09-24 MED ORDER — SODIUM CHLORIDE 0.9 % IV SOLN
10.0000 mg | Freq: Once | INTRAVENOUS | Status: AC
  Administered 2019-09-24: 10 mg via INTRAVENOUS
  Filled 2019-09-24: qty 10

## 2019-09-24 MED ORDER — OXALIPLATIN CHEMO INJECTION 100 MG/20ML
130.0000 mg/m2 | Freq: Once | INTRAVENOUS | Status: AC
  Administered 2019-09-24: 300 mg via INTRAVENOUS
  Filled 2019-09-24: qty 60

## 2019-09-24 MED ORDER — SODIUM CHLORIDE 0.9% FLUSH
10.0000 mL | INTRAVENOUS | Status: DC | PRN
  Administered 2019-09-24: 10 mL

## 2019-09-24 MED ORDER — HEPARIN SOD (PORK) LOCK FLUSH 100 UNIT/ML IV SOLN
500.0000 [IU] | Freq: Once | INTRAVENOUS | Status: AC | PRN
  Administered 2019-09-24: 500 [IU]

## 2019-09-24 MED ORDER — PALONOSETRON HCL INJECTION 0.25 MG/5ML
INTRAVENOUS | Status: AC
  Filled 2019-09-24: qty 5

## 2019-09-24 MED ORDER — DEXTROSE 5 % IV SOLN: Freq: Once | INTRAVENOUS | Status: AC

## 2019-09-24 MED ORDER — PALONOSETRON HCL INJECTION 0.25 MG/5ML
0.2500 mg | Freq: Once | INTRAVENOUS | Status: AC
  Administered 2019-09-24: 0.25 mg via INTRAVENOUS

## 2019-09-24 NOTE — Progress Notes (Signed)
Chemotherapy consent signed.    Patient tolerated chemotherapy with no complaints voiced.  Side effects with management reviewed with understanding verbalized.  Port site clean and dry with no bruising or swelling noted at site.  Good blood return noted before and after administration of chemotherapy.  Band aid applied.  Patient left ambulatory with VSS and no s/s of distress noted.

## 2019-09-30 ENCOUNTER — Inpatient Hospital Stay (HOSPITAL_BASED_OUTPATIENT_CLINIC_OR_DEPARTMENT_OTHER): Payer: No Typology Code available for payment source | Admitting: Oncology

## 2019-09-30 ENCOUNTER — Encounter (HOSPITAL_COMMUNITY): Payer: Self-pay | Admitting: Oncology

## 2019-09-30 ENCOUNTER — Inpatient Hospital Stay (HOSPITAL_COMMUNITY): Payer: No Typology Code available for payment source

## 2019-09-30 ENCOUNTER — Other Ambulatory Visit: Payer: Self-pay

## 2019-09-30 VITALS — BP 118/72 | HR 94 | Temp 97.7°F | Resp 18 | Wt 227.2 lb

## 2019-09-30 DIAGNOSIS — G620 Drug-induced polyneuropathy: Secondary | ICD-10-CM | POA: Diagnosis not present

## 2019-09-30 DIAGNOSIS — Z1379 Encounter for other screening for genetic and chromosomal anomalies: Secondary | ICD-10-CM | POA: Diagnosis not present

## 2019-09-30 DIAGNOSIS — C187 Malignant neoplasm of sigmoid colon: Secondary | ICD-10-CM

## 2019-09-30 DIAGNOSIS — Z9049 Acquired absence of other specified parts of digestive tract: Secondary | ICD-10-CM

## 2019-09-30 DIAGNOSIS — R112 Nausea with vomiting, unspecified: Secondary | ICD-10-CM

## 2019-09-30 DIAGNOSIS — Z5111 Encounter for antineoplastic chemotherapy: Secondary | ICD-10-CM | POA: Diagnosis not present

## 2019-09-30 DIAGNOSIS — T451X5A Adverse effect of antineoplastic and immunosuppressive drugs, initial encounter: Secondary | ICD-10-CM

## 2019-09-30 LAB — COMPREHENSIVE METABOLIC PANEL
ALT: 98 U/L — ABNORMAL HIGH (ref 0–44)
AST: 43 U/L — ABNORMAL HIGH (ref 15–41)
Albumin: 3.9 g/dL (ref 3.5–5.0)
Alkaline Phosphatase: 43 U/L (ref 38–126)
Anion gap: 9 (ref 5–15)
BUN: 14 mg/dL (ref 6–20)
CO2: 21 mmol/L — ABNORMAL LOW (ref 22–32)
Calcium: 9 mg/dL (ref 8.9–10.3)
Chloride: 106 mmol/L (ref 98–111)
Creatinine, Ser: 0.95 mg/dL (ref 0.61–1.24)
GFR calc Af Amer: >60 mL/min (ref >60)
GFR calc non Af Amer: >60 mL/min (ref >60)
Glucose, Bld: 184 mg/dL — ABNORMAL HIGH (ref 70–99)
Potassium: 3.7 mmol/L (ref 3.5–5.1)
Sodium: 136 mmol/L (ref 135–145)
Total Bilirubin: 0.7 mg/dL (ref 0.3–1.2)
Total Protein: 6.9 g/dL (ref 6.5–8.1)

## 2019-09-30 LAB — CBC WITH DIFFERENTIAL/PLATELET
Abs Immature Granulocytes: 0.03 10*3/uL (ref 0.00–0.07)
Basophils Absolute: 0 10*3/uL (ref 0.0–0.1)
Basophils Relative: 1 %
Eosinophils Absolute: 0.3 10*3/uL (ref 0.0–0.5)
Eosinophils Relative: 4 %
HCT: 41.3 % (ref 39.0–52.0)
Hemoglobin: 13.8 g/dL (ref 13.0–17.0)
Immature Granulocytes: 0 %
Lymphocytes Relative: 26 %
Lymphs Abs: 2.1 10*3/uL (ref 0.7–4.0)
MCH: 27.5 pg (ref 26.0–34.0)
MCHC: 33.4 g/dL (ref 30.0–36.0)
MCV: 82.3 fL (ref 80.0–100.0)
Monocytes Absolute: 0.6 10*3/uL (ref 0.1–1.0)
Monocytes Relative: 7 %
Neutro Abs: 5.1 10*3/uL (ref 1.7–7.7)
Neutrophils Relative %: 62 %
Platelets: 169 10*3/uL (ref 150–400)
RBC: 5.02 MIL/uL (ref 4.22–5.81)
RDW: 12.7 % (ref 11.5–15.5)
WBC: 8.1 10*3/uL (ref 4.0–10.5)
nRBC: 0 % (ref 0.0–0.2)

## 2019-09-30 MED ORDER — LORAZEPAM 0.5 MG PO TABS: 0.5000 mg | ORAL_TABLET | Freq: Three times a day (TID) | ORAL | 2 refills | Status: DC

## 2019-09-30 MED ORDER — HEPARIN SOD (PORK) LOCK FLUSH 100 UNIT/ML IV SOLN
500.0000 [IU] | Freq: Once | INTRAVENOUS | Status: AC
  Administered 2019-09-30: 500 [IU] via INTRAVENOUS

## 2019-09-30 MED ORDER — PROMETHAZINE HCL 25 MG/ML IJ SOLN
12.5000 mg | Freq: Once | INTRAMUSCULAR | Status: AC
  Administered 2019-09-30: 12.5 mg via INTRAVENOUS
  Filled 2019-09-30: qty 1

## 2019-09-30 MED ORDER — SODIUM CHLORIDE 0.9 % IV SOLN: Freq: Once | INTRAVENOUS | Status: AC

## 2019-09-30 MED ORDER — ONDANSETRON HCL 4 MG/2ML IJ SOLN
8.0000 mg | Freq: Once | INTRAMUSCULAR | Status: AC
  Administered 2019-09-30: 8 mg via INTRAVENOUS
  Filled 2019-09-30: qty 4

## 2019-09-30 NOTE — Progress Notes (Signed)
Patient seen by [facility] PA. Per ATravis LPN/ TKefalas PA infuse 1 liter of Normal Saline over one hour. Administer 8mg  of Zofran IV x 1 dose, Phenergan 12.5mg  IV x 1 dose.   1 Liter of Normal Saline given today per MD orders. Tolerated infusion without adverse affects. Vital signs stable. No complaints at this time. Discharged from clinic ambulatory. F/U with [facility] as scheduled.

## 2019-09-30 NOTE — Patient Instructions (Signed)
[facility] at [facility]  Discharge Instructions:   _______________________________________________________________  Thank you for choosing [facility] at [facility] to provide your oncology and hematology care.  To afford each patient quality time with our providers, please arrive at least 15 minutes before your scheduled appointment.  You need to re-schedule your appointment if you arrive 10 or more minutes late.  We strive to give you quality time with our providers, and arriving late affects you and other patients whose appointments are after yours.  Also, if you no show three or more times for appointments you may be dismissed from the clinic.  Again, thank you for choosing [facility] at [facility]. Our hope is that these requests will allow you access to exceptional care and in a timely manner. _______________________________________________________________  If you have questions after your visit, please contact our office at [phone] between the hours of 8:30 a.m. and 5:00 p.m. Voicemails left after 4:30 p.m. will not be returned until the following business day. _______________________________________________________________  For prescription refill requests, have your pharmacy contact our office. _______________________________________________________________  Recommendations made by the consultant and any test results will be sent to your referring physician. _______________________________________________________________ 

## 2019-09-30 NOTE — Progress Notes (Signed)
Joel Sites, MD [address] [place] [address]  Malignant neoplasm of sigmoid colon [facility]) - Plan: DPD 5-Fluorouracil Toxicity, DPD 5-Fluorouracil Toxicity  S/P partial colectomy  Genetic testing  Peripheral neuropathy due to chemotherapy ([place])  Non-intractable vomiting with nausea, unspecified vomiting type - Plan: LORazepam (ATIVAN) 0.5 MG tablet   HISTORY OF PRESENT ILLNESS: Stage III (PT3PN2B) sigmoid colon adenocarcinoma, MSI stable, status post sigmoid colon resection on 03/02/2018, 14/35 lymph nodes positive. -12 cycles of FOLFOX from 04/21/2018 through 10/06/2018. -Repeat colonoscopy on 06/01/2019 showed recurrence at the anastomotic area, biopsy consistent with adenocarcinoma. -PET scan on 07/05/2019 shows increased uptake in the anastomotic area. There are 2 small 5 mm nodules in the right upper lobe and left upper lobe which are new. These are not hypermetabolic on PET scan given the small size. Patient also had COVID-19 infection earlier this year. -LAR on 07/28/2019, pathology showing recurrent moderately differentiated adenocarcinoma, metastatic carcinoma in 3/6 lymph nodes, margins negative.  Grade 2.  RPT2, RPN1B. -MMR is preserved. - Started XELOX therapy on 09/24/2019 x 3 months based upon IDEA collaboration study results. - Xeloda dose is 1500 mg BID 2 weeks on and 1 week off (Full dose is 2000 mg BID 2 weeks on and 1 week off- can consider dose titration based upon tolerability of cycle #1 and #2). - Xeloda dose placed on HOLD beginning on 09/30/2019 due to nausea/diarrhea/liver enzyme elevation after only being on medication x 1 week.  CURRENT STATUS: Joel Jefferson. 44 y.o. male returns for followup of colorectal cancer currently on XELOX therapy beginning on 09/24/2019.  He is here for tolerance check.  Unfortunately within 48 hours of starting therapy, he started to develop complications with treatment including nausea daily and diarrhea  (loose stools) greater than 5 within a 24-hour and resulting in him waking from sleep to move bowels.  He has used Imodium which has helped and actually caused constipation.  He discontinued Imodium and diarrhea recurred.  Fortunately he has not thrown up.  He currently is on day 7 of Xeloda therapy.  He started to experience cold intolerance secondary to oxaliplatin within 48 hours as well.  He has lost approximately 10 pounds over the last 7 days.  He reports an appetite but is having difficulty eating due to his nausea.  He is taking antiemetics at home that are mildly effective.  In the past he took Ativan which was the most effective antiemetic for him he reports.  He started treatment on last Friday and by Saturday evening he started to experience the above-mentioned symptoms.  He reports spending all of Sunday in bed due to not feeling well and he reports not feeling well since then.  Review of Systems  Constitutional: Positive for malaise/fatigue and weight loss. Negative for chills and fever.  HENT: Negative.   Eyes: Negative.   Respiratory: Negative.  Negative for cough.   Cardiovascular: Negative.  Negative for chest pain.  Gastrointestinal: Positive for constipation, diarrhea and nausea. Negative for blood in stool, melena and vomiting.  Genitourinary: Negative.   Musculoskeletal: Negative.   Skin: Negative.   Neurological: Positive for weakness.  Endo/Heme/Allergies: Negative.   Psychiatric/Behavioral: Negative.     Past Medical History:  Diagnosis Date  . Diabetes mellitus without complication ([place])   . Family history of breast cancer   . GERD (gastroesophageal reflux disease)   . History of kidney stones   . Hypercholesterolemia   . Hypertension   .  Urethral stricture      PHYSICAL EXAMINATION  ECOG PERFORMANCE STATUS: 1 - Symptomatic but completely ambulatory  Vitals:   09/30/19 1006  BP: 118/72  Pulse: 94  Resp: 18  Temp: 97.7 F (36.5 C)  SpO2: 98%     GENERAL:alert, no distress, well nourished, well developed, comfortable, cooperative and unaccompanied SKIN: no rashes or significant lesions HEAD: Normocephalic EYES: normal EARS: External ears normal OROPHARYNX: Not examined, mask in place NECK: supple LYMPH:  not examined BREAST:not examined LUNGS: clear to auscultation  HEART: regular rate & rhythm ABDOMEN:abdomen soft and normal bowel sounds BACK: Back symmetric, no curvature. EXTREMITIES:less then 2 second capillary refill, no edema, no skin discoloration, no cyanosis  NEURO: alert & oriented x 3 with fluent speech, no focal motor/sensory deficits   LABORATORY DATA: CBC    Component Value Date/Time   WBC 8.1 09/30/2019 1035   RBC 5.02 09/30/2019 1035   HGB 13.8 09/30/2019 1035   HCT 41.3 09/30/2019 1035   PLT 169 09/30/2019 1035   MCV 82.3 09/30/2019 1035   MCH 27.5 09/30/2019 1035   MCHC 33.4 09/30/2019 1035   RDW 12.7 09/30/2019 1035   LYMPHSABS 2.1 09/30/2019 1035   MONOABS 0.6 09/30/2019 1035   EOSABS 0.3 09/30/2019 1035   BASOSABS 0.0 09/30/2019 1035      Chemistry      Component Value Date/Time   NA 136 09/30/2019 1035   K 3.7 09/30/2019 1035   CL 106 09/30/2019 1035   CO2 21 (L) 09/30/2019 1035   BUN 14 09/30/2019 1035   CREATININE 0.95 09/30/2019 1035      Component Value Date/Time   CALCIUM 9.0 09/30/2019 1035   ALKPHOS 43 09/30/2019 1035   AST 43 (H) 09/30/2019 1035   ALT 98 (H) 09/30/2019 1035   BILITOT 0.7 09/30/2019 1035       RADIOGRAPHIC STUDIES:  CT ABDOMEN PELVIS W CONTRAST  Result Date: 09/09/2019 CLINICAL DATA:  Right lower quadrant pain with bloody diarrhea for the past 2 days. History of sigmoid colon cancer status post initial partial colectomy in November 4765, complicated by recurrence requiring LAR in April 2021. EXAM: CT ABDOMEN AND PELVIS WITH CONTRAST TECHNIQUE: Multidetector CT imaging of the abdomen and pelvis was performed using the standard protocol following bolus  administration of intravenous contrast. CONTRAST:  149m OMNIPAQUE IOHEXOL 300 MG/ML  SOLN COMPARISON:  CT abdomen pelvis dated Aug 07, 2019. FINDINGS: Lower chest: No acute abnormality. Hepatobiliary: Unchanged hepatic steatosis. No focal liver abnormality. The gallbladder is unremarkable. No biliary dilatation. Pancreas: Unremarkable. No pancreatic ductal dilatation or surrounding inflammatory changes. Spleen: Normal in size without focal abnormality. Adrenals/Urinary Tract: Adrenal glands are unremarkable. Kidneys are normal, without renal calculi, focal lesion, or hydronephrosis. Bladder is unremarkable. Stomach/Bowel: Postsurgical changes from low anterior resection. Previous area of 5.9 x 4.6 cm mass-like soft tissue thickening within the rectum appears decreased in size, chronic measuring 4.8 x 2.5 cm. The internal density of the mass-like area has also decreased, now measuring around 35 Hounsfield units, previously 60 Hounsfield units. Similar appearing perirectal stranding and scarring. No obstruction. Normal stomach. Normal appendix. Vascular/Lymphatic: No significant vascular findings are present. No enlarged abdominal or pelvic lymph nodes. Reproductive: Prostate is unremarkable. Other: No abdominal wall hernia or abnormality. No abdominopelvic ascites. No pneumoperitoneum. Musculoskeletal: No acute or significant osseous findings. IMPRESSION: 1. No acute intra-abdominal process. 2. Previous area of mass-like soft tissue thickening within the rectum appears decreased in size, with decreased internal density. While this could  reflect resolving postsurgical intramural hematoma, treated residual tumor is not excluded. 3. Unchanged hepatic steatosis. Electronically Signed   By: Titus Dubin M.D.   On: 09/09/2019 13:50       ASSESSMENT AND PLAN:  1. Malignant neoplasm of sigmoid colon ([place]) Stage III (PT3PN2B) sigmoid colon adenocarcinoma, MSI stable, status post sigmoid colon resection on  03/02/2018, 14/35 lymph nodes positive. -12 cycles of FOLFOX from 04/21/2018 through 10/06/2018. -Repeat colonoscopy on 06/01/2019 showed recurrence at the anastomotic area, biopsy consistent with adenocarcinoma. -PET scan on 07/05/2019 shows increased uptake in the anastomotic area. There are 2 small 5 mm nodules in the right upper lobe and left upper lobe which are new. These are not hypermetabolic on PET scan given the small size. Patient also had COVID-19 infection earlier this year. -LAR on 07/28/2019, pathology showing recurrent moderately differentiated adenocarcinoma, metastatic carcinoma in 3/6 lymph nodes, margins negative.  Grade 2.  RPT2, RPN1B. -MMR is preserved. - Started XELOX therapy on 09/24/2019 x 3 months based upon IDEA collaboration study results. - Xeloda dose is 1500 mg BID 2 weeks on and 1 week off (Full dose is 2000 mg BID 2 weeks on and 1 week off- can consider dose titration based upon tolerability of cycle #1 and #2). - Xeloda dose placed on HOLD beginning on 09/30/2019 due to nausea/diarrhea/liver enzyme elevation after only being on medication x 1 week.  Today is a tolerance check-up.  Unfortunately, he started Xeloda on 09/24/2019 and beginning on the evening of 09/25/2019 he developed loose stools and nausea that progressed and worsening the following days and since.  He has taken Imodium successfully with benefit, but he is still have 5+ stools (loose) in 24 hours and they are wakening from sleep multiple times throughout the night.  He is also experiencing Oxaliplatin-induced cold intolerance.  He has lost 10 lbs over the last 6 days due to his nausea and diarrhea.  He admits to an appetite, but does not have a desire to consume foods.  He denies severe GERD, mouth sores, and palmar-plantar erythrodysesthesia.  Labs today: CBC diff, CMET.  Blood counts today are all within normal limits and metabolic panel reveals a slight elevation of AST of 43 and mild elevation of ALT at  98.  Because of AST and ALT elevation not perfectly clear today but this could be secondary to oxaliplatin versus capecitabine.  We will see what happens in 1 week to his liver function panel as he will be 1 week off of Xeloda at that time.  I am surprised he is experiencing side effects so quickly in his first treatment.  He denies such problems with his past FOLFOX.  I have a low suspicion for DPD (dihydropyrimidine dehydrogenase) enzyme deficiency/mutation, but we will check this today in lab work.  Unfortunately his first week of therapy has been complicated by nausea and diarrhea as described above.  Will administer 1 L IVF with 8 mg of Zofran and 12.5 mg of Phenergan.  He has Ativan at home which is effective.  Due to his underlying diabetes, will avoid IV steroids today.  I have refilled his Ativan at home which he reports was effective for him with his last treatment.  He is under treatment with high risk medications requiring close monitoring.  I reviewed all of his imaging and documentation regarding his cancer care.  Return in 1 week for follow-up and to embark on C2 of therapy.   2. S/P partial colectomy As part of definitive  management for above-mentioned cancer  3. Genetic testing Negative  4. Peripheral neuropathy due to chemotherapy ([place]) Secondary to oxaliplatin therapy    ORDERS PLACED FOR THIS ENCOUNTER: Orders Placed This Encounter  Procedures  . DPD 5-Fluorouracil Toxicity    MEDICATIONS PRESCRIBED THIS ENCOUNTER: Meds ordered this encounter  Medications  . LORazepam (ATIVAN) 0.5 MG tablet    Sig: Take 1 tablet (0.5 mg total) by mouth every 8 (eight) hours.    Dispense:  30 tablet    Refill:  2    Order Specific Question:   Supervising Provider    Answer:   Derek Jack [[id number]]    All questions were answered. The patient knows to call the clinic with any problems, questions or concerns. We can certainly see the patient much sooner if  necessary.  Patient and plan discussed with Dr. Derek Jack and he is in agreement with the aforementioned.   This note is electronically signed by: Robynn Pane, PA-C 09/30/2019 4:28 PM

## 2019-09-30 NOTE — Patient Instructions (Signed)
[place] at [facility] Discharge Instructions  You were seen today by Kirby Crigler PA. He went over your recent lab results. He would like you to stop your Xeloda today, HOLD it until you see [place] again. He will see you back in 1 week for labs and follow up.   Thank you for choosing [place] at [facility] to provide your oncology and hematology care.  To afford each patient quality time with our provider, please arrive at least 15 minutes before your scheduled appointment time.   If you have a lab appointment with the [facility] please come in thru the  Main Entrance and check in at the main information desk  You need to re-schedule your appointment should you arrive 10 or more minutes late.  We strive to give you quality time with our providers, and arriving late affects you and other patients whose appointments are after yours.  Also, if you no show three or more times for appointments you may be dismissed from the clinic at the providers discretion.     Again, thank you for choosing [facility].  Our hope is that these requests will decrease the amount of time that you wait before being seen by our physicians.       _____________________________________________________________  Should you have questions after your visit to [facility], please contact our office at [phone] between the hours of 8:00 a.m. and 4:30 p.m.  Voicemails left after 4:00 p.m. will not be returned until the following business day.  For prescription refill requests, have your pharmacy contact our office and allow 72 hours.    Cancer Center Support Programs:   > Cancer Support Group  2nd Tuesday of the month 1pm-2pm, Journey Room

## 2019-10-06 LAB — DPD 5-FLUOROURACIL TOXICITY

## 2019-10-08 ENCOUNTER — Inpatient Hospital Stay (HOSPITAL_COMMUNITY): Payer: No Typology Code available for payment source

## 2019-10-08 ENCOUNTER — Inpatient Hospital Stay (HOSPITAL_COMMUNITY): Payer: No Typology Code available for payment source | Attending: Hematology | Admitting: Oncology

## 2019-10-08 ENCOUNTER — Encounter (HOSPITAL_COMMUNITY): Payer: Self-pay | Admitting: Oncology

## 2019-10-08 VITALS — BP 133/90 | HR 87 | Temp 97.3°F | Resp 18 | Wt 231.5 lb

## 2019-10-08 DIAGNOSIS — C187 Malignant neoplasm of sigmoid colon: Secondary | ICD-10-CM | POA: Diagnosis not present

## 2019-10-08 DIAGNOSIS — Z95828 Presence of other vascular implants and grafts: Secondary | ICD-10-CM

## 2019-10-08 DIAGNOSIS — R197 Diarrhea, unspecified: Secondary | ICD-10-CM | POA: Insufficient documentation

## 2019-10-08 DIAGNOSIS — R918 Other nonspecific abnormal finding of lung field: Secondary | ICD-10-CM | POA: Insufficient documentation

## 2019-10-08 DIAGNOSIS — Z5111 Encounter for antineoplastic chemotherapy: Secondary | ICD-10-CM | POA: Diagnosis present

## 2019-10-08 DIAGNOSIS — Z452 Encounter for adjustment and management of vascular access device: Secondary | ICD-10-CM | POA: Diagnosis not present

## 2019-10-08 DIAGNOSIS — R112 Nausea with vomiting, unspecified: Secondary | ICD-10-CM | POA: Diagnosis not present

## 2019-10-08 DIAGNOSIS — R7989 Other specified abnormal findings of blood chemistry: Secondary | ICD-10-CM | POA: Insufficient documentation

## 2019-10-08 DIAGNOSIS — G620 Drug-induced polyneuropathy: Secondary | ICD-10-CM | POA: Diagnosis not present

## 2019-10-08 LAB — CBC WITH DIFFERENTIAL/PLATELET
Abs Immature Granulocytes: 0.02 10*3/uL (ref 0.00–0.07)
Basophils Absolute: 0 10*3/uL (ref 0.0–0.1)
Basophils Relative: 1 %
Eosinophils Absolute: 0.4 10*3/uL (ref 0.0–0.5)
Eosinophils Relative: 5 %
HCT: 38.6 % — ABNORMAL LOW (ref 39.0–52.0)
Hemoglobin: 12.7 g/dL — ABNORMAL LOW (ref 13.0–17.0)
Immature Granulocytes: 0 %
Lymphocytes Relative: 27 %
Lymphs Abs: 1.9 10*3/uL (ref 0.7–4.0)
MCH: 27.4 pg (ref 26.0–34.0)
MCHC: 32.9 g/dL (ref 30.0–36.0)
MCV: 83.4 fL (ref 80.0–100.0)
Monocytes Absolute: 0.6 10*3/uL (ref 0.1–1.0)
Monocytes Relative: 8 %
Neutro Abs: 4.1 10*3/uL (ref 1.7–7.7)
Neutrophils Relative %: 59 %
Platelets: 217 10*3/uL (ref 150–400)
RBC: 4.63 MIL/uL (ref 4.22–5.81)
RDW: 13.9 % (ref 11.5–15.5)
WBC: 7 10*3/uL (ref 4.0–10.5)
nRBC: 0 % (ref 0.0–0.2)

## 2019-10-08 LAB — COMPREHENSIVE METABOLIC PANEL
ALT: 45 U/L — ABNORMAL HIGH (ref 0–44)
AST: 26 U/L (ref 15–41)
Albumin: 3.9 g/dL (ref 3.5–5.0)
Alkaline Phosphatase: 48 U/L (ref 38–126)
Anion gap: 10 (ref 5–15)
BUN: 5 mg/dL — ABNORMAL LOW (ref 6–20)
CO2: 23 mmol/L (ref 22–32)
Calcium: 8.5 mg/dL — ABNORMAL LOW (ref 8.9–10.3)
Chloride: 111 mmol/L (ref 98–111)
Creatinine, Ser: 0.83 mg/dL (ref 0.61–1.24)
GFR calc Af Amer: >60 mL/min (ref >60)
GFR calc non Af Amer: >60 mL/min (ref >60)
Glucose, Bld: 123 mg/dL — ABNORMAL HIGH (ref 70–99)
Potassium: 3.9 mmol/L (ref 3.5–5.1)
Sodium: 144 mmol/L (ref 135–145)
Total Bilirubin: 0.3 mg/dL (ref 0.3–1.2)
Total Protein: 6.4 g/dL — ABNORMAL LOW (ref 6.5–8.1)

## 2019-10-08 MED ORDER — HEPARIN SOD (PORK) LOCK FLUSH 100 UNIT/ML IV SOLN
500.0000 [IU] | Freq: Once | INTRAVENOUS | Status: AC
  Administered 2019-10-08: 500 [IU] via INTRAVENOUS

## 2019-10-08 MED ORDER — SODIUM CHLORIDE 0.9% FLUSH
10.0000 mL | INTRAVENOUS | Status: DC | PRN
  Administered 2019-10-08: 10 mL via INTRAVENOUS

## 2019-10-08 NOTE — Patient Instructions (Signed)
[place] at [facility] Discharge Instructions  You were seen today by Kirby Crigler PA. He went over your recent lab results. Restart your Xeloda on 10/15/19 take 3 pills in the morning and 2 pills at night. He will see you back in 1 week for labs, treatment and follow up.   Thank you for choosing [place] at [facility] to provide your oncology and hematology care.  To afford each patient quality time with our provider, please arrive at least 15 minutes before your scheduled appointment time.   If you have a lab appointment with the [facility] please come in thru the  Main Entrance and check in at the main information desk  You need to re-schedule your appointment should you arrive 10 or more minutes late.  We strive to give you quality time with our providers, and arriving late affects you and other patients whose appointments are after yours.  Also, if you no show three or more times for appointments you may be dismissed from the clinic at the providers discretion.     Again, thank you for choosing [facility].  Our hope is that these requests will decrease the amount of time that you wait before being seen by our physicians.       _____________________________________________________________  Should you have questions after your visit to [facility], please contact our office at [phone] between the hours of 8:00 a.m. and 4:30 p.m.  Voicemails left after 4:00 p.m. will not be returned until the following business day.  For prescription refill requests, have your pharmacy contact our office and allow 72 hours.    Cancer Center Support Programs:   > Cancer Support Group  2nd Tuesday of the month 1pm-2pm, Journey Room

## 2019-10-08 NOTE — Progress Notes (Signed)
Joel Sites, MD [address] [place] [address]  Malignant neoplasm of sigmoid colon [facility])   HISTORY OF PRESENT ILLNESS: Stage III (PT3PN2B) sigmoid colon adenocarcinoma, MSI stable, status post sigmoid colon resection on 03/02/2018, 14/35 lymph nodes positive. -12 cycles of FOLFOX from 04/21/2018 through 10/06/2018. -Repeat colonoscopy on 06/01/2019 showed recurrence at the anastomotic area, biopsy consistent with adenocarcinoma. -PET scan on 07/05/2019 shows increased uptake in the anastomotic area. There are 2 small 5 mm nodules in the right upper lobe and left upper lobe which are new. These are not hypermetabolic on PET scan given the small size. Patient also had COVID-19 infection earlier this year. -LAR on 07/28/2019, pathology showing recurrent moderately differentiated adenocarcinoma, metastatic carcinoma in 3/6 lymph nodes, margins negative.  Grade 2.  RPT2, RPN1B. -MMR is preserved. - Started XELOX therapy on 09/24/2019 x 3 months based upon [employer] collaboration study results. - Xeloda dose is 1500 mg BID 2 weeks on and 1 week off (Full dose is 2000 mg BID 2 weeks on and 1 week off- can consider dose titration based upon tolerability of cycle #1 and #2). - Xeloda dose placed on HOLD beginning on 09/30/2019 due to nausea/diarrhea/liver enzyme elevation after only being on medication x 1 week. -- Negative DPD testing on 09/30/2019. - Xeloda dose reduced to 1500 mg in AM and 1000 mg in PM 14 days on and 7 days off beginning on 10/15/2019 (cycle #2).  CURRENT STATUS: Joel Jefferson. 44 y.o. male returns for followup of colorectal cancer currently on XELOX therapy beginning on 09/24/2019.  He is due to embark on cycle #2 of therapy next week and today he returns for a tolerance check after holding Xeloda beginning last week due to nausea, diarrhea, and liver enzyme elevation.  He reports feeling significantly improved with 1 week break in Xeloda therapy.  I am fearful  that starting Xeloda with cycle #2 at the same dose we will continue to have problems with regards to tolerance.  As result we discussed dose reduction of Xeloda as described below.  He denies any new lumps or bumps on his examination.  No new pain.  Appetite is good and weight is stable.  Resolution of nausea and diarrhea.  No mouth sores or severe GERD.  No signs or symptoms suggestive of palmar-plantar erythrodysesthesia  Review of Systems  Constitutional: Negative for chills, fever, malaise/fatigue and weight loss.  HENT: Negative.   Eyes: Negative.   Respiratory: Negative.  Negative for cough.   Cardiovascular: Negative.  Negative for chest pain.  Gastrointestinal: Negative for blood in stool, constipation, diarrhea, melena, nausea and vomiting.  Genitourinary: Negative.   Musculoskeletal: Negative.   Skin: Negative.   Neurological: Negative for weakness.  Endo/Heme/Allergies: Negative.   Psychiatric/Behavioral: Negative.     Past Medical History:  Diagnosis Date  . Diabetes mellitus without complication ([place])   . Family history of breast cancer   . GERD (gastroesophageal reflux disease)   . History of kidney stones   . Hypercholesterolemia   . Hypertension   . Urethral stricture      PHYSICAL EXAMINATION  ECOG PERFORMANCE STATUS: 1 - Symptomatic but completely ambulatory  Vitals:   10/08/19 0832  BP: 133/90  Pulse: 87  Resp: 18  Temp: (!) 97.3 F (36.3 C)  SpO2: 99%    GENERAL:alert, no distress, well nourished, well developed, comfortable, cooperative and unaccompanied SKIN: no rashes or significant lesions HEAD: Normocephalic EYES: normal EARS: External  ears normal OROPHARYNX: Not examined, mask in place NECK: supple LYMPH:  not examined BREAST:not examined LUNGS: clear to auscultation  HEART: regular rate & rhythm ABDOMEN:abdomen soft and normal bowel sounds BACK: Back symmetric, no curvature. EXTREMITIES:less then 2 second capillary refill, no edema,  no skin discoloration, no cyanosis  NEURO: alert & oriented x 3 with fluent speech, no focal motor/sensory deficits   LABORATORY DATA: CBC    Component Value Date/Time   WBC 7.0 10/08/2019 0847   RBC 4.63 10/08/2019 0847   HGB 12.7 (L) 10/08/2019 0847   HCT 38.6 (L) 10/08/2019 0847   PLT 217 10/08/2019 0847   MCV 83.4 10/08/2019 0847   MCH 27.4 10/08/2019 0847   MCHC 32.9 10/08/2019 0847   RDW 13.9 10/08/2019 0847   LYMPHSABS 1.9 10/08/2019 0847   MONOABS 0.6 10/08/2019 0847   EOSABS 0.4 10/08/2019 0847   BASOSABS 0.0 10/08/2019 0847      Chemistry      Component Value Date/Time   NA 144 10/08/2019 0847   K 3.9 10/08/2019 0847   CL 111 10/08/2019 0847   CO2 23 10/08/2019 0847   BUN 5 (L) 10/08/2019 0847   CREATININE 0.83 10/08/2019 0847      Component Value Date/Time   CALCIUM 8.5 (L) 10/08/2019 0847   ALKPHOS 48 10/08/2019 0847   AST 26 10/08/2019 0847   ALT 45 (H) 10/08/2019 0847   BILITOT 0.3 10/08/2019 0847       RADIOGRAPHIC STUDIES:  CT ABDOMEN PELVIS W CONTRAST  Result Date: 09/09/2019 CLINICAL DATA:  Right lower quadrant pain with bloody diarrhea for the past 2 days. History of sigmoid colon cancer status post initial partial colectomy in November 8889, complicated by recurrence requiring LAR in April 2021. EXAM: CT ABDOMEN AND PELVIS WITH CONTRAST TECHNIQUE: Multidetector CT imaging of the abdomen and pelvis was performed using the standard protocol following bolus administration of intravenous contrast. CONTRAST:  146m OMNIPAQUE IOHEXOL 300 MG/ML  SOLN COMPARISON:  CT abdomen pelvis dated Aug 07, 2019. FINDINGS: Lower chest: No acute abnormality. Hepatobiliary: Unchanged hepatic steatosis. No focal liver abnormality. The gallbladder is unremarkable. No biliary dilatation. Pancreas: Unremarkable. No pancreatic ductal dilatation or surrounding inflammatory changes. Spleen: Normal in size without focal abnormality. Adrenals/Urinary Tract: Adrenal glands are  unremarkable. Kidneys are normal, without renal calculi, focal lesion, or hydronephrosis. Bladder is unremarkable. Stomach/Bowel: Postsurgical changes from low anterior resection. Previous area of 5.9 x 4.6 cm mass-like soft tissue thickening within the rectum appears decreased in size, chronic measuring 4.8 x 2.5 cm. The internal density of the mass-like area has also decreased, now measuring around 35 Hounsfield units, previously 60 Hounsfield units. Similar appearing perirectal stranding and scarring. No obstruction. Normal stomach. Normal appendix. Vascular/Lymphatic: No significant vascular findings are present. No enlarged abdominal or pelvic lymph nodes. Reproductive: Prostate is unremarkable. Other: No abdominal wall hernia or abnormality. No abdominopelvic ascites. No pneumoperitoneum. Musculoskeletal: No acute or significant osseous findings. IMPRESSION: 1. No acute intra-abdominal process. 2. Previous area of mass-like soft tissue thickening within the rectum appears decreased in size, with decreased internal density. While this could reflect resolving postsurgical intramural hematoma, treated residual tumor is not excluded. 3. Unchanged hepatic steatosis. Electronically Signed   By: WTitus DubinM.D.   On: 09/09/2019 13:50       ASSESSMENT AND PLAN:  1. Malignant ne[place] of sigmoid colon ([place] Stage III (PT3PN2B) sigmoid colon adenocarcinoma, MSI stable, status post sigmoid colon resection on 03/02/2018, 14/35 lymph nodes positive. -12 cycles  of FOLFOX from 04/21/2018 through 10/06/2018. -Repeat colonoscopy on 06/01/2019 showed recurrence at the anastomotic area, biopsy consistent with adenocarcinoma. -PET scan on 07/05/2019 shows increased uptake in the anastomotic area. There are 2 small 5 mm nodules in the right upper lobe and left upper lobe which are new. These are not hypermetabolic on PET scan given the small size. Patient also had COVID-19 infection earlier this year. -LAR on  07/28/2019, pathology showing recurrent moderately differentiated adenocarcinoma, metastatic carcinoma in 3/6 lymph nodes, margins negative.  Grade 2.  RPT2, RPN1B. -MMR is preserved. - Started XELOX therapy on 09/24/2019 x 3 months based upon [employer] collaboration study results. - Xeloda dose is 1500 mg BID 2 weeks on and 1 week off (Full dose is 2000 mg BID 2 weeks on and 1 week off- can consider dose titration based upon tolerability of cycle #1 and #2). - Xeloda dose placed on HOLD beginning on 09/30/2019 due to nausea/diarrhea/liver enzyme elevation after only being on medication x 1 week.   - Negative DPD testing on 09/30/2019. - Xeloda dose reduced to 1500 mg in AM and 1000 mg in PM 14 days on and 7 days off beginning on 10/15/2019 (cycle #2).  Today is a tolerance check-up.  He is much improved with 1 week break from Xeloda therapy.  nausea and diarrhea are resolved.  Labs today: CBC diff, CMET.  WBC 7.0 with normal differential, hemoglobin 12.7 g/dL, platelet count 217,000.  Metabolic panel demonstrates a mild hypocalcemia with hypoproteinemia, ALT improvement to 45 (compared to 98), and normalization of AST at 26.  He is due to embark on cycle #2 next week.  We will continue with same dose of Oxaliplatin and further reduce his Xeloda to 1500 mg in AM and 1000 mg in PM (14 days on and 7 days off).  He will start this new Xeloda dose on 10/15/2019.  He is under treatment with high risk medications requiring close monitoring.  I reviewed all of his imaging and documentation regarding his cancer care.  He will return in 10/15/2019 to embark on cycle 2 with above mentioned dose reduction in Xeloda.  He is a concerned about his upcoming vacation at the beginning on 11/2019.  Depending on his tolerance to C2 of therapy, decisions can be made about treatment alteration for C3 to accommodate his vacation.  He is advised that staying on time with his treatment is very important for cure of disease.  If he  tolerates his C2 of therapy, he will consider moving forward with C3 as scheduled.  2. S/P partial colectomy As part of definitive management for above-mentioned cancer  3. Genetic testing Negative  4. Peripheral neuropathy due to chemotherapy ([place]) Secondary to oxaliplatin therapy, stable.    ORDERS PLACED FOR THIS ENCOUNTER: No orders of the defined types were placed in this encounter.   MEDICATIONS PRESCRIBED THIS ENCOUNTER: No orders of the defined types were placed in this encounter.   All questions were answered. The patient knows to call the clinic with any problems, questions or concerns. We can certainly see the patient much sooner if necessary.  Patient and plan discussed with Dr. Derek Jack and he is in agreement with the aforementioned.   This note is electronically signed by: Robynn Pane, PA-C 10/08/2019 9:31 AM

## 2019-10-15 ENCOUNTER — Inpatient Hospital Stay (HOSPITAL_COMMUNITY): Payer: No Typology Code available for payment source

## 2019-10-15 ENCOUNTER — Other Ambulatory Visit: Payer: Self-pay

## 2019-10-15 ENCOUNTER — Inpatient Hospital Stay (HOSPITAL_BASED_OUTPATIENT_CLINIC_OR_DEPARTMENT_OTHER): Payer: No Typology Code available for payment source | Admitting: Nurse Practitioner

## 2019-10-15 ENCOUNTER — Encounter (HOSPITAL_COMMUNITY): Payer: Self-pay | Admitting: Nurse Practitioner

## 2019-10-15 VITALS — BP 159/87 | HR 98 | Temp 97.3°F | Resp 18

## 2019-10-15 DIAGNOSIS — C187 Malignant neoplasm of sigmoid colon: Secondary | ICD-10-CM | POA: Diagnosis not present

## 2019-10-15 DIAGNOSIS — C186 Malignant neoplasm of descending colon: Secondary | ICD-10-CM

## 2019-10-15 DIAGNOSIS — Z5111 Encounter for antineoplastic chemotherapy: Secondary | ICD-10-CM | POA: Diagnosis not present

## 2019-10-15 LAB — COMPREHENSIVE METABOLIC PANEL
ALT: 60 U/L — ABNORMAL HIGH (ref 0–44)
AST: 34 U/L (ref 15–41)
Albumin: 3.7 g/dL (ref 3.5–5.0)
Alkaline Phosphatase: 50 U/L (ref 38–126)
Anion gap: 9 (ref 5–15)
BUN: 10 mg/dL (ref 6–20)
CO2: 22 mmol/L (ref 22–32)
Calcium: 8.6 mg/dL — ABNORMAL LOW (ref 8.9–10.3)
Chloride: 107 mmol/L (ref 98–111)
Creatinine, Ser: 0.87 mg/dL (ref 0.61–1.24)
GFR calc Af Amer: >60 mL/min (ref >60)
GFR calc non Af Amer: >60 mL/min (ref >60)
Glucose, Bld: 222 mg/dL — ABNORMAL HIGH (ref 70–99)
Potassium: 3.9 mmol/L (ref 3.5–5.1)
Sodium: 138 mmol/L (ref 135–145)
Total Bilirubin: 0.4 mg/dL (ref 0.3–1.2)
Total Protein: 6.6 g/dL (ref 6.5–8.1)

## 2019-10-15 LAB — CBC WITH DIFFERENTIAL/PLATELET
Abs Immature Granulocytes: 0.03 10*3/uL (ref 0.00–0.07)
Basophils Absolute: 0.1 10*3/uL (ref 0.0–0.1)
Basophils Relative: 1 %
Eosinophils Absolute: 0.3 10*3/uL (ref 0.0–0.5)
Eosinophils Relative: 5 %
HCT: 41.5 % (ref 39.0–52.0)
Hemoglobin: 13.7 g/dL (ref 13.0–17.0)
Immature Granulocytes: 1 %
Lymphocytes Relative: 29 %
Lymphs Abs: 1.9 10*3/uL (ref 0.7–4.0)
MCH: 27.8 pg (ref 26.0–34.0)
MCHC: 33 g/dL (ref 30.0–36.0)
MCV: 84.3 fL (ref 80.0–100.0)
Monocytes Absolute: 0.3 10*3/uL (ref 0.1–1.0)
Monocytes Relative: 5 %
Neutro Abs: 3.8 10*3/uL (ref 1.7–7.7)
Neutrophils Relative %: 59 %
Platelets: 191 10*3/uL (ref 150–400)
RBC: 4.92 MIL/uL (ref 4.22–5.81)
RDW: 14.6 % (ref 11.5–15.5)
WBC: 6.4 10*3/uL (ref 4.0–10.5)
nRBC: 0 % (ref 0.0–0.2)

## 2019-10-15 MED ORDER — HEPARIN SOD (PORK) LOCK FLUSH 100 UNIT/ML IV SOLN
500.0000 [IU] | Freq: Once | INTRAVENOUS | Status: AC | PRN
  Administered 2019-10-15: 500 [IU]

## 2019-10-15 MED ORDER — SODIUM CHLORIDE 0.9% FLUSH
10.0000 mL | INTRAVENOUS | Status: DC | PRN
  Administered 2019-10-15: 10 mL

## 2019-10-15 MED ORDER — LORAZEPAM 2 MG/ML IJ SOLN
1.0000 mg | Freq: Once | INTRAMUSCULAR | Status: AC
  Administered 2019-10-15: 1 mg via INTRAVENOUS

## 2019-10-15 MED ORDER — PALONOSETRON HCL INJECTION 0.25 MG/5ML
0.2500 mg | Freq: Once | INTRAVENOUS | Status: AC
  Administered 2019-10-15: 0.25 mg via INTRAVENOUS
  Filled 2019-10-15: qty 5

## 2019-10-15 MED ORDER — OXALIPLATIN CHEMO INJECTION 100 MG/20ML
130.0000 mg/m2 | Freq: Once | INTRAVENOUS | Status: AC
  Administered 2019-10-15: 300 mg via INTRAVENOUS
  Filled 2019-10-15: qty 60

## 2019-10-15 MED ORDER — SODIUM CHLORIDE 0.9 % IV SOLN
10.0000 mg | Freq: Once | INTRAVENOUS | Status: AC
  Administered 2019-10-15: 10 mg via INTRAVENOUS
  Filled 2019-10-15: qty 10

## 2019-10-15 MED ORDER — LORAZEPAM 2 MG/ML IJ SOLN
INTRAMUSCULAR | Status: AC
  Filled 2019-10-15: qty 1

## 2019-10-15 MED ORDER — SODIUM CHLORIDE 0.9% FLUSH
10.0000 mL | INTRAVENOUS | Status: DC | PRN
  Administered 2019-10-15: 10 mL via INTRAVENOUS

## 2019-10-15 MED ORDER — DEXTROSE 5 % IV SOLN: Freq: Once | INTRAVENOUS | Status: AC

## 2019-10-15 NOTE — Progress Notes (Signed)
Labs reviewed today with Francene Finders NP. Will proceed with treatment today as planned. Same doses as last time.   Patient is taking capecitabine and has not missed any doses and reports no side effects at this time.   Treatment given per orders. Patient tolerated it well without problems. Vitals stable and discharged home from clinic ambulatory. Follow up as scheduled.

## 2019-10-15 NOTE — Patient Instructions (Signed)
[facility] Discharge Instructions for Patients Receiving Chemotherapy  Today you received the following chemotherapy agents   To help prevent nausea and vomiting after your treatment, we encourage you to take your nausea medication   If you develop nausea and vomiting that is not controlled by your nausea medication, call the clinic.   BELOW ARE SYMPTOMS THAT SHOULD BE REPORTED IMMEDIATELY:  *FEVER GREATER THAN 100.5 F  *CHILLS WITH OR WITHOUT FEVER  NAUSEA AND VOMITING THAT IS NOT CONTROLLED WITH YOUR NAUSEA MEDICATION  *UNUSUAL SHORTNESS OF BREATH  *UNUSUAL BRUISING OR BLEEDING  TENDERNESS IN MOUTH AND THROAT WITH OR WITHOUT PRESENCE OF ULCERS  *URINARY PROBLEMS  *BOWEL PROBLEMS  UNUSUAL RASH Items with * indicate a potential emergency and should be followed up as soon as possible.  Feel free to call the clinic should you have any questions or concerns. The clinic phone number is [phone].  Please show the CHEMO ALERT CARD at check-in to the Emergency Department and triage nurse.   

## 2019-10-15 NOTE — Patient Instructions (Signed)
[facility]  Discharge Instructions:  You saw Randi Lockamy, NP, today. _______________________________________________________________  Thank you for choosing [facility] at [facility] to provide your oncology and hematology care.  To afford each patient quality time with our providers, please arrive at least 15 minutes before your scheduled appointment.  You need to re-schedule your appointment if you arrive 10 or more minutes late.  We strive to give you quality time with our providers, and arriving late affects you and other patients whose appointments are after yours.  Also, if you no show three or more times for appointments you may be dismissed from the clinic.  Again, thank you for choosing [facility] at [facility]. Our hope is that these requests will allow you access to exceptional care and in a timely manner. _______________________________________________________________  If you have questions after your visit, please contact our office at [phone] between the hours of 8:30 a.m. and 5:00 p.m. Voicemails left after 4:30 p.m. will not be returned until the following business day. _______________________________________________________________  For prescription refill requests, have your pharmacy contact our office. _______________________________________________________________  Recommendations made by the consultant and any test results will be sent to your referring physician. _______________________________________________________________ 

## 2019-10-15 NOTE — Assessment & Plan Note (Addendum)
1.  Stage III sigmoid colon adenocarcinoma: -MSI stable, status post sigmoid colon resection on 03/02/2018, 14/35 lymph nodes positive. -12 cycles of FOLFOX from 04/21/2018 through 10/06/2018. -Repeat colonoscopy on 06/01/2019 showed recurrence at the anastomotic area, biopsy consistent with adenocarcinoma. -PET scan on 07/05/2019 shows increased uptake in the anastomotic area.  There are 2 small 5 mm nodules in the right upper lobe and left upper lobe which are new.  These are not hypermetabolic on PET scan given the small size.  Patient also had COVID-19 infection earlier this year. -We reviewed the pathology from 07/28/2018 which showed recurrent moderately differentiated adenocarcinoma, metastatic carcinoma in 3/6 lymph nodes.  Margins are negative.  Grade 2, RPT 2, RpN1B. -MMR is preserved. -Started Xeloda therapy on 618 2021 x 3 months based upon idea collaboration study results. -Xeloda dose is 1500 mg twice daily 2 weeks on and 1 week off (full dose is 2000 mg twice daily 2 weeks on and 1 week off-can consider dose titration based upon tolerability of the cycle 1 and 2) -Xeloda dose placed on hold beginning 09/30/2019 due to nausea/diarrhea/liver enzyme elevation only after being on medication for 1 week. -Negative DPD testing on 09/30/2019. -Xeloda dose reduced to 1500 mg in a.m. and 1000 mg in the p.m. 14 days on and 7 days off beginning on 10/15/2019 (cycle 2). -He starts his 3rd cycle of Xeloda today 10/15/2019 and will continue it for the next 14 days at the reduced dose. -Labs done on 10/15/2019 showed AST 34, ALT 60, WBC 6.4, hemoglobin 13.7, platelets 191 -He will follow-up next week with labs.  2.  Peripheral neuropathy: -She had some numbness in the fingertips and toes which is completely resolved at this time  3.  Elevated LFTs: -She has mildly elevated ALT which is likely from fatty liver noted on previous scans. -Labs done on 10/15/2019 showed AST 34, ALT 60

## 2019-10-15 NOTE — Progress Notes (Signed)
Joel Jefferson, [place] [address]   CLINIC:  Medical Oncology/Hematology  PCP:  Joel Jefferson, [place] [address] [phone]   REASON FOR VISIT: Follow-up for recurrent colon cancer   CURRENT THERAPY: Xeloda and oxaliplatin  BRIEF ONCOLOGIC HISTORY:  Oncology History  Malignant neoplasm of sigmoid colon [facility])   Initial Diagnosis   Cancer of sigmoid colon ([place])   03/27/2018 Genetic Testing   ALK c.350C>G VUS identified on the multicancer panel.  The Multi-Gene Panel offered by [facility] includes sequencing and/or deletion duplication testing of the following 85 genes: AIP, ALK, APC, ATM, AXIN2,BAP1,  BARD1, BLM, BMPR1A, BRCA1, BRCA2, BRIP1, CASR, CDC73, CDH1, CDK4, CDKN1B, CDKN1C, CDKN2A (p14ARF), CDKN2A (p16INK4a), CEBPA, CHEK2, CTNNA1, DICER1, DIS3L2, EGFR (c.2369C>T, p.Thr790Met variant only), EPCAM (Deletion/duplication testing only), FH, FLCN, GATA2, GPC3, GREM1 (Promoter region deletion/duplication testing only), HOXB13 (c.251G>A, p.Gly84Glu), HRAS, KIT, MAX, MEN1, MET, MITF (c.952G>A, p.Glu318Lys variant only), MLH1, MSH2, MSH3, MSH6, MUTYH, NBN, NF1, NF2, NTHL1, PALB2, PDGFRA, PHOX2B, PMS2, POLD1, POLE, POT1, PRKAR1A, PTCH1, PTEN, RAD50, RAD51C, RAD51D, RB1, RECQL4, RET, RNF43, RUNX1, SDHAF2, SDHA (sequence changes only), SDHB, SDHC, SDHD, SMAD4, SMARCA4, SMARCB1, SMARCE1, STK11, SUFU, TERC, TERT, TMEM127, TP53, TSC1, TSC2, VHL, WRN and WT1.   The report date is 03/27/2018.   04/21/2018 - 10/08/2018 Chemotherapy   The patient had palonosetron (ALOXI) injection 0.25 mg, 0.25 mg, Intravenous,  Once, 13 of 13 cycles Administration: 0.25 mg (04/21/2018), 0.25 mg (05/05/2018), 0.25 mg (05/19/2018), 0.25 mg (06/02/2018), 0.25 mg (06/17/2018), 0.25 mg (06/30/2018), 0.25 mg (07/13/2018), 0.25 mg (08/11/2018), 0.25 mg (08/25/2018), 0.25 mg (09/08/2018), 0.25 mg (09/22/2018), 0.25 mg (10/06/2018) leucovorin 800 mg in dextrose 5 % 250 mL infusion,  876 mg, Intravenous,  Once, 13 of 13 cycles Administration: 800 mg (04/21/2018), 900 mg (05/05/2018), 800 mg (05/19/2018), 800 mg (06/02/2018), 900 mg (06/17/2018), 900 mg (06/30/2018), 900 mg (07/13/2018), 900 mg (08/11/2018), 900 mg (08/25/2018), 876 mg (09/22/2018), 900 mg (09/08/2018), 876 mg (10/06/2018) oxaliplatin (ELOXATIN) 185 mg in dextrose 5 % 500 mL chemo infusion, 85 mg/m2 = 185 mg, Intravenous,  Once, 13 of 13 cycles Dose modification: 68 mg/m2 (80 % of original dose 85 mg/m2, Cycle 12, Reason: Other (see comments), Comment: neuropathy), 42.5 mg/m2 (50 % of original dose 85 mg/m2, Cycle 13, Reason: Provider Judgment) Administration: 185 mg (04/21/2018), 185 mg (05/05/2018), 185 mg (05/19/2018), 185 mg (06/02/2018), 185 mg (06/17/2018), 185 mg (06/30/2018), 185 mg (07/13/2018), 185 mg (08/11/2018), 185 mg (08/25/2018), 185 mg (09/08/2018), 150 mg (09/22/2018), 95 mg (10/06/2018) fluorouracil (ADRUCIL) chemo injection 900 mg, 400 mg/m2 = 900 mg, Intravenous,  Once, 13 of 13 cycles Administration: 900 mg (04/21/2018), 900 mg (05/05/2018), 900 mg (05/19/2018), 900 mg (06/02/2018), 900 mg (06/17/2018), 900 mg (06/30/2018), 900 mg (07/13/2018), 900 mg (08/11/2018), 900 mg (08/25/2018), 900 mg (09/08/2018), 900 mg (09/22/2018), 900 mg (10/06/2018) fosaprepitant (EMEND) 150 mg, dexamethasone (DECADRON) 12 mg in sodium chloride 0.9 % 145 mL IVPB, , Intravenous,  Once, 10 of 10 cycles Administration:  (06/02/2018),  (06/17/2018),  (06/30/2018),  (07/13/2018),  (08/11/2018),  (08/25/2018),  (09/08/2018),  (09/22/2018),  (10/06/2018) fluorouracil (ADRUCIL) 5,250 mg in sodium chloride 0.9 % 145 mL chemo infusion, 2,400 mg/m2 = 5,250 mg, Intravenous, 1 Day/Dose, 13 of 13 cycles Administration: 5,250 mg (04/21/2018), 5,250 mg (05/05/2018), 5,250 mg (05/19/2018), 5,250 mg (06/02/2018), 5,250 mg (06/17/2018), 5,250 mg (06/30/2018), 5,250 mg (07/13/2018), 5,250 mg (08/11/2018), 5,250 mg (08/25/2018), 5,250 mg (09/08/2018), 5,250 mg (09/22/2018), 5,250 mg (10/06/2018)  for  chemotherapy  treatment.    09/24/2019 -  Chemotherapy   The patient had capecitabine (XELODA) 500 MG tablet, 850 mg/m2 = 2,000 mg, Oral, 2 times daily after meals, 1 of 1 cycle, Start date: 09/17/2019, End date: -- palonosetron (ALOXI) injection 0.25 mg, 0.25 mg, Intravenous,  Once, 1 of 4 cycles Administration: 0.25 mg (09/24/2019) oxaliplatin (ELOXATIN) 300 mg in dextrose 5 % 500 mL chemo infusion, 130 mg/m2 = 300 mg, Intravenous,  Once, 1 of 4 cycles Administration: 300 mg (09/24/2019)  for chemotherapy treatment.    Malignant neoplasm of descending colon ([place])  07/28/2019 Initial Diagnosis   Malignant neoplasm of descending colon ([place])   09/24/2019 -  Chemotherapy   The patient had capecitabine (XELODA) 500 MG tablet, 850 mg/m2 = 2,000 mg, Oral, 2 times daily after meals, 1 of 1 cycle, Start date: 09/17/2019, End date: -- palonosetron (ALOXI) injection 0.25 mg, 0.25 mg, Intravenous,  Once, 1 of 4 cycles Administration: 0.25 mg (09/24/2019) oxaliplatin (ELOXATIN) 300 mg in dextrose 5 % 500 mL chemo infusion, 130 mg/m2 = 300 mg, Intravenous,  Once, 1 of 4 cycles Administration: 300 mg (09/24/2019)  for chemotherapy treatment.       INTERVAL HISTORY:  Joel Jefferson 44 y.o. male returns for routine follow-up for recurrent colon cancer.  Patient reports he feels fine when he is not taking the Xeloda.  However he starts the Xeloda back tonight.  He usually has nausea and diarrhea that starts a few days after.  He will call us if he has any issues.  He denies any rash on his hands mouth or feet. Denies any nausea, vomiting, or diarrhea. Denies any new pains. Had not noticed any recent bleeding such as epistaxis, hematuria or hematochezia. Denies recent chest pain on exertion, shortness of breath on minimal exertion, pre-syncopal episodes, or palpitations. Denies any numbness or tingling in hands or feet. Denies any recent fevers, infections, or recent hospitalizations. Patient reports appetite at 75% and  energy level at 75%.  He is eating well maintain his weight this time.     REVIEW OF SYSTEMS:  Review of Systems  Neurological: Positive for numbness.  All other systems reviewed and are negative.    PAST MEDICAL/SURGICAL HISTORY:  Past Medical History:  Diagnosis Date  . Diabetes mellitus without complication ([place])   . Family history of breast cancer   . GERD (gastroesophageal reflux disease)   . History of kidney stones   . Hypercholesterolemia   . Hypertension   . Urethral stricture    Past Surgical History:  Procedure Laterality Date  . BALLOON DILATION  04/06/2012   Procedure: BALLOON DILATION;  Surgeon: Reece Packer, MD;  Location[facility];  Service: Urology;  Laterality: N/A;  . BIOPSY  02/24/2018   Procedure: BIOPSY;  Surgeon: Danie Binder, MD;  [facility];  Service: Endoscopy;;  colon  . BIOPSY  06/01/2019   Procedure: BIOPSY;  Surgeon: Danie Binder, MD;  [facility];  Service: Endoscopy;;  . BOWEL RESECTION N/A 07/28/2019   Procedure: LOW ANTERIOR BOWEL RESECTION;  Surgeon: Aviva Signs, MD;  Location: [facility];  Service: General;  Laterality: N/A;  . COLONOSCOPY WITH PROPOFOL N/A 02/24/2018   Dr. Oneida Alar: fungating, infiltrative partially obstructing mass in sigmoid colon, area tattooed. ext/int hemorrhoids. Bx: adenocarcinoma  . COLONOSCOPY WITH PROPOFOL N/A 06/01/2019   Procedure: COLONOSCOPY WITH PROPOFOL;  Surgeon: Danie Binder, MD;  [facility];  Service: Endoscopy;  Laterality: N/A;  9:30am  . CYSTOSCOPY WITH HOLMIUM LASER LITHOTRIPSY  08/07/2019   Procedure: CYSTOSCOPY WITH HOLMIUM LASER LITHOTRIPSY;  Surgeon: Festus Aloe, MD;  Location: [facility];  Service: Urology;;  . CYSTOSCOPY/RETROGRADE/URETEROSCOPY/STONE EXTRACTION WITH BASKET Left 08/07/2019   Procedure: CYSTOSCOPY/RETROGRADE/URETEROSCOPY/STONE EXTRACTION WITH BASKET;  Surgeon: Festus Aloe, MD;  Location: [facility];  Service: Urology;   Laterality: Left;  . ESOPHAGOGASTRODUODENOSCOPY (EGD) WITH PROPOFOL N/A 08/29/2015   Dr. Oneida Alar: possible proximal esophageal web s/p dilation, moderate gastritis, negative eosinophilic esophagitis   . ESOPHAGOGASTRODUODENOSCOPY (EGD) WITH PROPOFOL  06/01/2019   Procedure: ESOPHAGOGASTRODUODENOSCOPY (EGD) WITH PROPOFOL;  Surgeon: Danie Binder, MD;  [facility];  Service: Endoscopy;;  . PARTIAL COLECTOMY N/A 03/02/2018   Procedure: PARTIAL COLECTOMY;  Surgeon: Aviva Signs, MD;  Location: [facility];  Service: General;  Laterality: N/A;  . PORTACATH PLACEMENT Left 04/13/2018   Procedure: INSERTION PORT-A-CATH;  Surgeon: Aviva Signs, MD;  Location: [facility];  Service: General;  Laterality: Left;  . SAVORY DILATION N/A 08/29/2015   Procedure: SAVORY DILATION;  Surgeon: Danie Binder, MD;  [facility];  Service: Endoscopy;  Laterality: N/A;  . URETHRAL DILATION    . WISDOM TOOTH EXTRACTION       SOCIAL HISTORY:  Social History   Socioeconomic History  . Marital status: Divorced    Spouse name: Not on file  . Number of children: Not on file  . Years of education: Not on file  . Highest education level: Not on file  Occupational History  . Not on file  Tobacco Use  . Smoking status: Former Smoker    Packs/day: 0.25    Years: 9.00    Pack years: 2.25    Types: Cigarettes    Quit date: 07/27/2011    Years since quitting: 8.2  . Smokeless tobacco: Never Used  Vaping Use  . Vaping Use: Never used  Substance and Sexual Activity  . Alcohol use: Yes    Alcohol/week: 6.0 standard drinks    Types: 6 Cans of beer per week  . Drug use: No  . Sexual activity: Yes    Birth control/protection: None  Other Topics Concern  . Not on file  Social History Narrative  . Not on file   Social Determinants of Health   Financial Resource Strain:   . Difficulty of Paying Living Expenses:   Food Insecurity:   . Worried About Charity fundraiser in the Last Year:   . Youth worker in the Last Year:   Transportation Needs:   . Film/video editor (Medical):   Marland Kitchen Lack of Transportation (Non-Medical):   Physical Activity:   . Days of Exercise per Week:   . Minutes of Exercise per Session:   Stress:   . Feeling of Stress :   Social Connections:   . Frequency of Communication with Friends and Family:   . Frequency of Social Gatherings with Friends and Family:   . Attends Religious Services:   . Active Member of Clubs or Organizations:   . Attends Archivist Meetings:   Marland Kitchen Marital Status:   Intimate Partner Violence:   . Fear of Current or Ex-Partner:   . Emotionally Abused:   Marland Kitchen Physically Abused:   . Sexually Abused:     FAMILY HISTORY:  Family History  Problem Relation Age of Onset  . Dementia Mother   . Dementia Maternal Aunt   . Dementia Maternal Grandmother   . COPD Maternal Grandfather   .  Breast cancer Maternal Aunt 42  . Breast cancer Maternal Aunt        dx in her 21s  . Lung cancer Maternal Aunt   . Healthy Son   . Colon cancer Neg Hx     CURRENT MEDICATIONS:  Outpatient Encounter Medications as of 10/15/2019  Medication Sig Note  . capecitabine (XELODA) 500 MG tablet Take 4 tablets (2,000 mg total) by mouth 2 (two) times daily after a meal. Take for 14 days, then hold for 7 days. Repeat every 21 days. (Patient taking differently: Take 850 mg/m2 by mouth 2 (two) times daily after a meal. Take for 14 days, then hold for 7 days. Repeat every 21 days.  Take 2 tabs in the morning and 3 tabs at night)   . albuterol (VENTOLIN HFA) 108 (90 Base) MCG/ACT inhaler Inhale 2 puffs into the lungs every 6 (six) hours as needed for wheezing or shortness of breath.  (Patient not taking: Reported on 09/30/2019)   . CONTOUR NEXT TEST test strip USE TO CHECK BLOOD SUGAR BID   . glipiZIDE (GLUCOTROL XL) 5 MG 24 hr tablet Take two tablets daily until you see Dr. Hilma Favors (Patient taking differently: 5 mg in the morning and at bedtime. )   .  lidocaine-prilocaine (EMLA) cream Apply small amount to port site one hour prior to appointment. Cover with plastic wrap (Patient not taking: Reported on 09/30/2019)   . LORazepam (ATIVAN) 0.5 MG tablet Take 1 tablet (0.5 mg total) by mouth every 8 (eight) hours.   Marland Kitchen losartan (COZAAR) 100 MG tablet Take 100 mg by mouth daily.   . magnesium oxide (MAG-OX) 400 MG tablet Take 400 mg by mouth daily.   . metFORMIN (GLUCOPHAGE) 500 MG tablet Take 500 mg by mouth 2 (two) times daily with a meal.   . Multiple Vitamin (MULTIVITAMIN) tablet Take 1 tablet by mouth daily.    . ondansetron (ZOFRAN) 8 MG tablet Take 1 tablet (8 mg total) by mouth every 8 (eight) hours as needed for nausea or vomiting. (Patient not taking: Reported on 09/30/2019)   . OXALIPLATIN IV Inject into the vein. 09/24/2019: IV infusion chemotherapy   . pantoprazole (PROTONIX) 40 MG tablet Take 40 mg by mouth daily. (Patient not taking: Reported on 09/30/2019)   . Potassium 99 MG TABS Take 99 mg by mouth daily.   . pravastatin (PRAVACHOL) 20 MG tablet Take 20 mg by mouth daily.    Marland Kitchen topiramate (TOPAMAX) 100 MG tablet Take 100 mg by mouth daily.    . [DISCONTINUED] glipiZIDE (GLUCOTROL XL) 5 MG 24 hr tablet TAKE 1 TABLET(5 MG) BY MOUTH DAILY WITH BREAKFAST    Facility-Administered Encounter Medications as of 10/15/2019  Medication  . sodium chloride flush (NS) 0.9 % injection 10 mL    ALLERGIES:  No Known Allergies  Vital signs: BP 129/92 Pulse 95 Respirations 18 Temperature 98 O2 98% on room air   PHYSICAL EXAM:  ECOG Performance status: 1  Physical Exam Constitutional:      Appearance: Normal appearance. He is normal weight.  Cardiovascular:     Rate and Rhythm: Normal rate and regular rhythm.     Heart sounds: Normal heart sounds.  Pulmonary:     Effort: Pulmonary effort is normal.     Breath sounds: Normal breath sounds.  Abdominal:     General: Bowel sounds are normal.     Palpations: Abdomen is soft.   Musculoskeletal:        General: Normal range  of motion.  Skin:    General: Skin is warm.  Neurological:     Mental Status: He is alert and oriented to person, place, and time. Mental status is at baseline.  Psychiatric:        Mood and Affect: Mood normal.        Behavior: Behavior normal.        Thought Content: Thought content normal.        Judgment: Judgment normal.      LABORATORY DATA:  I have reviewed the labs as listed.  CBC    Component Value Date/Time   WBC 6.4 10/15/2019 0845   RBC 4.92 10/15/2019 0845   HGB 13.7 10/15/2019 0845   HCT 41.5 10/15/2019 0845   PLT 191 10/15/2019 0845   MCV 84.3 10/15/2019 0845   MCH 27.8 10/15/2019 0845   MCHC 33.0 10/15/2019 0845   RDW 14.6 10/15/2019 0845   LYMPHSABS 1.9 10/15/2019 0845   MONOABS 0.3 10/15/2019 0845   EOSABS 0.3 10/15/2019 0845   BASOSABS 0.1 10/15/2019 0845   CMP Latest Ref Rng & Units 10/15/2019 10/08/2019 09/30/2019  Glucose 70 - 99 mg/dL 222(H) 123(H) 184(H)  BUN 6 - 20 mg/dL 10 5(L) 14  Creatinine 0.61 - 1.24 mg/dL 0.87 0.83 0.95  Sodium 135 - 145 mmol/L 138 144 136  Potassium 3.5 - 5.1 mmol/L 3.9 3.9 3.7  Chloride 98 - 111 mmol/L 107 111 106  CO2 22 - 32 mmol/L 22 23 21(L)  Calcium 8.9 - 10.3 mg/dL 8.6(L) 8.5(L) 9.0  Total Protein 6.5 - 8.1 g/dL 6.6 6.4(L) 6.9  Total Bilirubin 0.3 - 1.2 mg/dL 0.4 0.3 0.7  Alkaline Phos 38 - 126 U/L 50 48 43  AST 15 - 41 U/L 34 26 43(H)  ALT 0 - 44 U/L 60(H) 45(H) 98(H)    All questions were answered to patient's stated satisfaction. Encouraged patient to call with any new concerns or questions before his next visit to the cancer center and we can certain see him sooner, if needed.     ASSESSMENT & PLAN:  Malignant neoplasm of sigmoid colon ([place]) 1.  Stage III sigmoid colon adenocarcinoma: -MSI stable, status post sigmoid colon resection on 03/02/2018, 14/35 lymph nodes positive. -12 cycles of FOLFOX from 04/21/2018 through 10/06/2018. -Repeat colonoscopy on  06/01/2019 showed recurrence at the anastomotic area, biopsy consistent with adenocarcinoma. -PET scan on 07/05/2019 shows increased uptake in the anastomotic area.  There are 2 small 5 mm nodules in the right upper lobe and left upper lobe which are new.  These are not hypermetabolic on PET scan given the small size.  Patient also had COVID-19 infection earlier this year. -We reviewed the pathology from 07/28/2018 which showed recurrent moderately differentiated adenocarcinoma, metastatic carcinoma in 3/6 lymph nodes.  Margins are negative.  Grade 2, RPT 2, RpN1B. -MMR is preserved. -Started Xeloda therapy on 618 2021 x 3 months based upon idea collaboration study results. -Xeloda dose is 1500 mg twice daily 2 weeks on and 1 week off (full dose is 2000 mg twice daily 2 weeks on and 1 week off-can consider dose titration based upon tolerability of the cycle 1 and 2) -Xeloda dose placed on hold beginning 09/30/2019 due to nausea/diarrhea/liver enzyme elevation only after being on medication for 1 week. -Negative DPD testing on 09/30/2019. -Xeloda dose reduced to 1500 mg in a.m. and 1000 mg in the p.m. 14 days on and 7 days off beginning on 10/15/2019 (cycle 2). -He starts his 102rd  cycle of Xeloda today 10/15/2019 and will continue it for the next 14 days at the reduced dose. -Labs done on 10/15/2019 showed AST 34, ALT 60, WBC 6.4, hemoglobin 13.7, platelets 191 -He will follow-up next week with labs.  2.  Peripheral neuropathy: -She had some numbness in the fingertips and toes which is completely resolved at this time  3.  Elevated LFTs: -She has mildly elevated ALT which is likely from fatty liver noted on previous scans. -Labs done on 10/15/2019 showed AST 34, ALT 60     Orders placed this encounter:  Orders Placed This Encounter  Procedures  . Lactate dehydrogenase  . CBC with Differential/Platelet  . Comprehensive metabolic panel      Francene Finders, FNP-C [place] [phone]

## 2019-10-21 ENCOUNTER — Other Ambulatory Visit: Payer: Self-pay

## 2019-10-21 ENCOUNTER — Inpatient Hospital Stay (HOSPITAL_COMMUNITY): Payer: No Typology Code available for payment source

## 2019-10-21 ENCOUNTER — Inpatient Hospital Stay (HOSPITAL_BASED_OUTPATIENT_CLINIC_OR_DEPARTMENT_OTHER): Payer: No Typology Code available for payment source | Admitting: Hematology

## 2019-10-21 ENCOUNTER — Encounter (HOSPITAL_COMMUNITY): Payer: Self-pay | Admitting: Hematology

## 2019-10-21 VITALS — BP 129/82 | HR 85 | Temp 97.5°F | Resp 18 | Wt 227.7 lb

## 2019-10-21 DIAGNOSIS — Z5111 Encounter for antineoplastic chemotherapy: Secondary | ICD-10-CM | POA: Diagnosis not present

## 2019-10-21 DIAGNOSIS — C187 Malignant neoplasm of sigmoid colon: Secondary | ICD-10-CM

## 2019-10-21 LAB — COMPREHENSIVE METABOLIC PANEL
ALT: 92 U/L — ABNORMAL HIGH (ref 0–44)
AST: 36 U/L (ref 15–41)
Albumin: 3.9 g/dL (ref 3.5–5.0)
Alkaline Phosphatase: 43 U/L (ref 38–126)
Anion gap: 7 (ref 5–15)
BUN: 12 mg/dL (ref 6–20)
CO2: 24 mmol/L (ref 22–32)
Calcium: 8.6 mg/dL — ABNORMAL LOW (ref 8.9–10.3)
Chloride: 107 mmol/L (ref 98–111)
Creatinine, Ser: 0.85 mg/dL (ref 0.61–1.24)
GFR calc Af Amer: >60 mL/min (ref >60)
GFR calc non Af Amer: >60 mL/min (ref >60)
Glucose, Bld: 264 mg/dL — ABNORMAL HIGH (ref 70–99)
Potassium: 3.9 mmol/L (ref 3.5–5.1)
Sodium: 138 mmol/L (ref 135–145)
Total Bilirubin: 0.5 mg/dL (ref 0.3–1.2)
Total Protein: 6.3 g/dL — ABNORMAL LOW (ref 6.5–8.1)

## 2019-10-21 LAB — CBC WITH DIFFERENTIAL/PLATELET
Abs Immature Granulocytes: 0.02 10*3/uL (ref 0.00–0.07)
Basophils Absolute: 0 10*3/uL (ref 0.0–0.1)
Basophils Relative: 1 %
Eosinophils Absolute: 0.2 10*3/uL (ref 0.0–0.5)
Eosinophils Relative: 3 %
HCT: 38.2 % — ABNORMAL LOW (ref 39.0–52.0)
Hemoglobin: 12.8 g/dL — ABNORMAL LOW (ref 13.0–17.0)
Immature Granulocytes: 0 %
Lymphocytes Relative: 30 %
Lymphs Abs: 1.9 10*3/uL (ref 0.7–4.0)
MCH: 27.9 pg (ref 26.0–34.0)
MCHC: 33.5 g/dL (ref 30.0–36.0)
MCV: 83.4 fL (ref 80.0–100.0)
Monocytes Absolute: 0.3 10*3/uL (ref 0.1–1.0)
Monocytes Relative: 5 %
Neutro Abs: 3.9 10*3/uL (ref 1.7–7.7)
Neutrophils Relative %: 61 %
Platelets: 112 10*3/uL — ABNORMAL LOW (ref 150–400)
RBC: 4.58 MIL/uL (ref 4.22–5.81)
RDW: 13.7 % (ref 11.5–15.5)
WBC: 6.4 10*3/uL (ref 4.0–10.5)
nRBC: 0 % (ref 0.0–0.2)

## 2019-10-21 LAB — LACTATE DEHYDROGENASE: LDH: 144 U/L (ref 98–192)

## 2019-10-21 MED ORDER — HEPARIN SOD (PORK) LOCK FLUSH 100 UNIT/ML IV SOLN
500.0000 [IU] | Freq: Once | INTRAVENOUS | Status: AC
  Administered 2019-10-21: 500 [IU] via INTRAVENOUS

## 2019-10-21 MED ORDER — SCOPOLAMINE 1 MG/3DAYS TD PT72
MEDICATED_PATCH | TRANSDERMAL | Status: AC
  Filled 2019-10-21: qty 1

## 2019-10-21 MED ORDER — SCOPOLAMINE 1 MG/3DAYS TD PT72
1.0000 | MEDICATED_PATCH | Freq: Once | TRANSDERMAL | Status: AC
  Administered 2019-10-21: 1.5 mg via TRANSDERMAL

## 2019-10-21 MED ORDER — SODIUM CHLORIDE 0.9% FLUSH
10.0000 mL | Freq: Once | INTRAVENOUS | Status: AC
  Administered 2019-10-21: 10 mL via INTRAVENOUS

## 2019-10-21 MED ORDER — OLANZAPINE 10 MG PO TABS
10.0000 mg | ORAL_TABLET | Freq: Every day | ORAL | 0 refills | Status: DC
Start: 2019-10-21 — End: 2019-11-17

## 2019-10-21 NOTE — Addendum Note (Signed)
Addended by: Charlyne Petrin B on: 10/21/2019 08:55 AM   Modules accepted: Orders

## 2019-10-21 NOTE — Progress Notes (Signed)
[place], [place] [address]   CLINIC:  Medical Oncology/Hematology  PCP:  Sharilyn Sites, [place] / [place] [address] [phone]   REASON FOR VISIT:  Follow-up for recurrent colon cancer  PRIOR THERAPY: FOLFOX x 12 cycles from 04/21/2018 to 10/06/2018  CURRENT THERAPY: XELOX  BRIEF ONCOLOGIC HISTORY:  Oncology History  Malignant neoplasm of sigmoid colon [facility])   Initial Diagnosis   Cancer of sigmoid colon ([place])   03/27/2018 Genetic Testing   ALK c.350C>G VUS identified on the multicancer panel.  The Multi-Gene Panel offered by [facility] includes sequencing and/or deletion duplication testing of the following 85 genes: AIP, ALK, APC, ATM, AXIN2,BAP1,  BARD1, BLM, BMPR1A, BRCA1, BRCA2, BRIP1, CASR, CDC73, CDH1, CDK4, CDKN1B, CDKN1C, CDKN2A (p14ARF), CDKN2A (p16INK4a), CEBPA, CHEK2, CTNNA1, DICER1, DIS3L2, EGFR (c.2369C>T, p.Thr790Met variant only), EPCAM (Deletion/duplication testing only), FH, FLCN, GATA2, GPC3, GREM1 (Promoter region deletion/duplication testing only), HOXB13 (c.251G>A, p.Gly84Glu), HRAS, KIT, MAX, MEN1, MET, MITF (c.952G>A, p.Glu318Lys variant only), MLH1, MSH2, MSH3, MSH6, MUTYH, NBN, NF1, NF2, NTHL1, PALB2, PDGFRA, PHOX2B, PMS2, POLD1, POLE, POT1, PRKAR1A, PTCH1, PTEN, RAD50, RAD51C, RAD51D, RB1, RECQL4, RET, RNF43, RUNX1, SDHAF2, SDHA (sequence changes only), SDHB, SDHC, SDHD, SMAD4, SMARCA4, SMARCB1, SMARCE1, STK11, SUFU, TERC, TERT, TMEM127, TP53, TSC1, TSC2, VHL, WRN and WT1.   The report date is 03/27/2018.   04/21/2018 - 10/08/2018 Chemotherapy   The patient had palonosetron (ALOXI) injection 0.25 mg, 0.25 mg, Intravenous,  Once, 13 of 13 cycles Administration: 0.25 mg (04/21/2018), 0.25 mg (05/05/2018), 0.25 mg (05/19/2018), 0.25 mg (06/02/2018), 0.25 mg (06/17/2018), 0.25 mg (06/30/2018), 0.25 mg (07/13/2018), 0.25 mg (08/11/2018), 0.25 mg (08/25/2018), 0.25 mg (09/08/2018), 0.25 mg (09/22/2018), 0.25 mg  (10/06/2018) leucovorin 800 mg in dextrose 5 % 250 mL infusion, 876 mg, Intravenous,  Once, 13 of 13 cycles Administration: 800 mg (04/21/2018), 900 mg (05/05/2018), 800 mg (05/19/2018), 800 mg (06/02/2018), 900 mg (06/17/2018), 900 mg (06/30/2018), 900 mg (07/13/2018), 900 mg (08/11/2018), 900 mg (08/25/2018), 876 mg (09/22/2018), 900 mg (09/08/2018), 876 mg (10/06/2018) oxaliplatin (ELOXATIN) 185 mg in dextrose 5 % 500 mL chemo infusion, 85 mg/m2 = 185 mg, Intravenous,  Once, 13 of 13 cycles Dose modification: 68 mg/m2 (80 % of original dose 85 mg/m2, Cycle 12, Reason: Other (see comments), Comment: neuropathy), 42.5 mg/m2 (50 % of original dose 85 mg/m2, Cycle 13, Reason: Provider Judgment) Administration: 185 mg (04/21/2018), 185 mg (05/05/2018), 185 mg (05/19/2018), 185 mg (06/02/2018), 185 mg (06/17/2018), 185 mg (06/30/2018), 185 mg (07/13/2018), 185 mg (08/11/2018), 185 mg (08/25/2018), 185 mg (09/08/2018), 150 mg (09/22/2018), 95 mg (10/06/2018) fluorouracil (ADRUCIL) chemo injection 900 mg, 400 mg/m2 = 900 mg, Intravenous,  Once, 13 of 13 cycles Administration: 900 mg (04/21/2018), 900 mg (05/05/2018), 900 mg (05/19/2018), 900 mg (06/02/2018), 900 mg (06/17/2018), 900 mg (06/30/2018), 900 mg (07/13/2018), 900 mg (08/11/2018), 900 mg (08/25/2018), 900 mg (09/08/2018), 900 mg (09/22/2018), 900 mg (10/06/2018) fosaprepitant (EMEND) 150 mg, dexamethasone (DECADRON) 12 mg in sodium chloride 0.9 % 145 mL IVPB, , Intravenous,  Once, 10 of 10 cycles Administration:  (06/02/2018),  (06/17/2018),  (06/30/2018),  (07/13/2018),  (08/11/2018),  (08/25/2018),  (09/08/2018),  (09/22/2018),  (10/06/2018) fluorouracil (ADRUCIL) 5,250 mg in sodium chloride 0.9 % 145 mL chemo infusion, 2,400 mg/m2 = 5,250 mg, Intravenous, 1 Day/Dose, 13 of 13 cycles Administration: 5,250 mg (04/21/2018), 5,250 mg (05/05/2018), 5,250 mg (05/19/2018), 5,250 mg (06/02/2018), 5,250 mg (06/17/2018), 5,250 mg (06/30/2018), 5,250 mg (07/13/2018), 5,250 mg (08/11/2018), 5,250 mg (08/25/2018), 5,250 mg  (  09/08/2018), 5,250 mg (09/22/2018), 5,250 mg (10/06/2018)  for chemotherapy treatment.    09/24/2019 -  Chemotherapy   The patient had capecitabine (XELODA) 500 MG tablet, 850 mg/m2 = 2,000 mg, Oral, 2 times daily after meals, 1 of 1 cycle, Start date: 09/17/2019, End date: -- palonosetron (ALOXI) injection 0.25 mg, 0.25 mg, Intravenous,  Once, 2 of 4 cycles Administration: 0.25 mg (09/24/2019), 0.25 mg (10/15/2019) oxaliplatin (ELOXATIN) 300 mg in dextrose 5 % 500 mL chemo infusion, 130 mg/m2 = 300 mg, Intravenous,  Once, 2 of 4 cycles Administration: 300 mg (09/24/2019), 300 mg (10/15/2019)  for chemotherapy treatment.    Malignant neoplasm of descending colon ([place])  07/28/2019 Initial Diagnosis   Malignant neoplasm of descending colon ([place])   09/24/2019 -  Chemotherapy   The patient had capecitabine (XELODA) 500 MG tablet, 850 mg/m2 = 2,000 mg, Oral, 2 times daily after meals, 1 of 1 cycle, Start date: 09/17/2019, End date: -- palonosetron (ALOXI) injection 0.25 mg, 0.25 mg, Intravenous,  Once, 2 of 4 cycles Administration: 0.25 mg (09/24/2019), 0.25 mg (10/15/2019) oxaliplatin (ELOXATIN) 300 mg in dextrose 5 % 500 mL chemo infusion, 130 mg/m2 = 300 mg, Intravenous,  Once, 2 of 4 cycles Administration: 300 mg (09/24/2019), 300 mg (10/15/2019)  for chemotherapy treatment.      CANCER STAGING: Cancer Staging No matching staging information was found for the patient.  INTERVAL HISTORY:  [name], a 44 y.o. male, returns for routine follow-up of his recurrent colon cancer. Joel Jefferson was last seen on 09/14/2019.  Today he reports that since starting the Xeloda 2 tablets in the AM and 2 tablets in the PM last week, he has been feeling nauseous and vomited and had diarrhea several times last week, despite taking Zofran and Ativan; his last vomit was on Tuesday, 10/19/2019. He says the Compazine did not help at all with the nausea. He is currently mildly nauseous.The diarrhea occurred yesterday,  but none today since taking the Imodium. He reports having cold sensitivity and intermittent numbness in his feet after the oxaliplatin infusion.   REVIEW OF SYSTEMS:  Review of Systems  Constitutional: Positive for appetite change (depleted appetite) and fatigue (moderate).  Gastrointestinal: Positive for diarrhea, nausea and vomiting.  All other systems reviewed and are negative.   PAST MEDICAL/SURGICAL HISTORY:  Past Medical History:  Diagnosis Date  . Diabetes mellitus without complication ([place])   . Family history of breast cancer   . GERD (gastroesophageal reflux disease)   . History of kidney stones   . Hypercholesterolemia   . Hypertension   . Urethral stricture    Past Surgical History:  Procedure Laterality Date  . BALLOON DILATION  04/06/2012   Procedure: BALLOON DILATION;  Surgeon: Reece Packer, MD;  Location[facility];  Service: Urology;  Laterality: N/A;  . BIOPSY  02/24/2018   Procedure: BIOPSY;  Surgeon: Danie Binder, MD;  [facility];  Service: Endoscopy;;  colon  . BIOPSY  06/01/2019   Procedure: BIOPSY;  Surgeon: Danie Binder, MD;  [facility];  Service: Endoscopy;;  . BOWEL RESECTION N/A 07/28/2019   Procedure: LOW ANTERIOR BOWEL RESECTION;  Surgeon: Aviva Signs, MD;  Location: [facility];  Service: General;  Laterality: N/A;  . COLONOSCOPY WITH PROPOFOL N/A 02/24/2018   Dr. Oneida Alar: fungating, infiltrative partially obstructing mass in sigmoid colon, area tattooed. ext/int hemorrhoids. Bx: adenocarcinoma  . COLONOSCOPY WITH PROPOFOL N/A 06/01/2019   Procedure: COLONOSCOPY WITH PROPOFOL;  Surgeon: Danie Binder, MD;  [facility];  Service: Endoscopy;  Laterality: N/A;  9:30am  . CYSTOSCOPY WITH HOLMIUM LASER LITHOTRIPSY  08/07/2019   Procedure: CYSTOSCOPY WITH HOLMIUM LASER LITHOTRIPSY;  Surgeon: Festus Aloe, MD;  Location: [facility];  Service: Urology;;  . CYSTOSCOPY/RETROGRADE/URETEROSCOPY/STONE  EXTRACTION WITH BASKET Left 08/07/2019   Procedure: CYSTOSCOPY/RETROGRADE/URETEROSCOPY/STONE EXTRACTION WITH BASKET;  Surgeon: Festus Aloe, MD;  Location: [facility];  Service: Urology;  Laterality: Left;  . ESOPHAGOGASTRODUODENOSCOPY (EGD) WITH PROPOFOL N/A 08/29/2015   Dr. Oneida Alar: possible proximal esophageal web s/p dilation, moderate gastritis, negative eosinophilic esophagitis   . ESOPHAGOGASTRODUODENOSCOPY (EGD) WITH PROPOFOL  06/01/2019   Procedure: ESOPHAGOGASTRODUODENOSCOPY (EGD) WITH PROPOFOL;  Surgeon: Danie Binder, MD;  [facility];  Service: Endoscopy;;  . PARTIAL COLECTOMY N/A 03/02/2018   Procedure: PARTIAL COLECTOMY;  Surgeon: Aviva Signs, MD;  Location[facility];  Service: General;  Laterality: N/A;  . PORTACATH PLACEMENT Left 04/13/2018   Procedure: INSERTION PORT-A-CATH;  Surgeon: Aviva Signs, MD;  Location: [facility];  Service: General;  Laterality: Left;  . SAVORY DILATION N/A 08/29/2015   Procedure: SAVORY DILATION;  Surgeon: Danie Binder, MD;  [facility];  Service: Endoscopy;  Laterality: N/A;  . URETHRAL DILATION    . WISDOM TOOTH EXTRACTION      SOCIAL HISTORY:  Social History   Socioeconomic History  . Marital status: Divorced    Spouse name: Not on file  . Number of children: Not on file  . Years of education: Not on file  . Highest education level: Not on file  Occupational History  . Not on file  Tobacco Use  . Smoking status: Former Smoker    Packs/day: 0.25    Years: 9.00    Pack years: 2.25    Types: Cigarettes    Quit date: 07/27/2011    Years since quitting: 8.2  . Smokeless tobacco: Never Used  Vaping Use  . Vaping Use: Never used  Substance and Sexual Activity  . Alcohol use: Yes    Alcohol/week: 6.0 standard drinks    Types: 6 Cans of beer per week  . Drug use: No  . Sexual activity: Yes    Birth control/protection: None  Other Topics Concern  . Not on file  Social History Narrative  . Not on file   Social  Determinants of Health   Financial Resource Strain:   . Difficulty of Paying Living Expenses:   Food Insecurity:   . Worried About Charity fundraiser in the Last Year:   . Arboriculturist in the Last Year:   Transportation Needs:   . Film/video editor (Medical):   Marland Kitchen Lack of Transportation (Non-Medical):   Physical Activity:   . Days of Exercise per Week:   . Minutes of Exercise per Session:   Stress:   . Feeling of Stress :   Social Connections:   . Frequency of Communication with Friends and Family:   . Frequency of Social Gatherings with Friends and Family:   . Attends Religious Services:   . Active Member of Clubs or Organizations:   . Attends Archivist Meetings:   Marland Kitchen Marital Status:   Intimate Partner Violence:   . Fear of Current or Ex-Partner:   . Emotionally Abused:   Marland Kitchen Physically Abused:   . Sexually Abused:     FAMILY HISTORY:  Family History  Problem Relation Age of Onset  . Dementia Mother   . Dementia  Maternal Aunt   . Dementia Maternal Grandmother   . COPD Maternal Grandfather   . Breast cancer Maternal Aunt 42  . Breast cancer Maternal Aunt        dx in her 80s  . Lung cancer Maternal Aunt   . Healthy Son   . Colon cancer Neg Hx     CURRENT MEDICATIONS:  Current Outpatient Medications  Medication Sig Dispense Refill  . bismuth subsalicylate (PEPTO BISMOL) 262 MG/15ML suspension Take 30 mLs by mouth every 6 (six) hours as needed.    . capecitabine (XELODA) 500 MG tablet Take 4 tablets (2,000 mg total) by mouth 2 (two) times daily after a meal. Take for 14 days, then hold for 7 days. Repeat every 21 days. (Patient taking differently: Take 850 mg/m2 by mouth 2 (two) times daily after a meal. Take for 14 days, then hold for 7 days. Repeat every 21 days.  Take 2 tabs in the morning and 3 tabs at night) 112 tablet 3  . CONTOUR NEXT TEST test strip USE TO CHECK BLOOD SUGAR BID    . glipiZIDE (GLUCOTROL XL) 5 MG 24 hr tablet Take two tablets  daily until you see Dr. Hilma Favors (Patient taking differently: 5 mg in the morning and at bedtime. ) 30 tablet 3  . lidocaine-prilocaine (EMLA) cream Apply small amount to port site one hour prior to appointment. Cover with plastic wrap 30 g 3  . LORazepam (ATIVAN) 0.5 MG tablet Take 1 tablet (0.5 mg total) by mouth every 8 (eight) hours. 30 tablet 2  . losartan (COZAAR) 100 MG tablet Take 100 mg by mouth daily.    . magnesium oxide (MAG-OX) 400 MG tablet Take 400 mg by mouth daily.    . metFORMIN (GLUCOPHAGE) 500 MG tablet Take 500 mg by mouth 2 (two) times daily with a meal.    . Multiple Vitamin (MULTIVITAMIN) tablet Take 1 tablet by mouth daily.     . ondansetron (ZOFRAN) 8 MG tablet Take 1 tablet (8 mg total) by mouth every 8 (eight) hours as needed for nausea or vomiting. 45 tablet 2  . OXALIPLATIN IV Inject into the vein.    Marland Kitchen Potassium 99 MG TABS Take 99 mg by mouth daily.    . pravastatin (PRAVACHOL) 20 MG tablet Take 20 mg by mouth daily.   1  . topiramate (TOPAMAX) 100 MG tablet Take 100 mg by mouth daily.     Marland Kitchen omeprazole (PRILOSEC) 40 MG capsule Take 40 mg by mouth daily.    . pantoprazole (PROTONIX) 40 MG tablet Take 40 mg by mouth daily. (Patient not taking: Reported on 09/30/2019)     No current facility-administered medications for this visit.    ALLERGIES:  No Known Allergies  PHYSICAL EXAM:  Performance status (ECOG): 1 - Symptomatic but completely ambulatory  Vitals:   10/21/19 0751  BP: 129/82  Pulse: 85  Resp: 18  Temp: (!) 97.5 F (36.4 C)  SpO2: 99%   Wt Readings from Last 3 Encounters:  10/21/19 227 lb 11.8 oz (103.3 kg)  10/15/19 233 lb (105.7 kg)  10/08/19 231 lb 8 oz (105 kg)   Physical Exam Vitals reviewed.  Constitutional:      Appearance: Normal appearance. He is obese.  Cardiovascular:     Rate and Rhythm: Normal rate and regular rhythm.     Pulses: Normal pulses.     Heart sounds: Normal heart sounds.  Pulmonary:     Effort: Pulmonary  effort is normal.  Breath sounds: Normal breath sounds.  Chest:     Comments: Port on left chest Neurological:     General: No focal deficit present.     Mental Status: He is alert and oriented to person, place, and time.  Psychiatric:        Mood and Affect: Mood normal.        Behavior: Behavior normal.      LABORATORY DATA:  I have reviewed the labs as listed.  CBC Latest Ref Rng & Units 10/21/2019 10/15/2019 10/08/2019  WBC 4.0 - 10.5 K/uL 6.4 6.4 7.0  Hemoglobin 13.0 - 17.0 g/dL 12.8(L) 13.7 12.7(L)  Hematocrit 39 - 52 % 38.2(L) 41.5 38.6(L)  Platelets 150 - 400 K/uL 112(L) 191 217   CMP Latest Ref Rng & Units 10/21/2019 10/15/2019 10/08/2019  Glucose 70 - 99 mg/dL 264(H) 222(H) 123(H)  BUN 6 - 20 mg/dL 12 10 5(L)  Creatinine 0.61 - 1.24 mg/dL 0.85 0.87 0.83  Sodium 135 - 145 mmol/L 138 138 144  Potassium 3.5 - 5.1 mmol/L 3.9 3.9 3.9  Chloride 98 - 111 mmol/L 107 107 111  CO2 22 - 32 mmol/L '24 22 23  ' Calcium 8.9 - 10.3 mg/dL 8.6(L) 8.6(L) 8.5(L)  Total Protein 6.5 - 8.1 g/dL 6.3(L) 6.6 6.4(L)  Total Bilirubin 0.3 - 1.2 mg/dL 0.5 0.4 0.3  Alkaline Phos 38 - 126 U/L 43 50 48  AST 15 - 41 U/L 36 34 26  ALT 0 - 44 U/L 92(H) 60(H) 45(H)   Lab Results  Component Value Date   LDH 144 10/21/2019    DIAGNOSTIC IMAGING:  I have independently reviewed the scans and discussed with the patient. No results found.   ASSESSMENT:  1. Stage III (PT3PN2B) sigmoid colon adenocarcinoma, MSI stable, status post sigmoid colon resection on 03/02/2018, 14/35 lymph nodes positive. -12 cycles of FOLFOX from 04/21/2018 through 10/06/2018. -Repeat colonoscopy on 06/01/2019 showed recurrence at the anastomotic area, biopsy consistent with adenocarcinoma. -PET scan on 07/05/2019 shows increased uptake in the anastomotic area. There are 2 small 5 mm nodules in the right upper lobe and left upper lobe which are new. These are not hypermetabolic on PET scan given the small size. Patient also had  COVID-19 infection earlier this year. -LAR on 07/28/2019, pathology showing recurrent moderately differentiated adenocarcinoma, metastatic carcinoma in 3/6 lymph nodes, margins negative.  Grade 2.  RPT2, RPN1B. -MMR is preserved. -3 months of CAPEOX started on 09/24/2019.  Dose of Xeloda reduced to 2 tablets twice daily with cycle 2 on 10/15/2019.   PLAN:  1. Recurrent sigmoid colon adenocarcinoma: -He has received cycle 2 on 10/15/2019. -He is taking Xeloda 2 tablets twice daily.  He had problems with nausea and vomiting. -I reviewed his labs today.  Platelets are low at 112.  White count and hemoglobin are normal.  LFTs show ALT elevation. -He will continue Xeloda 2 tablets twice daily. -He wants to go on vacation and come back for his next treatment on 11/22/2019.  2. Peripheral neuropathy: -He reported tingling and numbness on and off in the feet.  3. Elevated LFTs: -ALT has increased to 92 from 60 on day of cycle 2.  4.  Nausea/vomiting: -He is having continuous nausea.  He vomited a couple of times. -He is taking Zofran 8 mg twice daily and Ativan 0.5 mg twice daily. -I will start him on olanzapine 10 mg to be taken 5 days with each cycle.  He will take it for 5 days starting today  also. -I plan to add him into the next cycle.  5.  Diarrhea: -This is well controlled with Imodium.  Orders placed this encounter:  No orders of the defined types were placed in this encounter.    Derek Jack, MD [place] [phone]   I, Milinda Antis, am acting as a scribe for Dr. Sanda Linger.  I, Derek Jack MD, have reviewed the above documentation for accuracy and completeness, and I agree with the above.

## 2019-10-21 NOTE — Progress Notes (Signed)
Patients port flushed without difficulty.  Good blood return noted with no bruising or swelling noted at site.  Band aid applied.  VSS with discharge and left in satisfactory condition with no s/s of distress noted.   

## 2019-10-21 NOTE — Patient Instructions (Signed)
[place] at [facility] Discharge Instructions  You were seen today by Dr. Delton Coombes. He went over your recent results. You will be prescribed olanzapine for the nausea; start taking it at the beginning of your chemo treatment and take it for 4 days at night at bedtime. Your next treatment will be on August 16th. Dr. Delton Coombes will see you back on August 16 for labs and follow up.   Thank you for choosing [place] at [facility] to provide your oncology and hematology care.  To afford each patient quality time with our provider, please arrive at least 15 minutes before your scheduled appointment time.   If you have a lab appointment with the [facility] please come in thru the Main Entrance and check in at the main information desk  You need to re-schedule your appointment should you arrive 10 or more minutes late.  We strive to give you quality time with our providers, and arriving late affects you and other patients whose appointments are after yours.  Also, if you no show three or more times for appointments you may be dismissed from the clinic at the providers discretion.     Again, thank you for choosing [facility].  Our hope is that these requests will decrease the amount of time that you wait before being seen by our physicians.       _____________________________________________________________  Should you have questions after your visit to [facility], please contact our office at [phone] between the hours of 8:00 a.m. and 4:30 p.m.  Voicemails left after 4:00 p.m. will not be returned until the following business day.  For prescription refill requests, have your pharmacy contact our office and allow 72 hours.    Cancer Center Support Programs:   > Cancer Support Group  2nd Tuesday of the month 1pm-2pm, Journey Room

## 2019-11-17 ENCOUNTER — Other Ambulatory Visit (HOSPITAL_COMMUNITY): Payer: Self-pay | Admitting: Hematology

## 2019-11-19 ENCOUNTER — Other Ambulatory Visit (HOSPITAL_COMMUNITY): Payer: Self-pay

## 2019-11-19 DIAGNOSIS — C187 Malignant neoplasm of sigmoid colon: Secondary | ICD-10-CM

## 2019-11-22 ENCOUNTER — Encounter (HOSPITAL_COMMUNITY): Payer: Self-pay | Admitting: Hematology

## 2019-11-22 ENCOUNTER — Other Ambulatory Visit: Payer: Self-pay

## 2019-11-22 ENCOUNTER — Inpatient Hospital Stay (HOSPITAL_BASED_OUTPATIENT_CLINIC_OR_DEPARTMENT_OTHER): Payer: No Typology Code available for payment source | Admitting: Hematology

## 2019-11-22 ENCOUNTER — Inpatient Hospital Stay (HOSPITAL_COMMUNITY): Payer: No Typology Code available for payment source | Attending: Hematology

## 2019-11-22 ENCOUNTER — Inpatient Hospital Stay (HOSPITAL_COMMUNITY): Payer: No Typology Code available for payment source

## 2019-11-22 VITALS — BP 154/90 | HR 88 | Resp 18

## 2019-11-22 VITALS — BP 138/93 | HR 92 | Temp 97.1°F | Resp 18 | Wt 237.5 lb

## 2019-11-22 DIAGNOSIS — R112 Nausea with vomiting, unspecified: Secondary | ICD-10-CM | POA: Diagnosis not present

## 2019-11-22 DIAGNOSIS — R197 Diarrhea, unspecified: Secondary | ICD-10-CM | POA: Insufficient documentation

## 2019-11-22 DIAGNOSIS — G629 Polyneuropathy, unspecified: Secondary | ICD-10-CM | POA: Insufficient documentation

## 2019-11-22 DIAGNOSIS — R7989 Other specified abnormal findings of blood chemistry: Secondary | ICD-10-CM | POA: Diagnosis not present

## 2019-11-22 DIAGNOSIS — C187 Malignant neoplasm of sigmoid colon: Secondary | ICD-10-CM | POA: Diagnosis not present

## 2019-11-22 DIAGNOSIS — E119 Type 2 diabetes mellitus without complications: Secondary | ICD-10-CM | POA: Diagnosis not present

## 2019-11-22 DIAGNOSIS — Z5111 Encounter for antineoplastic chemotherapy: Secondary | ICD-10-CM | POA: Insufficient documentation

## 2019-11-22 DIAGNOSIS — C186 Malignant neoplasm of descending colon: Secondary | ICD-10-CM

## 2019-11-22 LAB — COMPREHENSIVE METABOLIC PANEL
ALT: 58 U/L — ABNORMAL HIGH (ref 0–44)
AST: 31 U/L (ref 15–41)
Albumin: 3.9 g/dL (ref 3.5–5.0)
Alkaline Phosphatase: 43 U/L (ref 38–126)
Anion gap: 7 (ref 5–15)
BUN: 13 mg/dL (ref 6–20)
CO2: 22 mmol/L (ref 22–32)
Calcium: 8.6 mg/dL — ABNORMAL LOW (ref 8.9–10.3)
Chloride: 108 mmol/L (ref 98–111)
Creatinine, Ser: 0.91 mg/dL (ref 0.61–1.24)
GFR calc Af Amer: >60 mL/min (ref >60)
GFR calc non Af Amer: >60 mL/min (ref >60)
Glucose, Bld: 299 mg/dL — ABNORMAL HIGH (ref 70–99)
Potassium: 4 mmol/L (ref 3.5–5.1)
Sodium: 137 mmol/L (ref 135–145)
Total Bilirubin: 0.6 mg/dL (ref 0.3–1.2)
Total Protein: 6.7 g/dL (ref 6.5–8.1)

## 2019-11-22 LAB — CBC WITH DIFFERENTIAL/PLATELET
Abs Immature Granulocytes: 0.01 10*3/uL (ref 0.00–0.07)
Basophils Absolute: 0 10*3/uL (ref 0.0–0.1)
Basophils Relative: 1 %
Eosinophils Absolute: 0.2 10*3/uL (ref 0.0–0.5)
Eosinophils Relative: 4 %
HCT: 40.8 % (ref 39.0–52.0)
Hemoglobin: 13.8 g/dL (ref 13.0–17.0)
Immature Granulocytes: 0 %
Lymphocytes Relative: 29 %
Lymphs Abs: 1.6 10*3/uL (ref 0.7–4.0)
MCH: 28.9 pg (ref 26.0–34.0)
MCHC: 33.8 g/dL (ref 30.0–36.0)
MCV: 85.4 fL (ref 80.0–100.0)
Monocytes Absolute: 0.3 10*3/uL (ref 0.1–1.0)
Monocytes Relative: 6 %
Neutro Abs: 3.3 10*3/uL (ref 1.7–7.7)
Neutrophils Relative %: 60 %
Platelets: 185 10*3/uL (ref 150–400)
RBC: 4.78 MIL/uL (ref 4.22–5.81)
RDW: 16 % — ABNORMAL HIGH (ref 11.5–15.5)
WBC: 5.4 10*3/uL (ref 4.0–10.5)
nRBC: 0 % (ref 0.0–0.2)

## 2019-11-22 MED ORDER — HEPARIN SOD (PORK) LOCK FLUSH 100 UNIT/ML IV SOLN: 500.0000 [IU] | Freq: Once | INTRAVENOUS | Status: DC | PRN

## 2019-11-22 MED ORDER — SODIUM CHLORIDE 0.9% FLUSH
10.0000 mL | INTRAVENOUS | Status: DC | PRN
  Administered 2019-11-22 (×2): 10 mL

## 2019-11-22 MED ORDER — SODIUM CHLORIDE 0.9 % IV SOLN
10.0000 mg | Freq: Once | INTRAVENOUS | Status: AC
  Administered 2019-11-22: 10 mg via INTRAVENOUS
  Filled 2019-11-22: qty 10

## 2019-11-22 MED ORDER — DEXTROSE 5 % IV SOLN: Freq: Once | INTRAVENOUS | Status: AC

## 2019-11-22 MED ORDER — LORAZEPAM 2 MG/ML IJ SOLN
1.0000 mg | Freq: Once | INTRAMUSCULAR | Status: AC
  Administered 2019-11-22: 1 mg via INTRAVENOUS
  Filled 2019-11-22: qty 1

## 2019-11-22 MED ORDER — PALONOSETRON HCL INJECTION 0.25 MG/5ML
INTRAVENOUS | Status: AC
  Filled 2019-11-22: qty 5

## 2019-11-22 MED ORDER — PALONOSETRON HCL INJECTION 0.25 MG/5ML
0.2500 mg | Freq: Once | INTRAVENOUS | Status: AC
  Administered 2019-11-22: 0.25 mg via INTRAVENOUS

## 2019-11-22 MED ORDER — SODIUM CHLORIDE 0.9 % IV SOLN
150.0000 mg | Freq: Once | INTRAVENOUS | Status: AC
  Administered 2019-11-22: 150 mg via INTRAVENOUS
  Filled 2019-11-22: qty 150

## 2019-11-22 MED ORDER — OXALIPLATIN CHEMO INJECTION 100 MG/20ML
130.0000 mg/m2 | Freq: Once | INTRAVENOUS | Status: AC
  Administered 2019-11-22: 300 mg via INTRAVENOUS
  Filled 2019-11-22: qty 60

## 2019-11-22 NOTE — Progress Notes (Signed)
[place], [place] [address]   CLINIC:  Medical Oncology/Hematology  PCP:  Sharilyn Sites, [place] / [place] [address] [phone]   REASON FOR VISIT:  Follow-up for recurrent colon cancer  PRIOR THERAPY: FOLFOX x 12 cycles from 04/21/2018 to 10/06/2018.  NGS Results: Foundation 1 MS--stable   CURRENT THERAPY: Oxaliplatin every 3 weeks  BRIEF ONCOLOGIC HISTORY:  Oncology History  Malignant neoplasm of sigmoid colon [facility])   Initial Diagnosis   Cancer of sigmoid colon ([place])   03/27/2018 Genetic Testing   ALK c.350C>G VUS identified on the multicancer panel.  The Multi-Gene Panel offered by [facility] includes sequencing and/or deletion duplication testing of the following 85 genes: AIP, ALK, APC, ATM, AXIN2,BAP1,  BARD1, BLM, BMPR1A, BRCA1, BRCA2, BRIP1, CASR, CDC73, CDH1, CDK4, CDKN1B, CDKN1C, CDKN2A (p14ARF), CDKN2A (p16INK4a), CEBPA, CHEK2, CTNNA1, DICER1, DIS3L2, EGFR (c.2369C>T, p.Thr790Met variant only), EPCAM (Deletion/duplication testing only), FH, FLCN, GATA2, GPC3, GREM1 (Promoter region deletion/duplication testing only), HOXB13 (c.251G>A, p.Gly84Glu), HRAS, KIT, MAX, MEN1, MET, MITF (c.952G>A, p.Glu318Lys variant only), MLH1, MSH2, MSH3, MSH6, MUTYH, NBN, NF1, NF2, NTHL1, PALB2, PDGFRA, PHOX2B, PMS2, POLD1, POLE, POT1, PRKAR1A, PTCH1, PTEN, RAD50, RAD51C, RAD51D, RB1, RECQL4, RET, RNF43, RUNX1, SDHAF2, SDHA (sequence changes only), SDHB, SDHC, SDHD, SMAD4, SMARCA4, SMARCB1, SMARCE1, STK11, SUFU, TERC, TERT, TMEM127, TP53, TSC1, TSC2, VHL, WRN and WT1.   The report date is 03/27/2018.   04/21/2018 - 10/08/2018 Chemotherapy   The patient had palonosetron (ALOXI) injection 0.25 mg, 0.25 mg, Intravenous,  Once, 13 of 13 cycles Administration: 0.25 mg (04/21/2018), 0.25 mg (05/05/2018), 0.25 mg (05/19/2018), 0.25 mg (06/02/2018), 0.25 mg (06/17/2018), 0.25 mg (06/30/2018), 0.25 mg (07/13/2018), 0.25 mg (08/11/2018), 0.25 mg (08/25/2018),  0.25 mg (09/08/2018), 0.25 mg (09/22/2018), 0.25 mg (10/06/2018) leucovorin 800 mg in dextrose 5 % 250 mL infusion, 876 mg, Intravenous,  Once, 13 of 13 cycles Administration: 800 mg (04/21/2018), 900 mg (05/05/2018), 800 mg (05/19/2018), 800 mg (06/02/2018), 900 mg (06/17/2018), 900 mg (06/30/2018), 900 mg (07/13/2018), 900 mg (08/11/2018), 900 mg (08/25/2018), 876 mg (09/22/2018), 900 mg (09/08/2018), 876 mg (10/06/2018) oxaliplatin (ELOXATIN) 185 mg in dextrose 5 % 500 mL chemo infusion, 85 mg/m2 = 185 mg, Intravenous,  Once, 13 of 13 cycles Dose modification: 68 mg/m2 (80 % of original dose 85 mg/m2, Cycle 12, Reason: Other (see comments), Comment: neuropathy), 42.5 mg/m2 (50 % of original dose 85 mg/m2, Cycle 13, Reason: Provider Judgment) Administration: 185 mg (04/21/2018), 185 mg (05/05/2018), 185 mg (05/19/2018), 185 mg (06/02/2018), 185 mg (06/17/2018), 185 mg (06/30/2018), 185 mg (07/13/2018), 185 mg (08/11/2018), 185 mg (08/25/2018), 185 mg (09/08/2018), 150 mg (09/22/2018), 95 mg (10/06/2018) fluorouracil (ADRUCIL) chemo injection 900 mg, 400 mg/m2 = 900 mg, Intravenous,  Once, 13 of 13 cycles Administration: 900 mg (04/21/2018), 900 mg (05/05/2018), 900 mg (05/19/2018), 900 mg (06/02/2018), 900 mg (06/17/2018), 900 mg (06/30/2018), 900 mg (07/13/2018), 900 mg (08/11/2018), 900 mg (08/25/2018), 900 mg (09/08/2018), 900 mg (09/22/2018), 900 mg (10/06/2018) fosaprepitant (EMEND) 150 mg, dexamethasone (DECADRON) 12 mg in sodium chloride 0.9 % 145 mL IVPB, , Intravenous,  Once, 10 of 10 cycles Administration:  (06/02/2018),  (06/17/2018),  (06/30/2018),  (07/13/2018),  (08/11/2018),  (08/25/2018),  (09/08/2018),  (09/22/2018),  (10/06/2018) fluorouracil (ADRUCIL) 5,250 mg in sodium chloride 0.9 % 145 mL chemo infusion, 2,400 mg/m2 = 5,250 mg, Intravenous, 1 Day/Dose, 13 of 13 cycles Administration: 5,250 mg (04/21/2018), 5,250 mg (05/05/2018), 5,250 mg (05/19/2018), 5,250 mg (06/02/2018), 5,250 mg (06/17/2018), 5,250 mg (06/30/2018), 5,250 mg (  07/13/2018), 5,250  mg (08/11/2018), 5,250 mg (08/25/2018), 5,250 mg (09/08/2018), 5,250 mg (09/22/2018), 5,250 mg (10/06/2018)  for chemotherapy treatment.    09/24/2019 -  Chemotherapy   The patient had capecitabine (XELODA) 500 MG tablet, 850 mg/m2 = 2,000 mg, Oral, 2 times daily after meals, 1 of 1 cycle, Start date: 09/17/2019, End date: -- palonosetron (ALOXI) injection 0.25 mg, 0.25 mg, Intravenous,  Once, 2 of 4 cycles Administration: 0.25 mg (09/24/2019), 0.25 mg (10/15/2019) oxaliplatin (ELOXATIN) 300 mg in dextrose 5 % 500 mL chemo infusion, 130 mg/m2 = 300 mg, Intravenous,  Once, 2 of 4 cycles Administration: 300 mg (09/24/2019), 300 mg (10/15/2019) fosaprepitant (EMEND) 150 mg in sodium chloride 0.9 % 145 mL IVPB, 150 mg, Intravenous,  Once, 0 of 2 cycles  for chemotherapy treatment.    Malignant neoplasm of descending colon ([place])  07/28/2019 Initial Diagnosis   Malignant neoplasm of descending colon ([place])   09/24/2019 -  Chemotherapy   The patient had capecitabine (XELODA) 500 MG tablet, 850 mg/m2 = 2,000 mg, Oral, 2 times daily after meals, 1 of 1 cycle, Start date: 09/17/2019, End date: -- palonosetron (ALOXI) injection 0.25 mg, 0.25 mg, Intravenous,  Once, 2 of 4 cycles Administration: 0.25 mg (09/24/2019), 0.25 mg (10/15/2019) oxaliplatin (ELOXATIN) 300 mg in dextrose 5 % 500 mL chemo infusion, 130 mg/m2 = 300 mg, Intravenous,  Once, 2 of 4 cycles Administration: 300 mg (09/24/2019), 300 mg (10/15/2019) fosaprepitant (EMEND) 150 mg in sodium chloride 0.9 % 145 mL IVPB, 150 mg, Intravenous,  Once, 0 of 2 cycles  for chemotherapy treatment.      CANCER STAGING: Cancer Staging No matching staging information was found for the patient.  INTERVAL HISTORY:  [name], a 44 y.o. male, returns for routine follow-up and consideration for next cycle of chemotherapy. Joel Jefferson was last seen on 10/21/2019.  Due for cycle #3 of oxaliplatin and Aloxi today.   Overall, he tells me he has been feeling pretty  well. He tolerated the last treatment well and he denies numbness or tingling, but has cold sensitivity lasting for 1 week after treatments. He reports that the Zyprexa helped with the nausea, but felt a weird sensation when he slept. He denies having any mouth sores and his appetite has been good and BM are normal.  Overall, he feels ready for next cycle of chemo today.    REVIEW OF SYSTEMS:  Review of Systems  Constitutional: Positive for fatigue (mild). Negative for appetite change.  HENT:   Negative for mouth sores.   Gastrointestinal: Positive for nausea.  Neurological: Positive for numbness (cold sensitivty for 1 week after Tx).  All other systems reviewed and are negative.   PAST MEDICAL/SURGICAL HISTORY:  Past Medical History:  Diagnosis Date  . Diabetes mellitus without complication ([place])   . Family history of breast cancer   . GERD (gastroesophageal reflux disease)   . History of kidney stones   . Hypercholesterolemia   . Hypertension   . Urethral stricture    Past Surgical History:  Procedure Laterality Date  . BALLOON DILATION  04/06/2012   Procedure: BALLOON DILATION;  Surgeon: Reece Packer, MD;  Location: [facility];  Service: Urology;  Laterality: N/A;  . BIOPSY  02/24/2018   Procedure: BIOPSY;  Surgeon: Danie Binder, MD;  [facility];  Service: Endoscopy;;  colon  . BIOPSY  06/01/2019   Procedure: BIOPSY;  Surgeon: Danie Binder, MD;  [facility];  Service: Endoscopy;;  . BOWEL RESECTION N/A 07/28/2019   Procedure: LOW ANTERIOR BOWEL RESECTION;  Surgeon: Aviva Signs, MD;  Location: [facility];  Service: General;  Laterality: N/A;  . COLONOSCOPY WITH PROPOFOL N/A 02/24/2018   Dr. Oneida Alar: fungating, infiltrative partially obstructing mass in sigmoid colon, area tattooed. ext/int hemorrhoids. Bx: adenocarcinoma  . COLONOSCOPY WITH PROPOFOL N/A 06/01/2019   Procedure: COLONOSCOPY WITH PROPOFOL;  Surgeon: Danie Binder,  MD;  [facility];  Service: Endoscopy;  Laterality: N/A;  9:30am  . CYSTOSCOPY WITH HOLMIUM LASER LITHOTRIPSY  08/07/2019   Procedure: CYSTOSCOPY WITH HOLMIUM LASER LITHOTRIPSY;  Surgeon: Festus Aloe, MD;  Location[facility];  Service: Urology;;  . CYSTOSCOPY/RETROGRADE/URETEROSCOPY/STONE EXTRACTION WITH BASKET Left 08/07/2019   Procedure: CYSTOSCOPY/RETROGRADE/URETEROSCOPY/STONE EXTRACTION WITH BASKET;  Surgeon: Festus Aloe, MD;  Location[facility];  Service: Urology;  Laterality: Left;  . ESOPHAGOGASTRODUODENOSCOPY (EGD) WITH PROPOFOL N/A 08/29/2015   Dr. Oneida Alar: possible proximal esophageal web s/p dilation, moderate gastritis, negative eosinophilic esophagitis   . ESOPHAGOGASTRODUODENOSCOPY (EGD) WITH PROPOFOL  06/01/2019   Procedure: ESOPHAGOGASTRODUODENOSCOPY (EGD) WITH PROPOFOL;  Surgeon: Danie Binder, MD;  [facility];  Service: Endoscopy;;  . PARTIAL COLECTOMY N/A 03/02/2018   Procedure: PARTIAL COLECTOMY;  Surgeon: Aviva Signs, MD;  Location[facility];  Service: General;  Laterality: N/A;  . PORTACATH PLACEMENT Left 04/13/2018   Procedure: INSERTION PORT-A-CATH;  Surgeon: Aviva Signs, MD;  Location: [facility];  Service: General;  Laterality: Left;  . SAVORY DILATION N/A 08/29/2015   Procedure: SAVORY DILATION;  Surgeon: Danie Binder, MD;  [facility];  Service: Endoscopy;  Laterality: N/A;  . URETHRAL DILATION    . WISDOM TOOTH EXTRACTION      SOCIAL HISTORY:  Social History   Socioeconomic History  . Marital status: Divorced    Spouse name: Not on file  . Number of children: Not on file  . Years of education: Not on file  . Highest education level: Not on file  Occupational History  . Not on file  Tobacco Use  . Smoking status: Former Smoker    Packs/day: 0.25    Years: 9.00    Pack years: 2.25    Types: Cigarettes    Quit date: 07/27/2011    Years since quitting: 8.3  . Smokeless tobacco: Never Used  Vaping Use  . Vaping Use:  Never used  Substance and Sexual Activity  . Alcohol use: Yes    Alcohol/week: 6.0 standard drinks    Types: 6 Cans of beer per week  . Drug use: No  . Sexual activity: Yes    Birth control/protection: None  Other Topics Concern  . Not on file  Social History Narrative  . Not on file   Social Determinants of Health   Financial Resource Strain:   . Difficulty of Paying Living Expenses:   Food Insecurity:   . Worried About Charity fundraiser in the Last Year:   . Arboriculturist in the Last Year:   Transportation Needs:   . Film/video editor (Medical):   Marland Kitchen Lack of Transportation (Non-Medical):   Physical Activity:   . Days of Exercise per Week:   . Minutes of Exercise per Session:   Stress:   . Feeling of Stress :   Social Connections:   . Frequency of Communication with Friends and Family:   . Frequency of Social Gatherings with Friends and Family:   . Attends Religious Services:   . Active  Member of Clubs or Organizations:   . Attends Archivist Meetings:   Marland Kitchen Marital Status:   Intimate Partner Violence:   . Fear of Current or Ex-Partner:   . Emotionally Abused:   Marland Kitchen Physically Abused:   . Sexually Abused:     FAMILY HISTORY:  Family History  Problem Relation Age of Onset  . Dementia Mother   . Dementia Maternal Aunt   . Dementia Maternal Grandmother   . COPD Maternal Grandfather   . Breast cancer Maternal Aunt 42  . Breast cancer Maternal Aunt        dx in her 10s  . Lung cancer Maternal Aunt   . Healthy Son   . Colon cancer Neg Hx     CURRENT MEDICATIONS:  Current Outpatient Medications  Medication Sig Dispense Refill  . bismuth subsalicylate (PEPTO BISMOL) 262 MG/15ML suspension Take 30 mLs by mouth every 6 (six) hours as needed.    . capecitabine (XELODA) 500 MG tablet Take 4 tablets (2,000 mg total) by mouth 2 (two) times daily after a meal. Take for 14 days, then hold for 7 days. Repeat every 21 days. (Patient taking differently: Take  850 mg/m2 by mouth 2 (two) times daily after a meal. Take for 14 days, then hold for 7 days. Repeat every 21 days.  Take 2 tabs in the morning and 3 tabs at night) 112 tablet 3  . CONTOUR NEXT TEST test strip USE TO CHECK BLOOD SUGAR BID    . famotidine (PEPCID) 40 MG tablet Take 40 mg by mouth daily.    Marland Kitchen glipiZIDE (GLUCOTROL XL) 5 MG 24 hr tablet Take two tablets daily until you see Dr. Hilma Favors (Patient taking differently: 5 mg in the morning and at bedtime. ) 30 tablet 3  . lidocaine-prilocaine (EMLA) cream Apply small amount to port site one hour prior to appointment. Cover with plastic wrap 30 g 3  . LORazepam (ATIVAN) 0.5 MG tablet Take 1 tablet (0.5 mg total) by mouth every 8 (eight) hours. 30 tablet 2  . losartan (COZAAR) 100 MG tablet Take 100 mg by mouth daily.    . magnesium oxide (MAG-OX) 400 MG tablet Take 400 mg by mouth daily.    . metFORMIN (GLUCOPHAGE) 500 MG tablet Take 500 mg by mouth 2 (two) times daily with a meal.    . OLANZapine (ZYPREXA) 10 MG tablet TAKE 1 TABLET BY MOUTH DAILY AT BEDTIME 30 tablet 0  . ondansetron (ZOFRAN) 8 MG tablet Take 1 tablet (8 mg total) by mouth every 8 (eight) hours as needed for nausea or vomiting. 45 tablet 2  . OXALIPLATIN IV Inject into the vein.    Marland Kitchen Potassium 99 MG TABS Take 99 mg by mouth daily.    . pravastatin (PRAVACHOL) 20 MG tablet Take 20 mg by mouth daily.   1  . topiramate (TOPAMAX) 100 MG tablet Take 100 mg by mouth daily.     . Multiple Vitamin (MULTIVITAMIN) tablet Take 1 tablet by mouth daily.  (Patient not taking: Reported on 11/22/2019)     No current facility-administered medications for this visit.    ALLERGIES:  No Known Allergies  PHYSICAL EXAM:  Performance status (ECOG): 1 - Symptomatic but completely ambulatory  Vitals:   11/22/19 0923  BP: (!) 138/93  Pulse: 92  Resp: 18  Temp: (!) 97.1 F (36.2 C)  SpO2: 98%   Wt Readings from Last 3 Encounters:  11/22/19 237 lb 8 oz (107.7 kg)  10/21/19  227 lb 11.8  oz (103.3 kg)  10/15/19 233 lb (105.7 kg)   Physical Exam Vitals reviewed.  Constitutional:      Appearance: Normal appearance. He is obese.  Cardiovascular:     Rate and Rhythm: Normal rate and regular rhythm.     Pulses: Normal pulses.     Heart sounds: Normal heart sounds.  Pulmonary:     Effort: Pulmonary effort is normal.     Breath sounds: Normal breath sounds.  Chest:     Comments: Port-a-Cath on L chest Abdominal:     Palpations: Abdomen is soft. There is no mass.     Tenderness: There is no abdominal tenderness.  Neurological:     General: No focal deficit present.     Mental Status: He is alert and oriented to person, place, and time.  Psychiatric:        Mood and Affect: Mood normal.        Behavior: Behavior normal.     LABORATORY DATA:  I have reviewed the labs as listed.  CBC Latest Ref Rng & Units 11/22/2019 10/21/2019 10/15/2019  WBC 4.0 - 10.5 K/uL 5.4 6.4 6.4  Hemoglobin 13.0 - 17.0 g/dL 13.8 12.8(L) 13.7  Hematocrit 39 - 52 % 40.8 38.2(L) 41.5  Platelets 150 - 400 K/uL 185 112(L) 191   CMP Latest Ref Rng & Units 11/22/2019 10/21/2019 10/15/2019  Glucose 70 - 99 mg/dL 299(H) 264(H) 222(H)  BUN 6 - 20 mg/dL _0 Creatinine 0.61 - 1.24 mg/dL 0.91 0.85 0.87  Sodium 135 - 145 mmol/L 137 138 138  Potassium 3.5 - 5.1 mmol/L 4.0 3.9 3.9  Chloride 98 - 111 mmol/L 108 107 107  CO2 22 - 32 mmol/L _1 Calcium 8.9 - 10.3 mg/dL 8.6(L) 8.6(L) 8.6(L)  Total Protein 6.5 - 8.1 g/dL 6.7 6.3(L) 6.6  Total Bilirubin 0.3 - 1.2 mg/dL 0.6 0.5 0.4  Alkaline Phos 38 - 126 U/L 43 43 50  AST 15 - 41 U/L 31 36 34  ALT 0 - 44 U/L 58(H) 92(H) 60(H)   Lab Results  Component Value Date   LDH 144 10/21/2019   Lab Results  Component Value Date   CEA1 1.2 08/23/2019   CEA1 2.5 05/10/2019   CEA1 2.1 01/27/2019    DIAGNOSTIC IMAGING:  I have independently reviewed the scans and discussed with the patient. No results found.   ASSESSMENT:  1. Stage III (PT3PN2B)  sigmoid colon adenocarcinoma, MSI stable, status post sigmoid colon resection on 03/02/2018, 14/35 lymph nodes positive. -12 cycles of FOLFOX from 04/21/2018 through 10/06/2018. -Repeat colonoscopy on 06/01/2019 showed recurrence at the anastomotic area, biopsy consistent with adenocarcinoma. -PET scan on 07/05/2019 shows increased uptake in the anastomotic area. There are 2 small 5 mm nodules in the right upper lobe and left upper lobe which are new. These are not hypermetabolic on PET scan given the small size. Patient also had COVID-19 infection earlier this year. -LAR on 07/28/2019, pathology showing recurrent moderately differentiated adenocarcinoma, metastatic carcinoma in 3/6 lymph nodes, margins negative. Grade 2. RPT2, RPN1B. -MMR is preserved. -3 months of CAPEOX started on 09/24/2019.  Dose of Xeloda reduced to 2 tablets twice daily with cycle 2 on 10/15/2019.   PLAN:  1. Recurrent sigmoid colon adenocarcinoma: -I have reviewed his labs today.  LFTs are normal with slightly elevated ALT.  CBC was normal. -He will proceed with cycle 3 of oxaliplatin today.  He will start Xeloda 2 tablets twice  daily for 14 days. -I plan to see him back in 3 weeks prior to his cycle 4 of treatment.  2. Peripheral neuropathy: -He has cold sensitivity lasting all 3 weeks but denies any tingling or numbness.  3. Elevated LFTs: -His ALT has improved to 58.  4.  Nausea/vomiting: -Olanzapine has helped.  However he felt like he was in the space.  He will continue olanzapine for 4 days.  5.  Diarrhea: -This is well controlled with Imodium.   Orders placed this encounter:  No orders of the defined types were placed in this encounter.    Derek Jack, MD [place] [phone]   I, Milinda Antis, am acting as a scribe for Dr. Sanda Linger.  I, Derek Jack MD, have reviewed the above documentation for accuracy and completeness, and I agree with the  above.

## 2019-11-22 NOTE — Progress Notes (Signed)
Patient has been assessed by Dr. Delton Coombes and labs reviewed. Okay to proceed with treatment.

## 2019-11-22 NOTE — Patient Instructions (Signed)
[facility] Discharge Instructions for Patients Receiving Chemotherapy  Today you received the following chemotherapy agents   To help prevent nausea and vomiting after your treatment, we encourage you to take your nausea medication   If you develop nausea and vomiting that is not controlled by your nausea medication, call the clinic.   BELOW ARE SYMPTOMS THAT SHOULD BE REPORTED IMMEDIATELY:  *FEVER GREATER THAN 100.5 F  *CHILLS WITH OR WITHOUT FEVER  NAUSEA AND VOMITING THAT IS NOT CONTROLLED WITH YOUR NAUSEA MEDICATION  *UNUSUAL SHORTNESS OF BREATH  *UNUSUAL BRUISING OR BLEEDING  TENDERNESS IN MOUTH AND THROAT WITH OR WITHOUT PRESENCE OF ULCERS  *URINARY PROBLEMS  *BOWEL PROBLEMS  UNUSUAL RASH Items with * indicate a potential emergency and should be followed up as soon as possible.  Feel free to call the clinic should you have any questions or concerns. The clinic phone number is [phone].  Please show the CHEMO ALERT CARD at check-in to the Emergency Department and triage nurse.   

## 2019-11-22 NOTE — Patient Instructions (Signed)
[facility] at [facility] Discharge Instructions  You were seen today by Dr. Katragadda. He went over your recent results. You received your treatment today. Dr. Katragadda will see you back in 3 weeks for labs and follow up.   Thank you for choosing [facility] at [facility] to provide your oncology and hematology care.  To afford each patient quality time with our provider, please arrive at least 15 minutes before your scheduled appointment time.   If you have a lab appointment with the Cancer Center please come in thru the Main Entrance and check in at the main information desk  You need to re-schedule your appointment should you arrive 10 or more minutes late.  We strive to give you quality time with our providers, and arriving late affects you and other patients whose appointments are after yours.  Also, if you no show three or more times for appointments you may be dismissed from the clinic at the providers discretion.     Again, thank you for choosing [facility].  Our hope is that these requests will decrease the amount of time that you wait before being seen by our physicians.       _____________________________________________________________  Should you have questions after your visit to [facility], please contact our office at [phone] between the hours of 8:00 a.m. and 4:30 p.m.  Voicemails left after 4:00 p.m. will not be returned until the following business day.  For prescription refill requests, have your pharmacy contact our office and allow 72 hours.    Cancer Center Support Programs:   > Cancer Support Group  2nd Tuesday of the month 1pm-2pm, Journey Room    

## 2019-11-22 NOTE — Progress Notes (Signed)
Patient is taking xeloda and has not missed any doses and reports only nausea as side effect at this time.    Labs reviewed with Dr Raliegh Ip and okay to proceed with treatment.   Joel Jefferson. tolerated treatment well today without incidence.  Discharged ambulatory. Vital signs WNL prior to discharge.

## 2019-12-15 ENCOUNTER — Inpatient Hospital Stay (HOSPITAL_COMMUNITY): Payer: No Typology Code available for payment source

## 2019-12-15 ENCOUNTER — Inpatient Hospital Stay (HOSPITAL_BASED_OUTPATIENT_CLINIC_OR_DEPARTMENT_OTHER): Payer: No Typology Code available for payment source | Admitting: Hematology

## 2019-12-15 ENCOUNTER — Encounter (HOSPITAL_COMMUNITY): Payer: Self-pay | Admitting: Hematology

## 2019-12-15 ENCOUNTER — Inpatient Hospital Stay (HOSPITAL_COMMUNITY): Payer: No Typology Code available for payment source | Attending: Hematology

## 2019-12-15 ENCOUNTER — Other Ambulatory Visit: Payer: Self-pay

## 2019-12-15 VITALS — BP 130/87 | HR 86 | Temp 96.9°F | Resp 18

## 2019-12-15 VITALS — BP 134/89 | HR 104 | Temp 96.9°F | Resp 18 | Wt 232.6 lb

## 2019-12-15 DIAGNOSIS — R945 Abnormal results of liver function studies: Secondary | ICD-10-CM | POA: Insufficient documentation

## 2019-12-15 DIAGNOSIS — Z5111 Encounter for antineoplastic chemotherapy: Secondary | ICD-10-CM | POA: Insufficient documentation

## 2019-12-15 DIAGNOSIS — C187 Malignant neoplasm of sigmoid colon: Secondary | ICD-10-CM

## 2019-12-15 DIAGNOSIS — C186 Malignant neoplasm of descending colon: Secondary | ICD-10-CM | POA: Diagnosis not present

## 2019-12-15 DIAGNOSIS — R197 Diarrhea, unspecified: Secondary | ICD-10-CM | POA: Insufficient documentation

## 2019-12-15 DIAGNOSIS — R112 Nausea with vomiting, unspecified: Secondary | ICD-10-CM | POA: Diagnosis not present

## 2019-12-15 DIAGNOSIS — G629 Polyneuropathy, unspecified: Secondary | ICD-10-CM | POA: Insufficient documentation

## 2019-12-15 LAB — COMPREHENSIVE METABOLIC PANEL
ALT: 42 U/L (ref 0–44)
AST: 26 U/L (ref 15–41)
Albumin: 4 g/dL (ref 3.5–5.0)
Alkaline Phosphatase: 53 U/L (ref 38–126)
Anion gap: 10 (ref 5–15)
BUN: 14 mg/dL (ref 6–20)
CO2: 23 mmol/L (ref 22–32)
Calcium: 9.1 mg/dL (ref 8.9–10.3)
Chloride: 106 mmol/L (ref 98–111)
Creatinine, Ser: 0.87 mg/dL (ref 0.61–1.24)
GFR calc Af Amer: >60 mL/min (ref >60)
GFR calc non Af Amer: >60 mL/min (ref >60)
Glucose, Bld: 332 mg/dL — ABNORMAL HIGH (ref 70–99)
Potassium: 4 mmol/L (ref 3.5–5.1)
Sodium: 139 mmol/L (ref 135–145)
Total Bilirubin: 0.9 mg/dL (ref 0.3–1.2)
Total Protein: 6.5 g/dL (ref 6.5–8.1)

## 2019-12-15 LAB — CBC WITH DIFFERENTIAL/PLATELET
Abs Immature Granulocytes: 0.03 10*3/uL (ref 0.00–0.07)
Basophils Absolute: 0 10*3/uL (ref 0.0–0.1)
Basophils Relative: 1 %
Eosinophils Absolute: 0.3 10*3/uL (ref 0.0–0.5)
Eosinophils Relative: 6 %
HCT: 42.5 % (ref 39.0–52.0)
Hemoglobin: 14.2 g/dL (ref 13.0–17.0)
Immature Granulocytes: 1 %
Lymphocytes Relative: 32 %
Lymphs Abs: 1.8 10*3/uL (ref 0.7–4.0)
MCH: 29.4 pg (ref 26.0–34.0)
MCHC: 33.4 g/dL (ref 30.0–36.0)
MCV: 88 fL (ref 80.0–100.0)
Monocytes Absolute: 0.3 10*3/uL (ref 0.1–1.0)
Monocytes Relative: 6 %
Neutro Abs: 3 10*3/uL (ref 1.7–7.7)
Neutrophils Relative %: 54 %
Platelets: 165 10*3/uL (ref 150–400)
RBC: 4.83 MIL/uL (ref 4.22–5.81)
RDW: 17 % — ABNORMAL HIGH (ref 11.5–15.5)
WBC: 5.5 10*3/uL (ref 4.0–10.5)
nRBC: 0 % (ref 0.0–0.2)

## 2019-12-15 MED ORDER — SODIUM CHLORIDE 0.9% FLUSH
10.0000 mL | INTRAVENOUS | Status: DC | PRN
  Administered 2019-12-15: 10 mL

## 2019-12-15 MED ORDER — HEPARIN SOD (PORK) LOCK FLUSH 100 UNIT/ML IV SOLN
500.0000 [IU] | Freq: Once | INTRAVENOUS | Status: AC | PRN
  Administered 2019-12-15: 500 [IU]

## 2019-12-15 MED ORDER — SODIUM CHLORIDE 0.9 % IV SOLN
10.0000 mg | Freq: Once | INTRAVENOUS | Status: AC
  Administered 2019-12-15: 10 mg via INTRAVENOUS
  Filled 2019-12-15: qty 10

## 2019-12-15 MED ORDER — DEXTROSE 5 % IV SOLN: Freq: Once | INTRAVENOUS | Status: AC

## 2019-12-15 MED ORDER — SODIUM CHLORIDE 0.9 % IV SOLN
150.0000 mg | Freq: Once | INTRAVENOUS | Status: AC
  Administered 2019-12-15: 150 mg via INTRAVENOUS
  Filled 2019-12-15: qty 150

## 2019-12-15 MED ORDER — OXALIPLATIN CHEMO INJECTION 100 MG/20ML
130.0000 mg/m2 | Freq: Once | INTRAVENOUS | Status: AC
  Administered 2019-12-15: 300 mg via INTRAVENOUS
  Filled 2019-12-15: qty 60

## 2019-12-15 MED ORDER — PALONOSETRON HCL INJECTION 0.25 MG/5ML
0.2500 mg | Freq: Once | INTRAVENOUS | Status: AC
  Administered 2019-12-15: 0.25 mg via INTRAVENOUS
  Filled 2019-12-15: qty 5

## 2019-12-15 NOTE — Patient Instructions (Signed)
[place] at [facility] Discharge Instructions  You were seen today by Dr. Delton Coombes. He went over your recent results. You received your treatment today. You will be scheduled for a CT scan of your chest and abdomen before your next visit. Dr. Delton Coombes will see you back in 4 weeks for labs and follow up.   Thank you for choosing [place] at [facility] to provide your oncology and hematology care.  To afford each patient quality time with our provider, please arrive at least 15 minutes before your scheduled appointment time.   If you have a lab appointment with the [place] please come in thru the Main Entrance and check in at the main information desk  You need to re-schedule your appointment should you arrive 10 or more minutes late.  We strive to give you quality time with our providers, and arriving late affects you and other patients whose appointments are after yours.  Also, if you no show three or more times for appointments you may be dismissed from the clinic at the providers discretion.     Again, thank you for choosing [facility].  Our hope is that these requests will decrease the amount of time that you wait before being seen by our physicians.       _____________________________________________________________  Should you have questions after your visit to [facility], please contact our office at [phone] between the hours of 8:00 a.m. and 4:30 p.m.  Voicemails left after 4:00 p.m. will not be returned until the following business day.  For prescription refill requests, have your pharmacy contact our office and allow 72 hours.    Cancer Center Support Programs:   > Cancer Support Group  2nd Tuesday of the month 1pm-2pm, Journey Room

## 2019-12-15 NOTE — Progress Notes (Signed)
Labs reviewed, will proceed with treatment per MD.

## 2019-12-15 NOTE — Patient Instructions (Signed)
[facility] Discharge Instructions for Patients Receiving Chemotherapy  Today you received the following chemotherapy agents   To help prevent nausea and vomiting after your treatment, we encourage you to take your nausea medication   If you develop nausea and vomiting that is not controlled by your nausea medication, call the clinic.   BELOW ARE SYMPTOMS THAT SHOULD BE REPORTED IMMEDIATELY:  *FEVER GREATER THAN 100.5 F  *CHILLS WITH OR WITHOUT FEVER  NAUSEA AND VOMITING THAT IS NOT CONTROLLED WITH YOUR NAUSEA MEDICATION  *UNUSUAL SHORTNESS OF BREATH  *UNUSUAL BRUISING OR BLEEDING  TENDERNESS IN MOUTH AND THROAT WITH OR WITHOUT PRESENCE OF ULCERS  *URINARY PROBLEMS  *BOWEL PROBLEMS  UNUSUAL RASH Items with * indicate a potential emergency and should be followed up as soon as possible.  Feel free to call the clinic should you have any questions or concerns. The clinic phone number is [phone].  Please show the CHEMO ALERT CARD at check-in to the Emergency Department and triage nurse.   

## 2019-12-15 NOTE — Progress Notes (Signed)
[place], [place] [address]   CLINIC:  Medical Oncology/Hematology  PCP:  Sharilyn Sites, [address] [place] / [place] [address] [phone]   REASON FOR VISIT:  Follow-up for recurrent colon cancer  PRIOR THERAPY: FOLFOX x 12 cycles from 04/21/2018 to 10/06/2018  NGS Results: [facility] 1 MS--stable  CURRENT THERAPY: Oxaliplatin every 3 weeks  BRIEF ONCOLOGIC HISTORY:  Oncology History  Malignant neoplasm of sigmoid colon [facility])   Initial Diagnosis   Cancer of sigmoid colon ([place])   03/27/2018 Genetic Testing   ALK c.350C>G VUS identified on the multicancer panel.  The Multi-Gene Panel offered by [facility] includes sequencing and/or deletion duplication testing of the following 85 genes: AIP, ALK, APC, ATM, AXIN2,BAP1,  BARD1, BLM, BMPR1A, BRCA1, BRCA2, BRIP1, CASR, CDC73, CDH1, CDK4, CDKN1B, CDKN1C, CDKN2A (p14ARF), CDKN2A (p16INK4a), CEBPA, CHEK2, CTNNA1, DICER1, DIS3L2, EGFR (c.2369C>T, p.Thr790Met variant only), EPCAM (Deletion/duplication testing only), FH, FLCN, GATA2, GPC3, GREM1 (Promoter region deletion/duplication testing only), HOXB13 (c.251G>A, p.Gly84Glu), HRAS, KIT, MAX, MEN1, MET, MITF (c.952G>A, p.Glu318Lys variant only), MLH1, MSH2, MSH3, MSH6, MUTYH, NBN, NF1, NF2, NTHL1, PALB2, PDGFRA, PHOX2B, PMS2, POLD1, POLE, POT1, PRKAR1A, PTCH1, PTEN, RAD50, RAD51C, RAD51D, RB1, RECQL4, RET, RNF43, RUNX1, SDHAF2, SDHA (sequence changes only), SDHB, SDHC, SDHD, SMAD4, SMARCA4, SMARCB1, SMARCE1, STK11, SUFU, TERC, TERT, TMEM127, TP53, TSC1, TSC2, VHL, WRN and WT1.   The report date is 03/27/2018.   04/21/2018 - 10/08/2018 Chemotherapy   The patient had palonosetron (ALOXI) injection 0.25 mg, 0.25 mg, Intravenous,  Once, 13 of 13 cycles Administration: 0.25 mg (04/21/2018), 0.25 mg (05/05/2018), 0.25 mg (05/19/2018), 0.25 mg (06/02/2018), 0.25 mg (06/17/2018), 0.25 mg (06/30/2018), 0.25 mg (07/13/2018), 0.25 mg (08/11/2018), 0.25 mg (08/25/2018),  0.25 mg (09/08/2018), 0.25 mg (09/22/2018), 0.25 mg (10/06/2018) leucovorin 800 mg in dextrose 5 % 250 mL infusion, 876 mg, Intravenous,  Once, 13 of 13 cycles Administration: 800 mg (04/21/2018), 900 mg (05/05/2018), 800 mg (05/19/2018), 800 mg (06/02/2018), 900 mg (06/17/2018), 900 mg (06/30/2018), 900 mg (07/13/2018), 900 mg (08/11/2018), 900 mg (08/25/2018), 876 mg (09/22/2018), 900 mg (09/08/2018), 876 mg (10/06/2018) oxaliplatin (ELOXATIN) 185 mg in dextrose 5 % 500 mL chemo infusion, 85 mg/m2 = 185 mg, Intravenous,  Once, 13 of 13 cycles Dose modification: 68 mg/m2 (80 % of original dose 85 mg/m2, Cycle 12, Reason: Other (see comments), Comment: neuropathy), 42.5 mg/m2 (50 % of original dose 85 mg/m2, Cycle 13, Reason: Provider Judgment) Administration: 185 mg (04/21/2018), 185 mg (05/05/2018), 185 mg (05/19/2018), 185 mg (06/02/2018), 185 mg (06/17/2018), 185 mg (06/30/2018), 185 mg (07/13/2018), 185 mg (08/11/2018), 185 mg (08/25/2018), 185 mg (09/08/2018), 150 mg (09/22/2018), 95 mg (10/06/2018) fluorouracil (ADRUCIL) chemo injection 900 mg, 400 mg/m2 = 900 mg, Intravenous,  Once, 13 of 13 cycles Administration: 900 mg (04/21/2018), 900 mg (05/05/2018), 900 mg (05/19/2018), 900 mg (06/02/2018), 900 mg (06/17/2018), 900 mg (06/30/2018), 900 mg (07/13/2018), 900 mg (08/11/2018), 900 mg (08/25/2018), 900 mg (09/08/2018), 900 mg (09/22/2018), 900 mg (10/06/2018) fosaprepitant (EMEND) 150 mg, dexamethasone (DECADRON) 12 mg in sodium chloride 0.9 % 145 mL IVPB, , Intravenous,  Once, 10 of 10 cycles Administration:  (06/02/2018),  (06/17/2018),  (06/30/2018),  (07/13/2018),  (08/11/2018),  (08/25/2018),  (09/08/2018),  (09/22/2018),  (10/06/2018) fluorouracil (ADRUCIL) 5,250 mg in sodium chloride 0.9 % 145 mL chemo infusion, 2,400 mg/m2 = 5,250 mg, Intravenous, 1 Day/Dose, 13 of 13 cycles Administration: 5,250 mg (04/21/2018), 5,250 mg (05/05/2018), 5,250 mg (05/19/2018), 5,250 mg (06/02/2018), 5,250 mg (06/17/2018), 5,250 mg (06/30/2018), 5,250 mg (07/13/2018),  5,250  mg (08/11/2018), 5,250 mg (08/25/2018), 5,250 mg (09/08/2018), 5,250 mg (09/22/2018), 5,250 mg (10/06/2018)  for chemotherapy treatment.    09/24/2019 -  Chemotherapy   The patient had capecitabine (XELODA) 500 MG tablet, 850 mg/m2 = 2,000 mg, Oral, 2 times daily after meals, 1 of 1 cycle, Start date: 09/17/2019, End date: -- palonosetron (ALOXI) injection 0.25 mg, 0.25 mg, Intravenous,  Once, 3 of 4 cycles Administration: 0.25 mg (09/24/2019), 0.25 mg (10/15/2019), 0.25 mg (11/22/2019) oxaliplatin (ELOXATIN) 300 mg in dextrose 5 % 500 mL chemo infusion, 130 mg/m2 = 300 mg, Intravenous,  Once, 3 of 4 cycles Administration: 300 mg (09/24/2019), 300 mg (10/15/2019), 300 mg (11/22/2019) fosaprepitant (EMEND) 150 mg in sodium chloride 0.9 % 145 mL IVPB, 150 mg, Intravenous,  Once, 1 of 2 cycles Administration: 150 mg (11/22/2019)  for chemotherapy treatment.    Malignant neoplasm of descending colon ([place])  07/28/2019 Initial Diagnosis   Malignant neoplasm of descending colon ([place])   09/24/2019 -  Chemotherapy   The patient had capecitabine (XELODA) 500 MG tablet, 850 mg/m2 = 2,000 mg, Oral, 2 times daily after meals, 1 of 1 cycle, Start date: 09/17/2019, End date: -- palonosetron (ALOXI) injection 0.25 mg, 0.25 mg, Intravenous,  Once, 3 of 4 cycles Administration: 0.25 mg (09/24/2019), 0.25 mg (10/15/2019), 0.25 mg (11/22/2019) oxaliplatin (ELOXATIN) 300 mg in dextrose 5 % 500 mL chemo infusion, 130 mg/m2 = 300 mg, Intravenous,  Once, 3 of 4 cycles Administration: 300 mg (09/24/2019), 300 mg (10/15/2019), 300 mg (11/22/2019) fosaprepitant (EMEND) 150 mg in sodium chloride 0.9 % 145 mL IVPB, 150 mg, Intravenous,  Once, 1 of 2 cycles Administration: 150 mg (11/22/2019)  for chemotherapy treatment.      CANCER STAGING: Cancer Staging No matching staging information was found for the patient.  INTERVAL HISTORY:  Joel Jefferson., a 44 y.o. male, returns for routine follow-up and consideration for next cycle of  chemotherapy. Joel Jefferson was last seen on 11/22/2019.  Due for cycle #4 of oxaliplatin and Aloxi today.   Overall, he tells me he has been feeling pretty well. He reports that he tolerated the previous treatment well. He denies numbness or tingling of his fingertips, that he does get cold sensitivity for 4-5 days after the chemo, and denies having mouth sores. His appetite is good.  Overall, he feels ready for next cycle of chemo today.    REVIEW OF SYSTEMS:  Review of Systems  All other systems reviewed and are negative.   PAST MEDICAL/SURGICAL HISTORY:  Past Medical History:  Diagnosis Date  . Diabetes mellitus without complication ([place])   . Family history of breast cancer   . GERD (gastroesophageal reflux disease)   . History of kidney stones   . Hypercholesterolemia   . Hypertension   . Urethral stricture    Past Surgical History:  Procedure Laterality Date  . BALLOON DILATION  04/06/2012   Procedure: BALLOON DILATION;  Surgeon: Reece Packer, MD;  Location: [facility];  Service: Urology;  Laterality: N/A;  . BIOPSY  02/24/2018   Procedure: BIOPSY;  Surgeon: Danie Binder, MD;  [facility];  Service: Endoscopy;;  colon  . BIOPSY  06/01/2019   Procedure: BIOPSY;  Surgeon: Danie Binder, MD;  [facility];  Service: Endoscopy;;  . BOWEL RESECTION N/A 07/28/2019   Procedure: LOW ANTERIOR BOWEL RESECTION;  Surgeon: Aviva Signs, MD;  Location: [facility];  Service: General;  Laterality: N/A;  . COLONOSCOPY  WITH PROPOFOL N/A 02/24/2018   Dr. Oneida Alar: fungating, infiltrative partially obstructing mass in sigmoid colon, area tattooed. ext/int hemorrhoids. Bx: adenocarcinoma  . COLONOSCOPY WITH PROPOFOL N/A 06/01/2019   Procedure: COLONOSCOPY WITH PROPOFOL;  Surgeon: Danie Binder, MD;  [facility];  Service: Endoscopy;  Laterality: N/A;  9:30am  . CYSTOSCOPY WITH HOLMIUM LASER LITHOTRIPSY  08/07/2019   Procedure: CYSTOSCOPY WITH  HOLMIUM LASER LITHOTRIPSY;  Surgeon: Festus Aloe, MD;  Location: [facility];  Service: Urology;;  . CYSTOSCOPY/RETROGRADE/URETEROSCOPY/STONE EXTRACTION WITH BASKET Left 08/07/2019   Procedure: CYSTOSCOPY/RETROGRADE/URETEROSCOPY/STONE EXTRACTION WITH BASKET;  Surgeon: Festus Aloe, MD;  Location: [facility];  Service: Urology;  Laterality: Left;  . ESOPHAGOGASTRODUODENOSCOPY (EGD) WITH PROPOFOL N/A 08/29/2015   Dr. Oneida Alar: possible proximal esophageal web s/p dilation, moderate gastritis, negative eosinophilic esophagitis   . ESOPHAGOGASTRODUODENOSCOPY (EGD) WITH PROPOFOL  06/01/2019   Procedure: ESOPHAGOGASTRODUODENOSCOPY (EGD) WITH PROPOFOL;  Surgeon: Danie Binder, MD;  [facility];  Service: Endoscopy;;  . PARTIAL COLECTOMY N/A 03/02/2018   Procedure: PARTIAL COLECTOMY;  Surgeon: Aviva Signs, MD;  Location: [facility];  Service: General;  Laterality: N/A;  . PORTACATH PLACEMENT Left 04/13/2018   Procedure: INSERTION PORT-A-CATH;  Surgeon: Aviva Signs, MD;  Location: [facility];  Service: General;  Laterality: Left;  . SAVORY DILATION N/A 08/29/2015   Procedure: SAVORY DILATION;  Surgeon: Danie Binder, MD;  [facility];  Service: Endoscopy;  Laterality: N/A;  . URETHRAL DILATION    . WISDOM TOOTH EXTRACTION      SOCIAL HISTORY:  Social History   Socioeconomic History  . Marital status: Divorced    Spouse name: Not on file  . Number of children: Not on file  . Years of education: Not on file  . Highest education level: Not on file  Occupational History  . Not on file  Tobacco Use  . Smoking status: Former Smoker    Packs/day: 0.25    Years: 9.00    Pack years: 2.25    Types: Cigarettes    Quit date: 07/27/2011    Years since quitting: 8.3  . Smokeless tobacco: Never Used  Vaping Use  . Vaping Use: Never used  Substance and Sexual Activity  . Alcohol use: Yes    Alcohol/week: 6.0 standard drinks    Types: 6 Cans of beer per week  . Drug use: No  . Sexual  activity: Yes    Birth control/protection: None  Other Topics Concern  . Not on file  Social History Narrative  . Not on file   Social Determinants of Health   Financial Resource Strain:   . Difficulty of Paying Living Expenses: Not on file  Food Insecurity:   . Worried About Charity fundraiser in the Last Year: Not on file  . Ran Out of Food in the Last Year: Not on file  Transportation Needs:   . Lack of Transportation (Medical): Not on file  . Lack of Transportation (Non-Medical): Not on file  Physical Activity:   . Days of Exercise per Week: Not on file  . Minutes of Exercise per Session: Not on file  Stress:   . Feeling of Stress : Not on file  Social Connections:   . Frequency of Communication with Friends and Family: Not on file  . Frequency of Social Gatherings with Friends and Family: Not on file  . Attends Religious Services: Not on file  . Active Member of Clubs or Organizations: Not on file  . Attends  Club or Organization Meetings: Not on file  . Marital Status: Not on file  Intimate Partner Violence:   . Fear of Current or Ex-Partner: Not on file  . Emotionally Abused: Not on file  . Physically Abused: Not on file  . Sexually Abused: Not on file    FAMILY HISTORY:  Family History  Problem Relation Age of Onset  . Dementia Mother   . Dementia Maternal Aunt   . Dementia Maternal Grandmother   . COPD Maternal Grandfather   . Breast cancer Maternal Aunt 42  . Breast cancer Maternal Aunt        dx in her 64s  . Lung cancer Maternal Aunt   . Healthy Son   . Colon cancer Neg Hx     CURRENT MEDICATIONS:  Current Outpatient Medications  Medication Sig Dispense Refill  . bismuth subsalicylate (PEPTO BISMOL) 262 MG/15ML suspension Take 30 mLs by mouth every 6 (six) hours as needed.    . capecitabine (XELODA) 500 MG tablet Take 4 tablets (2,000 mg total) by mouth 2 (two) times daily after a meal. Take for 14 days, then hold for 7 days. Repeat every 21 days.  (Patient taking differently: Take 850 mg/m2 by mouth 2 (two) times daily after a meal. Take for 14 days, then hold for 7 days. Repeat every 21 days.  Take 2 tabs in the morning and 3 tabs at night) 112 tablet 3  . CONTOUR NEXT TEST test strip USE TO CHECK BLOOD SUGAR BID    . famotidine (PEPCID) 40 MG tablet Take 40 mg by mouth daily.    Marland Kitchen glipiZIDE (GLUCOTROL XL) 5 MG 24 hr tablet Take two tablets daily until you see Dr. Hilma Favors (Patient taking differently: 10 mg in the morning and at bedtime. ) 30 tablet 3  . lidocaine-prilocaine (EMLA) cream Apply small amount to port site one hour prior to appointment. Cover with plastic wrap 30 g 3  . LORazepam (ATIVAN) 0.5 MG tablet Take 1 tablet (0.5 mg total) by mouth every 8 (eight) hours. 30 tablet 2  . losartan (COZAAR) 100 MG tablet Take 100 mg by mouth daily.    . magnesium oxide (MAG-OX) 400 MG tablet Take 400 mg by mouth daily.    . metFORMIN (GLUCOPHAGE) 500 MG tablet Take 500 mg by mouth 2 (two) times daily with a meal.    . Multiple Vitamin (MULTIVITAMIN) tablet Take 1 tablet by mouth daily.     Marland Kitchen OLANZapine (ZYPREXA) 10 MG tablet TAKE 1 TABLET BY MOUTH DAILY AT BEDTIME 30 tablet 0  . ondansetron (ZOFRAN) 8 MG tablet Take 1 tablet (8 mg total) by mouth every 8 (eight) hours as needed for nausea or vomiting. 45 tablet 2  . OXALIPLATIN IV Inject into the vein.    Marland Kitchen Potassium 99 MG TABS Take 99 mg by mouth daily.    . pravastatin (PRAVACHOL) 20 MG tablet Take 20 mg by mouth daily.   1  . topiramate (TOPAMAX) 100 MG tablet Take 100 mg by mouth daily.      No current facility-administered medications for this visit.    ALLERGIES:  No Known Allergies  PHYSICAL EXAM:  Performance status (ECOG): 1 - Symptomatic but completely ambulatory  Vitals:   12/15/19 1017  BP: 134/89  Pulse: (!) 104  Resp: 18  Temp: (!) 96.9 F (36.1 C)  SpO2: 95%   Wt Readings from Last 3 Encounters:  12/15/19 232 lb 9.6 oz (105.5 kg)  11/22/19 237 lb 8  oz (107.7  kg)  10/21/19 227 lb 11.8 oz (103.3 kg)   Physical Exam Vitals reviewed.  Constitutional:      Appearance: Normal appearance. He is obese.  Cardiovascular:     Rate and Rhythm: Normal rate and regular rhythm.     Pulses: Normal pulses.     Heart sounds: Normal heart sounds.  Pulmonary:     Effort: Pulmonary effort is normal.     Breath sounds: Normal breath sounds.  Chest:     Comments: Port-a-Cath in L chest Abdominal:     Palpations: Abdomen is soft. There is no mass.     Tenderness: There is no abdominal tenderness.  Musculoskeletal:     Right lower leg: No edema.     Left lower leg: No edema.  Neurological:     General: No focal deficit present.     Mental Status: He is alert and oriented to person, place, and time.  Psychiatric:        Mood and Affect: Mood normal.        Behavior: Behavior normal.     LABORATORY DATA:  I have reviewed the labs as listed.  CBC Latest Ref Rng & Units 12/15/2019 11/22/2019 10/21/2019  WBC 4.0 - 10.5 K/uL 5.5 5.4 6.4  Hemoglobin 13.0 - 17.0 g/dL 14.2 13.8 12.8(L)  Hematocrit 39 - 52 % 42.5 40.8 38.2(L)  Platelets 150 - 400 K/uL 165 185 112(L)   CMP Latest Ref Rng & Units 12/15/2019 11/22/2019 10/21/2019  Glucose 70 - 99 mg/dL 332(H) 299(H) 264(H)  BUN 6 - 20 mg/dL '14 13 12  ' Creatinine 0.61 - 1.24 mg/dL 0.87 0.91 0.85  Sodium 135 - 145 mmol/L 139 137 138  Potassium 3.5 - 5.1 mmol/L 4.0 4.0 3.9  Chloride 98 - 111 mmol/L 106 108 107  CO2 22 - 32 mmol/L '23 22 24  ' Calcium 8.9 - 10.3 mg/dL 9.1 8.6(L) 8.6(L)  Total Protein 6.5 - 8.1 g/dL 6.5 6.7 6.3(L)  Total Bilirubin 0.3 - 1.2 mg/dL 0.9 0.6 0.5  Alkaline Phos 38 - 126 U/L 53 43 43  AST 15 - 41 U/L 26 31 36  ALT 0 - 44 U/L 42 58(H) 92(H)   Lab Results  Component Value Date   LDH 144 10/21/2019   Lab Results  Component Value Date   CEA1 1.2 08/23/2019   CEA1 2.5 05/10/2019   CEA1 2.1 01/27/2019    DIAGNOSTIC IMAGING:  I have independently reviewed the scans and discussed with the  patient. No results found.   ASSESSMENT:  1. Stage III (PT3PN2B) sigmoid colon adenocarcinoma, MSI stable, status post sigmoid colon resection on 03/02/2018, 14/35 lymph nodes positive. -12 cycles of FOLFOX from 04/21/2018 through 10/06/2018. -Repeat colonoscopy on 06/01/2019 showed recurrence at the anastomotic area, biopsy consistent with adenocarcinoma. -PET scan on 07/05/2019 shows increased uptake in the anastomotic area. There are 2 small 5 mm nodules in the right upper lobe and left upper lobe which are new. These are not hypermetabolic on PET scan given the small size. Patient also had COVID-19 infection earlier this year. -LAR on 07/28/2019, pathology showing recurrent moderately differentiated adenocarcinoma, metastatic carcinoma in 3/6 lymph nodes, margins negative. Grade 2. RPT2, RPN1B. -MMR is preserved. -3 months ofCAPEOXstarted on 09/24/2019. Dose of Xeloda reduced to 2 tablets twice daily with cycle 2 on 10/15/2019.   PLAN:  1. Recurrent sigmoid colon adenocarcinoma: -He tolerated last cycle reasonably well. -Reviewed his LFTs which are within normal limits.  CBC also normal. -Proceed  with last cycle of chemotherapy.  He will start Xeloda 2 tablets twice daily today for 2 weeks. -RTC 4 weeks with CT CAP, CEA.  2. Peripheral neuropathy: -He has cold sensitivity but denies any tingling or numbness.  No dose reduction needed.  3. Elevated LFTs: -Elevated ALT has normalized.  4. Nausea/vomiting: -He will continue olanzapine for 5 days.  5. Diarrhea: -This is well controlled with Imodium.   Orders placed this encounter:  Orders Placed This Encounter  Procedures  . CT CHEST ABDOMEN PELVIS W CONTRAST     Derek Jack, MD [place] [phone]   I, Milinda Antis, am acting as a scribe for Dr. Sanda Linger.  I, Derek Jack MD, have reviewed the above documentation for accuracy and completeness, and I agree with the  above.

## 2019-12-15 NOTE — Progress Notes (Signed)
Patient was assessed by Dr. Delton Coombes and labs have been reviewed.  Patient is okay to proceed with treatment today. Primary RN and pharmacy aware.   Orders placed for CT CAP and labs in 4 weeks

## 2019-12-15 NOTE — Progress Notes (Signed)
Tolerated treatment well today without incidence. Vital signs stable prior to discharge.  Discharged ambulatory.   Patient is taking Xeloda and has not missed any doses and reports no side effects at this time.

## 2019-12-16 LAB — CEA: CEA: 4.2 ng/mL (ref 0.0–4.7)

## 2019-12-16 MED ORDER — CYANOCOBALAMIN 1000 MCG/ML IJ SOLN
INTRAMUSCULAR | Status: AC
  Filled 2019-12-16: qty 1

## 2019-12-16 MED ORDER — LANREOTIDE ACETATE 120 MG/0.5ML ~~LOC~~ SOLN
SUBCUTANEOUS | Status: AC
  Filled 2019-12-16: qty 120

## 2020-01-10 ENCOUNTER — Ambulatory Visit (HOSPITAL_COMMUNITY)
Admission: RE | Admit: 2020-01-10 | Discharge: 2020-01-10 | Disposition: A | Discharge status: Home / Self Care | Payer: No Typology Code available for payment source | Source: Ambulatory Visit | Attending: Hematology | Admitting: Hematology

## 2020-01-10 ENCOUNTER — Inpatient Hospital Stay (HOSPITAL_COMMUNITY): Payer: No Typology Code available for payment source | Attending: Hematology

## 2020-01-10 ENCOUNTER — Other Ambulatory Visit: Payer: Self-pay

## 2020-01-10 DIAGNOSIS — C186 Malignant neoplasm of descending colon: Secondary | ICD-10-CM | POA: Insufficient documentation

## 2020-01-10 DIAGNOSIS — R945 Abnormal results of liver function studies: Secondary | ICD-10-CM | POA: Insufficient documentation

## 2020-01-10 DIAGNOSIS — C187 Malignant neoplasm of sigmoid colon: Secondary | ICD-10-CM

## 2020-01-10 DIAGNOSIS — G629 Polyneuropathy, unspecified: Secondary | ICD-10-CM | POA: Insufficient documentation

## 2020-01-10 LAB — COMPREHENSIVE METABOLIC PANEL
ALT: 46 U/L — ABNORMAL HIGH (ref 0–44)
AST: 35 U/L (ref 15–41)
Albumin: 4.4 g/dL (ref 3.5–5.0)
Alkaline Phosphatase: 45 U/L (ref 38–126)
Anion gap: 12 (ref 5–15)
BUN: 16 mg/dL (ref 6–20)
CO2: 21 mmol/L — ABNORMAL LOW (ref 22–32)
Calcium: 9.4 mg/dL (ref 8.9–10.3)
Chloride: 104 mmol/L (ref 98–111)
Creatinine, Ser: 0.86 mg/dL (ref 0.61–1.24)
GFR calc Af Amer: >60 mL/min (ref >60)
GFR calc non Af Amer: >60 mL/min (ref >60)
Glucose, Bld: 164 mg/dL — ABNORMAL HIGH (ref 70–99)
Potassium: 4.4 mmol/L (ref 3.5–5.1)
Sodium: 137 mmol/L (ref 135–145)
Total Bilirubin: 0.8 mg/dL (ref 0.3–1.2)
Total Protein: 7.1 g/dL (ref 6.5–8.1)

## 2020-01-10 LAB — CBC WITH DIFFERENTIAL/PLATELET
Abs Immature Granulocytes: 0.02 10*3/uL (ref 0.00–0.07)
Basophils Absolute: 0 10*3/uL (ref 0.0–0.1)
Basophils Relative: 1 %
Eosinophils Absolute: 0.2 10*3/uL (ref 0.0–0.5)
Eosinophils Relative: 4 %
HCT: 44.3 % (ref 39.0–52.0)
Hemoglobin: 15.5 g/dL (ref 13.0–17.0)
Immature Granulocytes: 0 %
Lymphocytes Relative: 32 %
Lymphs Abs: 1.7 10*3/uL (ref 0.7–4.0)
MCH: 31 pg (ref 26.0–34.0)
MCHC: 35 g/dL (ref 30.0–36.0)
MCV: 88.6 fL (ref 80.0–100.0)
Monocytes Absolute: 0.4 10*3/uL (ref 0.1–1.0)
Monocytes Relative: 7 %
Neutro Abs: 3 10*3/uL (ref 1.7–7.7)
Neutrophils Relative %: 56 %
Platelets: 135 10*3/uL — ABNORMAL LOW (ref 150–400)
RBC: 5 MIL/uL (ref 4.22–5.81)
RDW: 16.2 % — ABNORMAL HIGH (ref 11.5–15.5)
WBC: 5.4 10*3/uL (ref 4.0–10.5)
nRBC: 0 % (ref 0.0–0.2)

## 2020-01-10 MED ORDER — IOHEXOL 300 MG/ML  SOLN
100.0000 mL | Freq: Once | INTRAMUSCULAR | Status: AC | PRN
  Administered 2020-01-10: 100 mL via INTRAVENOUS

## 2020-01-11 LAB — CEA: CEA: 3.3 ng/mL (ref 0.0–4.7)

## 2020-01-13 ENCOUNTER — Inpatient Hospital Stay (HOSPITAL_BASED_OUTPATIENT_CLINIC_OR_DEPARTMENT_OTHER): Payer: No Typology Code available for payment source | Admitting: Hematology

## 2020-01-13 ENCOUNTER — Telehealth: Payer: Self-pay

## 2020-01-13 ENCOUNTER — Other Ambulatory Visit: Payer: Self-pay

## 2020-01-13 VITALS — BP 134/92 | HR 94 | Temp 96.9°F | Resp 17 | Wt 233.7 lb

## 2020-01-13 DIAGNOSIS — C186 Malignant neoplasm of descending colon: Secondary | ICD-10-CM | POA: Diagnosis present

## 2020-01-13 DIAGNOSIS — G629 Polyneuropathy, unspecified: Secondary | ICD-10-CM | POA: Diagnosis not present

## 2020-01-13 DIAGNOSIS — C187 Malignant neoplasm of sigmoid colon: Secondary | ICD-10-CM

## 2020-01-13 DIAGNOSIS — R945 Abnormal results of liver function studies: Secondary | ICD-10-CM | POA: Diagnosis not present

## 2020-01-13 MED ORDER — CYANOCOBALAMIN 1000 MCG/ML IJ SOLN
INTRAMUSCULAR | Status: AC
  Filled 2020-01-13: qty 1

## 2020-01-13 NOTE — Telephone Encounter (Signed)
He will need an office visit around December. We should have Feb schedule out by then.

## 2020-01-13 NOTE — Telephone Encounter (Signed)
Patient scheduled for December appointment

## 2020-01-13 NOTE — Progress Notes (Signed)
[place], [place] [address]   CLINIC:  Medical Oncology/Hematology  PCP:  Joel Jefferson, [address] / [place] [address] [phone]   REASON FOR VISIT:  Follow-up for recurrent colon cancer  PRIOR THERAPY:  1. Partial colectomy on 03/02/2018. 2. FOLFOX x 13 cycles from 04/21/2018 to 10/06/2018. 3. Oxaliplatin x 4 cycles from 09/24/2019 to 12/15/2019.  NGS Results: Foundation 1 MS--stable  CURRENT THERAPY: Capecitabine 2 weeks on, 1 week off  BRIEF ONCOLOGIC HISTORY:  Oncology History  Malignant neoplasm of sigmoid colon [facility])   Initial Diagnosis   Cancer of sigmoid colon ([place])   03/27/2018 Genetic Testing   ALK c.350C>G VUS identified on the multicancer panel.  The Multi-Gene Panel offered by [facility] includes sequencing and/or deletion duplication testing of the following 85 genes: AIP, ALK, APC, ATM, AXIN2,BAP1,  BARD1, BLM, BMPR1A, BRCA1, BRCA2, BRIP1, CASR, CDC73, CDH1, CDK4, CDKN1B, CDKN1C, CDKN2A (p14ARF), CDKN2A (p16INK4a), CEBPA, CHEK2, CTNNA1, DICER1, DIS3L2, EGFR (c.2369C>T, p.Thr790Met variant only), EPCAM (Deletion/duplication testing only), FH, FLCN, GATA2, GPC3, GREM1 (Promoter region deletion/duplication testing only), HOXB13 (c.251G>A, p.Gly84Glu), HRAS, KIT, MAX, MEN1, MET, MITF (c.952G>A, p.Glu318Lys variant only), MLH1, MSH2, MSH3, MSH6, MUTYH, NBN, NF1, NF2, NTHL1, PALB2, PDGFRA, PHOX2B, PMS2, POLD1, POLE, POT1, PRKAR1A, PTCH1, PTEN, RAD50, RAD51C, RAD51D, RB1, RECQL4, RET, RNF43, RUNX1, SDHAF2, SDHA (sequence changes only), SDHB, SDHC, SDHD, SMAD4, SMARCA4, SMARCB1, SMARCE1, STK11, SUFU, TERC, TERT, TMEM127, TP53, TSC1, TSC2, VHL, WRN and WT1.   The report date is 03/27/2018.   04/21/2018 - 10/08/2018 Chemotherapy   The patient had palonosetron (ALOXI) injection 0.25 mg, 0.25 mg, Intravenous,  Once, 13 of 13 cycles Administration: 0.25 mg (04/21/2018), 0.25 mg (05/05/2018), 0.25 mg (05/19/2018), 0.25 mg  (06/02/2018), 0.25 mg (06/17/2018), 0.25 mg (06/30/2018), 0.25 mg (07/13/2018), 0.25 mg (08/11/2018), 0.25 mg (08/25/2018), 0.25 mg (09/08/2018), 0.25 mg (09/22/2018), 0.25 mg (10/06/2018) leucovorin 800 mg in dextrose 5 % 250 mL infusion, 876 mg, Intravenous,  Once, 13 of 13 cycles Administration: 800 mg (04/21/2018), 900 mg (05/05/2018), 800 mg (05/19/2018), 800 mg (06/02/2018), 900 mg (06/17/2018), 900 mg (06/30/2018), 900 mg (07/13/2018), 900 mg (08/11/2018), 900 mg (08/25/2018), 876 mg (09/22/2018), 900 mg (09/08/2018), 876 mg (10/06/2018) oxaliplatin (ELOXATIN) 185 mg in dextrose 5 % 500 mL chemo infusion, 85 mg/m2 = 185 mg, Intravenous,  Once, 13 of 13 cycles Dose modification: 68 mg/m2 (80 % of original dose 85 mg/m2, Cycle 12, Reason: Other (see comments), Comment: neuropathy), 42.5 mg/m2 (50 % of original dose 85 mg/m2, Cycle 13, Reason: Provider Judgment) Administration: 185 mg (04/21/2018), 185 mg (05/05/2018), 185 mg (05/19/2018), 185 mg (06/02/2018), 185 mg (06/17/2018), 185 mg (06/30/2018), 185 mg (07/13/2018), 185 mg (08/11/2018), 185 mg (08/25/2018), 185 mg (09/08/2018), 150 mg (09/22/2018), 95 mg (10/06/2018) fluorouracil (ADRUCIL) chemo injection 900 mg, 400 mg/m2 = 900 mg, Intravenous,  Once, 13 of 13 cycles Administration: 900 mg (04/21/2018), 900 mg (05/05/2018), 900 mg (05/19/2018), 900 mg (06/02/2018), 900 mg (06/17/2018), 900 mg (06/30/2018), 900 mg (07/13/2018), 900 mg (08/11/2018), 900 mg (08/25/2018), 900 mg (09/08/2018), 900 mg (09/22/2018), 900 mg (10/06/2018) fosaprepitant (EMEND) 150 mg, dexamethasone (DECADRON) 12 mg in sodium chloride 0.9 % 145 mL IVPB, , Intravenous,  Once, 10 of 10 cycles Administration:  (06/02/2018),  (06/17/2018),  (06/30/2018),  (07/13/2018),  (08/11/2018),  (08/25/2018),  (09/08/2018),  (09/22/2018),  (10/06/2018) fluorouracil (ADRUCIL) 5,250 mg in sodium chloride 0.9 % 145 mL chemo infusion, 2,400 mg/m2 = 5,250 mg, Intravenous, 1 Day/Dose, 13 of 13 cycles Administration: 5,250 mg (  04/21/2018), 5,250 mg (05/05/2018),  5,250 mg (05/19/2018), 5,250 mg (06/02/2018), 5,250 mg (06/17/2018), 5,250 mg (06/30/2018), 5,250 mg (07/13/2018), 5,250 mg (08/11/2018), 5,250 mg (08/25/2018), 5,250 mg (09/08/2018), 5,250 mg (09/22/2018), 5,250 mg (10/06/2018)  for chemotherapy treatment.    09/24/2019 -  Chemotherapy   The patient had capecitabine (XELODA) 500 MG tablet, 850 mg/m2 = 2,000 mg, Oral, 2 times daily after meals, 1 of 1 cycle, Start date: 09/17/2019, End date: -- palonosetron (ALOXI) injection 0.25 mg, 0.25 mg, Intravenous,  Once, 4 of 4 cycles Administration: 0.25 mg (09/24/2019), 0.25 mg (10/15/2019), 0.25 mg (11/22/2019), 0.25 mg (12/15/2019) oxaliplatin (ELOXATIN) 300 mg in dextrose 5 % 500 mL chemo infusion, 130 mg/m2 = 300 mg, Intravenous,  Once, 4 of 4 cycles Administration: 300 mg (09/24/2019), 300 mg (10/15/2019), 300 mg (11/22/2019), 300 mg (12/15/2019) fosaprepitant (EMEND) 150 mg in sodium chloride 0.9 % 145 mL IVPB, 150 mg, Intravenous,  Once, 2 of 2 cycles Administration: 150 mg (11/22/2019), 150 mg (12/15/2019)  for chemotherapy treatment.    Malignant neoplasm of descending colon ([place])  07/28/2019 Initial Diagnosis   Malignant neoplasm of descending colon ([place])   09/24/2019 -  Chemotherapy   The patient had capecitabine (XELODA) 500 MG tablet, 850 mg/m2 = 2,000 mg, Oral, 2 times daily after meals, 1 of 1 cycle, Start date: 09/17/2019, End date: -- palonosetron (ALOXI) injection 0.25 mg, 0.25 mg, Intravenous,  Once, 4 of 4 cycles Administration: 0.25 mg (09/24/2019), 0.25 mg (10/15/2019), 0.25 mg (11/22/2019), 0.25 mg (12/15/2019) oxaliplatin (ELOXATIN) 300 mg in dextrose 5 % 500 mL chemo infusion, 130 mg/m2 = 300 mg, Intravenous,  Once, 4 of 4 cycles Administration: 300 mg (09/24/2019), 300 mg (10/15/2019), 300 mg (11/22/2019), 300 mg (12/15/2019) fosaprepitant (EMEND) 150 mg in sodium chloride 0.9 % 145 mL IVPB, 150 mg, Intravenous,  Once, 2 of 2 cycles Administration: 150 mg (11/22/2019), 150 mg (12/15/2019)  for chemotherapy treatment.       CANCER STAGING: Cancer Staging No matching staging information was found for the patient.  INTERVAL HISTORY:  Mr. Joel Repass., a 44 y.o. male, returns for routine follow-up of his recurrent colon cancer. Joel Jefferson was last seen on 12/15/2019.  Today he is accompanied by his wife. He reports feeling well. He denies having any recent infections, F/C or new coughs. His chemo side effects have almost resolved. His appetite is improving.   REVIEW OF SYSTEMS:  Review of Systems  Constitutional: Positive for fatigue (75%). Negative for appetite change, chills and fever.  Respiratory: Negative for cough.   All other systems reviewed and are negative.   PAST MEDICAL/SURGICAL HISTORY:  Past Medical History:  Diagnosis Date  . Diabetes mellitus without complication ([place])   . Family history of breast cancer   . GERD (gastroesophageal reflux disease)   . History of kidney stones   . Hypercholesterolemia   . Hypertension   . Urethral stricture    Past Surgical History:  Procedure Laterality Date  . BALLOON DILATION  04/06/2012   Procedure: BALLOON DILATION;  Surgeon: Reece Packer, MD;  Location[facility];  Service: Urology;  Laterality: N/A;  . BIOPSY  02/24/2018   Procedure: BIOPSY;  Surgeon: Danie Binder, MD;  [facility];  Service: Endoscopy;;  colon  . BIOPSY  06/01/2019   Procedure: BIOPSY;  Surgeon: Danie Binder, MD;  [facility];  Service: Endoscopy;;  . BOWEL RESECTION N/A 07/28/2019   Procedure: LOW ANTERIOR BOWEL RESECTION;  Surgeon: Arnoldo Morale,  Elta Guadeloupe, MD;  Location: [facility];  Service: General;  Laterality: N/A;  . COLONOSCOPY WITH PROPOFOL N/A 02/24/2018   Dr. Oneida Alar: fungating, infiltrative partially obstructing mass in sigmoid colon, area tattooed. ext/int hemorrhoids. Bx: adenocarcinoma  . COLONOSCOPY WITH PROPOFOL N/A 06/01/2019   Procedure: COLONOSCOPY WITH PROPOFOL;  Surgeon: Danie Binder, MD;  [facility];  Service: Endoscopy;  Laterality: N/A;  9:30am  . CYSTOSCOPY WITH HOLMIUM LASER LITHOTRIPSY  08/07/2019   Procedure: CYSTOSCOPY WITH HOLMIUM LASER LITHOTRIPSY;  Surgeon: Festus Aloe, MD;  Location: [facility];  Service: Urology;;  . CYSTOSCOPY/RETROGRADE/URETEROSCOPY/STONE EXTRACTION WITH BASKET Left 08/07/2019   Procedure: CYSTOSCOPY/RETROGRADE/URETEROSCOPY/STONE EXTRACTION WITH BASKET;  Surgeon: Festus Aloe, MD;  Location: [facility];  Service: Urology;  Laterality: Left;  . ESOPHAGOGASTRODUODENOSCOPY (EGD) WITH PROPOFOL N/A 08/29/2015   Dr. Oneida Alar: possible proximal esophageal web s/p dilation, moderate gastritis, negative eosinophilic esophagitis   . ESOPHAGOGASTRODUODENOSCOPY (EGD) WITH PROPOFOL  06/01/2019   Procedure: ESOPHAGOGASTRODUODENOSCOPY (EGD) WITH PROPOFOL;  Surgeon: Danie Binder, MD;  [facility];  Service: Endoscopy;;  . PARTIAL COLECTOMY N/A 03/02/2018   Procedure: PARTIAL COLECTOMY;  Surgeon: Aviva Signs, MD;  Location: [facility];  Service: General;  Laterality: N/A;  . PORTACATH PLACEMENT Left 04/13/2018   Procedure: INSERTION PORT-A-CATH;  Surgeon: Aviva Signs, MD;  Location: [facility];  Service: General;  Laterality: Left;  . SAVORY DILATION N/A 08/29/2015   Procedure: SAVORY DILATION;  Surgeon: Danie Binder, MD;  [facility];  Service: Endoscopy;  Laterality: N/A;  . URETHRAL DILATION    . WISDOM TOOTH EXTRACTION      SOCIAL HISTORY:  Social History   Socioeconomic History  . Marital status: Divorced    Spouse name: Not on file  . Number of children: Not on file  . Years of education: Not on file  . Highest education level: Not on file  Occupational History  . Not on file  Tobacco Use  . Smoking status: Former Smoker    Packs/day: 0.25    Years: 9.00    Pack years: 2.25    Types: Cigarettes    Quit date: 07/27/2011    Years since quitting: 8.4  . Smokeless tobacco: Never Used  Vaping Use  . Vaping Use: Never used  Substance  and Sexual Activity  . Alcohol use: Yes    Alcohol/week: 6.0 standard drinks    Types: 6 Cans of beer per week  . Drug use: No  . Sexual activity: Yes    Birth control/protection: None  Other Topics Concern  . Not on file  Social History Narrative  . Not on file   Social Determinants of Health   Financial Resource Strain:   . Difficulty of Paying Living Expenses: Not on file  Food Insecurity:   . Worried About Charity fundraiser in the Last Year: Not on file  . Ran Out of Food in the Last Year: Not on file  Transportation Needs:   . Lack of Transportation (Medical): Not on file  . Lack of Transportation (Non-Medical): Not on file  Physical Activity:   . Days of Exercise per Week: Not on file  . Minutes of Exercise per Session: Not on file  Stress:   . Feeling of Stress : Not on file  Social Connections:   . Frequency of Communication with Friends and Family: Not on file  . Frequency of Social Gatherings with Friends and Family: Not on file  . Attends Religious Services: Not on  file  . Active Member of Clubs or Organizations: Not on file  . Attends Archivist Meetings: Not on file  . Marital Status: Not on file  Intimate Partner Violence:   . Fear of Current or Ex-Partner: Not on file  . Emotionally Abused: Not on file  . Physically Abused: Not on file  . Sexually Abused: Not on file    FAMILY HISTORY:  Family History  Problem Relation Age of Onset  . Dementia Mother   . Dementia Maternal Aunt   . Dementia Maternal Grandmother   . COPD Maternal Grandfather   . Breast cancer Maternal Aunt 42  . Breast cancer Maternal Aunt        dx in her 71s  . Lung cancer Maternal Aunt   . Healthy Son   . Colon cancer Neg Hx     CURRENT MEDICATIONS:  Current Outpatient Medications  Medication Sig Dispense Refill  . bismuth subsalicylate (PEPTO BISMOL) 262 MG/15ML suspension Take 30 mLs by mouth every 6 (six) hours as needed.    . capecitabine (XELODA) 500 MG  tablet Take 4 tablets (2,000 mg total) by mouth 2 (two) times daily after a meal. Take for 14 days, then hold for 7 days. Repeat every 21 days. (Patient taking differently: Take 850 mg/m2 by mouth 2 (two) times daily after a meal. Take for 14 days, then hold for 7 days. Repeat every 21 days.  Take 2 tabs in the morning and 3 tabs at night) 112 tablet 3  . CONTOUR NEXT TEST test strip USE TO CHECK BLOOD SUGAR BID    . famotidine (PEPCID) 40 MG tablet Take 40 mg by mouth daily.    Marland Kitchen glipiZIDE (GLUCOTROL XL) 5 MG 24 hr tablet Take two tablets daily until you see Dr. Hilma Favors (Patient taking differently: 10 mg in the morning and at bedtime. ) 30 tablet 3  . lidocaine-prilocaine (EMLA) cream Apply small amount to port site one hour prior to appointment. Cover with plastic wrap 30 g 3  . LORazepam (ATIVAN) 0.5 MG tablet Take 1 tablet (0.5 mg total) by mouth every 8 (eight) hours. 30 tablet 2  . losartan (COZAAR) 100 MG tablet Take 100 mg by mouth daily.    . magnesium oxide (MAG-OX) 400 MG tablet Take 400 mg by mouth daily.    . metFORMIN (GLUCOPHAGE) 500 MG tablet Take 500 mg by mouth 2 (two) times daily with a meal.    . Multiple Vitamin (MULTIVITAMIN) tablet Take 1 tablet by mouth daily.     Marland Kitchen OLANZapine (ZYPREXA) 10 MG tablet TAKE 1 TABLET BY MOUTH DAILY AT BEDTIME 30 tablet 0  . OXALIPLATIN IV Inject into the vein.    Marland Kitchen Potassium 99 MG TABS Take 99 mg by mouth daily.    . pravastatin (PRAVACHOL) 20 MG tablet Take 20 mg by mouth daily.   1  . topiramate (TOPAMAX) 100 MG tablet Take 100 mg by mouth daily.     . ondansetron (ZOFRAN) 8 MG tablet Take 1 tablet (8 mg total) by mouth every 8 (eight) hours as needed for nausea or vomiting. (Patient not taking: Reported on 01/13/2020) 45 tablet 2   No current facility-administered medications for this visit.    ALLERGIES:  No Known Allergies  PHYSICAL EXAM:  Performance status (ECOG): 1 - Symptomatic but completely ambulatory  Vitals:   01/13/20 1034   BP: (!) 134/92  Pulse: 94  Resp: 17  Temp: (!) 96.9 F (36.1 C)  SpO2:  99%   Wt Readings from Last 3 Encounters:  01/13/20 233 lb 11.2 oz (106 kg)  12/15/19 232 lb 9.6 oz (105.5 kg)  11/22/19 237 lb 8 oz (107.7 kg)   Physical Exam Vitals reviewed.  Constitutional:      Appearance: Normal appearance. He is obese.  Cardiovascular:     Rate and Rhythm: Normal rate and regular rhythm.     Pulses: Normal pulses.     Heart sounds: Normal heart sounds.  Pulmonary:     Effort: Pulmonary effort is normal.     Breath sounds: Normal breath sounds.  Abdominal:     Palpations: Abdomen is soft. There is no hepatomegaly, splenomegaly or mass.     Tenderness: There is no abdominal tenderness.     Hernia: No hernia is present.  Neurological:     General: No focal deficit present.     Mental Status: He is alert and oriented to person, place, and time.  Psychiatric:        Mood and Affect: Mood normal.        Behavior: Behavior normal.      LABORATORY DATA:  I have reviewed the labs as listed.  CBC Latest Ref Rng & Units 01/10/2020 12/15/2019 11/22/2019  WBC 4.0 - 10.5 K/uL 5.4 5.5 5.4  Hemoglobin 13.0 - 17.0 g/dL 15.5 14.2 13.8  Hematocrit 39 - 52 % 44.3 42.5 40.8  Platelets 150 - 400 K/uL 135(L) 165 185   CMP Latest Ref Rng & Units 01/10/2020 12/15/2019 11/22/2019  Glucose 70 - 99 mg/dL 164(H) 332(H) 299(H)  BUN 6 - 20 mg/dL '16 14 13  ' Creatinine 0.61 - 1.24 mg/dL 0.86 0.87 0.91  Sodium 135 - 145 mmol/L 137 139 137  Potassium 3.5 - 5.1 mmol/L 4.4 4.0 4.0  Chloride 98 - 111 mmol/L 104 106 108  CO2 22 - 32 mmol/L 21(L) 23 22  Calcium 8.9 - 10.3 mg/dL 9.4 9.1 8.6(L)  Total Protein 6.5 - 8.1 g/dL 7.1 6.5 6.7  Total Bilirubin 0.3 - 1.2 mg/dL 0.8 0.9 0.6  Alkaline Phos 38 - 126 U/L 45 53 43  AST 15 - 41 U/L 35 26 31  ALT 0 - 44 U/L 46(H) 42 58(H)   Lab Results  Component Value Date   CEA1 3.3 01/10/2020   CEA1 4.2 12/15/2019   CEA1 1.2 08/23/2019    DIAGNOSTIC IMAGING:  I have  independently reviewed the scans and discussed with the patient. CT CHEST ABDOMEN PELVIS W CONTRAST  Result Date: 01/10/2020 CLINICAL DATA:  Colorectal cancer EXAM: CT CHEST, ABDOMEN, AND PELVIS WITH CONTRAST TECHNIQUE: Multidetector CT imaging of the chest, abdomen and pelvis was performed following the standard protocol during bolus administration of intravenous contrast. CONTRAST:  121m OMNIPAQUE IOHEXOL 300 MG/ML  SOLN COMPARISON:  CT abdomen/pelvis dated 09/27/2019. PET-CT dated 07/05/2019. CT chest dated 02/26/2018. FINDINGS: CT CHEST FINDINGS Cardiovascular: The heart is normal in size. No pericardial effusion. No evidence of thoracic aortic aneurysm. Left chest port terminates in the upper SVC. Mild fibrin sheath (series 2/image 22). Mediastinum/Nodes: No suspicious mediastinal lymphadenopathy. Mild wall thickening involving the mid/lower esophagus (series 2/image 31), without focal mass, correlate for esophagitis. Visualized thyroid is unremarkable. Lungs/Pleura: 6 mm subpleural nodule in the anterior right lower lobe (series 3/image 31), unchanged from 2019, benign. 4 mm nodule at the left lung base (series 3/image 114), unchanged from 2019, benign. Faint clustered centrilobular ground-glass nodularity in the central right upper lobe (series 3/image 33), new, favoring mild infection/inflammation. No  focal consolidation. No pleural effusion or pneumothorax. Musculoskeletal: Mild degenerative changes of the lower thoracic spine. CT ABDOMEN PELVIS FINDINGS Hepatobiliary: Hepatic steatosis focal fatty sparing along the gallbladder fossa. No suspicious/enhancing hepatic lesions. Gallbladder is unremarkable. No intrahepatic or extrahepatic ductal dilatation. Pancreas: Within normal limits. Spleen: Within normal limits. Adrenals/Urinary Tract: Adrenal glands are within normal limits. Kidneys are within normal limits.  No hydronephrosis. Bladder is underdistended but unremarkable. Stomach/Bowel: Stomach is  within normal limits. No evidence of bowel obstruction. Normal appendix (series 2/image 102). Status post left hemicolectomy with suture line in the left lower pelvis (series 2/image 111). No colonic wall thickening or mass is evident on CT. Vascular/Lymphatic: No evidence of abdominal aortic aneurysm. No suspicious abdominopelvic lymphadenopathy. Reproductive: Prostate is unremarkable. Other: No abdominopelvic ascites. Musculoskeletal: Visualized osseous structures are within normal limits. IMPRESSION: Status post left hemicolectomy. No evidence of recurrent or metastatic disease. Stable small pulmonary nodules in the bilateral lower lobes, unchanged from 2019, benign. Faint centrilobular ground-glass nodularity in the central right upper lobe, new, favoring mild infection/inflammation. Electronically Signed   By: Julian Hy M.D.   On: 01/10/2020 13:20     ASSESSMENT:  1. Stage III (PT3PN2B) sigmoid colon adenocarcinoma, MSI stable, status post sigmoid colon resection on 03/02/2018, 14/35 lymph nodes positive. -12 cycles of FOLFOX from 04/21/2018 through 10/06/2018. -Repeat colonoscopy on 06/01/2019 showed recurrence at the anastomotic area, biopsy consistent with adenocarcinoma. -PET scan on 07/05/2019 shows increased uptake in the anastomotic area. There are 2 small 5 mm nodules in the right upper lobe and left upper lobe which are new. These are not hypermetabolic on PET scan given the small size. Patient also had COVID-19 infection earlier this year. -LAR on 07/28/2019, pathology showing recurrent moderately differentiated adenocarcinoma, metastatic carcinoma in 3/6 lymph nodes, margins negative. Grade 2. RPT2, RPN1B. -MMR is preserved. -3 months of CAPOX from 09/24/2019 through 12/15/2019. -CT CAP on 01/10/2020 shows no evidence of recurrence or metastatic disease.  Stable small lung nodules in bilateral lower lobes unchanged from 2019, benign.   PLAN:  1. Recurrent sigmoid colon  adenocarcinoma: -He has completed adjuvant chemotherapy. -His energy level is improving.  Reviewed labs.  Platelet count is mildly low at 135. -CEA is 3.3.  Reviewed CT CAP results which did not show any evidence of recurrence. -As he had local recurrence pretty fast, I have recommended follow-up colonoscopy in 1 year from the last. -RTC in 3 months with labs and CEA.  2. Peripheral neuropathy: -He does not have any numbness or tingling at this time.  Cold sensitivity improved.  3. Elevated LFTs: -Mildly elevated ALT stable.  We will closely monitor.    Orders placed this encounter:  No orders of the defined types were placed in this encounter.    Derek Jack, MD [place] [phone]   I, Milinda Antis, am acting as a scribe for Dr. Sanda Linger.  I, Derek Jack MD, have reviewed the above documentation for accuracy and completeness, and I agree with the above.

## 2020-01-13 NOTE — Telephone Encounter (Signed)
Patient called and wants to know the earliest he can be put on feb procedure schedule   Has a history of cancer and he does not want to be scheduled after February.  Insistent that it needs to be done exactly one year from the last.

## 2020-01-13 NOTE — Patient Instructions (Signed)
[facility] at [facility] Discharge Instructions  You were seen today by Dr. Katragadda. He went over your recent results and scans. Dr. Katragadda will see you back in 3 months for labs and follow up.   Thank you for choosing [facility] at [facility] to provide your oncology and hematology care.  To afford each patient quality time with our provider, please arrive at least 15 minutes before your scheduled appointment time.   If you have a lab appointment with the Cancer Center please come in thru the Main Entrance and check in at the main information desk  You need to re-schedule your appointment should you arrive 10 or more minutes late.  We strive to give you quality time with our providers, and arriving late affects you and other patients whose appointments are after yours.  Also, if you no show three or more times for appointments you may be dismissed from the clinic at the providers discretion.     Again, thank you for choosing [facility].  Our hope is that these requests will decrease the amount of time that you wait before being seen by our physicians.       _____________________________________________________________  Should you have questions after your visit to [facility], please contact our office at [phone] between the hours of 8:00 a.m. and 4:30 p.m.  Voicemails left after 4:00 p.m. will not be returned until the following business day.  For prescription refill requests, have your pharmacy contact our office and allow 72 hours.    Cancer Center Support Programs:   > Cancer Support Group  2nd Tuesday of the month 1pm-2pm, Journey Room    

## 2020-01-22 IMAGING — CT CT CHEST W/ CM
2 of 4 series · 15 of 36 positions shown, 18 images · IV contrast (omnipaque)
Comparison: No priors.

CLINICAL DATA: 42-year-old male with history of recently diagnosed
colon cancer of the sigmoid colon status post surgical resection on
03/02/2018. No current chest complaints.

EXAM:
CT CHEST WITH CONTRAST
TECHNIQUE: Multidetector CT imaging of the chest was performed during
intravenous contrast administration.
CONTRAST:  75mL OMNIPAQUE IOHEXOL 300 MG/ML  SOLN

[Series 2: axial st · axial · 0.70mm/px · z∈[+1263,+1537]mm · 12 of 151 slices shown, 15 images]
[im 7/151  mediastinal]
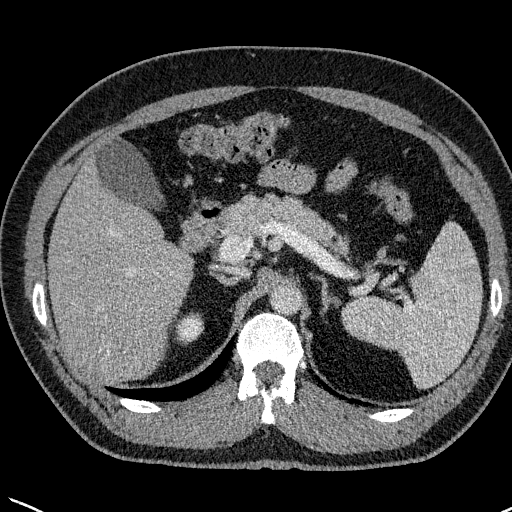
[im 7/151  lung]
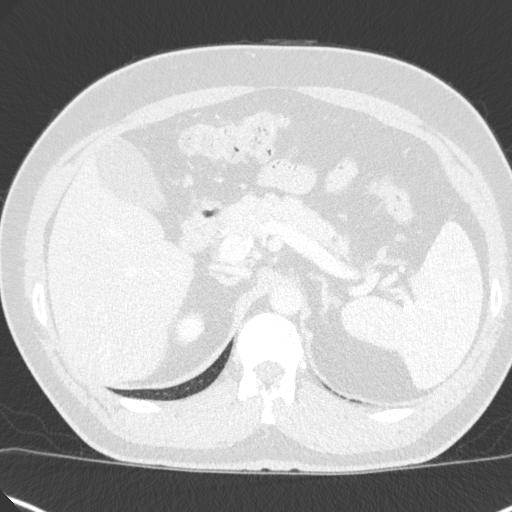
[im 19/151  lung]
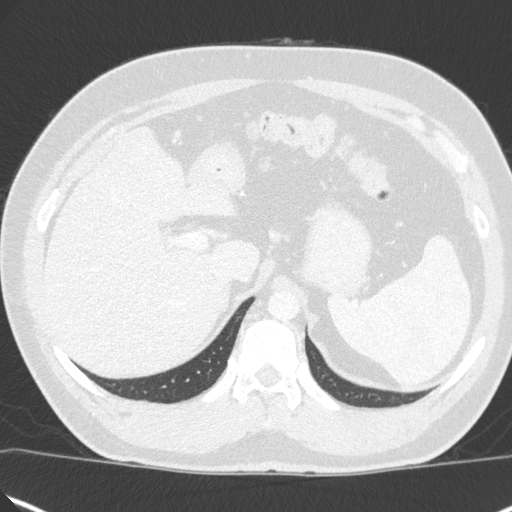
[im 32/151  lung]
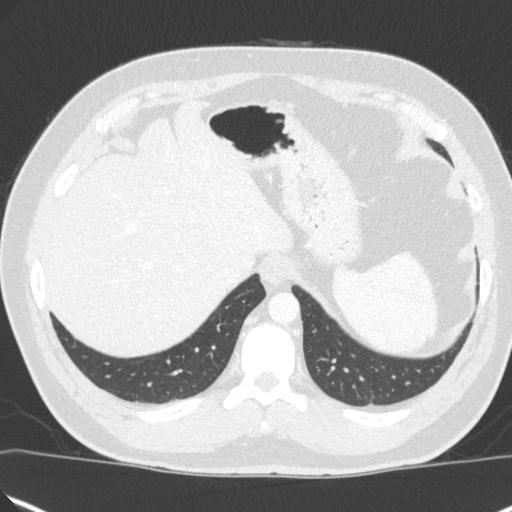
[im 44/151  lung]
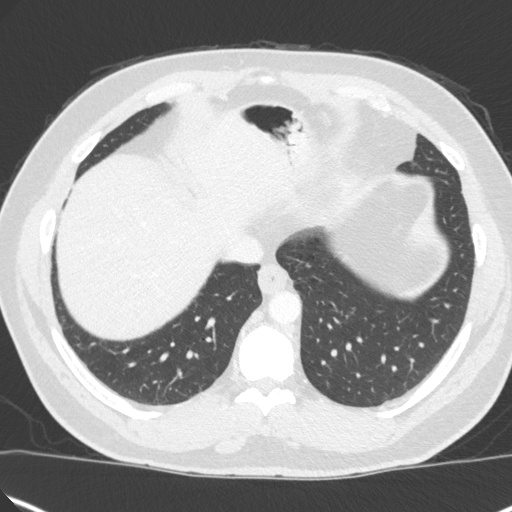
[im 57/151  mediastinal]
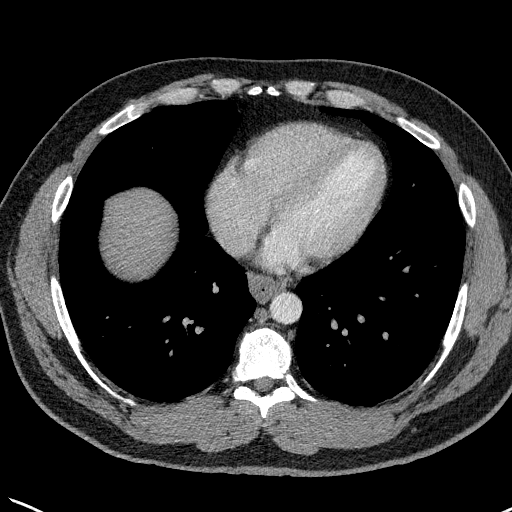
[im 57/151  lung]
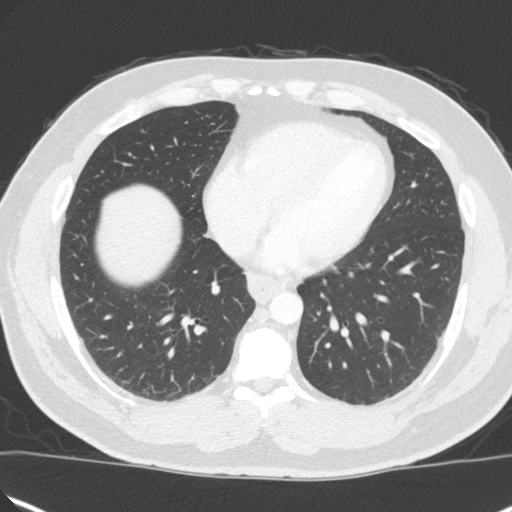
[im 69/151  lung]
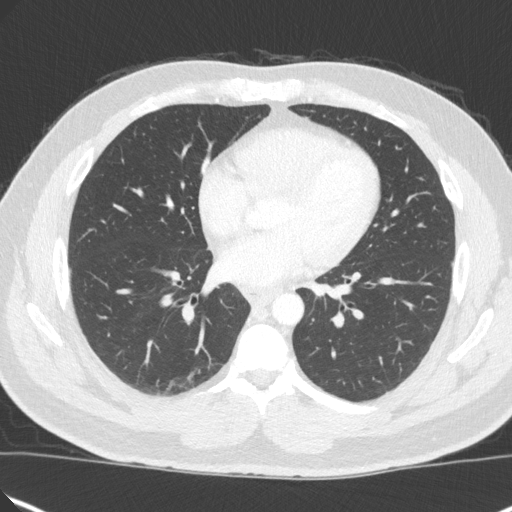
[im 82/151  lung]
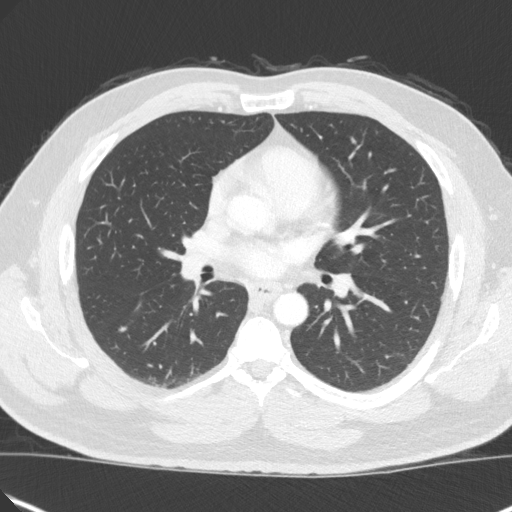
[im 94/151  lung]
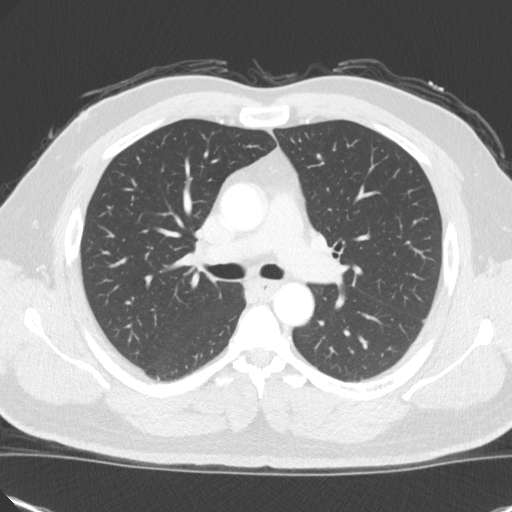
[im 107/151  mediastinal]
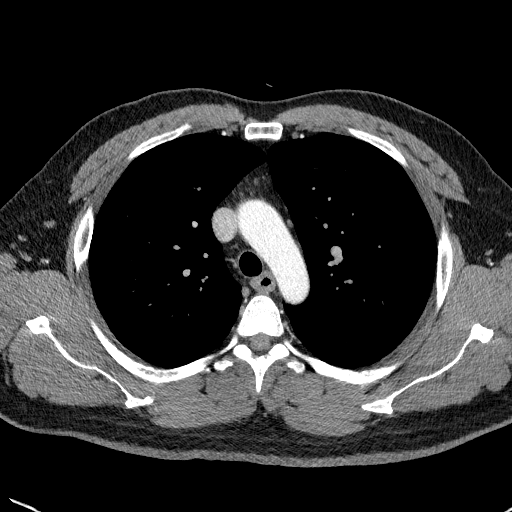
[im 107/151  lung]
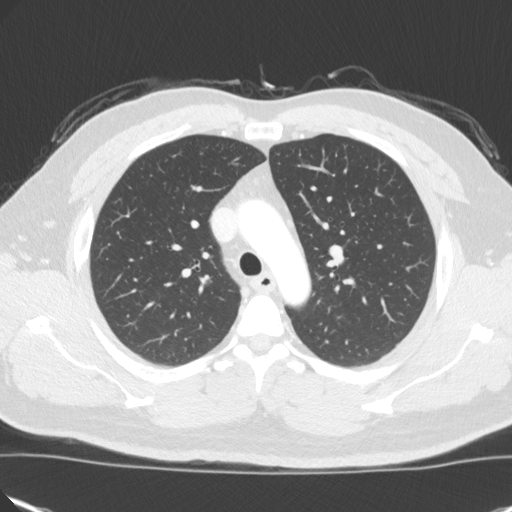
[im 119/151  lung]
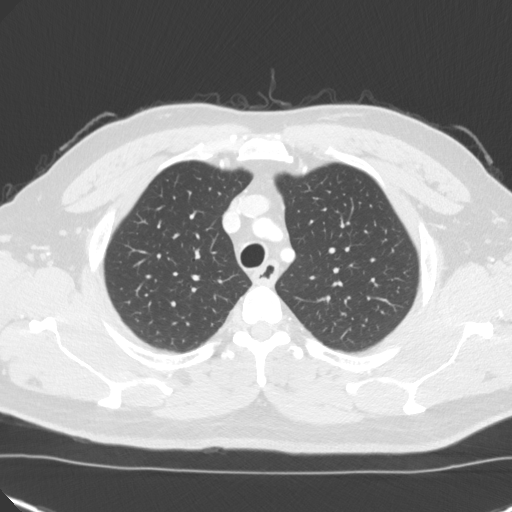
[im 132/151  lung]
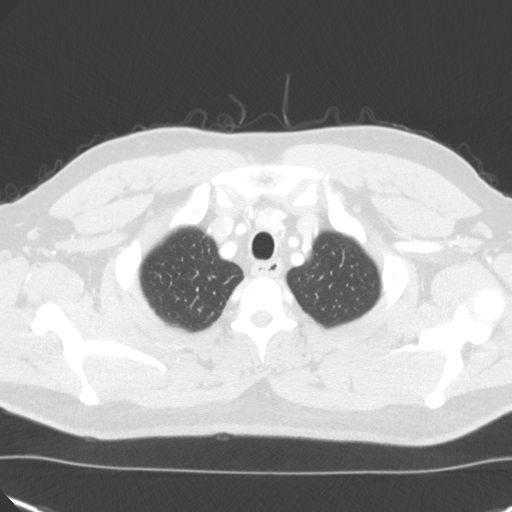
[im 144/151  lung]
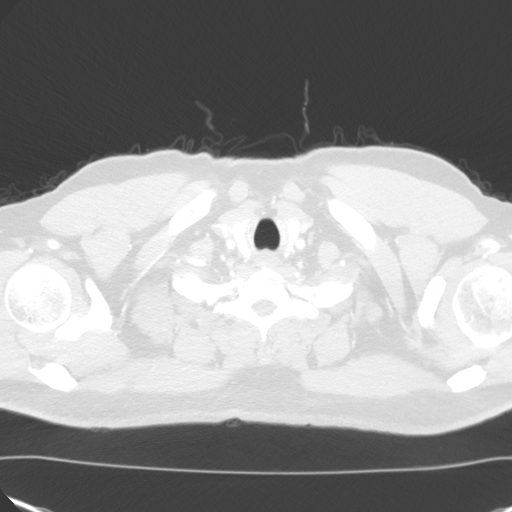

[Series 6: coronal · coronal · 0.64mm/px · 3 of 136 slices shown]
[im 28/136  lung]
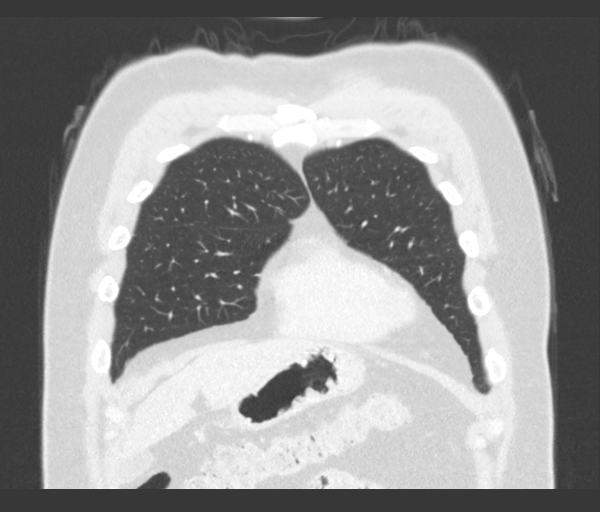
[im 55/136  lung]
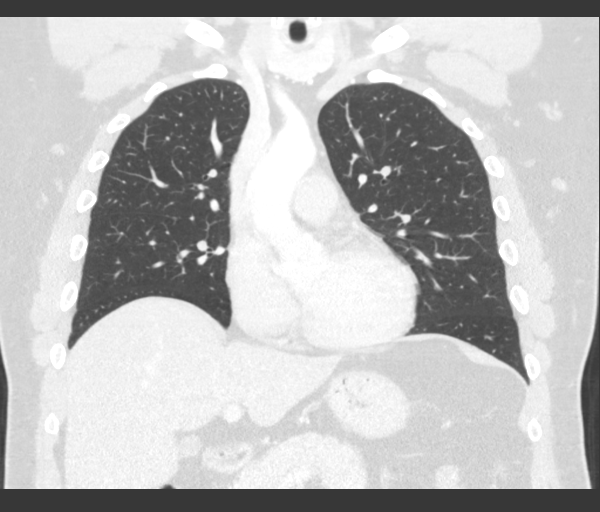
[im 82/136  lung]
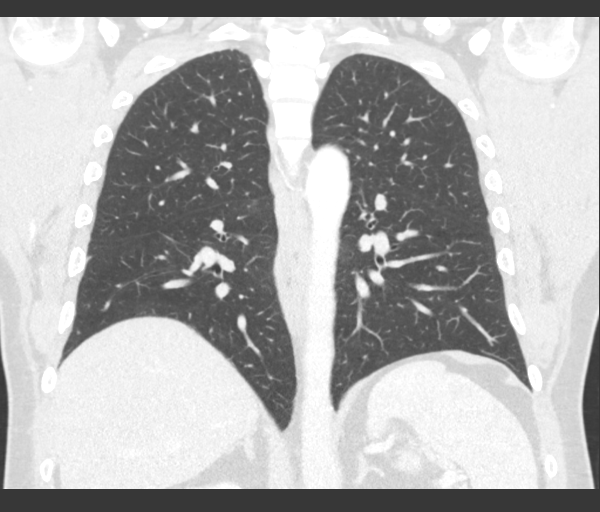

[15 of 36 positions shown; findings below may reference images not displayed]

FINDINGS: Cardiovascular: Heart size is normal. There is no significant
pericardial fluid, thickening or pericardial calcification. No
atherosclerotic calcifications are noted in the thoracic aorta or
the coronary arteries.

Mediastinum/Nodes: No pathologically enlarged mediastinal or hilar
lymph nodes. Esophagus is unremarkable in appearance. No axillary
lymphadenopathy.

Lungs/Pleura: 7 x 5 mm (mean diameter of 6 mm) subpleural nodule
associated with the right major fissure in the anterior aspect of
the superior segment of the right lower lobe (axial image 69 of
series 5) which appears relatively high attenuation (99 HU), favored
to represent a partially calcified subpleural lymph node. 4 mm left
lower lobe nodule (axial image 116 of series 5). No other larger
more suspicious appearing pulmonary nodules or masses are noted. No
acute consolidative airspace disease. No pleural effusions.

Upper Abdomen: Mild diffuse low attenuation throughout the
visualized portions of the hepatic parenchyma, suggestive of hepatic
steatosis.

Musculoskeletal: There are no aggressive appearing lytic or blastic
lesions noted in the visualized portions of the skeleton.
IMPRESSION: 1. No definite findings to strongly suggest metastatic disease to
the thorax. There are 2 small pulmonary nodules in the lungs
bilaterally, nonspecific, but favored to be benign. Attention at
time of routine follow-up surveillance scans is recommended to
ensure the stability of these findings.
2. Mild hepatic steatosis.

## 2020-01-28 IMAGING — DX DG ABDOMEN 1V
1 series · 1 of 1 positions shown · non-contrast
Comparison: CT 01/03/2017

CLINICAL DATA: Left flank pain

EXAM:
ABDOMEN - 1 VIEW

[abdomen kub]
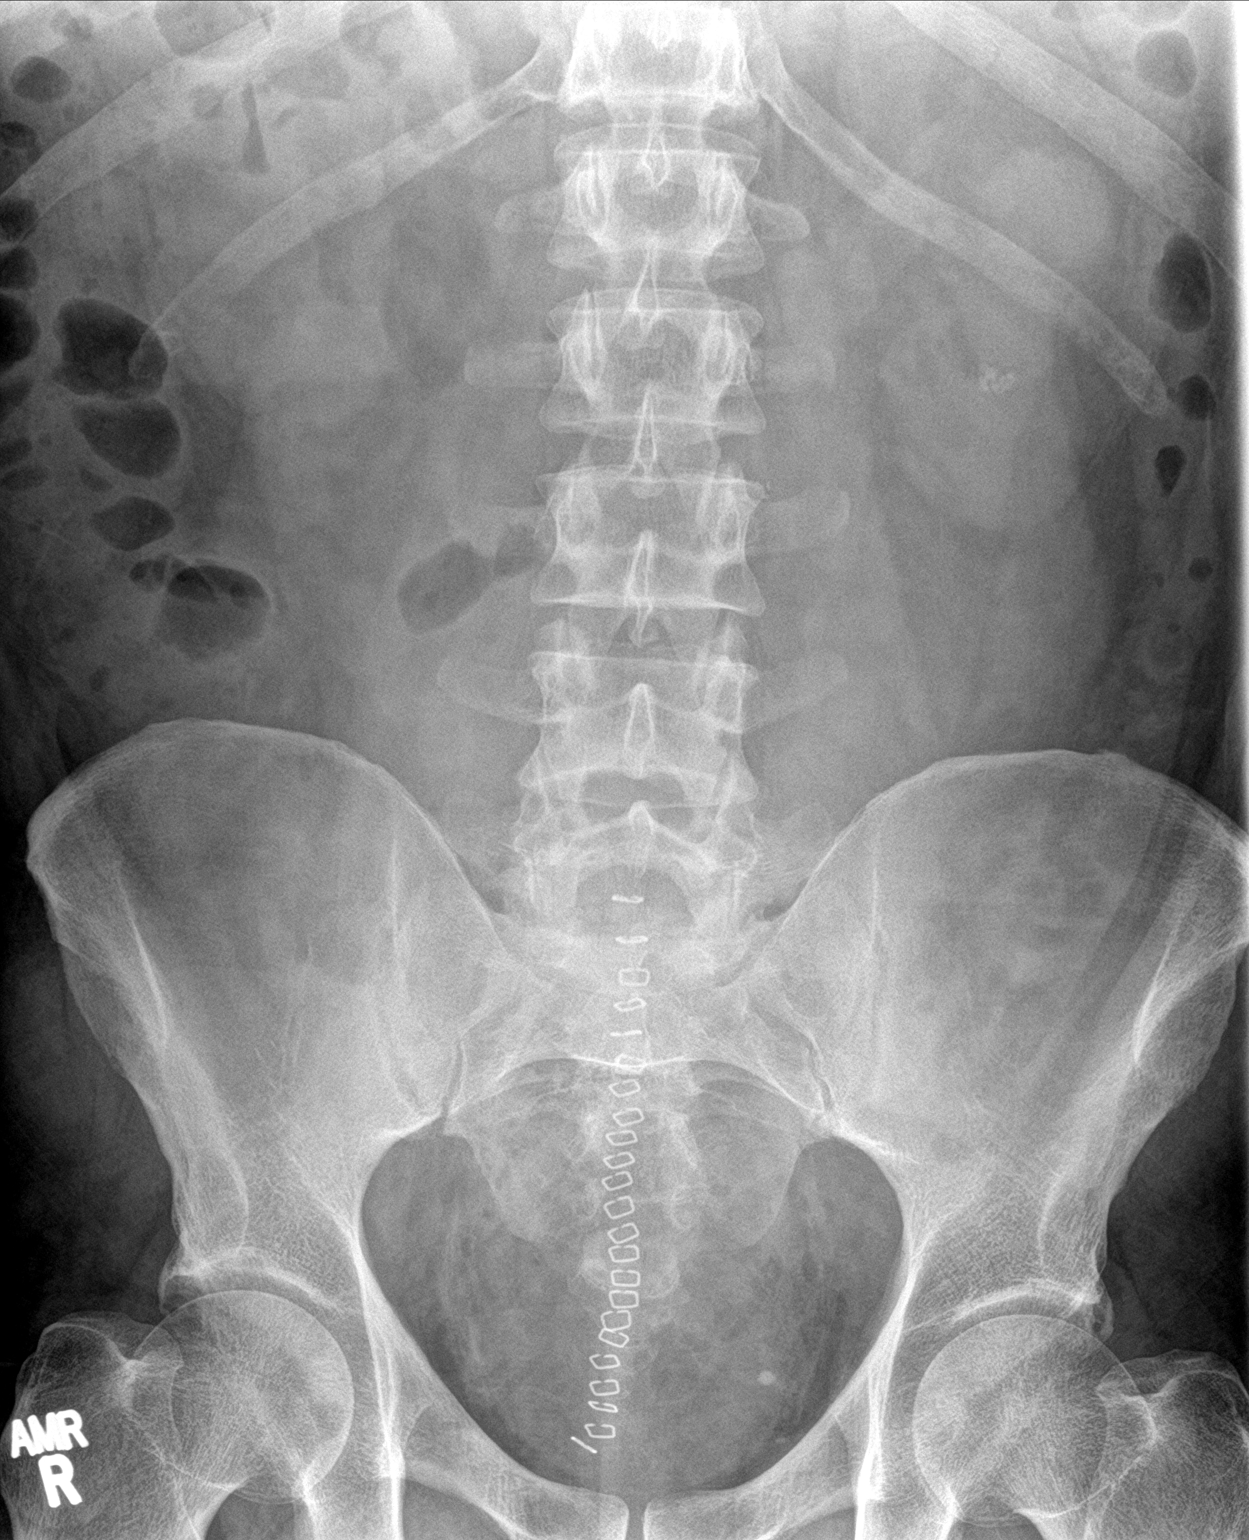

[1 of 1 positions shown; findings below may reference images not displayed]

FINDINGS: 10 mm calcification in the lower pole of the left kidney.
Calcification in the left lower pelvis could reflect distal left
ureteral stone or phlebolith. This measures 4 mm. Nonobstructive
bowel gas pattern. No organomegaly or free air. Skin staples over
the pelvis.
IMPRESSION: Left lower pole nephrolithiasis.

4 mm left pelvic calcification. This could reflect a distal left
ureteral stone or calcified phlebolith. If further evaluation is
felt warranted, CT urogram may be helpful.

## 2020-03-16 ENCOUNTER — Other Ambulatory Visit: Payer: Self-pay

## 2020-03-16 ENCOUNTER — Telehealth (INDEPENDENT_AMBULATORY_CARE_PROVIDER_SITE_OTHER): Payer: No Typology Code available for payment source | Admitting: Gastroenterology

## 2020-03-16 ENCOUNTER — Encounter: Payer: Self-pay | Admitting: Gastroenterology

## 2020-03-16 ENCOUNTER — Telehealth: Payer: Self-pay | Admitting: *Deleted

## 2020-03-16 ENCOUNTER — Telehealth: Payer: Self-pay

## 2020-03-16 DIAGNOSIS — K219 Gastro-esophageal reflux disease without esophagitis: Secondary | ICD-10-CM

## 2020-03-16 DIAGNOSIS — Z9189 Other specified personal risk factors, not elsewhere classified: Secondary | ICD-10-CM | POA: Diagnosis not present

## 2020-03-16 MED ORDER — SUPREP BOWEL PREP KIT 17.5-3.13-1.6 GM/177ML PO SOLN: 1.0000 | ORAL | 0 refills | Status: DC

## 2020-03-16 NOTE — Telephone Encounter (Signed)
Spoke to pt, TCS w/Dr. Abbey Chatters scheduled for 05/12/20 at [place] test 05/11/20 at 8:30am. He requested Suprep-rx sent to pharmacy. Orders entered. Appt letter and procedure instructions mailed.

## 2020-03-16 NOTE — Telephone Encounter (Signed)
Tried to call pt, no answer, LMOVM for return call.  

## 2020-03-16 NOTE — Patient Instructions (Signed)
We are arranging a colonoscopy with Dr. Abbey Chatters in February 2022!  Do not take metformin the day of the colonoscopy.  I recommend taking Benefiber 2 teaspoons each morning mixed in your beverage of choice.   Please call if this is not helpful  Have a wonderful Christmas!   I enjoyed talking with you again today! As you know, I value our relationship and want to provide genuine, compassionate, and quality care. I welcome your feedback. If you receive a survey regarding your visit,  I greatly appreciate you taking time to fill this out. See you next time!  Annitta Needs, PhD, ANP-BC [facility]

## 2020-03-16 NOTE — Telephone Encounter (Signed)
[name], you are scheduled for a virtual visit with your provider today.  Just as we do with appointments in the office, we must obtain your consent to participate.  Your consent will be active for this visit and any virtual visit you may have with one of our providers in the next 365 days.  If you have a MyChart account, I can also send a copy of this consent to you electronically.  All virtual visits are billed to your insurance company just like a traditional visit in the office.  As this is a virtual visit, video technology does not allow for your provider to perform a traditional examination.  This may limit your provider's ability to fully assess your condition.  If your provider identifies any concerns that need to be evaluated in person or the need to arrange testing such as labs, EKG, etc, we will make arrangements to do so.  Although advances in technology are sophisticated, we cannot ensure that it will always work on either your end or our end.  If the connection with a video visit is poor, we may have to switch to a telephone visit.  With either a video or telephone visit, we are not always able to ensure that we have a secure connection.   I need to obtain your verbal consent now.   Are you willing to proceed with your visit today?

## 2020-03-16 NOTE — Telephone Encounter (Signed)
Manuela Schwartz advised [facility] will be covering TCS and pt gave her AUTH# [id number]. Manuela Schwartz hasn't received faxed Auth from [place].

## 2020-03-16 NOTE — Telephone Encounter (Signed)
Pt consented to a virtual visit. 

## 2020-03-16 NOTE — Telephone Encounter (Signed)
ASA 2. No metformin day of procedure.  

## 2020-03-16 NOTE — Progress Notes (Signed)
Primary Care Physician:  Sharilyn Sites, MD  Primary GI: Dr. Abbey Chatters  Patient Location: Home   Provider Location: [facility] office   Reason for Visit: Arrange colonoscopy   Persons present on the virtual encounter, with roles: Patient and NP   Total time (minutes) spent on medical discussion: 10 minutes   Due to COVID-19, visit was conducted using virtual method.  Visit was requested by patient.  Virtual Visit via telephone Note Due to COVID-19, visit is conducted virtually and was requested by patient.   I connected with Joel Jefferson. on 03/16/20 at  9:00 AM EST by telephone and verified that I am speaking with the correct person using two identifiers.   I discussed the limitations, risks, security and privacy concerns of performing an evaluation and management service by telephone and the availability of in person appointments. I also discussed with the patient that there may be a patient responsible charge related to this service. The patient expressed understanding and agreed to proceed.  Chief Complaint  Patient presents with  . Colonoscopy    Finished chemo in September. Has ongoing pain from incisions.      History of Present Illness: Pleasant 44 year old male with history of stage 3 sigmoid colon cancer in Nov 2019, s/p sigmoid colon resection at that time and undergoing chemo. Recurrent colon cancer at the anastomosis in Feb 2021, undergoing LAR by Dr. Arnoldo Morale in April 2021 and completing chemo June through Sept 2021. Here to arrange high risk surveillance for Feb 2022.   No rectal bleeding. Mild constipation. Not taking anything for it. Occasional straining. BM usually every day. Pain at incision sites where his belt rests. No N/V. Appetite is good. GERD controlled on PPI, which is prescribed by the [place]. He is unsure the name of this. Pepcid without help. PPI has been helpful. Started back to work on 11/1 and feels tired. 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          Past Medical History:  Diagnosis Date  . Diabetes mellitus without complication ([place])   . Family history of breast cancer   . GERD (gastroesophageal reflux disease)   . History of kidney stones   . Hypercholesterolemia   . Hypertension   . Urethral stricture      Past Surgical History:  Procedure Laterality Date  . BALLOON DILATION  04/06/2012   Procedure: BALLOON DILATION;  Surgeon: Reece Packer, MD;  [facility];  Service: Urology;  Laterality: N/A;  . BIOPSY  02/24/2018   Procedure: BIOPSY;  Surgeon: Danie Binder, MD;  [facility];  Service: Endoscopy;;  colon  . BIOPSY  06/01/2019   Procedure: BIOPSY;  Surgeon: Danie Binder, MD;  [facility];  Service: Endoscopy;;  . BOWEL RESECTION N/A 07/28/2019   Procedure: LOW ANTERIOR BOWEL RESECTION;  Surgeon: Aviva Signs, MD;  Location: [facility];  Service: General;  Laterality: N/A;  . COLONOSCOPY WITH PROPOFOL N/A 02/24/2018   Dr. Oneida Alar: fungating, infiltrative partially obstructing mass in sigmoid colon, area tattooed. ext/int hemorrhoids. Bx: adenocarcinoma  . COLONOSCOPY WITH PROPOFOL N/A 06/01/2019   Polypoid lesion at surgical anastomosis, s/p biopsy. Consistent with adenocarcinoma.   . CYSTOSCOPY WITH HOLMIUM LASER LITHOTRIPSY  08/07/2019   Procedure: CYSTOSCOPY WITH HOLMIUM LASER LITHOTRIPSY;  Surgeon: Festus Aloe, MD;  Location: [facility];  Service: Urology;;  . CYSTOSCOPY/RETROGRADE/URETEROSCOPY/STONE EXTRACTION WITH BASKET Left 08/07/2019   Procedure: CYSTOSCOPY/RETROGRADE/URETEROSCOPY/STONE EXTRACTION WITH BASKET;  Surgeon: Junious Silk,  Rodman Key, MD;  Location: [facility];  Service: Urology;  Laterality: Left;  . ESOPHAGOGASTRODUODENOSCOPY (EGD) WITH PROPOFOL N/A 08/29/2015   Dr. Oneida Alar: possible proximal esophageal web s/p dilation, moderate gastritis, negative eosinophilic esophagitis   . ESOPHAGOGASTRODUODENOSCOPY (EGD) WITH PROPOFOL  06/01/2019   normal  esophagus ,normal stomach, normal duodenum.   Marland Kitchen PARTIAL COLECTOMY N/A 03/02/2018   Procedure: PARTIAL COLECTOMY;  Surgeon: Aviva Signs, MD;  Location: [facility];  Service: General;  Laterality: N/A;  . PORTACATH PLACEMENT Left 04/13/2018   Procedure: INSERTION PORT-A-CATH;  Surgeon: Aviva Signs, MD;  Location: [facility];  Service: General;  Laterality: Left;  . SAVORY DILATION N/A 08/29/2015   Procedure: SAVORY DILATION;  Surgeon: Danie Binder, MD;  [facility];  Service: Endoscopy;  Laterality: N/A;  . URETHRAL DILATION    . WISDOM TOOTH EXTRACTION       Current Meds  Medication Sig  . bismuth subsalicylate (PEPTO BISMOL) 262 MG/15ML suspension Take 30 mLs by mouth every 6 (six) hours as needed.  . CONTOUR NEXT TEST test strip USE TO CHECK BLOOD SUGAR BID  . glipiZIDE (GLUCOTROL XL) 5 MG 24 hr tablet Take two tablets daily until you see Dr. Hilma Favors (Patient taking differently: 10 mg in the morning and at bedtime.)  . LORazepam (ATIVAN) 0.5 MG tablet Take 1 tablet (0.5 mg total) by mouth every 8 (eight) hours.  Marland Kitchen losartan (COZAAR) 100 MG tablet Take 100 mg by mouth daily.  . magnesium oxide (MAG-OX) 400 MG tablet Take 400 mg by mouth daily.  . metFORMIN (GLUCOPHAGE) 500 MG tablet Take 500 mg by mouth 2 (two) times daily with a meal.  . Multiple Vitamin (MULTIVITAMIN) tablet Take 1 tablet by mouth daily.   Marland Kitchen OLANZapine (ZYPREXA) 10 MG tablet TAKE 1 TABLET BY MOUTH DAILY AT BEDTIME  . Potassium 99 MG TABS Take 99 mg by mouth daily.  . pravastatin (PRAVACHOL) 20 MG tablet Take 20 mg by mouth daily.   Marland Kitchen topiramate (TOPAMAX) 100 MG tablet Take 100 mg by mouth daily.      Family History  Problem Relation Age of Onset  . Dementia Mother   . Dementia Maternal Aunt   . Dementia Maternal Grandmother   . COPD Maternal Grandfather   . Breast cancer Maternal Aunt 42  . Breast cancer Maternal Aunt        dx in her 59s  . Lung cancer Maternal Aunt   . Healthy Son   . Colon cancer Neg  Hx     Social History   Socioeconomic History  . Marital status: Divorced    Spouse name: Not on file  . Number of children: Not on file  . Years of education: Not on file  . Highest education level: Not on file  Occupational History  . Not on file  Tobacco Use  . Smoking status: Former Smoker    Packs/day: 0.25    Years: 9.00    Pack years: 2.25    Types: Cigarettes    Quit date: 07/27/2011    Years since quitting: 8.6  . Smokeless tobacco: Never Used  Vaping Use  . Vaping Use: Never used  Substance and Sexual Activity  . Alcohol use: Yes    Alcohol/week: 6.0 standard drinks    Types: 6 Cans of beer per week  . Drug use: No  . Sexual activity: Yes    Birth control/protection: None  Other Topics Concern  . Not on file  Social History Narrative  .  Not on file   Social Determinants of Health   Financial Resource Strain: Not on file  Food Insecurity: Not on file  Transportation Needs: Not on file  Physical Activity: Not on file  Stress: Not on file  Social Connections: Not on file       Review of Systems: Gen: Denies fever, chills, anorexia. Denies fatigue, weakness, weight loss.  CV: Denies chest pain, palpitations, syncope, peripheral edema, and claudication. Resp: Denies dyspnea at rest, cough, wheezing, coughing up blood, and pleurisy. GI: see HPI Derm: Denies rash, itching, dry skin Psych: Denies depression, anxiety, memory loss, confusion. No homicidal or suicidal ideation.  Heme: Denies bruising, bleeding, and enlarged lymph nodes.  Observations/Objective: No distress. Unable to perform physical exam due to telephone encounter. No video available due to technical difficulties.   Assessment and Plan: 44 year old male initially diagnosed with stage 3 sigmoid colon cancer in Nov 2019, s/p sigmoid colon resection at that time and undergoing chemo. Recurrent colon cancer at the anastomosis in Feb 2021, undergoing LAR by Dr. Arnoldo Morale in April 2021 and  completing chemo June through Sept 2021. Here to arrange high risk surveillance for Feb 2022.   Mild constipation noted, and I have recommended Benefiber daily. Does not seem to need prescriptive agent, but he will call if no improvement with Benefiber. No alarm signs/symptoms. Continues following with Oncology, with most recent scans without recurrent disease.   Will pursue colonoscopy with Dr. Abbey Chatters in February 2022. Risks and benefits discussed in detail with patient with stated understanding. Hold metformin day of procedure.   Follow Up Instructions:    I discussed the assessment and treatment plan with the patient. The patient was provided an opportunity to ask questions and all were answered. The patient agreed with the plan and demonstrated an understanding of the instructions.   The patient was advised to call back or seek an in-person evaluation if the symptoms worsen or if the condition fails to improve as anticipated.  I provided 10  minutes of non-face-to-face time during this encounter.  Annitta Needs, PhD, ANP-BC [facility]

## 2020-03-16 NOTE — Telephone Encounter (Signed)
Vicente Males, please advise ASA for TCS.

## 2020-03-17 NOTE — Progress Notes (Signed)
Cc'ed to pcp °

## 2020-04-12 ENCOUNTER — Other Ambulatory Visit (HOSPITAL_COMMUNITY): Payer: No Typology Code available for payment source

## 2020-04-14 ENCOUNTER — Other Ambulatory Visit: Payer: Self-pay

## 2020-04-14 ENCOUNTER — Inpatient Hospital Stay (HOSPITAL_COMMUNITY): Payer: No Typology Code available for payment source | Attending: Hematology

## 2020-04-14 DIAGNOSIS — R7989 Other specified abnormal findings of blood chemistry: Secondary | ICD-10-CM | POA: Insufficient documentation

## 2020-04-14 DIAGNOSIS — G629 Polyneuropathy, unspecified: Secondary | ICD-10-CM | POA: Diagnosis not present

## 2020-04-14 DIAGNOSIS — C187 Malignant neoplasm of sigmoid colon: Secondary | ICD-10-CM

## 2020-04-14 DIAGNOSIS — Z85038 Personal history of other malignant neoplasm of large intestine: Secondary | ICD-10-CM | POA: Insufficient documentation

## 2020-04-14 DIAGNOSIS — Z452 Encounter for adjustment and management of vascular access device: Secondary | ICD-10-CM | POA: Diagnosis not present

## 2020-04-14 LAB — CBC WITH DIFFERENTIAL/PLATELET
Abs Immature Granulocytes: 0.02 10*3/uL (ref 0.00–0.07)
Basophils Absolute: 0 10*3/uL (ref 0.0–0.1)
Basophils Relative: 1 %
Eosinophils Absolute: 0.2 10*3/uL (ref 0.0–0.5)
Eosinophils Relative: 4 %
HCT: 44.3 % (ref 39.0–52.0)
Hemoglobin: 14.7 g/dL (ref 13.0–17.0)
Immature Granulocytes: 0 %
Lymphocytes Relative: 30 %
Lymphs Abs: 1.7 10*3/uL (ref 0.7–4.0)
MCH: 28.8 pg (ref 26.0–34.0)
MCHC: 33.2 g/dL (ref 30.0–36.0)
MCV: 86.7 fL (ref 80.0–100.0)
Monocytes Absolute: 0.3 10*3/uL (ref 0.1–1.0)
Monocytes Relative: 6 %
Neutro Abs: 3.3 10*3/uL (ref 1.7–7.7)
Neutrophils Relative %: 59 %
Platelets: 166 10*3/uL (ref 150–400)
RBC: 5.11 MIL/uL (ref 4.22–5.81)
RDW: 13.1 % (ref 11.5–15.5)
WBC: 5.6 10*3/uL (ref 4.0–10.5)
nRBC: 0 % (ref 0.0–0.2)

## 2020-04-14 LAB — COMPREHENSIVE METABOLIC PANEL
ALT: 53 U/L — ABNORMAL HIGH (ref 0–44)
AST: 30 U/L (ref 15–41)
Albumin: 4.2 g/dL (ref 3.5–5.0)
Alkaline Phosphatase: 41 U/L (ref 38–126)
Anion gap: 8 (ref 5–15)
BUN: 18 mg/dL (ref 6–20)
CO2: 22 mmol/L (ref 22–32)
Calcium: 8.6 mg/dL — ABNORMAL LOW (ref 8.9–10.3)
Chloride: 107 mmol/L (ref 98–111)
Creatinine, Ser: 0.79 mg/dL (ref 0.61–1.24)
GFR, Estimated: >60 mL/min (ref >60)
Glucose, Bld: 132 mg/dL — ABNORMAL HIGH (ref 70–99)
Potassium: 3.7 mmol/L (ref 3.5–5.1)
Sodium: 137 mmol/L (ref 135–145)
Total Bilirubin: 0.8 mg/dL (ref 0.3–1.2)
Total Protein: 6.8 g/dL (ref 6.5–8.1)

## 2020-04-14 MED ORDER — SODIUM CHLORIDE 0.9% FLUSH
10.0000 mL | Freq: Once | INTRAVENOUS | Status: AC
  Administered 2020-04-14: 10 mL via INTRAVENOUS

## 2020-04-14 MED ORDER — HEPARIN SOD (PORK) LOCK FLUSH 100 UNIT/ML IV SOLN
500.0000 [IU] | Freq: Once | INTRAVENOUS | Status: AC
  Administered 2020-04-14: 500 [IU] via INTRAVENOUS

## 2020-04-14 NOTE — Progress Notes (Signed)
Patients port flushed without difficulty.  Good blood return noted with no bruising or swelling noted at site.  Band aid applied.  VSS with discharge and left in satisfactory condition with no s/s of distress noted.   

## 2020-04-15 LAB — CEA: CEA: 2 ng/mL (ref 0.0–4.7)

## 2020-04-19 ENCOUNTER — Inpatient Hospital Stay (HOSPITAL_BASED_OUTPATIENT_CLINIC_OR_DEPARTMENT_OTHER): Payer: No Typology Code available for payment source | Admitting: Hematology

## 2020-04-19 ENCOUNTER — Other Ambulatory Visit: Payer: Self-pay

## 2020-04-19 VITALS — BP 123/90 | HR 93 | Temp 97.2°F | Resp 18 | Wt 237.4 lb

## 2020-04-19 DIAGNOSIS — C187 Malignant neoplasm of sigmoid colon: Secondary | ICD-10-CM

## 2020-04-19 DIAGNOSIS — Z85038 Personal history of other malignant neoplasm of large intestine: Secondary | ICD-10-CM | POA: Diagnosis not present

## 2020-04-19 NOTE — Patient Instructions (Signed)
[place] at [facility] Discharge Instructions  You were seen today by Dr. Delton Coombes. He went over your recent results. Keep your appointment to have your colonoscopy. You will be scheduled for a CT scan of your chest and abdomen before your next visit. Dr. Delton Coombes will see you back in 3 months for labs and follow up.   Thank you for choosing [place] at [facility] to provide your oncology and hematology care.  To afford each patient quality time with our provider, please arrive at least 15 minutes before your scheduled appointment time.   If you have a lab appointment with the [facility] please come in thru the Main Entrance and check in at the main information desk  You need to re-schedule your appointment should you arrive 10 or more minutes late.  We strive to give you quality time with our providers, and arriving late affects you and other patients whose appointments are after yours.  Also, if you no show three or more times for appointments you may be dismissed from the clinic at the providers discretion.     Again, thank you for choosing [facility].  Our hope is that these requests will decrease the amount of time that you wait before being seen by our physicians.       _____________________________________________________________  Should you have questions after your visit to [facility], please contact our office at [phone] between the hours of 8:00 a.m. and 4:30 p.m.  Voicemails left after 4:00 p.m. will not be returned until the following business day.  For prescription refill requests, have your pharmacy contact our office and allow 72 hours.    Cancer Center Support Programs:   > Cancer Support Group  2nd Tuesday of the month 1pm-2pm, Journey Room

## 2020-04-19 NOTE — Progress Notes (Signed)
[place], [place] [address]   CLINIC:  Medical Oncology/Hematology  PCP:  Joel Jefferson, [address] [place] / [place] [address] [phone]   REASON FOR VISIT:  Follow-up for recurrent colon cancer  PRIOR THERAPY:  1. Partial colectomy on 03/02/2018. 2. FOLFOX x 13 cycles from 04/21/2018 to 10/06/2018. 3. LAR on 07/28/2019. 4. Oxaliplatin x 4 cycles from 09/24/2019 to 12/15/2019. 5. Chemoradiation with Xeloda.  NGS Results: Foundation 1 MS--stable  CURRENT THERAPY: Observation  BRIEF ONCOLOGIC HISTORY:  Oncology History  Malignant neoplasm of sigmoid colon [facility])   Initial Diagnosis   Cancer of sigmoid colon ([place])   03/27/2018 Genetic Testing   ALK c.350C>G VUS identified on the multicancer panel.  The Multi-Gene Panel offered by [facility] includes sequencing and/or deletion duplication testing of the following 85 genes: AIP, ALK, APC, ATM, AXIN2,BAP1,  BARD1, BLM, BMPR1A, BRCA1, BRCA2, BRIP1, CASR, CDC73, CDH1, CDK4, CDKN1B, CDKN1C, CDKN2A (p14ARF), CDKN2A (p16INK4a), CEBPA, CHEK2, CTNNA1, DICER1, DIS3L2, EGFR (c.2369C>T, p.Thr790Met variant only), EPCAM (Deletion/duplication testing only), FH, FLCN, GATA2, GPC3, GREM1 (Promoter region deletion/duplication testing only), HOXB13 (c.251G>A, p.Gly84Glu), HRAS, KIT, MAX, MEN1, MET, MITF (c.952G>A, p.Glu318Lys variant only), MLH1, MSH2, MSH3, MSH6, MUTYH, NBN, NF1, NF2, NTHL1, PALB2, PDGFRA, PHOX2B, PMS2, POLD1, POLE, POT1, PRKAR1A, PTCH1, PTEN, RAD50, RAD51C, RAD51D, RB1, RECQL4, RET, RNF43, RUNX1, SDHAF2, SDHA (sequence changes only), SDHB, SDHC, SDHD, SMAD4, SMARCA4, SMARCB1, SMARCE1, STK11, SUFU, TERC, TERT, TMEM127, TP53, TSC1, TSC2, VHL, WRN and WT1.   The report date is 03/27/2018.   04/21/2018 - 10/08/2018 Chemotherapy   The patient had palonosetron (ALOXI) injection 0.25 mg, 0.25 mg, Intravenous,  Once, 13 of 13 cycles Administration: 0.25 mg (04/21/2018), 0.25 mg (05/05/2018), 0.25 mg  (05/19/2018), 0.25 mg (06/02/2018), 0.25 mg (06/17/2018), 0.25 mg (06/30/2018), 0.25 mg (07/13/2018), 0.25 mg (08/11/2018), 0.25 mg (08/25/2018), 0.25 mg (09/08/2018), 0.25 mg (09/22/2018), 0.25 mg (10/06/2018) leucovorin 800 mg in dextrose 5 % 250 mL infusion, 876 mg, Intravenous,  Once, 13 of 13 cycles Administration: 800 mg (04/21/2018), 900 mg (05/05/2018), 800 mg (05/19/2018), 800 mg (06/02/2018), 900 mg (06/17/2018), 900 mg (06/30/2018), 900 mg (07/13/2018), 900 mg (08/11/2018), 900 mg (08/25/2018), 876 mg (09/22/2018), 900 mg (09/08/2018), 876 mg (10/06/2018) oxaliplatin (ELOXATIN) 185 mg in dextrose 5 % 500 mL chemo infusion, 85 mg/m2 = 185 mg, Intravenous,  Once, 13 of 13 cycles Dose modification: 68 mg/m2 (80 % of original dose 85 mg/m2, Cycle 12, Reason: Other (see comments), Comment: neuropathy), 42.5 mg/m2 (50 % of original dose 85 mg/m2, Cycle 13, Reason: Provider Judgment) Administration: 185 mg (04/21/2018), 185 mg (05/05/2018), 185 mg (05/19/2018), 185 mg (06/02/2018), 185 mg (06/17/2018), 185 mg (06/30/2018), 185 mg (07/13/2018), 185 mg (08/11/2018), 185 mg (08/25/2018), 185 mg (09/08/2018), 150 mg (09/22/2018), 95 mg (10/06/2018) fluorouracil (ADRUCIL) chemo injection 900 mg, 400 mg/m2 = 900 mg, Intravenous,  Once, 13 of 13 cycles Administration: 900 mg (04/21/2018), 900 mg (05/05/2018), 900 mg (05/19/2018), 900 mg (06/02/2018), 900 mg (06/17/2018), 900 mg (06/30/2018), 900 mg (07/13/2018), 900 mg (08/11/2018), 900 mg (08/25/2018), 900 mg (09/08/2018), 900 mg (09/22/2018), 900 mg (10/06/2018) fosaprepitant (EMEND) 150 mg, dexamethasone (DECADRON) 12 mg in sodium chloride 0.9 % 145 mL IVPB, , Intravenous,  Once, 10 of 10 cycles Administration:  (06/02/2018),  (06/17/2018),  (06/30/2018),  (07/13/2018),  (08/11/2018),  (08/25/2018),  (09/08/2018),  (09/22/2018),  (10/06/2018) fluorouracil (ADRUCIL) 5,250 mg in sodium chloride 0.9 % 145 mL chemo infusion, 2,400 mg/m2 = 5,250 mg, Intravenous, 1 Day/Dose, 13 of 13 cycles Administration:  5,250 mg (04/21/2018),  5,250 mg (05/05/2018), 5,250 mg (05/19/2018), 5,250 mg (06/02/2018), 5,250 mg (06/17/2018), 5,250 mg (06/30/2018), 5,250 mg (07/13/2018), 5,250 mg (08/11/2018), 5,250 mg (08/25/2018), 5,250 mg (09/08/2018), 5,250 mg (09/22/2018), 5,250 mg (10/06/2018)  for chemotherapy treatment.    09/24/2019 -  Chemotherapy   The patient had capecitabine (XELODA) 500 MG tablet, 850 mg/m2 = 2,000 mg, Oral, 2 times daily after meals, 1 of 1 cycle, Start date: 09/17/2019, End date: -- palonosetron (ALOXI) injection 0.25 mg, 0.25 mg, Intravenous,  Once, 4 of 4 cycles Administration: 0.25 mg (09/24/2019), 0.25 mg (10/15/2019), 0.25 mg (11/22/2019), 0.25 mg (12/15/2019) oxaliplatin (ELOXATIN) 300 mg in dextrose 5 % 500 mL chemo infusion, 130 mg/m2 = 300 mg, Intravenous,  Once, 4 of 4 cycles Administration: 300 mg (09/24/2019), 300 mg (10/15/2019), 300 mg (11/22/2019), 300 mg (12/15/2019) fosaprepitant (EMEND) 150 mg in sodium chloride 0.9 % 145 mL IVPB, 150 mg, Intravenous,  Once, 2 of 2 cycles Administration: 150 mg (11/22/2019), 150 mg (12/15/2019)  for chemotherapy treatment.    Malignant neoplasm of descending colon ([place])  07/28/2019 Initial Diagnosis   Malignant neoplasm of descending colon ([place])   09/24/2019 -  Chemotherapy   The patient had capecitabine (XELODA) 500 MG tablet, 850 mg/m2 = 2,000 mg, Oral, 2 times daily after meals, 1 of 1 cycle, Start date: 09/17/2019, End date: -- palonosetron (ALOXI) injection 0.25 mg, 0.25 mg, Intravenous,  Once, 4 of 4 cycles Administration: 0.25 mg (09/24/2019), 0.25 mg (10/15/2019), 0.25 mg (11/22/2019), 0.25 mg (12/15/2019) oxaliplatin (ELOXATIN) 300 mg in dextrose 5 % 500 mL chemo infusion, 130 mg/m2 = 300 mg, Intravenous,  Once, 4 of 4 cycles Administration: 300 mg (09/24/2019), 300 mg (10/15/2019), 300 mg (11/22/2019), 300 mg (12/15/2019) fosaprepitant (EMEND) 150 mg in sodium chloride 0.9 % 145 mL IVPB, 150 mg, Intravenous,  Once, 2 of 2 cycles Administration: 150 mg (11/22/2019), 150 mg (12/15/2019)  for  chemotherapy treatment.      CANCER STAGING: Cancer Staging No matching staging information was found for the patient.  INTERVAL HISTORY:  [name], a 45 y.o. male, returns for routine follow-up of his recurrent colon cancer. Joel Jefferson was last seen on 01/13/2020.   Today he reports feeling well. He complains of being tired, mostly from his job. The numbness and cold sensitivity have completely resolved. He denies having any new pains. He denies changes to his BM's and denies hematochezia.  He started working in November. He is scheduled for a colonoscopy on 2/4.   REVIEW OF SYSTEMS:  Review of Systems  Constitutional: Positive for fatigue (75%). Negative for appetite change.  Gastrointestinal: Negative for blood in stool, constipation and diarrhea.  Musculoskeletal: Negative for arthralgias and myalgias.  All other systems reviewed and are negative.   PAST MEDICAL/SURGICAL HISTORY:  Past Medical History:  Diagnosis Date  . Diabetes mellitus without complication ([place])   . Family history of breast cancer   . GERD (gastroesophageal reflux disease)   . History of kidney stones   . Hypercholesterolemia   . Hypertension   . Urethral stricture    Past Surgical History:  Procedure Laterality Date  . BALLOON DILATION  04/06/2012   Procedure: BALLOON DILATION;  Surgeon: Reece Packer, MD;  [facility];  Service: Urology;  Laterality: N/A;  . BIOPSY  02/24/2018   Procedure: BIOPSY;  Surgeon: Danie Binder, MD;  [facility];  Service: Endoscopy;;  colon  . BIOPSY  06/01/2019   Procedure: BIOPSY;  Surgeon: Danie Binder, MD;  [facility];  Service: Endoscopy;;  . BOWEL RESECTION N/A 07/28/2019   Procedure: LOW ANTERIOR BOWEL RESECTION;  Surgeon: Aviva Signs, MD;  Location: [facility];  Service: General;  Laterality: N/A;  . COLONOSCOPY WITH PROPOFOL N/A 02/24/2018   Dr. Oneida Alar: fungating, infiltrative partially  obstructing mass in sigmoid colon, area tattooed. ext/int hemorrhoids. Bx: adenocarcinoma  . COLONOSCOPY WITH PROPOFOL N/A 06/01/2019   Polypoid lesion at surgical anastomosis, s/p biopsy. Consistent with adenocarcinoma.   . CYSTOSCOPY WITH HOLMIUM LASER LITHOTRIPSY  08/07/2019   Procedure: CYSTOSCOPY WITH HOLMIUM LASER LITHOTRIPSY;  Surgeon: Festus Aloe, MD;  Location: [facility];  Service: Urology;;  . CYSTOSCOPY/RETROGRADE/URETEROSCOPY/STONE EXTRACTION WITH BASKET Left 08/07/2019   Procedure: CYSTOSCOPY/RETROGRADE/URETEROSCOPY/STONE EXTRACTION WITH BASKET;  Surgeon: Festus Aloe, MD;  Location: [facility];  Service: Urology;  Laterality: Left;  . ESOPHAGOGASTRODUODENOSCOPY (EGD) WITH PROPOFOL N/A 08/29/2015   Dr. Oneida Alar: possible proximal esophageal web s/p dilation, moderate gastritis, negative eosinophilic esophagitis   . ESOPHAGOGASTRODUODENOSCOPY (EGD) WITH PROPOFOL  06/01/2019   normal esophagus ,normal stomach, normal duodenum.   Marland Kitchen PARTIAL COLECTOMY N/A 03/02/2018   Procedure: PARTIAL COLECTOMY;  Surgeon: Aviva Signs, MD;  Location: [facility];  Service: General;  Laterality: N/A;  . PORTACATH PLACEMENT Left 04/13/2018   Procedure: INSERTION PORT-A-CATH;  Surgeon: Aviva Signs, MD;  Location: [facility];  Service: General;  Laterality: Left;  . SAVORY DILATION N/A 08/29/2015   Procedure: SAVORY DILATION;  Surgeon: Danie Binder, MD;  [facility];  Service: Endoscopy;  Laterality: N/A;  . URETHRAL DILATION    . WISDOM TOOTH EXTRACTION      SOCIAL HISTORY:  Social History   Socioeconomic History  . Marital status: Divorced    Spouse name: Not on file  . Number of children: Not on file  . Years of education: Not on file  . Highest education level: Not on file  Occupational History  . Not on file  Tobacco Use  . Smoking status: Former Smoker    Packs/day: 0.25    Years: 9.00    Pack years: 2.25    Types: Cigarettes    Quit date: 07/27/2011    Years since quitting: 8.7  .  Smokeless tobacco: Never Used  Vaping Use  . Vaping Use: Never used  Substance and Sexual Activity  . Alcohol use: Yes    Alcohol/week: 6.0 standard drinks    Types: 6 Cans of beer per week  . Drug use: No  . Sexual activity: Yes    Birth control/protection: None  Other Topics Concern  . Not on file  Social History Narrative  . Not on file   Social Determinants of Health   Financial Resource Strain: Not on file  Food Insecurity: Not on file  Transportation Needs: Not on file  Physical Activity: Not on file  Stress: Not on file  Social Connections: Not on file  Intimate Partner Violence: Not on file    FAMILY HISTORY:  Family History  Problem Relation Age of Onset  . Dementia Mother   . Dementia Maternal Aunt   . Dementia Maternal Grandmother   . COPD Maternal Grandfather   . Breast cancer Maternal Aunt 42  . Breast cancer Maternal Aunt        dx in her 48s  . Lung cancer Maternal Aunt   . Healthy Son   . Colon cancer Neg Hx     CURRENT MEDICATIONS:  Current Outpatient Medications  Medication Sig Dispense  Refill  . CONTOUR NEXT TEST test strip USE TO CHECK BLOOD SUGAR BID    . glipiZIDE (GLUCOTROL XL) 5 MG 24 hr tablet Take two tablets daily until you see Dr. Hilma Favors (Patient taking differently: 10 mg in the morning and at bedtime.) 30 tablet 3  . LORazepam (ATIVAN) 0.5 MG tablet Take 1 tablet (0.5 mg total) by mouth every 8 (eight) hours. 30 tablet 2  . losartan (COZAAR) 100 MG tablet Take 100 mg by mouth daily.    . magnesium oxide (MAG-OX) 400 MG tablet Take 400 mg by mouth daily.    . metFORMIN (GLUCOPHAGE) 500 MG tablet Take 500 mg by mouth 2 (two) times daily with a meal.    . Multiple Vitamin (MULTIVITAMIN) tablet Take 1 tablet by mouth daily.     . Na Sulfate-K Sulfate-Mg Sulf (SUPREP BOWEL PREP KIT) 17.5-3.13-1.6 GM/177ML SOLN Take 1 kit by mouth as directed. 354 mL 0  . OLANZapine (ZYPREXA) 10 MG tablet TAKE 1 TABLET BY MOUTH DAILY AT BEDTIME 30 tablet 0   . Potassium 99 MG TABS Take 99 mg by mouth daily.    . pravastatin (PRAVACHOL) 20 MG tablet Take 20 mg by mouth daily.   1  . topiramate (TOPAMAX) 100 MG tablet Take 100 mg by mouth daily.     Marland Kitchen bismuth subsalicylate (PEPTO BISMOL) 262 MG/15ML suspension Take 30 mLs by mouth every 6 (six) hours as needed. (Patient not taking: Reported on 04/19/2020)     No current facility-administered medications for this visit.    ALLERGIES:  No Known Allergies  PHYSICAL EXAM:  Performance status (ECOG): 1 - Symptomatic but completely ambulatory  Vitals:   04/19/20 1053  BP: 123/90  Pulse: 93  Resp: 18  Temp: (!) 97.2 F (36.2 C)  SpO2: 98%   Wt Readings from Last 3 Encounters:  04/19/20 237 lb 6.4 oz (107.7 kg)  01/13/20 233 lb 11.2 oz (106 kg)  12/15/19 232 lb 9.6 oz (105.5 kg)   Physical Exam Vitals reviewed.  Constitutional:      Appearance: Normal appearance. He is obese.  Cardiovascular:     Rate and Rhythm: Normal rate and regular rhythm.     Pulses: Normal pulses.     Heart sounds: Normal heart sounds.  Pulmonary:     Effort: Pulmonary effort is normal.     Breath sounds: Normal breath sounds.  Abdominal:     Palpations: Abdomen is soft. There is no hepatomegaly or mass.     Tenderness: There is no abdominal tenderness.     Hernia: No hernia is present.  Musculoskeletal:     Right lower leg: No edema.     Left lower leg: No edema.  Neurological:     General: No focal deficit present.     Mental Status: He is alert and oriented to person, place, and time.  Psychiatric:        Mood and Affect: Mood normal.        Behavior: Behavior normal.      LABORATORY DATA:  I have reviewed the labs as listed.  CBC Latest Ref Rng & Units 04/14/2020 01/10/2020 12/15/2019  WBC 4.0 - 10.5 K/uL 5.6 5.4 5.5  Hemoglobin 13.0 - 17.0 g/dL 14.7 15.5 14.2  Hematocrit 39.0 - 52.0 % 44.3 44.3 42.5  Platelets 150 - 400 K/uL 166 135(L) 165   CMP Latest Ref Rng & Units 04/14/2020 01/10/2020  12/15/2019  Glucose 70 - 99 mg/dL 132(H) 164(H) 332(H)  BUN  6 - 20 mg/dL '18 16 14  ' Creatinine 0.61 - 1.24 mg/dL 0.79 0.86 0.87  Sodium 135 - 145 mmol/L 137 137 139  Potassium 3.5 - 5.1 mmol/L 3.7 4.4 4.0  Chloride 98 - 111 mmol/L 107 104 106  CO2 22 - 32 mmol/L 22 21(L) 23  Calcium 8.9 - 10.3 mg/dL 8.6(L) 9.4 9.1  Total Protein 6.5 - 8.1 g/dL 6.8 7.1 6.5  Total Bilirubin 0.3 - 1.2 mg/dL 0.8 0.8 0.9  Alkaline Phos 38 - 126 U/L 41 45 53  AST 15 - 41 U/L 30 35 26  ALT 0 - 44 U/L 53(H) 46(H) 42   Lab Results  Component Value Date   CEA1 2.0 04/14/2020   CEA1 3.3 01/10/2020   CEA1 4.2 12/15/2019    DIAGNOSTIC IMAGING:  I have independently reviewed the scans and discussed with the patient. No results found.   ASSESSMENT:  1. Stage III (PT3PN2B) sigmoid colon adenocarcinoma, MSI stable, status post sigmoid colon resection on 03/02/2018, 14/35 lymph nodes positive. -12 cycles of FOLFOX from 04/21/2018 through 10/06/2018. -Repeat colonoscopy on 06/01/2019 showed recurrence at the anastomotic area, biopsy consistent with adenocarcinoma. -PET scan on 07/05/2019 shows increased uptake in the anastomotic area. There are 2 small 5 mm nodules in the right upper lobe and left upper lobe which are new. These are not hypermetabolic on PET scan given the small size. Patient also had COVID-19 infection earlier this year. -LAR on 07/28/2019, pathology showing recurrent moderately differentiated adenocarcinoma, metastatic carcinoma in 3/6 lymph nodes, margins negative. Grade 2. RPT2, RPN1B. -MMR is preserved. -3 months of CAPOX from 09/24/2019 through 12/15/2019. -CT CAP on 01/10/2020 shows no evidence of recurrence or metastatic disease.  Stable small lung nodules in bilateral lower lobes unchanged from 2019, benign.   PLAN:  1. Recurrent sigmoid colon adenocarcinoma: -He is continuing to work as EMT. - Denies any change in bowel habits or bleeding. - He is scheduled for colonoscopy on 05/12/2020. -  Reviewed labs from 04/14/2020 which showed normal LFTs except slightly elevated ALT.  CBC was normal.  CEA was 2.0. - Recommend follow-up in 3 months with repeat CT scan of the abdomen and pelvis along with CEA level. - He is complaining of tiredness more than usual after working.  Will check TSH and testosterone levels.  2. Peripheral neuropathy: -He reports that numbness and tingling in the extremities has improved completely.  3. Elevated LFTs: -ALT mildly elevated at 53.  Closely monitor.  Likely from fatty liver from oxaliplatin.   Orders placed this encounter:  No orders of the defined types were placed in this encounter.    Derek Jack, MD [facility] [phone]   I, Milinda Antis, am acting as a scribe for Dr. Sanda Linger.  I, Derek Jack MD, have reviewed the above documentation for accuracy and completeness, and I agree with the above.

## 2020-04-20 ENCOUNTER — Ambulatory Visit (HOSPITAL_COMMUNITY): Payer: No Typology Code available for payment source | Admitting: Hematology

## 2020-04-22 ENCOUNTER — Other Ambulatory Visit: Payer: No Typology Code available for payment source

## 2020-04-22 DIAGNOSIS — Z20822 Contact with and (suspected) exposure to covid-19: Secondary | ICD-10-CM

## 2020-04-25 LAB — NOVEL CORONAVIRUS, NAA: SARS-CoV-2, NAA: NOT DETECTED

## 2020-05-08 ENCOUNTER — Telehealth: Payer: Self-pay | Admitting: *Deleted

## 2020-05-08 NOTE — Telephone Encounter (Signed)
I spoke with the patient and he's very upset about not having his procedure this Friday.  I tried to explain to him about the [place], however he is admant that he needs to have his procedure done on Friday.  I told him that I will do everything possible to make sure he has the first available slot as soon as the schedule opens up.  He thanked me and disconnected the call.  Sending to [employer] as a [employer]

## 2020-05-08 NOTE — Telephone Encounter (Signed)
Called pt and explained why we needed to cancel procedure for Friday. Patient was very upset. He states he needed to speak with someone else regarding this because this is very upsetting.

## 2020-05-08 NOTE — Telephone Encounter (Signed)
Pt called to discuss why his procedure was cancelled. Pt states he's had cancer before. Pt is aware that he will get a call from our office manager per [place].

## 2020-05-09 NOTE — Telephone Encounter (Signed)
Received a call from [place] in endo. Cleo spoke with patient and he has been added to schedule 2/7 at 8:30am, arrival 7:00am. Per carolyn patient was advised cleo explained to patient that it could be a possibility it could be cancelled for next week. Hoyle Sauer also scheduled patient covid test with him on the phone.  I called patient and advised will send his prep instructions to his [place]. Patient already has the prep at home. He voiced understanding.

## 2020-05-10 NOTE — Progress Notes (Signed)
I spoke with patient today about his covid appointment for tomorrow. He said it was canceled and rescheduled for Friday (05/12/20). The covid order is still in the system. I created another order for Friday. I'll make Joel Jefferson aware as she is the only one who can cancel covid orders.

## 2020-05-11 ENCOUNTER — Other Ambulatory Visit (HOSPITAL_COMMUNITY): Admission: RE | Admit: 2020-05-11 | Payer: No Typology Code available for payment source | Source: Ambulatory Visit

## 2020-05-11 ENCOUNTER — Other Ambulatory Visit: Payer: Self-pay

## 2020-05-11 ENCOUNTER — Encounter (HOSPITAL_COMMUNITY): Payer: Self-pay | Admitting: *Deleted

## 2020-05-12 ENCOUNTER — Other Ambulatory Visit (HOSPITAL_COMMUNITY)
Admission: RE | Admit: 2020-05-12 | Discharge: 2020-05-12 | Disposition: A | Discharge status: Home / Self Care | Payer: 59 | Source: Ambulatory Visit | Attending: Internal Medicine | Admitting: Internal Medicine

## 2020-05-12 DIAGNOSIS — Z01812 Encounter for preprocedural laboratory examination: Secondary | ICD-10-CM | POA: Diagnosis present

## 2020-05-12 DIAGNOSIS — Z20822 Contact with and (suspected) exposure to covid-19: Secondary | ICD-10-CM | POA: Insufficient documentation

## 2020-05-12 LAB — SARS CORONAVIRUS 2 (TAT 6-24 HRS): SARS Coronavirus 2: NEGATIVE

## 2020-05-15 ENCOUNTER — Encounter (HOSPITAL_COMMUNITY)
Admission: RE | Disposition: A | Discharge status: Home / Self Care | Payer: Self-pay | Source: Home / Self Care | Attending: Internal Medicine

## 2020-05-15 ENCOUNTER — Encounter (HOSPITAL_COMMUNITY): Payer: Self-pay

## 2020-05-15 ENCOUNTER — Ambulatory Visit (HOSPITAL_COMMUNITY)
Admission: RE | Admit: 2020-05-15 | Discharge: 2020-05-15 | Disposition: A | Discharge status: Home / Self Care | Payer: No Typology Code available for payment source | Attending: Internal Medicine | Admitting: Internal Medicine

## 2020-05-15 ENCOUNTER — Other Ambulatory Visit: Payer: Self-pay

## 2020-05-15 ENCOUNTER — Ambulatory Visit (HOSPITAL_COMMUNITY): Payer: No Typology Code available for payment source | Admitting: Certified Registered Nurse Anesthetist

## 2020-05-15 DIAGNOSIS — Z7984 Long term (current) use of oral hypoglycemic drugs: Secondary | ICD-10-CM | POA: Diagnosis not present

## 2020-05-15 DIAGNOSIS — C187 Malignant neoplasm of sigmoid colon: Secondary | ICD-10-CM | POA: Diagnosis not present

## 2020-05-15 DIAGNOSIS — Z85038 Personal history of other malignant neoplasm of large intestine: Secondary | ICD-10-CM | POA: Insufficient documentation

## 2020-05-15 DIAGNOSIS — E119 Type 2 diabetes mellitus without complications: Secondary | ICD-10-CM | POA: Insufficient documentation

## 2020-05-15 DIAGNOSIS — Z87891 Personal history of nicotine dependence: Secondary | ICD-10-CM | POA: Diagnosis not present

## 2020-05-15 DIAGNOSIS — K648 Other hemorrhoids: Secondary | ICD-10-CM | POA: Diagnosis not present

## 2020-05-15 DIAGNOSIS — Z9221 Personal history of antineoplastic chemotherapy: Secondary | ICD-10-CM | POA: Insufficient documentation

## 2020-05-15 DIAGNOSIS — Z79899 Other long term (current) drug therapy: Secondary | ICD-10-CM | POA: Diagnosis not present

## 2020-05-15 DIAGNOSIS — K219 Gastro-esophageal reflux disease without esophagitis: Secondary | ICD-10-CM | POA: Insufficient documentation

## 2020-05-15 DIAGNOSIS — D490 Neoplasm of unspecified behavior of digestive system: Secondary | ICD-10-CM

## 2020-05-15 DIAGNOSIS — Z820 Family history of epilepsy and other diseases of the nervous system: Secondary | ICD-10-CM | POA: Insufficient documentation

## 2020-05-15 DIAGNOSIS — Z803 Family history of malignant neoplasm of breast: Secondary | ICD-10-CM | POA: Insufficient documentation

## 2020-05-15 DIAGNOSIS — Z1211 Encounter for screening for malignant neoplasm of colon: Secondary | ICD-10-CM | POA: Insufficient documentation

## 2020-05-15 DIAGNOSIS — Z980 Intestinal bypass and anastomosis status: Secondary | ICD-10-CM | POA: Diagnosis not present

## 2020-05-15 DIAGNOSIS — Z825 Family history of asthma and other chronic lower respiratory diseases: Secondary | ICD-10-CM | POA: Diagnosis not present

## 2020-05-15 DIAGNOSIS — Z801 Family history of malignant neoplasm of trachea, bronchus and lung: Secondary | ICD-10-CM | POA: Insufficient documentation

## 2020-05-15 HISTORY — PX: COLONOSCOPY WITH PROPOFOL: SHX5780

## 2020-05-15 HISTORY — PX: SUBMUCOSAL TATTOO INJECTION: SHX6856

## 2020-05-15 HISTORY — PX: BIOPSY: SHX5522

## 2020-05-15 LAB — GLUCOSE, CAPILLARY: Glucose-Capillary: 146 mg/dL — ABNORMAL HIGH (ref 70–99)

## 2020-05-15 SURGERY — COLONOSCOPY WITH PROPOFOL
Anesthesia: Monitor Anesthesia Care

## 2020-05-15 MED ORDER — LACTATED RINGERS IV SOLN: INTRAVENOUS | Status: DC

## 2020-05-15 MED ORDER — SPOT INK MARKER SYRINGE KIT
PACK | SUBMUCOSAL | Status: AC
  Filled 2020-05-15: qty 5

## 2020-05-15 MED ORDER — LACTATED RINGERS IV SOLN
INTRAVENOUS | Status: AC | PRN
  Administered 2020-05-15: 1000 mL via INTRAVENOUS

## 2020-05-15 MED ORDER — SPOT INK MARKER SYRINGE KIT
PACK | SUBMUCOSAL | Status: DC | PRN
  Administered 2020-05-15: 2 mL via SUBMUCOSAL

## 2020-05-15 MED ORDER — PROPOFOL 500 MG/50ML IV EMUL
INTRAVENOUS | Status: DC | PRN
  Administered 2020-05-15: 100 ug/kg/min via INTRAVENOUS

## 2020-05-15 MED ORDER — PROPOFOL 1000 MG/100ML IV EMUL
INTRAVENOUS | Status: AC
  Filled 2020-05-15: qty 200

## 2020-05-15 NOTE — Op Note (Signed)
[facility] Patient Name: Joel Jefferson Procedure Date: 05/15/2020 MRN: [id number] Attending MD: Elon Alas. Abbey Chatters , DO Date of Birth: Oct 31, 1975 CSN: [id number] Age: 45 Admit Type: Outpatient Procedure:                Colonoscopy Indications:              High risk colon cancer surveillance: Personal                            history of colon cancer Providers:                Elon Alas. Abbey Chatters, DO, Erenest Rasher, RN, Lesia Sago, Technician Referring MD:              Medicines:                See the Anesthesia note for documentation of the                            administered medications Complications:            No immediate complications. Estimated Blood Loss:     Estimated blood loss was minimal. Procedure:                Pre-Anesthesia Assessment:                           - The anesthesia plan was to use monitored                            anesthesia care (MAC).                           After obtaining informed consent, the colonoscope                            was passed under direct vision. Throughout the                            procedure, the patient's blood pressure, pulse, and                            oxygen saturations were monitored continuously. The                            [id number]-H190DL ([id number] Olympus pediatric colonscope                            was introduced through the anus and advanced to the                            the cecum, identified by appendiceal orifice and                            ileocecal valve. The colonoscopy was performed  without difficulty. The patient tolerated the                            procedure well. The quality of the bowel                            preparation was evaluated using the BBPS [facility]                            Bowel Preparation Scale) with scores of: Right                            Colon = 3, Transverse Colon = 3 and Left Colon = 3                             (entire mucosa seen well with no residual staining,                            small fragments of stool or opaque liquid). The                            total BBPS score equals 9. Scope In: 8:42:17 AM Scope Out: 8:58:21 AM Scope Withdrawal Time: 0 hours 13 minutes 33 seconds  Total Procedure Duration: 0 hours 16 minutes 4 seconds  Findings:      The perianal and digital rectal examinations were normal.      Non-bleeding internal hemorrhoids were found during endoscopy.      There was evidence of a prior end-to-end colo-colonic anastomosis in the       sigmoid colon. This was patent. The anastomosis was traversed.      A polypoid, submucosal and ulcerated non-obstructing mass was found in       the sigmoid colon just distal to anastamosis at approximately 18 cm from       the anal verge. The mass was partially circumferential (involving       one-third of the lumen circumference). No bleeding was present. Mucosa       was biopsied with a cold forceps for histology. Area distal to mass was       tattooed with an injection of 3 mL of Spot (carbon black). Impression:               - Non-bleeding internal hemorrhoids.                           - Patent end-to-end colo-colonic anastomosis.                           - Rule out malignancy, tumor in the sigmoid colon.                            Biopsied. Tattooed. Moderate Sedation:      Not Applicable - Patient had care per Anesthesia. Recommendation:           - Patient has a contact number available for  emergencies. The signs and symptoms of potential                            delayed complications were discussed with the                            patient. Return to normal activities tomorrow.                            Written discharge instructions were provided to the                            patient.                           - Resume previous diet.                           -  Continue present medications.                           - Await pathology results.                           - Repeat colonoscopy date to be determined after                            pending pathology results are reviewed for                            surveillance based on pathology results.                           - I discussed case with Dr. Arnoldo Morale who states he                            will be able to see patient in clinic this week. Procedure Code(s):        --- Professional ---                           [phone], Colonoscopy, flexible; with directed                            submucosal injection(s), any substance Diagnosis Code(s):        --- Professional ---                           B09.628, Personal history of other malignant                            neoplasm of large intestine                           Z98.0, Intestinal bypass and anastomosis status  D49.0, Neoplasm of unspecified behavior of                            digestive system                           K64.8, Other hemorrhoids CPT copyright 2019 [employer]. All rights reserved. The codes documented in this report are preliminary and upon coder review may  be revised to meet current compliance requirements. Elon Alas. Abbey Chatters, DO [place] Abbey Chatters, DO 05/15/2020 9:08:41 AM This report has been signed electronically. Number of Addenda: 0

## 2020-05-15 NOTE — Discharge Instructions (Signed)
  Colonoscopy Discharge Instructions  Read the instructions outlined below and refer to this sheet in the next few weeks. These discharge instructions provide you with general information on caring for yourself after you leave the hospital. Your doctor may also give you specific instructions. While your treatment has been planned according to the most current medical practices available, unavoidable complications occasionally occur.   ACTIVITY  You may resume your regular activity, but move at a slower pace for the next 24 hours.   Take frequent rest periods for the next 24 hours.   Walking will help get rid of the air and reduce the bloated feeling in your belly (abdomen).   No driving for 24 hours (because of the medicine (anesthesia) used during the test).    Do not sign any important legal documents or operate any machinery for 24 hours (because of the anesthesia used during the test).  NUTRITION  Drink plenty of fluids.   You may resume your normal diet as instructed by your doctor.   Begin with a light meal and progress to your normal diet. Heavy or fried foods are harder to digest and may make you feel sick to your stomach (nauseated).   Avoid alcoholic beverages for 24 hours or as instructed.  MEDICATIONS  You may resume your normal medications unless your doctor tells you otherwise.  WHAT YOU CAN EXPECT TODAY  Some feelings of bloating in the abdomen.   Passage of more gas than usual.   Spotting of blood in your stool or on the toilet paper.  IF YOU HAD POLYPS REMOVED DURING THE COLONOSCOPY:  No aspirin products for 7 days or as instructed.   No alcohol for 7 days or as instructed.   Eat a soft diet for the next 24 hours.  FINDING OUT THE RESULTS OF YOUR TEST Not all test results are available during your visit. If your test results are not back during the visit, make an appointment with your caregiver to find out the results. Do not assume everything is normal if  you have not heard from your caregiver or the medical facility. It is important for you to follow up on all of your test results.  SEEK IMMEDIATE MEDICAL ATTENTION IF:  You have more than a spotting of blood in your stool.   Your belly is swollen (abdominal distention).   You are nauseated or vomiting.   You have a temperature over 101.   You have abdominal pain or discomfort that is severe or gets worse throughout the day.   Unfortunately it appears that your colon cancer has returned at the surgical site.  I took extensive biopsies and should have these results back within a day or so.  I have already discussed case with Dr. Arnoldo Morale who is expecting your call.  He states he will be able to see you either Tuesday or Thursday this week and to just call the office to make appointment.  We will tentatively plan on repeat colonoscopy in approximately 1 year after surgery.  I hope you have a great rest of your week!  Elon Alas. Abbey Chatters, D.O. Gastroenterology and Hepatology  [facility]

## 2020-05-15 NOTE — Anesthesia Preprocedure Evaluation (Signed)
Anesthesia Evaluation  Patient identified by MRN, date of birth, ID band Patient awake    Reviewed: Allergy & Precautions, H&P , NPO status , Patient's Chart, lab work & pertinent test results, reviewed documented beta blocker date and time   History of Anesthesia Complications Negative for: history of anesthetic complications  Airway Mallampati: II  TM Distance: >3 FB Neck ROM: full    Dental  (+) Poor Dentition, Dental Advisory Given, Missing   Pulmonary neg pulmonary ROS, former smoker,    Pulmonary exam normal breath sounds clear to auscultation       Cardiovascular Exercise Tolerance: Good hypertension, Pt. on medications  Rhythm:regular Rate:Normal     Neuro/Psych negative neurological ROS  negative psych ROS   GI/Hepatic GERD  Medicated and Controlled,Elevated LFTs   Endo/Other  negative endocrine ROSdiabetes, Well Controlled, Type 2  Renal/GU Renal disease (left ureteral stone, left hydronephrosis)  negative genitourinary   Musculoskeletal negative musculoskeletal ROS (+)   Abdominal   Peds  Hematology negative hematology ROS (+)   Anesthesia Other Findings   Reproductive/Obstetrics negative OB ROS                             Anesthesia Physical  Anesthesia Plan  ASA: II  Anesthesia Plan: MAC   Post-op Pain Management:    Induction: Intravenous  PONV Risk Score and Plan: 1 and Propofol infusion and Treatment may vary due to age or medical condition  Airway Management Planned:   Additional Equipment:   Intra-op Plan:   Post-operative Plan:   Informed Consent: I have reviewed the patients History and Physical, chart, labs and discussed the procedure including the risks, benefits and alternatives for the proposed anesthesia with the patient or authorized representative who has indicated his/her understanding and acceptance.     Dental advisory given  Plan  Discussed with: CRNA  Anesthesia Plan Comments:         Anesthesia Quick Evaluation

## 2020-05-15 NOTE — H&P (Addendum)
Primary Care Physician:  Sharilyn Sites, MD Primary Gastroenterologist:  Dr. Abbey Chatters  Pre-Procedure History & Physical: HPI:  Joel Jefferson. is a 45 y.o. male with history of stage 3 sigmoid colon cancer in Nov 2019, s/p sigmoid colon resection at that time and undergoing chemo. Recurrent colon cancer at the anastomosis in Feb 2021, undergoing LAR by Dr. Arnoldo Morale in April 2021 and completing chemo June through Sept 2021. Here for surveillance colonoscopy.   Patient denies melena or hematochezia.  No abdominal pain or unintentional weight loss.  No change in bowel habits.  Overall feels well from a GI standpoint.  Past Medical History:  Diagnosis Date  . Diabetes mellitus without complication ([place])   . Family history of breast cancer   . GERD (gastroesophageal reflux disease)   . History of kidney stones   . Hypercholesterolemia   . Hypertension   . Urethral stricture     Past Surgical History:  Procedure Laterality Date  . BALLOON DILATION  04/06/2012   Procedure: BALLOON DILATION;  Surgeon: Reece Packer, MD;  [facility];  Service: Urology;  Laterality: N/A;  . BIOPSY  02/24/2018   Procedure: BIOPSY;  Surgeon: Danie Binder, MD;  [facility];  Service: Endoscopy;;  colon  . BIOPSY  06/01/2019   Procedure: BIOPSY;  Surgeon: Danie Binder, MD;  [facility];  Service: Endoscopy;;  . BOWEL RESECTION N/A 07/28/2019   Procedure: LOW ANTERIOR BOWEL RESECTION;  Surgeon: Aviva Signs, MD;  Location: [facility];  Service: General;  Laterality: N/A;  . COLONOSCOPY WITH PROPOFOL N/A 02/24/2018   Dr. Oneida Alar: fungating, infiltrative partially obstructing mass in sigmoid colon, area tattooed. ext/int hemorrhoids. Bx: adenocarcinoma  . COLONOSCOPY WITH PROPOFOL N/A 06/01/2019   Polypoid lesion at surgical anastomosis, s/p biopsy. Consistent with adenocarcinoma.   . CYSTOSCOPY WITH HOLMIUM LASER LITHOTRIPSY  08/07/2019   Procedure: CYSTOSCOPY  WITH HOLMIUM LASER LITHOTRIPSY;  Surgeon: Festus Aloe, MD;  Location: [facility];  Service: Urology;;  . CYSTOSCOPY/RETROGRADE/URETEROSCOPY/STONE EXTRACTION WITH BASKET Left 08/07/2019   Procedure: CYSTOSCOPY/RETROGRADE/URETEROSCOPY/STONE EXTRACTION WITH BASKET;  Surgeon: Festus Aloe, MD;  Location: [facility];  Service: Urology;  Laterality: Left;  . ESOPHAGOGASTRODUODENOSCOPY (EGD) WITH PROPOFOL N/A 08/29/2015   Dr. Oneida Alar: possible proximal esophageal web s/p dilation, moderate gastritis, negative eosinophilic esophagitis   . ESOPHAGOGASTRODUODENOSCOPY (EGD) WITH PROPOFOL  06/01/2019   normal esophagus ,normal stomach, normal duodenum.   Marland Kitchen PARTIAL COLECTOMY N/A 03/02/2018   Procedure: PARTIAL COLECTOMY;  Surgeon: Aviva Signs, MD;  Location: [facility];  Service: General;  Laterality: N/A;  . PORTACATH PLACEMENT Left 04/13/2018   Procedure: INSERTION PORT-A-CATH;  Surgeon: Aviva Signs, MD;  Location: [facility];  Service: General;  Laterality: Left;  . SAVORY DILATION N/A 08/29/2015   Procedure: SAVORY DILATION;  Surgeon: Danie Binder, MD;  [facility];  Service: Endoscopy;  Laterality: N/A;  . URETHRAL DILATION    . WISDOM TOOTH EXTRACTION      Prior to Admission medications   Medication Sig Start Date End Date Taking? Authorizing Provider  CONTOUR NEXT TEST test strip USE TO CHECK BLOOD SUGAR BID 08/25/18  Yes [provider]  glipiZIDE (GLUCOTROL XL) 5 MG 24 hr tablet Take two tablets daily until you see Dr. Hilma Favors Patient taking differently: 10 mg in the morning and at bedtime. 07/31/19  Yes Aviva Signs, MD  losartan (COZAAR) 100 MG tablet Take 100 mg by mouth daily. 04/28/19  Yes [provider]  magnesium oxide (MAG-OX) 400 MG tablet Take 400 mg by mouth daily.   Yes [provider]  metFORMIN (GLUCOPHAGE) 500 MG tablet Take 500 mg by mouth 2 (two) times daily with a meal.   Yes [provider]  Multiple Vitamin (MULTIVITAMIN) tablet Take 1  tablet by mouth daily.    Yes [provider]  Na Sulfate-K Sulfate-Mg Sulf (SUPREP BOWEL PREP KIT) 17.5-3.13-1.6 GM/177ML SOLN Take 1 kit by mouth as directed. 03/16/20  Yes Eloise Harman, DO  Potassium 99 MG TABS Take 99 mg by mouth daily.   Yes [provider]  pravastatin (PRAVACHOL) 20 MG tablet Take 20 mg by mouth daily.  08/30/14  Yes [provider]  topiramate (TOPAMAX) 100 MG tablet Take 100 mg by mouth daily.    Yes [provider]  LORazepam (ATIVAN) 0.5 MG tablet Take 1 tablet (0.5 mg total) by mouth every 8 (eight) hours. Patient not taking: No sig reported 09/30/19   Baird Cancer, PA-C  OLANZapine (ZYPREXA) 10 MG tablet TAKE 1 TABLET BY MOUTH DAILY AT BEDTIME Patient not taking: No sig reported 11/17/19   Derek Jack, MD    Allergies as of 03/16/2020  . (No Known Allergies)    Family History  Problem Relation Age of Onset  . Dementia Mother   . Dementia Maternal Aunt   . Dementia Maternal Grandmother   . COPD Maternal Grandfather   . Breast cancer Maternal Aunt 42  . Breast cancer Maternal Aunt        dx in her 34s  . Lung cancer Maternal Aunt   . Healthy Son   . Colon cancer Neg Hx     Social History   Socioeconomic History  . Marital status: Divorced    Spouse name: Not on file  . Number of children: Not on file  . Years of education: Not on file  . Highest education level: Not on file  Occupational History  . Not on file  Tobacco Use  . Smoking status: Former Smoker    Packs/day: 0.25    Years: 9.00    Pack years: 2.25    Types: Cigarettes    Quit date: 07/27/2011    Years since quitting: 8.8  . Smokeless tobacco: Never Used  Vaping Use  . Vaping Use: Never used  Substance and Sexual Activity  . Alcohol use: Yes    Alcohol/week: 6.0 standard drinks    Types: 6 Cans of beer per week  . Drug use: No  . Sexual activity: Yes    Birth control/protection: None  Other Topics Concern  . Not on file   Social History Narrative  . Not on file   Social Determinants of Health   Financial Resource Strain: Not on file  Food Insecurity: Not on file  Transportation Needs: Not on file  Physical Activity: Not on file  Stress: Not on file  Social Connections: Not on file  Intimate Partner Violence: Not on file    Review of Systems: See HPI, otherwise negative ROS  Impression/Plan: Joel Jefferson. is here for a colonoscopy to be performed for surveillance due to personal history of colon cancer. See HPI.  The risks of the procedure including infection, bleed, or perforation as well as benefits, limitations, alternatives and imponderables have been reviewed with the patient. Questions have been answered. All parties agreeable.

## 2020-05-15 NOTE — Anesthesia Postprocedure Evaluation (Signed)
Anesthesia Post Note  Patient: Joel Jefferson.  Procedure(s) Performed: COLONOSCOPY WITH PROPOFOL (N/A ) BIOPSY SUBMUCOSAL TATTOO INJECTION     Patient location during evaluation: PACU Anesthesia Type: MAC Level of consciousness: awake and alert Pain management: pain level controlled Vital Signs Assessment: post-procedure vital signs reviewed and stable Respiratory status: spontaneous breathing Cardiovascular status: stable Anesthetic complications: no   No complications documented.  Last Vitals:  Vitals:   05/15/20 0904 05/15/20 0914  BP: 119/65 (!) 141/93  Pulse: 92 96  Resp: 14 16  Temp: 36.7 C   SpO2: 99% 100%    Last Pain:  Vitals:   05/15/20 0914  TempSrc:   PainSc: 0-No pain                 Nolon Nations

## 2020-05-15 NOTE — Transfer of Care (Signed)
Immediate Anesthesia Transfer of Care Note  Patient: Joel Jefferson.  Procedure(s) Performed: Procedure(s) with comments: COLONOSCOPY WITH PROPOFOL (N/A) - 9:00am BIOPSY SUBMUCOSAL TATTOO INJECTION  Patient Location: PACU and Endoscopy Unit  Anesthesia Type:MAC  Level of Consciousness: awake, alert  and oriented  Airway & Oxygen Therapy: Patient Spontanous Breathing and Patient connected to nasal cannula oxygen  Post-op Assessment: Report given to RN and Post -op Vital signs reviewed and stable  Post vital signs: Reviewed and stable  Last Vitals:  Vitals:   05/15/20 0719  BP: (!) 141/96  Pulse: 73  Resp: 17  Temp: 37 C  SpO2: 70%    Complications: No apparent anesthesia complications

## 2020-05-16 ENCOUNTER — Encounter (HOSPITAL_COMMUNITY): Payer: Self-pay | Admitting: Internal Medicine

## 2020-05-16 LAB — SURGICAL PATHOLOGY

## 2020-05-17 ENCOUNTER — Telehealth: Payer: Self-pay | Admitting: Internal Medicine

## 2020-05-17 NOTE — Telephone Encounter (Signed)
Patient called asking if we were going to call him about his results, said that he saw them in my chart and knows that it is cancer. [phone]

## 2020-05-17 NOTE — Telephone Encounter (Signed)
Called and discussed these results with patient.  Thank you

## 2020-05-17 NOTE — Telephone Encounter (Signed)
NOTED

## 2020-05-17 NOTE — Telephone Encounter (Signed)
Pt is calling wanting results from his procedure, but you haven't sent me anything yet.

## 2020-05-25 ENCOUNTER — Other Ambulatory Visit: Payer: Self-pay

## 2020-05-25 ENCOUNTER — Ambulatory Visit (INDEPENDENT_AMBULATORY_CARE_PROVIDER_SITE_OTHER): Payer: No Typology Code available for payment source | Admitting: General Surgery

## 2020-05-25 ENCOUNTER — Encounter: Payer: Self-pay | Admitting: General Surgery

## 2020-05-25 VITALS — BP 133/88 | HR 88 | Temp 97.4°F | Resp 14 | Ht 70.0 in | Wt 241.0 lb

## 2020-05-25 DIAGNOSIS — C189 Malignant neoplasm of colon, unspecified: Secondary | ICD-10-CM | POA: Diagnosis not present

## 2020-05-25 NOTE — Progress Notes (Signed)
Joel Jefferson.; [id number]; 08-Sep-1975   HPI Patient is a 45 year old white male who was referred to my care by Dr. Abbey Chatters of gastroenterology for evaluation treatment of recurrent colon carcinoma at his anastomosis.  He has a known stage III colon cancer for which he underwent a low anterior resection by myself in April 2021 for recurrent carcinoma.  At that time, the margins were clear and 3 out of 6 lymph nodes were positive for cancer.  He has been undergoing chemotherapy through Dr. Delton Coombes of [facility] oncology.  He recently underwent a follow-up colonoscopy and was found to have a biopsy-proven recurrence at the anastomosis.  He has not had any further work-up since that time.  Patient denies any abdominal pain or rectal bleeding. Past Medical History:  Diagnosis Date  . Diabetes mellitus without complication ([place])   . Family history of breast cancer   . GERD (gastroesophageal reflux disease)   . History of kidney stones   . Hypercholesterolemia   . Hypertension   . Urethral stricture     Past Surgical History:  Procedure Laterality Date  . BALLOON DILATION  04/06/2012   Procedure: BALLOON DILATION;  Surgeon: Reece Packer, MD;  Location: [facility];  Service: Urology;  Laterality: N/A;  . BIOPSY  02/24/2018   Procedure: BIOPSY;  Surgeon: Danie Binder, MD;  [facility];  Service: Endoscopy;;  colon  . BIOPSY  06/01/2019   Procedure: BIOPSY;  Surgeon: Danie Binder, MD;  [facility];  Service: Endoscopy;;  . BIOPSY  05/15/2020   Procedure: BIOPSY;  Surgeon: Eloise Harman, DO;  Location: [facility];  Service: Endoscopy;;  . BOWEL RESECTION N/A 07/28/2019   Procedure: LOW ANTERIOR BOWEL RESECTION;  Surgeon: Aviva Signs, MD;  Location: [facility];  Service: General;  Laterality: N/A;  . COLONOSCOPY WITH PROPOFOL N/A 02/24/2018   Dr. Oneida Alar: fungating, infiltrative partially obstructing mass in sigmoid colon, area tattooed.  ext/int hemorrhoids. Bx: adenocarcinoma  . COLONOSCOPY WITH PROPOFOL N/A 06/01/2019   Polypoid lesion at surgical anastomosis, s/p biopsy. Consistent with adenocarcinoma.   . COLONOSCOPY WITH PROPOFOL N/A 05/15/2020   Procedure: COLONOSCOPY WITH PROPOFOL;  Surgeon: Eloise Harman, DO;  Location: [facility];  Service: Endoscopy;  Laterality: N/A;  9:00am  . CYSTOSCOPY WITH HOLMIUM LASER LITHOTRIPSY  08/07/2019   Procedure: CYSTOSCOPY WITH HOLMIUM LASER LITHOTRIPSY;  Surgeon: Festus Aloe, MD;  Location[facility];  Service: Urology;;  . CYSTOSCOPY/RETROGRADE/URETEROSCOPY/STONE EXTRACTION WITH BASKET Left 08/07/2019   Procedure: CYSTOSCOPY/RETROGRADE/URETEROSCOPY/STONE EXTRACTION WITH BASKET;  Surgeon: Festus Aloe, MD;  Location: [facility];  Service: Urology;  Laterality: Left;  . ESOPHAGOGASTRODUODENOSCOPY (EGD) WITH PROPOFOL N/A 08/29/2015   Dr. Oneida Alar: possible proximal esophageal web s/p dilation, moderate gastritis, negative eosinophilic esophagitis   . ESOPHAGOGASTRODUODENOSCOPY (EGD) WITH PROPOFOL  06/01/2019   normal esophagus ,normal stomach, normal duodenum.   Marland Kitchen PARTIAL COLECTOMY N/A 03/02/2018   Procedure: PARTIAL COLECTOMY;  Surgeon: Aviva Signs, MD;  Location: [facility];  Service: General;  Laterality: N/A;  . PORTACATH PLACEMENT Left 04/13/2018   Procedure: INSERTION PORT-A-CATH;  Surgeon: Aviva Signs, MD;  Location: [facility];  Service: General;  Laterality: Left;  . SAVORY DILATION N/A 08/29/2015   Procedure: SAVORY DILATION;  Surgeon: Danie Binder, MD;  [facility];  Service: Endoscopy;  Laterality: N/A;  . SUBMUCOSAL TATTOO INJECTION  05/15/2020   Procedure: SUBMUCOSAL TATTOO INJECTION;  Surgeon: Eloise Harman, DO;  Location: [facility];  Service: Endoscopy;;  .  URETHRAL DILATION    . WISDOM TOOTH EXTRACTION      Family History  Problem Relation Age of Onset  . Dementia Mother   . Dementia Maternal Aunt   . Dementia Maternal Grandmother   . COPD Maternal  Grandfather   . Breast cancer Maternal Aunt 42  . Breast cancer Maternal Aunt        dx in her 2s  . Lung cancer Maternal Aunt   . Healthy Son   . Colon cancer Neg Hx     Current Outpatient Medications on File Prior to Visit  Medication Sig Dispense Refill  . CONTOUR NEXT TEST test strip USE TO CHECK BLOOD SUGAR BID    . glipiZIDE (GLUCOTROL XL) 5 MG 24 hr tablet Take two tablets daily until you see Dr. Hilma Favors (Patient taking differently: 10 mg in the morning and at bedtime.) 30 tablet 3  . losartan (COZAAR) 100 MG tablet Take 100 mg by mouth daily.    . magnesium oxide (MAG-OX) 400 MG tablet Take 400 mg by mouth daily.    . metFORMIN (GLUCOPHAGE) 500 MG tablet Take 500 mg by mouth 2 (two) times daily with a meal.    . Multiple Vitamin (MULTIVITAMIN) tablet Take 1 tablet by mouth daily.     . Potassium 99 MG TABS Take 99 mg by mouth daily.    . pravastatin (PRAVACHOL) 20 MG tablet Take 20 mg by mouth daily.   1  . topiramate (TOPAMAX) 100 MG tablet Take 100 mg by mouth daily.      No current facility-administered medications on file prior to visit.    No Known Allergies  Social History   Substance and Sexual Activity  Alcohol Use Yes  . Alcohol/week: 6.0 standard drinks  . Types: 6 Cans of beer per week    Social History   Tobacco Use  Smoking Status Former Smoker  . Packs/day: 0.25  . Years: 9.00  . Pack years: 2.25  . Types: Cigarettes  . Quit date: 07/27/2011  . Years since quitting: 8.8  Smokeless Tobacco Never Used    Review of Systems  Constitutional: Negative.   HENT: Negative.   Respiratory: Negative.   Cardiovascular: Negative.   Gastrointestinal: Negative for abdominal pain, blood in stool, constipation, diarrhea, nausea and vomiting.  Endo/Heme/Allergies: Negative.   Psychiatric/Behavioral: Negative.   All other systems reviewed and are negative.   Objective   Vitals:   05/25/20 0849  BP: 133/88  Pulse: 88  Resp: 14  Temp: (!) 97.4 F (36.3  C)  SpO2: 97%    Physical Exam Vitals reviewed.  Constitutional:      Appearance: Normal appearance. He is not ill-appearing.  HENT:     Head: Normocephalic and atraumatic.  Cardiovascular:     Rate and Rhythm: Normal rate and regular rhythm.     Heart sounds: Normal heart sounds. No murmur heard. No friction rub. No gallop.   Pulmonary:     Effort: Pulmonary effort is normal. No respiratory distress.     Breath sounds: Normal breath sounds. No stridor. No wheezing, rhonchi or rales.  Abdominal:     General: Bowel sounds are normal. There is no distension.     Palpations: Abdomen is soft. There is no mass.     Tenderness: There is no abdominal tenderness. There is no guarding or rebound.     Hernia: No hernia is present.  Skin:    General: Skin is warm and dry.  Neurological:  Mental Status: He is alert and oriented to person, place, and time.   Dr. Ave Filter colonoscopy report and pathology results reviewed Dr. Tomie China recent outpatient visit reviewed  Assessment  Recurrent colon carcinoma at the anastomosis. Plan   I discussed with the patient that he will need further work-up including CT scan of the chest, abdomen, and pelvis as well as a possible follow-up PET scan.  This metastatic work-up will be needed in order to evaluate the further need for surgical intervention.  I told him that reoperating to resect the anastomosis would be more challenging and may require a colostomy.  He would like a second opinion concerning his options which I totally support.  I have made arrangements for him to see Dr. Jyl Heinz at [facility] for further evaluation.  We will send all needed records and will help manage any further work-up as needed.

## 2020-07-17 ENCOUNTER — Other Ambulatory Visit (HOSPITAL_COMMUNITY): Payer: No Typology Code available for payment source

## 2020-07-20 ENCOUNTER — Ambulatory Visit (HOSPITAL_COMMUNITY): Payer: No Typology Code available for payment source

## 2020-07-25 ENCOUNTER — Ambulatory Visit (HOSPITAL_COMMUNITY): Payer: No Typology Code available for payment source | Admitting: Hematology

## 2020-07-26 ENCOUNTER — Ambulatory Visit (HOSPITAL_COMMUNITY): Payer: No Typology Code available for payment source | Admitting: Hematology

## 2020-11-16 ENCOUNTER — Emergency Department (HOSPITAL_COMMUNITY)
Admission: EM | Admit: 2020-11-16 | Discharge: 2020-11-16 | Disposition: A | Discharge status: Other Acute Inpatient Hospital | Payer: No Typology Code available for payment source | Attending: Emergency Medicine | Admitting: Emergency Medicine

## 2020-11-16 ENCOUNTER — Encounter (HOSPITAL_COMMUNITY): Payer: Self-pay | Admitting: *Deleted

## 2020-11-16 ENCOUNTER — Emergency Department (HOSPITAL_COMMUNITY): Payer: No Typology Code available for payment source

## 2020-11-16 ENCOUNTER — Other Ambulatory Visit: Payer: Self-pay

## 2020-11-16 DIAGNOSIS — R42 Dizziness and giddiness: Secondary | ICD-10-CM | POA: Diagnosis not present

## 2020-11-16 DIAGNOSIS — E119 Type 2 diabetes mellitus without complications: Secondary | ICD-10-CM | POA: Insufficient documentation

## 2020-11-16 DIAGNOSIS — Z7984 Long term (current) use of oral hypoglycemic drugs: Secondary | ICD-10-CM | POA: Insufficient documentation

## 2020-11-16 DIAGNOSIS — Z79899 Other long term (current) drug therapy: Secondary | ICD-10-CM | POA: Diagnosis not present

## 2020-11-16 DIAGNOSIS — R61 Generalized hyperhidrosis: Secondary | ICD-10-CM | POA: Insufficient documentation

## 2020-11-16 DIAGNOSIS — R109 Unspecified abdominal pain: Secondary | ICD-10-CM | POA: Diagnosis present

## 2020-11-16 DIAGNOSIS — Z20822 Contact with and (suspected) exposure to covid-19: Secondary | ICD-10-CM | POA: Insufficient documentation

## 2020-11-16 DIAGNOSIS — Z87891 Personal history of nicotine dependence: Secondary | ICD-10-CM | POA: Insufficient documentation

## 2020-11-16 DIAGNOSIS — Z85048 Personal history of other malignant neoplasm of rectum, rectosigmoid junction, and anus: Secondary | ICD-10-CM | POA: Insufficient documentation

## 2020-11-16 DIAGNOSIS — R1084 Generalized abdominal pain: Secondary | ICD-10-CM

## 2020-11-16 DIAGNOSIS — Z85038 Personal history of other malignant neoplasm of large intestine: Secondary | ICD-10-CM | POA: Insufficient documentation

## 2020-11-16 DIAGNOSIS — I10 Essential (primary) hypertension: Secondary | ICD-10-CM | POA: Diagnosis not present

## 2020-11-16 LAB — COMPREHENSIVE METABOLIC PANEL
ALT: 176 U/L — ABNORMAL HIGH (ref 0–44)
AST: 88 U/L — ABNORMAL HIGH (ref 15–41)
Albumin: 3.4 g/dL — ABNORMAL LOW (ref 3.5–5.0)
Alkaline Phosphatase: 115 U/L (ref 38–126)
Anion gap: 11 (ref 5–15)
BUN: 20 mg/dL (ref 6–20)
CO2: 23 mmol/L (ref 22–32)
Calcium: 8.5 mg/dL — ABNORMAL LOW (ref 8.9–10.3)
Chloride: 97 mmol/L — ABNORMAL LOW (ref 98–111)
Creatinine, Ser: 0.97 mg/dL (ref 0.61–1.24)
GFR, Estimated: >60 mL/min (ref >60)
Glucose, Bld: 166 mg/dL — ABNORMAL HIGH (ref 70–99)
Potassium: 3.2 mmol/L — ABNORMAL LOW (ref 3.5–5.1)
Sodium: 131 mmol/L — ABNORMAL LOW (ref 135–145)
Total Bilirubin: 0.7 mg/dL (ref 0.3–1.2)
Total Protein: 6.1 g/dL — ABNORMAL LOW (ref 6.5–8.1)

## 2020-11-16 LAB — CBC WITH DIFFERENTIAL/PLATELET
Abs Immature Granulocytes: 0.07 10*3/uL (ref 0.00–0.07)
Basophils Absolute: 0 10*3/uL (ref 0.0–0.1)
Basophils Relative: 0 %
Eosinophils Absolute: 0.1 10*3/uL (ref 0.0–0.5)
Eosinophils Relative: 1 %
HCT: 39.5 % (ref 39.0–52.0)
Hemoglobin: 13.8 g/dL (ref 13.0–17.0)
Immature Granulocytes: 1 %
Lymphocytes Relative: 6 %
Lymphs Abs: 0.4 10*3/uL — ABNORMAL LOW (ref 0.7–4.0)
MCH: 31.2 pg (ref 26.0–34.0)
MCHC: 34.9 g/dL (ref 30.0–36.0)
MCV: 89.2 fL (ref 80.0–100.0)
Monocytes Absolute: 0.5 10*3/uL (ref 0.1–1.0)
Monocytes Relative: 7 %
Neutro Abs: 5.5 10*3/uL (ref 1.7–7.7)
Neutrophils Relative %: 85 %
Platelets: 291 10*3/uL (ref 150–400)
RBC: 4.43 MIL/uL (ref 4.22–5.81)
RDW: 12.6 % (ref 11.5–15.5)
WBC: 6.5 10*3/uL (ref 4.0–10.5)
nRBC: 0.3 % — ABNORMAL HIGH (ref 0.0–0.2)

## 2020-11-16 LAB — URINALYSIS, ROUTINE W REFLEX MICROSCOPIC
Bacteria, UA: NONE SEEN
Bilirubin Urine: NEGATIVE
Glucose, UA: >=500 mg/dL — AB
Hgb urine dipstick: NEGATIVE
Ketones, ur: 20 mg/dL — AB
Leukocytes,Ua: NEGATIVE
Nitrite: NEGATIVE
Protein, ur: NEGATIVE mg/dL
Specific Gravity, Urine: >1.046 — ABNORMAL HIGH (ref 1.005–1.030)
pH: 7 (ref 5.0–8.0)

## 2020-11-16 LAB — LACTIC ACID, PLASMA: Lactic Acid, Venous: 0.9 mmol/L (ref 0.5–1.9)

## 2020-11-16 LAB — RESP PANEL BY RT-PCR (FLU A&B, COVID) ARPGX2
Influenza A by PCR: NEGATIVE
Influenza B by PCR: NEGATIVE
SARS Coronavirus 2 by RT PCR: NEGATIVE

## 2020-11-16 LAB — LIPASE, BLOOD: Lipase: 25 U/L (ref 11–51)

## 2020-11-16 MED ORDER — SODIUM CHLORIDE 0.9 % IV BOLUS
1000.0000 mL | Freq: Once | INTRAVENOUS | Status: AC
  Administered 2020-11-16: 1000 mL via INTRAVENOUS

## 2020-11-16 MED ORDER — IOHEXOL 350 MG/ML SOLN
80.0000 mL | Freq: Once | INTRAVENOUS | Status: AC | PRN
  Administered 2020-11-16: 80 mL via INTRAVENOUS

## 2020-11-16 MED ORDER — OXYCODONE HCL 5 MG PO TABS
10.0000 mg | ORAL_TABLET | Freq: Once | ORAL | Status: AC
  Administered 2020-11-16: 10 mg via ORAL
  Filled 2020-11-16: qty 2

## 2020-11-16 MED ORDER — POTASSIUM CHLORIDE CRYS ER 20 MEQ PO TBCR
40.0000 meq | EXTENDED_RELEASE_TABLET | Freq: Once | ORAL | Status: AC
  Administered 2020-11-16: 40 meq via ORAL
  Filled 2020-11-16: qty 2

## 2020-11-16 MED ORDER — FENTANYL CITRATE PF 50 MCG/ML IJ SOSY
50.0000 ug | PREFILLED_SYRINGE | Freq: Once | INTRAMUSCULAR | Status: AC
  Administered 2020-11-16: 50 ug via INTRAVENOUS
  Filled 2020-11-16: qty 1

## 2020-11-16 NOTE — ED Notes (Signed)
Pt has assigned bed at [facility]  Report can be called to [phone]. Charge Nurse is available at [phone].  ETA 1 hour, will call prior to arrival

## 2020-11-16 NOTE — ED Notes (Signed)
[facility] called back to inform this nurse pts bed assignment has been changed. Pt bed was changed to [facility] Room C711 report can be called to [phone] report. Charge RN can be reached at [phone]

## 2020-11-16 NOTE — ED Triage Notes (Signed)
States EMS came out prior to arrival and gave him an IV fulid bolus

## 2020-11-16 NOTE — ED Notes (Signed)
[facility] reviewed by Charge Nurse Baldo Ash RN. Pt being transported by [facility] at this time.

## 2020-11-16 NOTE — ED Notes (Signed)
Pt currently refusing to sign consent for transfer. Wishes to be taken by POV. Pt instructed he would lse bed assignment. Pt states he would rather go home. PA informed.

## 2020-11-16 NOTE — ED Notes (Signed)
Electronic consent for transfer obtained

## 2020-11-16 NOTE — ED Triage Notes (Signed)
C/o dizziness with low blood pressure, released form [facility] yesterday after colorectal surgery

## 2020-11-16 NOTE — ED Provider Notes (Signed)
[facility] EMERGENCY DEPARTMENT Provider Note   CSN: [id number] Arrival date & time: 11/16/20  1142     History Chief Complaint  Patient presents with   Abdominal Pain    Joel Jefferson. is a 45 y.o. male.   Abdominal Pain Associated symptoms: no chest pain, no chills, no cough, no dysuria, no fatigue, no fever, no hematuria, no nausea, no shortness of breath, no sore throat and no vomiting       Joel Jefferson. is a 45 y.o. male with past medical history of type 2 diabetes, GERD, hypertension, and colon and rectal cancer stage III with partial colectomy 2019 and recurrence of adenocarcinoma in 2021.  He presents to the Emergency Department complaining of dizziness, diaphoresis, and low blood pressure.  Patient was recently discharged from [facility] status post laparoscopic surgery colectomy with anastomosis and anterior resection of the rectum.  Procedure was performed on 11/09/2020 currently has an ileostomy.  He states he was not being given his antihypertensive medication during his hospital stay, patient reports systolic pressure of [id number] at time of discharge.  When he returned home he took his routine antihypertensive medication this morning around 630.  Within less than 1 hour he states that his blood pressure dropped to 60 systolic and he was feeling dizzy upon standing.  EMS was contacted and patient was given 1 L IV saline after recheck of vitals, blood pressure improved to [id number] systolic.  Patient reports some sensation of fullness to his abdomen, reports liquid stool in the ileostomy bag.  Normal flatus.  Denies decreased appetite, fever, chills, and vomiting.  He also denies chest pain and shortness of breath   Past Medical History:  Diagnosis Date   Diabetes mellitus without complication ([place])    Family history of breast cancer    GERD (gastroesophageal reflux disease)    History of kidney stones    Hypercholesterolemia    Hypertension    Urethral  stricture     Patient Active Problem List   Diagnosis Date Noted   Malignant neoplasm of descending colon ([place])    Colon cancer high risk    Fatty liver 02/15/2019   Personal history of colon cancer, stage III 02/15/2019   Genetic testing 04/02/2018   Family history of breast cancer    S/P partial colectomy 03/02/2018   Malignant neoplasm of sigmoid colon ([place])    Rectal bleeding 02/17/2018   LLQ pain 02/17/2018   Abnormal computed tomography of sigmoid colon 02/17/2018   Constipation 02/17/2018   Dysphagia 10/13/2014   GERD (gastroesophageal reflux disease) 10/13/2014    Past Surgical History:  Procedure Laterality Date   BALLOON DILATION  04/06/2012   Procedure: BALLOON DILATION;  Surgeon: Reece Packer, MD;  Location: [facility];  Service: Urology;  Laterality: N/A;   BIOPSY  02/24/2018   Procedure: BIOPSY;  Surgeon: Danie Binder, MD;  [facility];  Service: Endoscopy;;  colon   BIOPSY  06/01/2019   Procedure: BIOPSY;  Surgeon: Danie Binder, MD;  Location: [facility];  Service: Endoscopy;;   BIOPSY  05/15/2020   Procedure: BIOPSY;  Surgeon: Eloise Harman, DO;  Location: [facility];  Service: Endoscopy;;   BOWEL RESECTION N/A 07/28/2019   Procedure: LOW ANTERIOR BOWEL RESECTION;  Surgeon: Aviva Signs, MD;  Location: [facility];  Service: General;  Laterality: N/A;   COLONOSCOPY WITH PROPOFOL N/A 02/24/2018   Dr. Oneida Alar: fungating, infiltrative partially obstructing mass in  sigmoid colon, area tattooed. ext/int hemorrhoids. Bx: adenocarcinoma   COLONOSCOPY WITH PROPOFOL N/A 06/01/2019   Polypoid lesion at surgical anastomosis, s/p biopsy. Consistent with adenocarcinoma.    COLONOSCOPY WITH PROPOFOL N/A 05/15/2020   Procedure: COLONOSCOPY WITH PROPOFOL;  Surgeon: Eloise Harman, DO;  Location: [facility];  Service: Endoscopy;  Laterality: N/A;  9:00am   CYSTOSCOPY WITH HOLMIUM LASER LITHOTRIPSY  08/07/2019   Procedure: CYSTOSCOPY WITH  HOLMIUM LASER LITHOTRIPSY;  Surgeon: Festus Aloe, MD;  Location: [facility];  Service: Urology;;   CYSTOSCOPY/RETROGRADE/URETEROSCOPY/STONE EXTRACTION WITH BASKET Left 08/07/2019   Procedure: CYSTOSCOPY/RETROGRADE/URETEROSCOPY/STONE EXTRACTION WITH BASKET;  Surgeon: Festus Aloe, MD;  Location: [facility];  Service: Urology;  Laterality: Left;   ESOPHAGOGASTRODUODENOSCOPY (EGD) WITH PROPOFOL N/A 08/29/2015   Dr. Oneida Alar: possible proximal esophageal web s/p dilation, moderate gastritis, negative eosinophilic esophagitis    ESOPHAGOGASTRODUODENOSCOPY (EGD) WITH PROPOFOL  06/01/2019   normal esophagus ,normal stomach, normal duodenum.    PARTIAL COLECTOMY N/A 03/02/2018   Procedure: PARTIAL COLECTOMY;  Surgeon: Aviva Signs, MD;  Location: [facility];  Service: General;  Laterality: N/A;   PORTACATH PLACEMENT Left 04/13/2018   Procedure: INSERTION PORT-A-CATH;  Surgeon: Aviva Signs, MD;  Location: [facility];  Service: General;  Laterality: Left;   SAVORY DILATION N/A 08/29/2015   Procedure: SAVORY DILATION;  Surgeon: Danie Binder, MD;  [facility];  Service: Endoscopy;  Laterality: N/A;   SUBMUCOSAL TATTOO INJECTION  05/15/2020   Procedure: SUBMUCOSAL TATTOO INJECTION;  Surgeon: Eloise Harman, DO;  Location: [facility];  Service: Endoscopy;;   URETHRAL DILATION     WISDOM TOOTH EXTRACTION         Family History  Problem Relation Age of Onset   Dementia Mother    Dementia Maternal Aunt    Dementia Maternal Grandmother    COPD Maternal Grandfather    Breast cancer Maternal Aunt 42   Breast cancer Maternal Aunt        dx in her 73s   Lung cancer Maternal Aunt    Healthy Son    Colon cancer Neg Hx     Social History   Tobacco Use   Smoking status: Former    Packs/day: 0.25    Years: 9.00    Pack years: 2.25    Types: Cigarettes    Quit date: 07/27/2011    Years since quitting: 9.3   Smokeless tobacco: Never  Vaping Use   Vaping Use: Never used  Substance Use Topics    Alcohol use: Yes    Alcohol/week: 6.0 standard drinks    Types: 6 Cans of beer per week   Drug use: No    Home Medications Prior to Admission medications   Medication Sig Start Date End Date Taking? Authorizing Provider  CONTOUR NEXT TEST test strip USE TO CHECK BLOOD SUGAR BID 08/25/18   [provider]  glipiZIDE (GLUCOTROL XL) 5 MG 24 hr tablet Take two tablets daily until you see Dr. Hilma Favors Patient taking differently: 10 mg in the morning and at bedtime. 07/31/19   Aviva Signs, MD  losartan (COZAAR) 100 MG tablet Take 100 mg by mouth daily. 04/28/19   [provider]  magnesium oxide (MAG-OX) 400 MG tablet Take 400 mg by mouth daily.    [provider]  metFORMIN (GLUCOPHAGE) 500 MG tablet Take 500 mg by mouth 2 (two) times daily with a meal.    [provider]  Multiple Vitamin (MULTIVITAMIN) tablet Take 1 tablet by mouth daily.  [provider]  Potassium 99 MG TABS Take 99 mg by mouth daily.    [provider]  pravastatin (PRAVACHOL) 20 MG tablet Take 20 mg by mouth daily.  08/30/14   [provider]  topiramate (TOPAMAX) 100 MG tablet Take 100 mg by mouth daily.     [provider]    Allergies    Patient has no known allergies.  Review of Systems   Review of Systems  Constitutional:  Negative for chills, fatigue and fever.  HENT:  Negative for sore throat and trouble swallowing.   Respiratory:  Negative for cough, shortness of breath and wheezing.   Cardiovascular:  Negative for chest pain and palpitations.  Gastrointestinal:  Positive for abdominal pain. Negative for blood in stool, nausea and vomiting.  Genitourinary:  Negative for dysuria, flank pain and hematuria.  Musculoskeletal:  Negative for arthralgias, back pain, myalgias, neck pain and neck stiffness.  Skin:  Negative for rash.  Neurological:  Positive for dizziness. Negative for syncope, speech difficulty, weakness (generalized  weakness), numbness and headaches.  Hematological:  Does not bruise/bleed easily.   Physical Exam Updated Vital Signs BP 125/70 (BP Location: Right Arm)   Pulse 66   Temp 98.3 F (36.8 C) (Oral)   Resp 18   SpO2 98%   Physical Exam Vitals and nursing note reviewed.  Constitutional:      General: He is not in acute distress.    Appearance: He is well-developed. He is not toxic-appearing.  HENT:     Mouth/Throat:     Mouth: Mucous membranes are moist.  Cardiovascular:     Rate and Rhythm: Normal rate and regular rhythm.     Pulses: Normal pulses.  Pulmonary:     Effort: Pulmonary effort is normal. No respiratory distress.     Breath sounds: Normal breath sounds.  Abdominal:     General: Bowel sounds are normal.     Palpations: Abdomen is soft. There is no mass.     Tenderness: There is generalized abdominal tenderness.     Comments: Pt with right sided ileostomy in place.  Brown liquid stool in the bag.  Two laparoscopic scars to upper abdomen.  No surrounding edema or erythema.  Diffuse tenderness.  Bowel sounds present x 4.  No guarding or rebound, abd soft.    Musculoskeletal:     Right lower leg: No edema.     Left lower leg: No edema.  Skin:    General: Skin is warm.     Capillary Refill: Capillary refill takes less than 2 seconds.  Neurological:     General: No focal deficit present.     Mental Status: He is alert.     Sensory: No sensory deficit.     Motor: No weakness.    ED Results / Procedures / Treatments   Labs (all labs ordered are listed, but only abnormal results are displayed) Labs Reviewed  CBC WITH DIFFERENTIAL/PLATELET - Abnormal; Notable for the following components:      Result Value   nRBC 0.3 (*)    Lymphs Abs 0.4 (*)    All other components within normal limits  COMPREHENSIVE METABOLIC PANEL - Abnormal; Notable for the following components:   Sodium 131 (*)    Potassium 3.2 (*)    Chloride 97 (*)    Glucose, Bld 166 (*)    Calcium 8.5  (*)    Total Protein 6.1 (*)    Albumin 3.4 (*)    AST  88 (*)    ALT 176 (*)    All other components within normal limits  LIPASE, BLOOD  LACTIC ACID, PLASMA  URINALYSIS, ROUTINE W REFLEX MICROSCOPIC    EKG None  Radiology CT ABDOMEN PELVIS W CONTRAST  Result Date: 11/16/2020 CLINICAL DATA:  Abdominal pain ileostomy EXAM: CT ABDOMEN AND PELVIS WITH CONTRAST TECHNIQUE: Multidetector CT imaging of the abdomen and pelvis was performed using the standard protocol following bolus administration of intravenous contrast. CONTRAST:  64m OMNIPAQUE IOHEXOL 350 MG/ML SOLN COMPARISON:  CT 01/10/2020 FINDINGS: Lower chest: Lung bases demonstrate subsegmental atelectasis in the lower lobes. No pleural effusion. Normal cardiac size. Mild circumferential thickening of the distal esophagus. Hepatobiliary: No focal liver abnormality is seen. No gallstones, gallbladder wall thickening, or biliary dilatation. Pancreas: Unremarkable. No pancreatic ductal dilatation or surrounding inflammatory changes. Spleen: Normal in size without focal abnormality. Adrenals/Urinary Tract: Adrenal glands are unremarkable. Kidneys are normal, without renal calculi, focal lesion, or hydronephrosis. Bladder is unremarkable. Stomach/Bowel: Mild fluid distension of the stomach. Multiple dilated fluid-filled loops of small bowel measuring up to 4.4 cm. Interval right lower quadrant loop ileostomy. Small bowel is dilated up to the ileostomy. Exiting small bowel is completely decompressed as is the terminal ileum and colon. Post left colectomy changes with suture line in the low pelvis. Perirectal edema and soft tissue changes presumably related to recent surgery. Vascular/Lymphatic: Aorta is nonaneurysmal.  No suspicious nodes Reproductive: Prostate is unremarkable. Other: No free air. Slight hazy infiltrated appearance of left abdominal mesentery with evidence of 7 mm left upper quadrant nodule, series 2, image 32 small free fluid in the  left gutter. Punctate nodule in the left mid abdomen, series 2, image 51. Musculoskeletal: No acute or suspicious abnormality is seen IMPRESSION: 1. Status post distal left hemicolectomy and interval right lower quadrant ileostomy. Multiple fluid-filled dilated loops of small bowel to the level of the right abdominal ileostomy with completely decompressed exiting small bowel, terminal ileum and colon suggestive of mild obstruction. Edema and soft tissue stranding in the perirectal fat presumably due to recent surgery. 2. Mild hazy infiltrated appearance of left abdominal mesentery with small left upper quadrant 7 mm peritoneal nodule and punctate left mid abdominal mesenteric nodule. While some of the findings could be secondary to recent postsurgical status, close attention on follow-up CT recommended to exclude peritoneal disease. Electronically Signed   By: KDonavan FoilM.D.   On: 11/16/2020 17:53    Procedures Procedures   Medications Ordered in ED Medications  sodium chloride 0.9 % bolus 1,000 mL (1,000 mLs Intravenous New Bag/Given 11/16/20 1312)    ED Course  I have reviewed the triage vital signs and the nursing notes.  Pertinent labs & imaging results that were available during my care of the patient were reviewed by me and considered in my medical decision making (see chart for details).   Orthostatic VS for the past 24 hrs (Last 3 readings):  BP- Lying Pulse- Lying BP- Sitting Pulse- Sitting BP- Standing at 0 minutes Pulse- Standing at 0 minutes  11/16/20 1726 120/77 111 110/89 129 110/75 136  11/16/20 1420 110/69 107 96/66 125 93/67 131    CRITICAL CARE Performed by: Jarrah Babich Total critical care time: 40 minutes Critical care time was exclusive of separately billable procedures and treating other patients. Critical care was necessary to treat or prevent imminent or life-threatening deterioration. Critical care was time spent personally by me on the following activities:  development of treatment plan with patient and/or  surrogate as well as nursing, discussions with consultants, evaluation of patient's response to treatment, examination of patient, obtaining history from patient or surrogate, ordering and performing treatments and interventions, ordering and review of laboratory studies, ordering and review of radiographic studies, pulse oximetry and re-evaluation of patient's condition.     MDM Rules/Calculators/A&P                           Patient here for days postop for ileostomy with history of adenocarcinoma of the descending colon and rectal cancer.  Surgery performed at [facility], by Dr. Judi Cong. Reports that he became dizzy and diaphoretic this morning shortly after taking his antihypertensive medication.  States his blood pressure dropped to 60 systolic.  EMS was contacted and patient was given 1 L of normal saline by EMS.  Blood pressure improved to greater than [id number] systolic.  On arrival here blood pressure 0000000 systolic.  On repeat orthostatics, patient pressure dropped upon standing to 93.  Additional IV fluids ordered.  Will check baseline labs.  Patient also complaining of abdominal pain.  Will get CT.  Patient otherwise well-appearing nontoxic.  Low clinical suspicion for sepsis.  No chest pain or shortness of breath to suggest PE.  Interpreted by me, lactic acid unremarkable.  Lipase also unremarkable.  Chemistries show mild hypokalemia at 3.2 BUN/creatinine unremarkable AST 88 ALT 176, total bilirubin within normal limits.  No leukocytosis or anemia. Patient reports within 12-hour span last evening that he has had approximately 800 cc output of stool. also notes excessive gas within his ileostomy bag and bloating of his abdomen. Bag emptied here multiple times as well. No vomiting.  CT of the abdomen pelvis shows hazy infiltrated appearance of the left abdominal mesentery and multiple fluid-filled dilated loops of small bowel to the level of the right  ileostomy that may represent bowel obstruction.  Patient remains mildly tachycardic, otherwise hemodynamically stable.  Will consult patient's surgeon at [facility].    [phone] discussed findings with colorectal surgery, Dr. Frampton Mech at [facility] who recommends patient be transferred to [facility] for further care.  Dr. Carlis Abbott is the accepting physician.  Final Clinical Impression(s) / ED Diagnoses Final diagnoses:  Generalized abdominal pain    Rx / DC Orders ED Discharge Orders     None        Bufford Lope 11/16/20 2047    Milton Ferguson, MD 11/19/20 [phone]

## 2020-11-25 ENCOUNTER — Other Ambulatory Visit: Payer: Self-pay

## 2020-11-25 ENCOUNTER — Encounter (HOSPITAL_COMMUNITY): Payer: Self-pay

## 2020-11-25 ENCOUNTER — Emergency Department (HOSPITAL_COMMUNITY)
Admission: EM | Admit: 2020-11-25 | Discharge: 2020-11-25 | Disposition: A | Discharge status: Home / Self Care | Payer: No Typology Code available for payment source | Attending: Student | Admitting: Student

## 2020-11-25 DIAGNOSIS — R197 Diarrhea, unspecified: Secondary | ICD-10-CM | POA: Diagnosis not present

## 2020-11-25 DIAGNOSIS — Z20822 Contact with and (suspected) exposure to covid-19: Secondary | ICD-10-CM | POA: Diagnosis not present

## 2020-11-25 DIAGNOSIS — R1084 Generalized abdominal pain: Secondary | ICD-10-CM | POA: Insufficient documentation

## 2020-11-25 DIAGNOSIS — E876 Hypokalemia: Secondary | ICD-10-CM | POA: Diagnosis not present

## 2020-11-25 DIAGNOSIS — Z87891 Personal history of nicotine dependence: Secondary | ICD-10-CM | POA: Diagnosis not present

## 2020-11-25 DIAGNOSIS — E119 Type 2 diabetes mellitus without complications: Secondary | ICD-10-CM | POA: Insufficient documentation

## 2020-11-25 DIAGNOSIS — K9409 Other complications of colostomy: Secondary | ICD-10-CM | POA: Diagnosis not present

## 2020-11-25 DIAGNOSIS — Z7984 Long term (current) use of oral hypoglycemic drugs: Secondary | ICD-10-CM | POA: Diagnosis not present

## 2020-11-25 DIAGNOSIS — Z79899 Other long term (current) drug therapy: Secondary | ICD-10-CM | POA: Insufficient documentation

## 2020-11-25 DIAGNOSIS — I10 Essential (primary) hypertension: Secondary | ICD-10-CM | POA: Insufficient documentation

## 2020-11-25 DIAGNOSIS — Z85038 Personal history of other malignant neoplasm of large intestine: Secondary | ICD-10-CM | POA: Insufficient documentation

## 2020-11-25 LAB — CBC WITH DIFFERENTIAL/PLATELET
Abs Immature Granulocytes: 0.05 10*3/uL (ref 0.00–0.07)
Basophils Absolute: 0.1 10*3/uL (ref 0.0–0.1)
Basophils Relative: 1 %
Eosinophils Absolute: 0 10*3/uL (ref 0.0–0.5)
Eosinophils Relative: 0 %
HCT: 44.3 % (ref 39.0–52.0)
Hemoglobin: 15.6 g/dL (ref 13.0–17.0)
Immature Granulocytes: 1 %
Lymphocytes Relative: 6 %
Lymphs Abs: 0.6 10*3/uL — ABNORMAL LOW (ref 0.7–4.0)
MCH: 30.4 pg (ref 26.0–34.0)
MCHC: 35.2 g/dL (ref 30.0–36.0)
MCV: 86.2 fL (ref 80.0–100.0)
Monocytes Absolute: 0.7 10*3/uL (ref 0.1–1.0)
Monocytes Relative: 7 %
Neutro Abs: 8.8 10*3/uL — ABNORMAL HIGH (ref 1.7–7.7)
Neutrophils Relative %: 85 %
Platelets: 437 10*3/uL — ABNORMAL HIGH (ref 150–400)
RBC: 5.14 MIL/uL (ref 4.22–5.81)
RDW: 12.3 % (ref 11.5–15.5)
WBC: 10.2 10*3/uL (ref 4.0–10.5)
nRBC: 0 % (ref 0.0–0.2)

## 2020-11-25 LAB — RESP PANEL BY RT-PCR (FLU A&B, COVID) ARPGX2
Influenza A by PCR: NEGATIVE
Influenza B by PCR: NEGATIVE
SARS Coronavirus 2 by RT PCR: NEGATIVE

## 2020-11-25 LAB — COMPREHENSIVE METABOLIC PANEL
ALT: 62 U/L — ABNORMAL HIGH (ref 0–44)
AST: 29 U/L (ref 15–41)
Albumin: 3.9 g/dL (ref 3.5–5.0)
Alkaline Phosphatase: 83 U/L (ref 38–126)
Anion gap: 16 — ABNORMAL HIGH (ref 5–15)
BUN: 21 mg/dL — ABNORMAL HIGH (ref 6–20)
CO2: 28 mmol/L (ref 22–32)
Calcium: 9.4 mg/dL (ref 8.9–10.3)
Chloride: 87 mmol/L — ABNORMAL LOW (ref 98–111)
Creatinine, Ser: 1.12 mg/dL (ref 0.61–1.24)
GFR, Estimated: >60 mL/min (ref >60)
Glucose, Bld: 216 mg/dL — ABNORMAL HIGH (ref 70–99)
Potassium: 3 mmol/L — ABNORMAL LOW (ref 3.5–5.1)
Sodium: 131 mmol/L — ABNORMAL LOW (ref 135–145)
Total Bilirubin: 0.5 mg/dL (ref 0.3–1.2)
Total Protein: 6.9 g/dL (ref 6.5–8.1)

## 2020-11-25 LAB — C DIFFICILE QUICK SCREEN W PCR REFLEX
C Diff antigen: NEGATIVE
C Diff interpretation: NOT DETECTED
C Diff toxin: NEGATIVE

## 2020-11-25 LAB — MAGNESIUM: Magnesium: 2.3 mg/dL (ref 1.7–2.4)

## 2020-11-25 MED ORDER — HEPARIN SOD (PORK) LOCK FLUSH 100 UNIT/ML IV SOLN
500.0000 [IU] | Freq: Once | INTRAVENOUS | Status: AC
  Administered 2020-11-25: 500 [IU]

## 2020-11-25 MED ORDER — POTASSIUM CHLORIDE 10 MEQ/50ML IV SOLN: 10.0000 meq | INTRAVENOUS | Status: DC

## 2020-11-25 MED ORDER — POTASSIUM CHLORIDE 10 MEQ/100ML IV SOLN
10.0000 meq | INTRAVENOUS | Status: AC
  Administered 2020-11-25 (×3): 10 meq via INTRAVENOUS
  Filled 2020-11-25 (×2): qty 100

## 2020-11-25 MED ORDER — SODIUM CHLORIDE 0.9 % IV BOLUS
1000.0000 mL | Freq: Once | INTRAVENOUS | Status: AC
  Administered 2020-11-25: 1000 mL via INTRAVENOUS

## 2020-11-25 MED ORDER — LOPERAMIDE HCL 2 MG PO CAPS
4.0000 mg | ORAL_CAPSULE | Freq: Once | ORAL | Status: AC
  Administered 2020-11-25: 4 mg via ORAL
  Filled 2020-11-25: qty 2

## 2020-11-25 MED ORDER — DIPHENOXYLATE-ATROPINE 2.5-0.025 MG PO TABS
2.0000 | ORAL_TABLET | Freq: Once | ORAL | Status: AC
  Administered 2020-11-25: 2 via ORAL
  Filled 2020-11-25: qty 2

## 2020-11-25 MED ORDER — POTASSIUM CHLORIDE CRYS ER 20 MEQ PO TBCR
40.0000 meq | EXTENDED_RELEASE_TABLET | Freq: Once | ORAL | Status: AC
  Administered 2020-11-25: 40 meq via ORAL
  Filled 2020-11-25: qty 2

## 2020-11-25 MED ORDER — DIPHENOXYLATE-ATROPINE 2.5-0.025 MG PO TABS: 2.0000 | ORAL_TABLET | Freq: Four times a day (QID) | ORAL | 0 refills | Status: AC | PRN

## 2020-11-25 MED ORDER — POTASSIUM CHLORIDE 10 MEQ/100ML IV SOLN
10.0000 meq | INTRAVENOUS | Status: DC
  Filled 2020-11-25: qty 100

## 2020-11-25 NOTE — Discharge Instructions (Addendum)
Drink gatoraid. Eat a bananas to help replace potassium

## 2020-11-25 NOTE — ED Notes (Signed)
Peripheral IV access attempted x1 in left hand, infiltrated-removed.

## 2020-11-25 NOTE — ED Triage Notes (Signed)
Pt to er, pt states that he has an ostomy bag, states that he has had increased gas and output since yesterday, states that today he is fatigued and generally weak, states that he has tried to keep up eating and drinking, but feels that he might be dehydrated.

## 2020-11-25 NOTE — ED Provider Notes (Addendum)
[facility] EMERGENCY DEPARTMENT Provider Note   CSN: [id number] Arrival date & time: 11/25/20  [id number]     History Chief Complaint  Patient presents with   Abdominal Pain   Diarrhea    Joel Jefferson. is a 45 y.o. male.  Pt has a ostomy after partial bowel resection.  Pt reports he has had a large amount of output.  Family member reports dumping bag 3 times between 12 midnight and 7 am.  Bag has been empty ed 3 times since.  Pt reports he has taken imodium and metamucil and eaten crackers without slowing.  Family member reports pills are coming out whole.    The history is provided by the patient and a relative. No language interpreter was used.  Abdominal Pain Pain location:  Generalized Pain quality: aching   Pain radiates to:  Does not radiate Pain severity:  No pain Onset quality:  Gradual Duration:  1 day Timing:  Constant Progression:  Worsening Relieved by:  Nothing Worsened by:  Nothing Associated symptoms: diarrhea   Diarrhea Associated symptoms: abdominal pain       Past Medical History:  Diagnosis Date   Diabetes mellitus without complication ([place])    Family history of breast cancer    GERD (gastroesophageal reflux disease)    History of kidney stones    Hypercholesterolemia    Hypertension    Urethral stricture     Patient Active Problem List   Diagnosis Date Noted   Malignant neoplasm of descending colon ([place])    Colon cancer high risk    Fatty liver 02/15/2019   Personal history of colon cancer, stage III 02/15/2019   Genetic testing 04/02/2018   Family history of breast cancer    S/P partial colectomy 03/02/2018   Malignant neoplasm of sigmoid colon ([place])    Rectal bleeding 02/17/2018   LLQ pain 02/17/2018   Abnormal computed tomography of sigmoid colon 02/17/2018   Constipation 02/17/2018   Dysphagia 10/13/2014   GERD (gastroesophageal reflux disease) 10/13/2014    Past Surgical History:  Procedure Laterality Date   BALLOON  DILATION  04/06/2012   Procedure: BALLOON DILATION;  Surgeon: Reece Packer, MD;  Location: [place];  Service: Urology;  Laterality: N/A;   BIOPSY  02/24/2018   Procedure: BIOPSY;  Surgeon: Danie Binder, MD;  [facility];  Service: Endoscopy;;  colon   BIOPSY  06/01/2019   Procedure: BIOPSY;  Surgeon: Danie Binder, MD;  [facility];  Service: Endoscopy;;   BIOPSY  05/15/2020   Procedure: BIOPSY;  Surgeon: Eloise Harman, DO;  [facility];  Service: Endoscopy;;   BOWEL RESECTION N/A 07/28/2019   Procedure: LOW ANTERIOR BOWEL RESECTION;  Surgeon: Aviva Signs, MD;  [facility];  Service: General;  Laterality: N/A;   COLONOSCOPY WITH PROPOFOL N/A 02/24/2018   Dr. Oneida Alar: fungating, infiltrative partially obstructing mass in sigmoid colon, area tattooed. ext/int hemorrhoids. Bx: adenocarcinoma   COLONOSCOPY WITH PROPOFOL N/A 06/01/2019   Polypoid lesion at surgical anastomosis, s/p biopsy. Consistent with adenocarcinoma.    COLONOSCOPY WITH PROPOFOL N/A 05/15/2020   Procedure: COLONOSCOPY WITH PROPOFOL;  Surgeon: Eloise Harman, DO;  [facility];  Service: Endoscopy;  Laterality: N/A;  9:00am   CYSTOSCOPY WITH HOLMIUM LASER LITHOTRIPSY  08/07/2019   Procedure: CYSTOSCOPY WITH HOLMIUM LASER LITHOTRIPSY;  Surgeon: Festus Aloe, MD;  [facility];  Service: Urology;;   CYSTOSCOPY/RETROGRADE/URETEROSCOPY/STONE EXTRACTION WITH BASKET Left 08/07/2019  Procedure: CYSTOSCOPY/RETROGRADE/URETEROSCOPY/STONE EXTRACTION WITH BASKET;  Surgeon: Festus Aloe, MD;  [facility];  Service: Urology;  Laterality: Left;   ESOPHAGOGASTRODUODENOSCOPY (EGD) WITH PROPOFOL N/A 08/29/2015   Dr. Oneida Alar: possible proximal esophageal web s/p dilation, moderate gastritis, negative eosinophilic esophagitis    ESOPHAGOGASTRODUODENOSCOPY (EGD) WITH PROPOFOL  06/01/2019   normal esophagus ,normal stomach, normal duodenum.     OSTOMY     PARTIAL COLECTOMY N/A 03/02/2018   Procedure: PARTIAL COLECTOMY;  Surgeon: Aviva Signs, MD;  [facility];  Service: General;  Laterality: N/A;   PORTACATH PLACEMENT Left 04/13/2018   Procedure: INSERTION PORT-A-CATH;  Surgeon: Aviva Signs, MD;  [facility];  Service: General;  Laterality: Left;   SAVORY DILATION N/A 08/29/2015   Procedure: SAVORY DILATION;  Surgeon: Danie Binder, MD;  [facility];  Service: Endoscopy;  Laterality: N/A;   SUBMUCOSAL TATTOO INJECTION  05/15/2020   Procedure: SUBMUCOSAL TATTOO INJECTION;  Surgeon: Eloise Harman, DO;  [facility];  Service: Endoscopy;;   URETHRAL DILATION     WISDOM TOOTH EXTRACTION         Family History  Problem Relation Age of Onset   Dementia Mother    Dementia Maternal Aunt    Dementia Maternal Grandmother    COPD Maternal Grandfather    Breast cancer Maternal Aunt 42   Breast cancer Maternal Aunt        dx in her 40s   Lung cancer Maternal Aunt    Healthy Son    Colon cancer Neg Hx     Social History   Tobacco Use   Smoking status: Former    Packs/day: 0.25    Years: 9.00    Pack years: 2.25    Types: Cigarettes    Quit date: 07/27/2011    Years since quitting: 9.3   Smokeless tobacco: Never  Vaping Use   Vaping Use: Never used  Substance Use Topics   Alcohol use: Not Currently    Alcohol/week: 6.0 standard drinks    Types: 6 Cans of beer per week   Drug use: No    Home Medications Prior to Admission medications   Medication Sig Start Date End Date Taking? Authorizing Provider  CONTOUR NEXT TEST test strip USE TO CHECK BLOOD SUGAR BID 08/25/18   [provider]  empagliflozin (JARDIANCE) 25 MG TABS tablet Take 12.5 mg by mouth daily. 05/26/20   [provider]  glipiZIDE (GLUCOTROL XL) 5 MG 24 hr tablet Take two tablets daily until you see Dr. Hilma Favors Patient taking differently: 10 mg in the morning and at bedtime. 07/31/19   Aviva Signs, MD   losartan (COZAAR) 100 MG tablet Take 100 mg by mouth daily. 04/28/19   [provider]  magnesium oxide (MAG-OX) 400 MG tablet Take 400 mg by mouth daily.    [provider]  metFORMIN (GLUCOPHAGE) 500 MG tablet Take 500 mg by mouth 2 (two) times daily with a meal.    [provider]  Multiple Vitamin (MULTIVITAMIN) tablet Take 1 tablet by mouth daily.     [provider]  pantoprazole (PROTONIX) 40 MG tablet Take 40 mg by mouth daily.    [provider]  Potassium 99 MG TABS Take 99 mg by mouth daily.    [provider]  pravastatin (PRAVACHOL) 20 MG tablet Take 20 mg by mouth daily.  08/30/14   [provider]  topiramate (TOPAMAX) 100 MG tablet Take 100 mg by mouth daily.  [provider]    Allergies    Patient has no known allergies.  Review of Systems   Review of Systems  Gastrointestinal:  Positive for abdominal pain and diarrhea.  All other systems reviewed and are negative.  Physical Exam Updated Vital Signs BP (!) 135/96   Pulse 97   Temp 98.3 F (36.8 C) (Oral)   Resp 11   Ht '5\' 10"'$  (1.778 m)   Wt 95.7 kg   SpO2 97%   BMI 30.28 kg/m   Physical Exam Vitals reviewed.  HENT:     Head: Normocephalic.  Cardiovascular:     Rate and Rhythm: Normal rate and regular rhythm.  Pulmonary:     Effort: Pulmonary effort is normal.  Abdominal:     General: Abdomen is flat. Bowel sounds are increased.     Palpations: Abdomen is soft.  Skin:    General: Skin is warm.  Neurological:     General: No focal deficit present.     Mental Status: He is alert.    ED Results / Procedures / Treatments   Labs (all labs ordered are listed, but only abnormal results are displayed) Labs Reviewed  CBC WITH DIFFERENTIAL/PLATELET - Abnormal; Notable for the following components:      Result Value   Platelets 437 (*)    Neutro Abs 8.8 (*)    Lymphs Abs 0.6 (*)    All other components within normal limits   COMPREHENSIVE METABOLIC PANEL - Abnormal; Notable for the following components:   Sodium 131 (*)    Potassium 3.0 (*)    Chloride 87 (*)    Glucose, Bld 216 (*)    BUN 21 (*)    ALT 62 (*)    Anion gap 16 (*)    All other components within normal limits  RESP PANEL BY RT-PCR (FLU A&B, COVID) ARPGX2  MAGNESIUM    EKG None  Radiology No results found.  Procedures Procedures   Medications Ordered in ED Medications  potassium chloride 10 mEq in 100 mL IVPB (has no administration in time range)  sodium chloride 0.9 % bolus 1,000 mL (has no administration in time range)  potassium chloride SA (KLOR-CON) CR tablet 40 mEq (has no administration in time range)  sodium chloride 0.9 % bolus 1,000 mL (0 mLs Intravenous Stopped 11/25/20 1050)    ED Course  I have reviewed the triage vital signs and the nursing notes.  Pertinent labs & imaging results that were available during my care of the patient were reviewed by me and considered in my medical decision making (see chart for details).    MDM Rules/Calculators/A&P                           MDM:  Labs returned potassium is 3.0.  IV potassium ordered.  Iv fluids continued x 2 liters.   I spoke to the surgeon on call for pt's surgeon.  He advised lomotil and imodium, replace potassium.  He advised to have pt eat banana, drink tomato juice and gator aid.   Pt feels much better after fluids and potassium.  Pt has significantly decreased output.   Final Clinical Impression(s) / ED Diagnoses Final diagnoses:  Hypokalemia  Other complications of colostomy [facility])    Rx / DC Orders ED Discharge Orders     None        Sidney Ace 11/25/20 1909    Teressa Lower, MD 11/26/20  [address]    [place], [place] 11/27/20 [id number]    Teressa Lower, MD 11/27/20 [phone]

## 2021-05-21 ENCOUNTER — Telehealth (INDEPENDENT_AMBULATORY_CARE_PROVIDER_SITE_OTHER): Payer: Self-pay | Admitting: *Deleted

## 2021-05-21 NOTE — Telephone Encounter (Signed)
Spoke to pt, he informed me that he was waiting on a referral from the [place] office for a EGD. I informed him that I had not received referral yet. Will call and set up office visit when it arrives.

## 2021-05-21 NOTE — Telephone Encounter (Signed)
He is uncertain but her was supposed to have gotten a referral here with [place].    Can you look and see if we received one--either way can you call him and let him know   [phone]

## 2021-05-25 NOTE — Telephone Encounter (Signed)
Sending back to Reba. I have never seen this patient.

## 2021-05-28 NOTE — Telephone Encounter (Signed)
Noted, I think it was sent by mistake.

## 2021-05-28 NOTE — Telephone Encounter (Signed)
NotedLoma Jefferson, I will defer this to you as you handel referrals. Please discuss any concerns, if they arise, with Reba as this is not a current patient of ours.

## 2021-06-05 ENCOUNTER — Telehealth: Payer: Self-pay | Admitting: Internal Medicine

## 2021-06-05 ENCOUNTER — Encounter (HOSPITAL_COMMUNITY): Payer: Self-pay | Admitting: Hematology

## 2021-06-05 NOTE — Telephone Encounter (Signed)
Pt called asking to make an appointment and that he had spoke with the [facility] and they were going to send a referral. I told him he was last seen (virtual visit) with Roseanne Kaufman, NP in 03/2020 and his [place] expired in 09/2020. I told him I would need the VA referral and updated VA Authorization before making him an OV or he would be billed without it. Pt agreed. I told him I would call him back to schedule OV once referral arrives to [place].

## 2021-06-05 NOTE — Telephone Encounter (Signed)
Noted. Yes we will need a referral.

## 2021-06-20 IMAGING — DX DG CHEST 2V
2 series · 2 of 2 positions shown · non-contrast
Comparison: 06/16/2018

CLINICAL DATA: Hypertension, diabetes.  Preoperative exam.

EXAM:
CHEST - 2 VIEW

[chest pa]
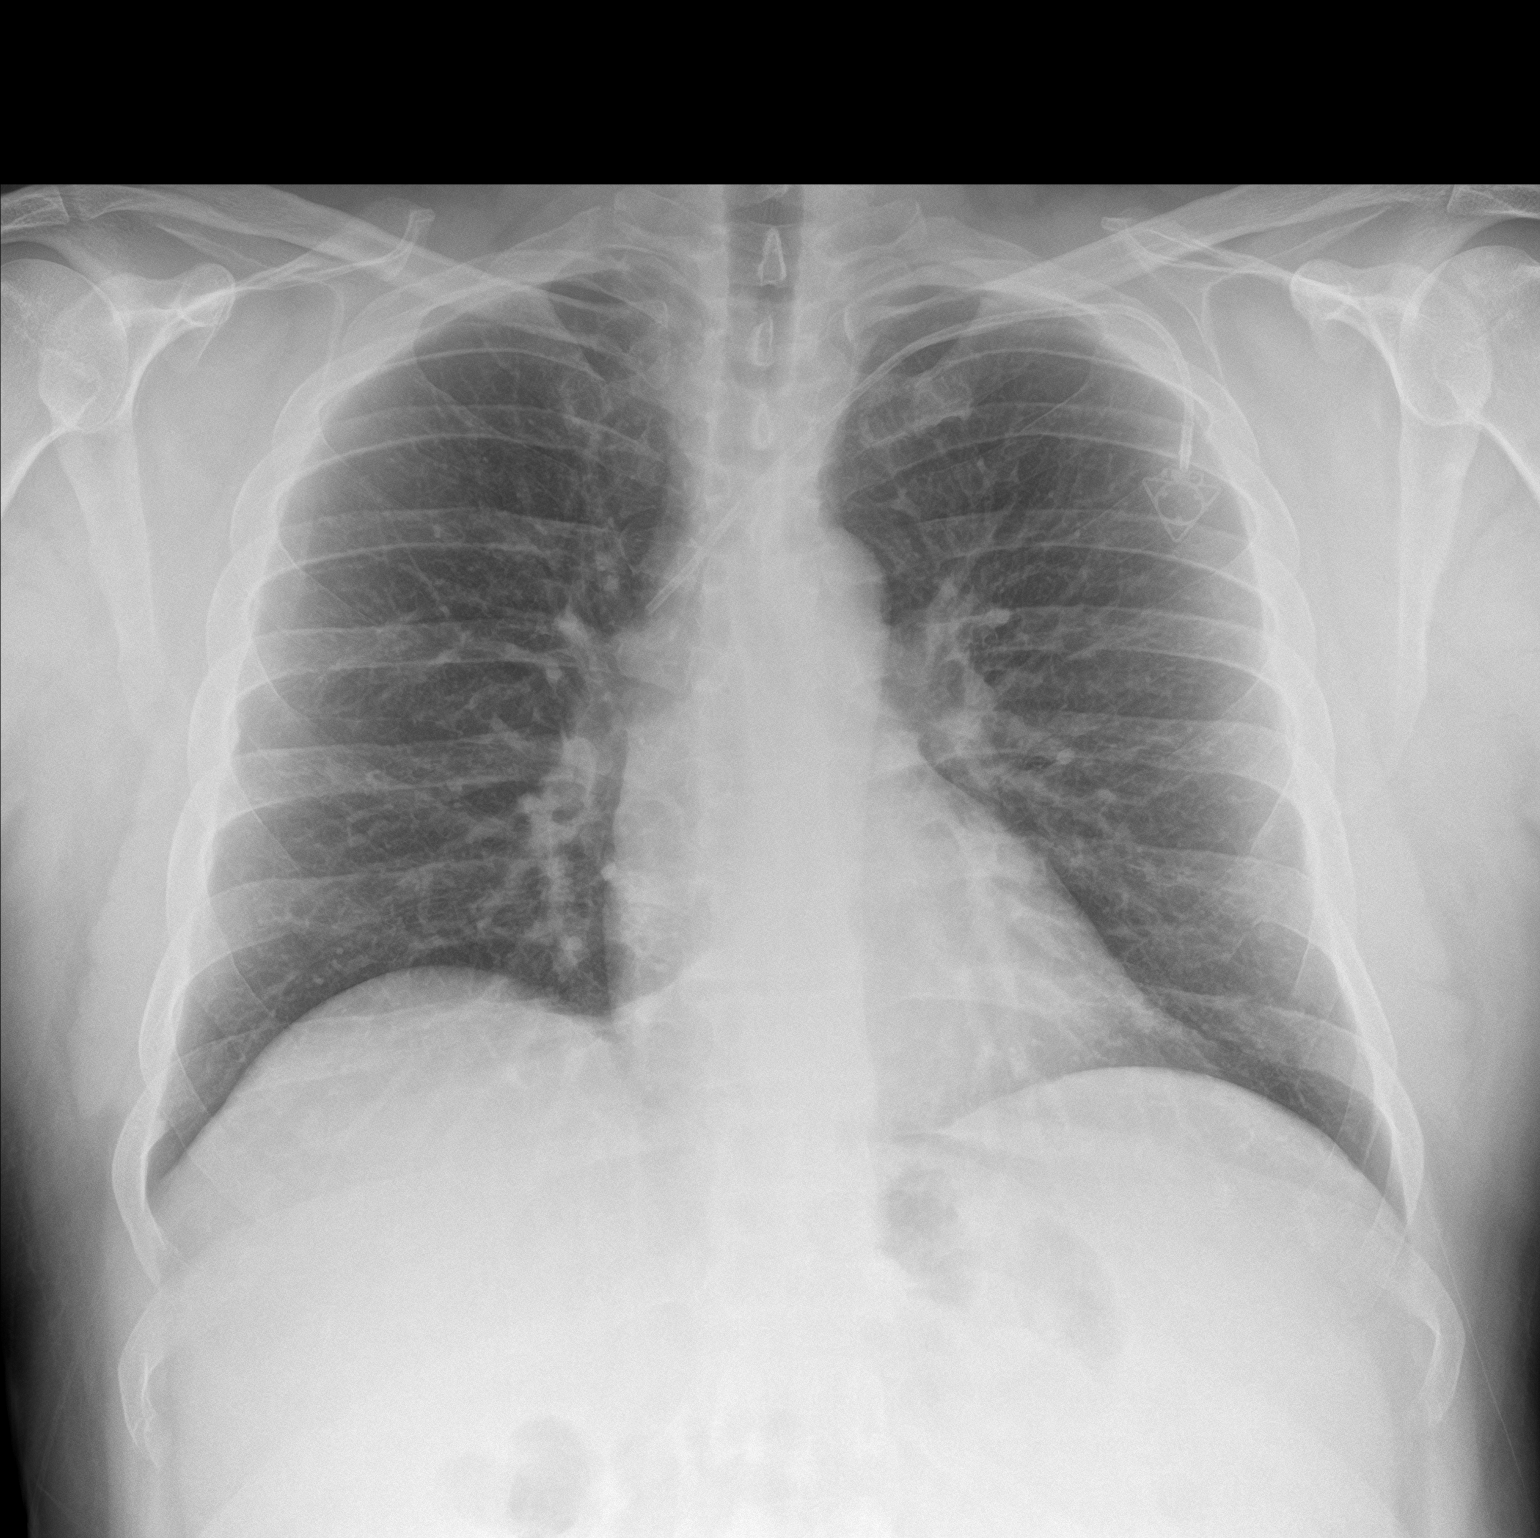

[chest lat]
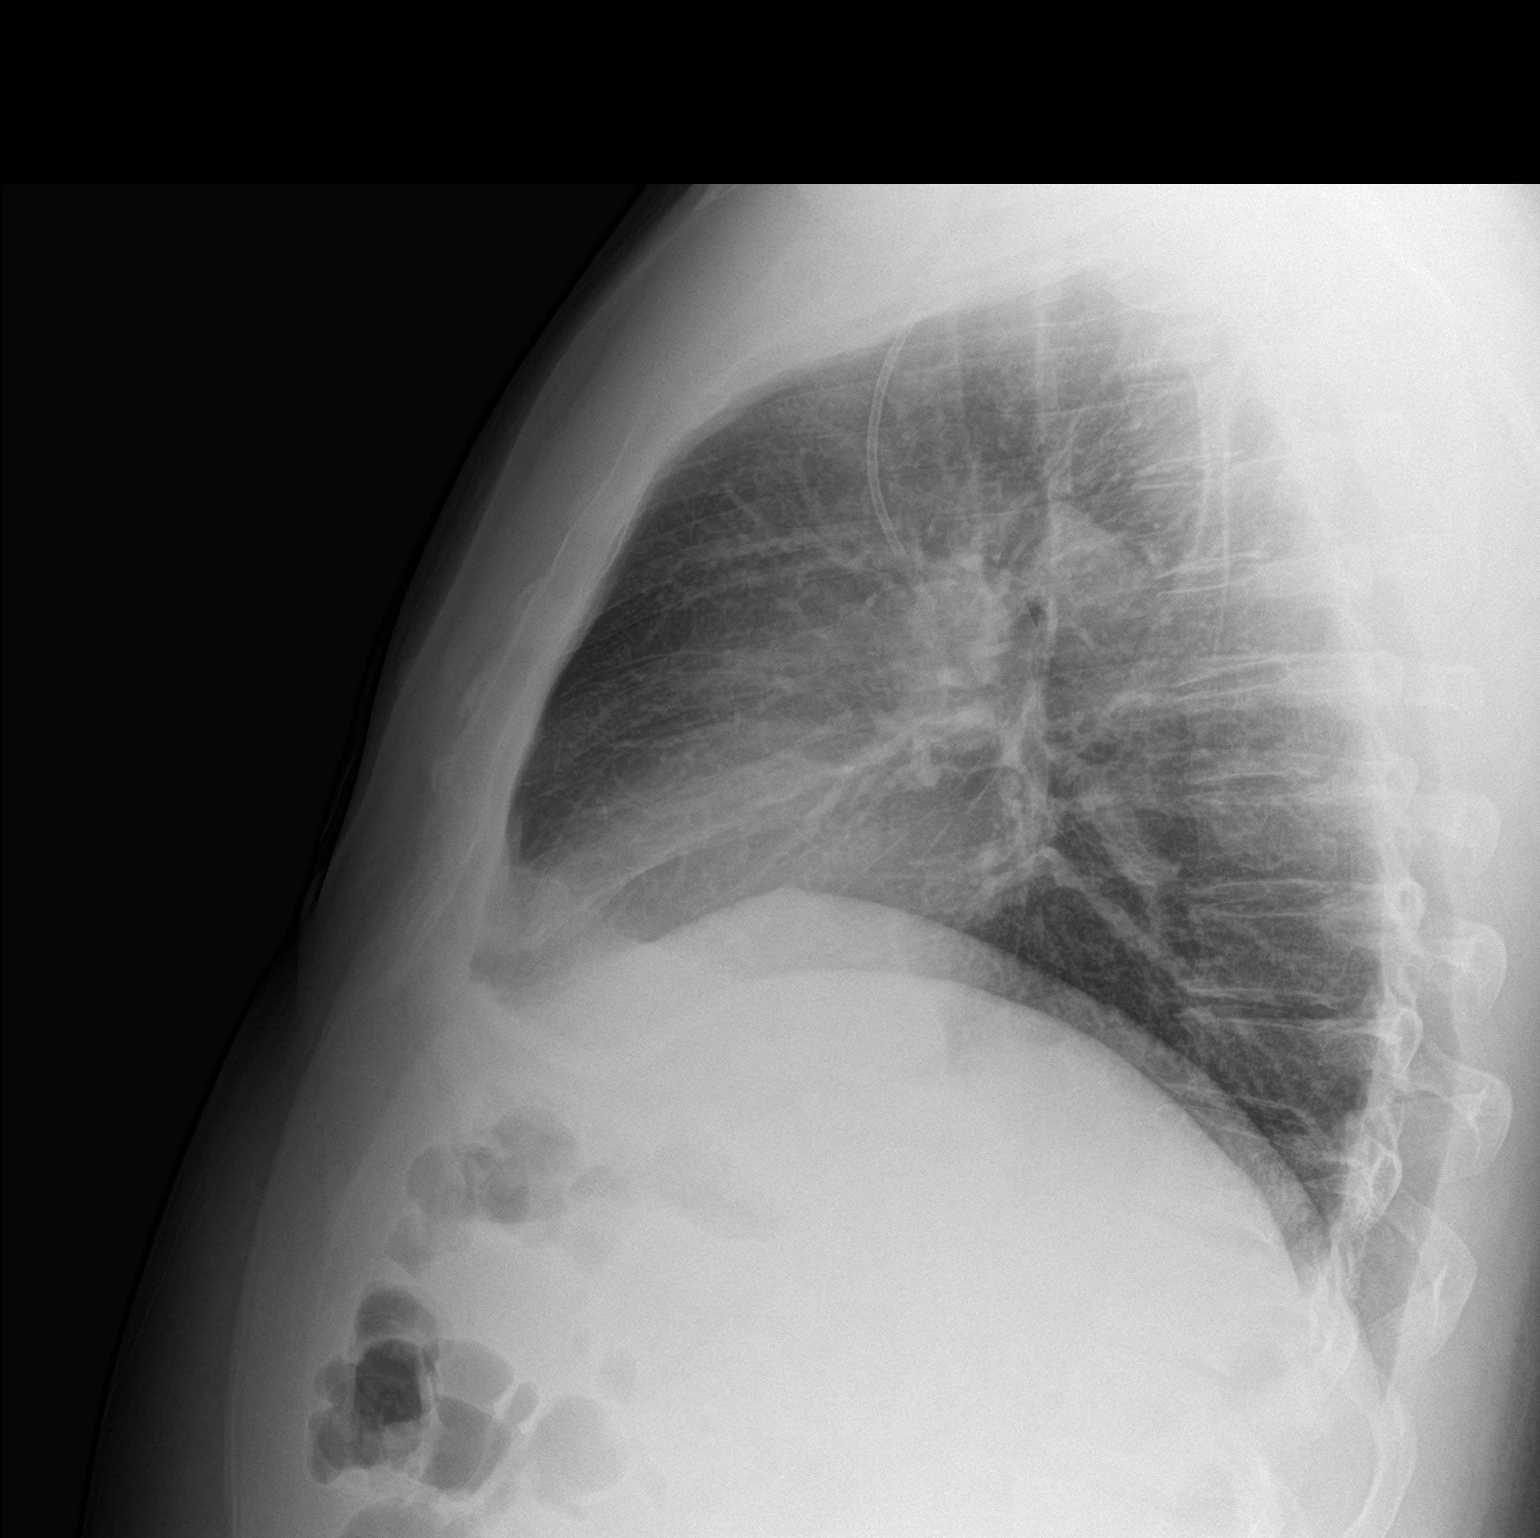

[2 of 2 positions shown; findings below may reference images not displayed]

FINDINGS: Left subclavian approach Port-A-Cath is unchanged in positioning.
The heart size and mediastinal contours are within normal limits.
Both lungs are clear. The visualized skeletal structures are
unremarkable.
IMPRESSION: No active cardiopulmonary disease.

## 2021-06-21 ENCOUNTER — Ambulatory Visit: Payer: No Typology Code available for payment source | Admitting: General Surgery

## 2021-07-01 NOTE — H&P (View-Only) (Signed)
? ? ? ?GI Office Note   ? ?Referring Provider: Sharilyn Sites, MD ?Primary Care Physician:  Sharilyn Sites, MD  ?Primary Gastroenterologist: Elon Alas. Abbey Chatters, DO ? ? ?Chief Complaint  ? ?Chief Complaint  ?Patient presents with  ? Dysphagia  ?  Less issues since starting Prilosec twice daily.   ? ? ? ?History of Present Illness  ? ?Joel Jefferson. is a 46 y.o. male presenting today for abnormal esophagus on CT, dysphagia.  ? ?H/o stage 3 sigmoid colon cancer in 02/2018, s/p resection and chemo. Recurrent colon cancer at the anastomosis in 05/2019, undergoing LAR by Dr. Arnoldo Morale 07/2019 and chemo June through [facility] 2021. In 05/2020 at surveillance colonoscopy he was found to have sigmoid colon adenocarcinoma just distal the anastomosis requiring XelodaRT followed by redo LAR, diverting loop ileostomy 11/2020. He underwent ileostomy reversal 03/08/21. Patient states he is advised to have next colonoscopy one year after his surgery in 11/2020. Plans to have this done at the [place]. ? ?Patient complains of dysphagia. Feels like food sticks. Has to wait until it passes in order to [place]. Chronically on PPI but for one month increased to BID. Some mild improvement in symptoms. Denies heartburn or vomiting. No weight loss. BM irregular. He can have some days with no stools. Most days has up to 3. Stools soft/loose. No melena. Has some hemorrhoids issues. Takes fibercon 1 per day. Takes lomotil only if needed. Sometimes has nocturnal stools especially if he eats too late. No weight loss.  ? ? ?CT chest/abd/pelvis: 05/2021: ?Postsurgical changes of colorectal surgery due to malignancy. There is small amount of soft tissue within the mesentery and presacral space surrounding anastomotic site which could be postsurgical changes. A soft tissue 0.8cm structure within the left subdiaphragmatic space may represents sequela of prior trauma/surgery or an area of fat infarct. No prior imaging for comparison. Attention on follow-up. No  additional findings to suggest distant metastatic disease.  ?Diffuse hepatic steatosis. Hepatosplenomegaly. ?Diffuse circumferential thickening of distal esophagus nonspecific but could reflux underlying esophagitis/GERD ? ? ? ?CT Chest/Abd/Pelvis 02/2021: ? 1. Postsurgical changes of low anterior resection with diverting right lower quadrant loop ileostomy. No evidence of residual disease limits evidence of metastatic disease chest, abdomen, or pelvis.  ?2.  Nonspecific fascial thickening fluid along the mesorectal fascia and presacral space which may be postsurgical in etiology. Attention on follow-up imaging.  ?3.  Nonspecific pericystic stranding and bladder wall thickening, which may relate to postsurgical changes. Recommend correlation with symptomatology and consideration of urinalysis as clinically indicated.  ?4.  Hepatic steatosis. ?5. Mediastinum/hila: Speckled soft tissue within the anterior mediastinum, likely residual thymus or rebound. Similar long segment circumferential wall thickening of the esophagus, similar dating back across multiple priors, possible related to chronic reflux esophagitis.  ? ?Colonoscopy 05/2020: ?- Non-bleeding internal hemorrhoids. ?- Patent end-to-end colo-colonic anastomosis. ?- Rule out malignancy, tumor in the sigmoid colon. Biopsied. Tattooed. ?-path showed adenocarcinoma in the sigmoid colon just distal the anastomosis. ? ?Labs 05/2021: iron 36, ferritin 7, TIBC 449, iron sat 8, cea 1.5, wbc 4500, Hgb 12.3, MCV 73.7, platelets 200000 ? ? ?Medications  ? ?Current Outpatient Medications  ?Medication Sig Dispense Refill  ? CONTOUR NEXT TEST test strip USE TO CHECK BLOOD SUGAR BID    ? diphenoxylate-atropine (LOMOTIL) 2.5-0.025 MG tablet Take 2 tablets by mouth 4 (four) times daily as needed for diarrhea or loose stools (may take one or two tablets every 6 hours if no relief from imodium). 45 tablet  0  ? empagliflozin (JARDIANCE) 25 MG TABS tablet Take 12.5 mg by mouth daily.  Takes 12.5 mg daily    ? ferrous sulfate 325 (65 FE) MG tablet Take 325 mg by mouth daily with breakfast.    ? glipiZIDE (GLUCOTROL XL) 5 MG 24 hr tablet Take two tablets daily until you see Dr. Hilma Favors (Patient taking differently: 10 mg in the morning and at bedtime.) 30 tablet 3  ? losartan (COZAAR) 100 MG tablet Take 100 mg by mouth daily.    ? magnesium oxide (MAG-OX) 400 MG tablet Take 400 mg by mouth daily.    ? metFORMIN (GLUCOPHAGE) 500 MG tablet Take 500 mg by mouth 2 (two) times daily with a meal.    ? Multiple Vitamin (MULTIVITAMIN) tablet Take 1 tablet by mouth daily.     ? Potassium 99 MG TABS Take 99 mg by mouth daily.    ? pravastatin (PRAVACHOL) 20 MG tablet Take 20 mg by mouth daily.   1  ? sildenafil (VIAGRA) 100 MG tablet TAKE ONE TABLET BY MOUTH AS INSTRUCTED (TAKE 1 HOUR PRIOR TO SEXUAL ACTIVITY *DO NOT EXCEED 1 DOSE PER 24 HOUR PERIOD*) PLEASE WATCH FOR SIDE EFFECTS AS DISCUSSED, DO NOT COMBINE IT WITH TAMSULOSIN, CONTRAINDICATED WITH NITRATES. STOP CIALIS/TADALAFIL.    ? tamsulosin (FLOMAX) 0.4 MG CAPS capsule TAKE ONE CAPSULE BY MOUTH AT BEDTIME PLEASE WATCH FOR DIZZINESS AND LOW BLOOD PRESSURE    ? ?No current facility-administered medications for this visit.  ? ? ?Allergies  ? ?Allergies as of 07/02/2021  ? (No Known Allergies)  ? ? ?Past Medical History  ? ?Past Medical History:  ?Diagnosis Date  ? Diabetes mellitus without complication ([place])   ? Family history of breast cancer   ? GERD (gastroesophageal reflux disease)   ? History of kidney stones   ? Hypercholesterolemia   ? Hypertension   ? Urethral stricture   ? ? ?Past Surgical History  ? ?Past Surgical History:  ?Procedure Laterality Date  ? BALLOON DILATION  04/06/2012  ? Procedure: BALLOON DILATION;  Surgeon: Joel Packer, MD;  Location: [facility];  Service: Urology;  Laterality: N/A;  ? BIOPSY  02/24/2018  ? Procedure: BIOPSY;  Surgeon: Joel Binder, MD;  [facility];  Service: Endoscopy;;   colon  ? BIOPSY  06/01/2019  ? Procedure: BIOPSY;  Surgeon: Joel Binder, MD;  [facility];  Service: Endoscopy;;  ? BIOPSY  05/15/2020  ? Procedure: BIOPSY;  Surgeon: Eloise Harman, DO;  [facility];  Service: Endoscopy;;  ? BOWEL RESECTION N/A 07/28/2019  ? Procedure: LOW ANTERIOR BOWEL RESECTION;  Surgeon: Aviva Signs, MD;  [facility];  Service: General;  Laterality: N/A;  ? COLONOSCOPY WITH PROPOFOL N/A 02/24/2018  ? Dr. Oneida Alar: fungating, infiltrative partially obstructing mass in sigmoid colon, area tattooed. ext/int hemorrhoids. Bx: adenocarcinoma  ? COLONOSCOPY WITH PROPOFOL N/A 06/01/2019  ? Polypoid lesion at surgical anastomosis, s/p biopsy. Consistent with adenocarcinoma.   ? COLONOSCOPY WITH PROPOFOL N/A 05/15/2020  ? Procedure: COLONOSCOPY WITH PROPOFOL;  Surgeon: Eloise Harman, DO;  [facility];  Service: Endoscopy;  Laterality: N/A;  9:00am  ? CYSTOSCOPY WITH HOLMIUM LASER LITHOTRIPSY  08/07/2019  ? Procedure: CYSTOSCOPY WITH HOLMIUM LASER LITHOTRIPSY;  Surgeon: Festus Aloe, MD;  [facility];  Service: Urology;;  ? CYSTOSCOPY/RETROGRADE/URETEROSCOPY/STONE EXTRACTION WITH BASKET Left 08/07/2019  ? Procedure: CYSTOSCOPY/RETROGRADE/URETEROSCOPY/STONE EXTRACTION WITH BASKET;  Surgeon: Festus Aloe, MD;  [facility];  Service: Urology;  Laterality: Left;  ? ESOPHAGOGASTRODUODENOSCOPY (EGD) WITH PROPOFOL N/A 08/29/2015  ? Dr. Oneida Alar: possible proximal esophageal web s/p dilation, moderate gastritis, negative eosinophilic esophagitis   ? ESOPHAGOGASTRODUODENOSCOPY (EGD) WITH PROPOFOL  06/01/2019  ? normal esophagus ,normal stomach, normal duodenum.   ? OSTOMY    ? PARTIAL COLECTOMY N/A 03/02/2018  ? Procedure: PARTIAL COLECTOMY;  Surgeon: Aviva Signs, MD;  [facility];  Service: General;  Laterality: N/A;  ? PORTACATH PLACEMENT Left 04/13/2018  ? Procedure: INSERTION PORT-A-CATH;  Surgeon: Aviva Signs, MD;  [facility];   Service: General;  Laterality: Left;  ? SAVORY DILATION N/A 08/29/2015  ? Procedure: SAVORY DILATION;  Surgeon: Joel Binder, MD;  [facility];  Service: Endoscopy;  Laterality: N/A;  ? SUB

## 2021-07-01 NOTE — Progress Notes (Signed)
? ? ? ?GI Office Note   ? ?Referring Provider: Sharilyn Sites, MD ?Primary Care Physician:  Sharilyn Sites, MD  ?Primary Gastroenterologist: Elon Alas. Abbey Chatters, DO ? ? ?Chief Complaint  ? ?Chief Complaint  ?Patient presents with  ? Dysphagia  ?  Less issues since starting Prilosec twice daily.   ? ? ? ?History of Present Illness  ? ?Joel Jefferson. is a 46 y.o. male presenting today for abnormal esophagus on CT, dysphagia.  ? ?H/o stage 3 sigmoid colon cancer in 02/2018, s/p resection and chemo. Recurrent colon cancer at the anastomosis in 05/2019, undergoing LAR by Dr. Arnoldo Morale 07/2019 and chemo June through [facility] 2021. In 05/2020 at surveillance colonoscopy he was found to have sigmoid colon adenocarcinoma just distal the anastomosis requiring XelodaRT followed by redo LAR, diverting loop ileostomy 11/2020. He underwent ileostomy reversal 03/08/21. Patient states he is advised to have next colonoscopy one year after his surgery in 11/2020. Plans to have this done at the [place]. ? ?Patient complains of dysphagia. Feels like food sticks. Has to wait until it passes in order to [place]. Chronically on PPI but for one month increased to BID. Some mild improvement in symptoms. Denies heartburn or vomiting. No weight loss. BM irregular. He can have some days with no stools. Most days has up to 3. Stools soft/loose. No melena. Has some hemorrhoids issues. Takes fibercon 1 per day. Takes lomotil only if needed. Sometimes has nocturnal stools especially if he eats too late. No weight loss.  ? ? ?CT chest/abd/pelvis: 05/2021: ?Postsurgical changes of colorectal surgery due to malignancy. There is small amount of soft tissue within the mesentery and presacral space surrounding anastomotic site which could be postsurgical changes. A soft tissue 0.8cm structure within the left subdiaphragmatic space may represents sequela of prior trauma/surgery or an area of fat infarct. No prior imaging for comparison. Attention on follow-up. No  additional findings to suggest distant metastatic disease.  ?Diffuse hepatic steatosis. Hepatosplenomegaly. ?Diffuse circumferential thickening of distal esophagus nonspecific but could reflux underlying esophagitis/GERD ? ? ? ?CT Chest/Abd/Pelvis 02/2021: ? 1. Postsurgical changes of low anterior resection with diverting right lower quadrant loop ileostomy. No evidence of residual disease limits evidence of metastatic disease chest, abdomen, or pelvis.  ?2.  Nonspecific fascial thickening fluid along the mesorectal fascia and presacral space which may be postsurgical in etiology. Attention on follow-up imaging.  ?3.  Nonspecific pericystic stranding and bladder wall thickening, which may relate to postsurgical changes. Recommend correlation with symptomatology and consideration of urinalysis as clinically indicated.  ?4.  Hepatic steatosis. ?5. Mediastinum/hila: Speckled soft tissue within the anterior mediastinum, likely residual thymus or rebound. Similar long segment circumferential wall thickening of the esophagus, similar dating back across multiple priors, possible related to chronic reflux esophagitis.  ? ?Colonoscopy 05/2020: ?- Non-bleeding internal hemorrhoids. ?- Patent end-to-end colo-colonic anastomosis. ?- Rule out malignancy, tumor in the sigmoid colon. Biopsied. Tattooed. ?-path showed adenocarcinoma in the sigmoid colon just distal the anastomosis. ? ?Labs 05/2021: iron 36, ferritin 7, TIBC 449, iron sat 8, cea 1.5, wbc 4500, Hgb 12.3, MCV 73.7, platelets 200000 ? ? ?Medications  ? ?Current Outpatient Medications  ?Medication Sig Dispense Refill  ? CONTOUR NEXT TEST test strip USE TO CHECK BLOOD SUGAR BID    ? diphenoxylate-atropine (LOMOTIL) 2.5-0.025 MG tablet Take 2 tablets by mouth 4 (four) times daily as needed for diarrhea or loose stools (may take one or two tablets every 6 hours if no relief from imodium). 45 tablet  0  ? empagliflozin (JARDIANCE) 25 MG TABS tablet Take 12.5 mg by mouth daily.  Takes 12.5 mg daily    ? ferrous sulfate 325 (65 FE) MG tablet Take 325 mg by mouth daily with breakfast.    ? glipiZIDE (GLUCOTROL XL) 5 MG 24 hr tablet Take two tablets daily until you see Dr. Hilma Favors (Patient taking differently: 10 mg in the morning and at bedtime.) 30 tablet 3  ? losartan (COZAAR) 100 MG tablet Take 100 mg by mouth daily.    ? magnesium oxide (MAG-OX) 400 MG tablet Take 400 mg by mouth daily.    ? metFORMIN (GLUCOPHAGE) 500 MG tablet Take 500 mg by mouth 2 (two) times daily with a meal.    ? Multiple Vitamin (MULTIVITAMIN) tablet Take 1 tablet by mouth daily.     ? Potassium 99 MG TABS Take 99 mg by mouth daily.    ? pravastatin (PRAVACHOL) 20 MG tablet Take 20 mg by mouth daily.   1  ? sildenafil (VIAGRA) 100 MG tablet TAKE ONE TABLET BY MOUTH AS INSTRUCTED (TAKE 1 HOUR PRIOR TO SEXUAL ACTIVITY *DO NOT EXCEED 1 DOSE PER 24 HOUR PERIOD*) PLEASE WATCH FOR SIDE EFFECTS AS DISCUSSED, DO NOT COMBINE IT WITH TAMSULOSIN, CONTRAINDICATED WITH NITRATES. STOP CIALIS/TADALAFIL.    ? tamsulosin (FLOMAX) 0.4 MG CAPS capsule TAKE ONE CAPSULE BY MOUTH AT BEDTIME PLEASE WATCH FOR DIZZINESS AND LOW BLOOD PRESSURE    ? ?No current facility-administered medications for this visit.  ? ? ?Allergies  ? ?Allergies as of 07/02/2021  ? (No Known Allergies)  ? ? ?Past Medical History  ? ?Past Medical History:  ?Diagnosis Date  ? Diabetes mellitus without complication ([place])   ? Family history of breast cancer   ? GERD (gastroesophageal reflux disease)   ? History of kidney stones   ? Hypercholesterolemia   ? Hypertension   ? Urethral stricture   ? ? ?Past Surgical History  ? ?Past Surgical History:  ?Procedure Laterality Date  ? BALLOON DILATION  04/06/2012  ? Procedure: BALLOON DILATION;  Surgeon: Reece Packer, MD;  [facility]: [facility];  Service: Urology;  Laterality: N/A;  ? BIOPSY  02/24/2018  ? Procedure: BIOPSY;  Surgeon: Danie Binder, MD;  [facility];  Service: Endoscopy;;   colon  ? BIOPSY  06/01/2019  ? Procedure: BIOPSY;  Surgeon: Danie Binder, MD;  [facility];  Service: Endoscopy;;  ? BIOPSY  05/15/2020  ? Procedure: BIOPSY;  Surgeon: Eloise Harman, DO;  Location: [facility];  Service: Endoscopy;;  ? BOWEL RESECTION N/A 07/28/2019  ? Procedure: LOW ANTERIOR BOWEL RESECTION;  Surgeon: Aviva Signs, MD;  Location: [facility];  Service: General;  Laterality: N/A;  ? COLONOSCOPY WITH PROPOFOL N/A 02/24/2018  ? Dr. Oneida Alar: fungating, infiltrative partially obstructing mass in sigmoid colon, area tattooed. ext/int hemorrhoids. Bx: adenocarcinoma  ? COLONOSCOPY WITH PROPOFOL N/A 06/01/2019  ? Polypoid lesion at surgical anastomosis, s/p biopsy. Consistent with adenocarcinoma.   ? COLONOSCOPY WITH PROPOFOL N/A 05/15/2020  ? Procedure: COLONOSCOPY WITH PROPOFOL;  Surgeon: Eloise Harman, DO;  Location: [facility];  Service: Endoscopy;  Laterality: N/A;  9:00am  ? CYSTOSCOPY WITH HOLMIUM LASER LITHOTRIPSY  08/07/2019  ? Procedure: CYSTOSCOPY WITH HOLMIUM LASER LITHOTRIPSY;  Surgeon: Festus Aloe, MD;  Location: [facility];  Service: Urology;;  ? CYSTOSCOPY/RETROGRADE/URETEROSCOPY/STONE EXTRACTION WITH BASKET Left 08/07/2019  ? Procedure: CYSTOSCOPY/RETROGRADE/URETEROSCOPY/STONE EXTRACTION WITH BASKET;  Surgeon: Festus Aloe, MD;  Location: [facility];  Service: Urology;  Laterality: Left;  ? ESOPHAGOGASTRODUODENOSCOPY (EGD) WITH PROPOFOL N/A 08/29/2015  ? Dr. Oneida Alar: possible proximal esophageal web s/p dilation, moderate gastritis, negative eosinophilic esophagitis   ? ESOPHAGOGASTRODUODENOSCOPY (EGD) WITH PROPOFOL  06/01/2019  ? normal esophagus ,normal stomach, normal duodenum.   ? OSTOMY    ? PARTIAL COLECTOMY N/A 03/02/2018  ? Procedure: PARTIAL COLECTOMY;  Surgeon: Aviva Signs, MD;  Location: [facility];  Service: General;  Laterality: N/A;  ? PORTACATH PLACEMENT Left 04/13/2018  ? Procedure: INSERTION PORT-A-CATH;  Surgeon: Aviva Signs, MD;  Location: [facility];   Service: General;  Laterality: Left;  ? SAVORY DILATION N/A 08/29/2015  ? Procedure: SAVORY DILATION;  Surgeon: Danie Binder, MD;  [facility];  Service: Endoscopy;  Laterality: N/A;  ? SUB

## 2021-07-02 ENCOUNTER — Encounter: Payer: Self-pay | Admitting: Gastroenterology

## 2021-07-02 ENCOUNTER — Other Ambulatory Visit: Payer: Self-pay

## 2021-07-02 ENCOUNTER — Ambulatory Visit (INDEPENDENT_AMBULATORY_CARE_PROVIDER_SITE_OTHER): Payer: No Typology Code available for payment source | Admitting: Gastroenterology

## 2021-07-02 VITALS — BP 128/78 | HR 80 | Temp 97.7°F | Ht 70.0 in | Wt 234.2 lb

## 2021-07-02 DIAGNOSIS — R1319 Other dysphagia: Secondary | ICD-10-CM | POA: Diagnosis not present

## 2021-07-02 DIAGNOSIS — R933 Abnormal findings on diagnostic imaging of other parts of digestive tract: Secondary | ICD-10-CM

## 2021-07-02 NOTE — Patient Instructions (Addendum)
Upper endoscopy to be scheduled. See separate instructions. ?Continue omeprazole twice daily before meals.  ?

## 2021-07-09 ENCOUNTER — Encounter: Payer: Self-pay | Admitting: Gastroenterology

## 2021-07-10 ENCOUNTER — Encounter (HOSPITAL_COMMUNITY)
Admission: RE | Disposition: A | Discharge status: Home / Self Care | Payer: Self-pay | Source: Home / Self Care | Attending: Internal Medicine

## 2021-07-10 ENCOUNTER — Ambulatory Visit (HOSPITAL_BASED_OUTPATIENT_CLINIC_OR_DEPARTMENT_OTHER): Payer: No Typology Code available for payment source | Admitting: Anesthesiology

## 2021-07-10 ENCOUNTER — Other Ambulatory Visit: Payer: Self-pay

## 2021-07-10 ENCOUNTER — Ambulatory Visit (HOSPITAL_COMMUNITY): Payer: No Typology Code available for payment source | Admitting: Anesthesiology

## 2021-07-10 ENCOUNTER — Encounter (HOSPITAL_COMMUNITY): Payer: Self-pay

## 2021-07-10 ENCOUNTER — Ambulatory Visit (HOSPITAL_COMMUNITY)
Admission: RE | Admit: 2021-07-10 | Discharge: 2021-07-10 | Disposition: A | Discharge status: Home / Self Care | Payer: No Typology Code available for payment source | Attending: Internal Medicine | Admitting: Internal Medicine

## 2021-07-10 DIAGNOSIS — R12 Heartburn: Secondary | ICD-10-CM

## 2021-07-10 DIAGNOSIS — R1310 Dysphagia, unspecified: Secondary | ICD-10-CM | POA: Insufficient documentation

## 2021-07-10 DIAGNOSIS — Z980 Intestinal bypass and anastomosis status: Secondary | ICD-10-CM | POA: Diagnosis not present

## 2021-07-10 DIAGNOSIS — K760 Fatty (change of) liver, not elsewhere classified: Secondary | ICD-10-CM | POA: Insufficient documentation

## 2021-07-10 DIAGNOSIS — Z9221 Personal history of antineoplastic chemotherapy: Secondary | ICD-10-CM | POA: Diagnosis not present

## 2021-07-10 DIAGNOSIS — Z85038 Personal history of other malignant neoplasm of large intestine: Secondary | ICD-10-CM | POA: Insufficient documentation

## 2021-07-10 DIAGNOSIS — Z87891 Personal history of nicotine dependence: Secondary | ICD-10-CM | POA: Diagnosis not present

## 2021-07-10 DIAGNOSIS — E11649 Type 2 diabetes mellitus with hypoglycemia without coma: Secondary | ICD-10-CM | POA: Diagnosis not present

## 2021-07-10 DIAGNOSIS — I10 Essential (primary) hypertension: Secondary | ICD-10-CM | POA: Diagnosis not present

## 2021-07-10 DIAGNOSIS — K219 Gastro-esophageal reflux disease without esophagitis: Secondary | ICD-10-CM | POA: Diagnosis not present

## 2021-07-10 HISTORY — PX: ESOPHAGOGASTRODUODENOSCOPY (EGD) WITH PROPOFOL: SHX5813

## 2021-07-10 HISTORY — PX: BALLOON DILATION: SHX5330

## 2021-07-10 LAB — GLUCOSE, CAPILLARY: Glucose-Capillary: 141 mg/dL — ABNORMAL HIGH (ref 70–99)

## 2021-07-10 SURGERY — ESOPHAGOGASTRODUODENOSCOPY (EGD) WITH PROPOFOL
Anesthesia: General

## 2021-07-10 MED ORDER — PROPOFOL 10 MG/ML IV BOLUS
INTRAVENOUS | Status: DC | PRN
  Administered 2021-07-10: 150 mg via INTRAVENOUS
  Administered 2021-07-10 (×3): 50 mg via INTRAVENOUS

## 2021-07-10 MED ORDER — LIDOCAINE HCL (CARDIAC) PF 100 MG/5ML IV SOSY
PREFILLED_SYRINGE | INTRAVENOUS | Status: DC | PRN
  Administered 2021-07-10: 50 mg via INTRAVENOUS

## 2021-07-10 MED ORDER — LACTATED RINGERS IV SOLN: INTRAVENOUS | Status: DC

## 2021-07-10 NOTE — Anesthesia Postprocedure Evaluation (Signed)
Anesthesia Post Note ? ?Patient: Kaicen Desena. ? ?Procedure(s) Performed: ESOPHAGOGASTRODUODENOSCOPY (EGD) WITH PROPOFOL ?BALLOON DILATION ? ?Patient location during evaluation: Endoscopy ?Anesthesia Type: General ?Level of consciousness: awake and alert and oriented ?Pain management: pain level controlled ?Vital Signs Assessment: post-procedure vital signs reviewed and stable ?Respiratory status: spontaneous breathing, nonlabored ventilation and respiratory function stable ?Cardiovascular status: blood pressure returned to baseline and stable ?Postop Assessment: no apparent nausea or vomiting ?Anesthetic complications: no ? ? ?No notable events documented. ? ? ?Last Vitals:  ?Vitals:  ? 07/10/21 1212 07/10/21 1248  ?BP: 129/87 (!) 101/56  ?Pulse: 75 87  ?Resp: 17 16  ?Temp: 37.1 ?C 36.8 ?C  ?SpO2: 95% 97%  ?  ?Last Pain:  ?Vitals:  ? 07/10/21 1248  ?TempSrc: Oral  ?PainSc: 0-No pain  ? ? ?  ?  ?  ?  ?  ?  ? ?Chayson Charters C Sherill Mangen ? ? ? ? ?

## 2021-07-10 NOTE — Op Note (Signed)
[facility] ?Patient Name: Joel Jefferson ?Procedure Date: 07/10/2021 11:30 AM ?MRN: [id number] ?Date of Birth: [birth year] ?Attending MD: Elon Alas. Abbey Chatters , DO ?CSN: [id number] ?Age: 46 ?Admit Type: Outpatient ?Procedure:                Upper GI endoscopy ?Indications:              Dysphagia, Heartburn ?Providers:                Elon Alas. Abbey Chatters, DO, Charlsie Quest. Theda Sers RN, Therapist, sports,  ?                          Aram Candela ?Referring MD:              ?Medicines:                See the Anesthesia note for documentation of the  ?                          administered medications ?Complications:            No immediate complications. ?Estimated Blood Loss:     Estimated blood loss: none. ?Procedure:                Pre-Anesthesia Assessment: ?                          - The anesthesia plan was to use monitored  ?                          anesthesia care (MAC). ?                          After obtaining informed consent, the endoscope was  ?                          passed under direct vision. Throughout the  ?                          procedure, the patient's blood pressure, pulse, and  ?                          oxygen saturations were monitored continuously. The  ?                          GIF-H190 ([id number]) scope was introduced through the  ?                          mouth, and advanced to the second part of duodenum.  ?                          The upper GI endoscopy was accomplished without  ?                          difficulty. The patient tolerated the procedure  ?                          well. ?Scope In: 12:38:33 PM ?Scope  Out: 12:44:39 PM ?Total Procedure Duration: 0 hours 6 minutes 6 seconds  ?Findings: ?     There is no endoscopic evidence of bleeding, areas of erosion,  ?     esophagitis, hiatal hernia, ulcerations or varices in the entire  ?     esophagus. ?     The Z-line was regular and was found 39 cm from the incisors. ?     No endoscopic abnormality was evident in the esophagus to explain the  ?      patient's complaint of dysphagia. LES did feel tight advancing scope,  ?     ?hypertensive LES. Preparations were made for empiric dilation. A TTS  ?     dilator was passed through the scope. Dilation with an 18-19-20 mm  ?     balloon dilator was performed to 20 mm. Dilation was performed with a  ?     mild resistance at 20 mm. Estimated blood loss was none. ?     The entire examined stomach was normal. ?     The duodenal bulb, first portion of the duodenum and second portion of  ?     the duodenum were normal. ?Impression:               - Z-line regular, 39 cm from the incisors. ?                          - Normal stomach. ?                          - Normal duodenal bulb, first portion of the  ?                          duodenum and second portion of the duodenum. ?                          - No specimens collected. ?Moderate Sedation: ?     Per Anesthesia Care ?Recommendation:           - Patient has a contact number available for  ?                          emergencies. The signs and symptoms of potential  ?                          delayed complications were discussed with the  ?                          patient. Return to normal activities tomorrow.  ?                          Written discharge instructions were provided to the  ?                          patient. ?                          - Resume previous diet. ?                          -  Continue present medications. ?                          - Use a proton pump inhibitor PO BID. ?                          - Consider esophageal manometry to rule out  ?                          dysmotility disorder ?                          - Return to GI clinic in 3 months. ?Procedure Code(s):        --- Professional --- ?                          [phone], Esophagogastroduodenoscopy, flexible,  ?                          transoral; diagnostic, including collection of  ?                          specimen(s) by brushing or washing, when performed  ?                           (separate procedure) ?Diagnosis Code(s):        --- Professional --- ?                          R13.10, Dysphagia, unspecified ?                          R12, Heartburn ?CPT copyright 2019 American Medical Association. All rights reserved. ?The codes documented in this report are preliminary and upon coder review may  ?be revised to meet current compliance requirements. ?Elon Alas. Abbey Chatters, DO ?Elon Alas. Pondera, DO ?07/10/2021 12:57:26 PM ?This report has been signed electronically. ?Number of Addenda: 0 ?

## 2021-07-10 NOTE — Anesthesia Preprocedure Evaluation (Addendum)
Anesthesia Evaluation  ?Patient identified by MRN, date of birth, ID band ?Patient awake ? ? ? ?Reviewed: ?Allergy & Precautions, NPO status , Patient's Chart, lab work & pertinent test results ? ?Airway ?Mallampati: II ? ?TM Distance: >3 FB ?Neck ROM: Full ? ? ? Dental ? ?(+) Dental Advisory Given, Missing, Poor Dentition ?  ?Pulmonary ?neg pulmonary ROS, former smoker,  ?  ?Pulmonary exam normal ?breath sounds clear to auscultation ? ? ? ? ? ? Cardiovascular ?Exercise Tolerance: Good ?hypertension, Pt. on medications ?Normal cardiovascular exam ?Rhythm:Regular Rate:Normal ? ? ?  ?Neuro/Psych ?negative neurological ROS ? negative psych ROS  ? GI/Hepatic ?GERD  Medicated and Controlled,(+)  ?  ? substance abuse ? alcohol use,   ?Endo/Other  ?diabetes, Well Controlled, Type 2, Oral Hypoglycemic Agents ? Renal/GU ?negative Renal ROS  ?negative genitourinary ?  ?Musculoskeletal ?negative musculoskeletal ROS ?(+)  ? Abdominal ?  ?Peds ?negative pediatric ROS ?(+)  Hematology ?negative hematology ROS ?(+)   ?Anesthesia Other Findings ? ? Reproductive/Obstetrics ?negative OB ROS ? ?  ? ? ? ? ? ? ? ? ? ? ? ? ? ?  ?  ? ? ? ? ? ? ? ?Anesthesia Physical ?Anesthesia Plan ? ?ASA: 2 ? ?Anesthesia Plan: General  ? ?Post-op Pain Management: Minimal or no pain anticipated  ? ?Induction: Intravenous ? ?PONV Risk Score and Plan: TIVA ? ?Airway Management Planned: Nasal Cannula and Natural Airway ? ?Additional Equipment:  ? ?Intra-op Plan:  ? ?Post-operative Plan:  ? ?Informed Consent: I have reviewed the patients History and Physical, chart, labs and discussed the procedure including the risks, benefits and alternatives for the proposed anesthesia with the patient or authorized representative who has indicated his/her understanding and acceptance.  ? ? ? ?Dental advisory given ? ?Plan Discussed with: CRNA and Surgeon ? ?Anesthesia Plan Comments:   ? ? ? ? ? ?Anesthesia Quick Evaluation ? ?

## 2021-07-10 NOTE — Anesthesia Procedure Notes (Signed)
Date/Time: 07/10/2021 12:42 PM ?Performed by: Orlie Dakin, CRNA ?Pre-anesthesia Checklist: Patient identified, Emergency Drugs available, Suction available and Patient being monitored ?Patient Re-evaluated:Patient Re-evaluated prior to induction ?Oxygen Delivery Method: Nasal cannula ?Induction Type: IV induction ?Placement Confirmation: positive ETCO2 ? ? ? ? ?

## 2021-07-10 NOTE — Interval H&P Note (Signed)
History and Physical Interval Note: ? ?07/10/2021 ?12:27 PM ? ?Joel Jefferson.  has presented today for surgery, with the diagnosis of dysphagia, abnormal esophagus on CT.  The various methods of treatment have been discussed with the patient and family. After consideration of risks, benefits and other options for treatment, the patient has consented to  Procedure(s) with comments: ?ESOPHAGOGASTRODUODENOSCOPY (EGD) WITH PROPOFOL (N/A) - 1:30pm ?BALLOON DILATION (N/A) as a surgical intervention.  The patient's history has been reviewed, patient examined, no change in status, stable for surgery.  I have reviewed the patient's chart and labs.  Questions were answered to the patient's satisfaction.   ? ? ?Eloise Harman ? ? ?

## 2021-07-10 NOTE — Discharge Instructions (Signed)
EGD ?Discharge instructions ?Please read the instructions outlined below and refer to this sheet in the next few weeks. These discharge instructions provide you with general information on caring for yourself after you leave the hospital. Your doctor may also give you specific instructions. While your treatment has been planned according to the most current medical practices available, unavoidable complications occasionally occur. If you have any problems or questions after discharge, please call your doctor. ?ACTIVITY ?You may resume your regular activity but move at a slower pace for the next 24 hours.  ?Take frequent rest periods for the next 24 hours.  ?Walking will help expel (get rid of) the air and reduce the bloated feeling in your abdomen.  ?No driving for 24 hours (because of the anesthesia (medicine) used during the test).  ?You may shower.  ?Do not sign any important legal documents or operate any machinery for 24 hours (because of the anesthesia used during the test).  ?NUTRITION ?Drink plenty of fluids.  ?You may resume your normal diet.  ?Begin with a light meal and progress to your normal diet.  ?Avoid alcoholic beverages for 24 hours or as instructed by your caregiver.  ?MEDICATIONS ?You may resume your normal medications unless your caregiver tells you otherwise.  ?WHAT YOU CAN EXPECT TODAY ?You may experience abdominal discomfort such as a feeling of fullness or ?gas? pains.  ?FOLLOW-UP ?Your doctor will discuss the results of your test with you.  ?SEEK IMMEDIATE MEDICAL ATTENTION IF ANY OF THE FOLLOWING OCCUR: ?Excessive nausea (feeling sick to your stomach) and/or vomiting.  ?Severe abdominal pain and distention (swelling).  ?Trouble swallowing.  ?Temperature over 101? F (37.8? C).  ?Rectal bleeding or vomiting of blood.  ? ?Your stomach and small bowel both look very healthy.  Your esophagus appeared healthy as well, I did not see any active inflammation or anatomical problem such as hiatal  hernia.  The lower portion of your esophagus did feel somewhat tight which could be an indication of a motility disorder.  Continue on omeprazole twice daily.  We may consider esophageal manometry to further evaluate. ? ? ?I hope you have a great rest of your week! ? ?Elon Alas. Abbey Chatters, D.O. ?Gastroenterology and Hepatology ?[facility] ? ?

## 2021-07-10 NOTE — Transfer of Care (Signed)
Immediate Anesthesia Transfer of Care Note ? ?Patient: Joel Jefferson. ? ?Procedure(s) Performed: ESOPHAGOGASTRODUODENOSCOPY (EGD) WITH PROPOFOL ?BALLOON DILATION ? ?Patient Location: Endoscopy Unit ? ?Anesthesia Type:General ? ?Level of Consciousness: awake and oriented ? ?Airway & Oxygen Therapy: Patient Spontanous Breathing ? ?Post-op Assessment: Report given to RN and Post -op Vital signs reviewed and stable ? ?Post vital signs: Reviewed and stable ? ?Last Vitals:  ?Vitals Value Taken Time  ?BP    ?Temp    ?Pulse    ?Resp    ?SpO2    ? ? ?Last Pain:  ?Vitals:  ? 07/10/21 1234  ?TempSrc:   ?PainSc: 0-No pain  ?   ? ?Patients Stated Pain Goal: 6 (07/10/21 1212) ? ?Complications: No notable events documented. ?

## 2021-07-16 ENCOUNTER — Encounter (HOSPITAL_COMMUNITY): Payer: Self-pay | Admitting: Internal Medicine

## 2021-09-15 ENCOUNTER — Other Ambulatory Visit: Payer: Self-pay | Admitting: Nurse Practitioner

## 2021-10-02 ENCOUNTER — Encounter: Payer: Self-pay | Admitting: Internal Medicine

## 2021-10-31 ENCOUNTER — Telehealth: Payer: Self-pay | Admitting: Physician Assistant

## 2021-10-31 DIAGNOSIS — H60332 Swimmer's ear, left ear: Secondary | ICD-10-CM

## 2021-10-31 MED ORDER — NEOMYCIN-POLYMYXIN-HC 3.5-10000-1 OT SOLN: 3.0000 [drp] | Freq: Four times a day (QID) | OTIC | 0 refills | Status: AC

## 2021-10-31 NOTE — Progress Notes (Signed)
E Visit for Swimmer's Ear  We are sorry that you are not feeling well. Here is how we plan to help!  Based on what you have shared with me it looks like you have swimmers ear. Swimmer's ear is a redness or swelling, irritation, or infection of your outer ear canal.  These symptoms usually occur within a few days of swimming.  Your ear canal is a tube that goes from the opening of the ear to the eardrum.  When water stays in your ear canal, germs can grow.  This is a painful condition that often happens to children and swimmers of all ages.  It is not contagious and oral antibiotics are not required to treat uncomplicated swimmer's ear.  The usual symptoms include: Itching inside the ear, Redness or a sense of swelling in the ear, Pain when the ear is tugged on when pressure is placed on the ear, Pus draining from the infected ear. and I have prescribed: Neomycin 0.35%, polymyxin B 10,000 units/mL, and hydrocortisone 0,5% otic solution 4 drops in affected ears four times a day for 7 days   In certain cases swimmer's ear may progress to a more serious bacterial infection of the middle or inner ear.  If you have a fever 102 and up and significantly worsening symptoms, this could indicate a more serious infection moving to the middle/inner and needs face to face evaluation in an office by a provider.  Your symptoms should improve over the next 3 days and should resolve in about 7 days.  HOME CARE:  Wash your hands frequently. Do not place the tip of the bottle on your ear or touch it with your fingers. You can take Acetominophen 650 mg every 4-6 hours as needed for pain.  If pain is severe or moderate, you can apply a heating pad (set on low) or hot water bottle (wrapped in a towel) to outer ear for 20 minutes.  This will also increase drainage. Avoid ear plugs Do not use Q-tips After showers, help the water run out by tilting your head to one side.  GET HELP RIGHT AWAY IF:  Fever is over 102.2  degrees. You develop progressive ear pain or hearing loss. Ear symptoms persist longer than 3 days after treatment.  MAKE SURE YOU:  Understand these instructions. Will watch your condition. Will get help right away if you are not doing well or get worse.  TO PREVENT SWIMMER'S EAR: Use a bathing cap or custom fitted swim molds to keep your ears dry. Towel off after swimming to dry your ears. Tilt your head or pull your earlobes to allow the water to escape your ear canal. If there is still water in your ears, consider using a hairdryer on the lowest setting.   Thank you for choosing an e-visit.  Your e-visit answers were reviewed by a board certified advanced clinical practitioner to complete your personal care plan. Depending upon the condition, your plan could have included both over the counter or prescription medications.  Please review your pharmacy choice. Make sure the pharmacy is open so you can pick up prescription now. If there is a problem, you may contact your provider through [employer] and have the prescription routed to another pharmacy.  Your safety is important to [place]. If you have drug allergies check your prescription carefully.   For the next 24 hours you can use MyChart to ask questions about today's visit, request a non-urgent call back, or ask for a work  or school excuse. You will get an email in the next two days asking about your experience. I hope that your e-visit has been valuable and will speed your recovery.

## 2021-10-31 NOTE — Progress Notes (Signed)
I have spent 5 minutes in review of e-visit questionnaire, review and updating patient chart, medical decision making and response to patient.   Jaquarious Cody Shaquoia Miers, PA-C
